# Patient Record
Sex: Female | Born: 1975 | Race: White | Hispanic: No | Marital: Single | Smoking: Former smoker
Health system: Southern US, Community
[De-identification: ages and names within clinical notes are randomized; demographics above are authoritative.]

## PROBLEM LIST (undated history)

## (undated) ENCOUNTER — Inpatient Hospital Stay (HOSPITAL_COMMUNITY): Payer: Self-pay

## (undated) DIAGNOSIS — N9989 Other postprocedural complications and disorders of genitourinary system: Secondary | ICD-10-CM

## (undated) DIAGNOSIS — J189 Pneumonia, unspecified organism: Secondary | ICD-10-CM

## (undated) DIAGNOSIS — E785 Hyperlipidemia, unspecified: Secondary | ICD-10-CM

## (undated) DIAGNOSIS — T8859XA Other complications of anesthesia, initial encounter: Secondary | ICD-10-CM

## (undated) DIAGNOSIS — F319 Bipolar disorder, unspecified: Secondary | ICD-10-CM

## (undated) DIAGNOSIS — F329 Major depressive disorder, single episode, unspecified: Secondary | ICD-10-CM

## (undated) DIAGNOSIS — D649 Anemia, unspecified: Secondary | ICD-10-CM

## (undated) DIAGNOSIS — F32A Depression, unspecified: Secondary | ICD-10-CM

## (undated) DIAGNOSIS — M1990 Unspecified osteoarthritis, unspecified site: Secondary | ICD-10-CM

## (undated) DIAGNOSIS — R338 Other retention of urine: Secondary | ICD-10-CM

## (undated) DIAGNOSIS — E669 Obesity, unspecified: Secondary | ICD-10-CM

## (undated) DIAGNOSIS — Z8719 Personal history of other diseases of the digestive system: Secondary | ICD-10-CM

## (undated) DIAGNOSIS — F419 Anxiety disorder, unspecified: Secondary | ICD-10-CM

## (undated) DIAGNOSIS — I10 Essential (primary) hypertension: Secondary | ICD-10-CM

## (undated) DIAGNOSIS — Z8489 Family history of other specified conditions: Secondary | ICD-10-CM

## (undated) DIAGNOSIS — I890 Lymphedema, not elsewhere classified: Secondary | ICD-10-CM

## (undated) DIAGNOSIS — R87629 Unspecified abnormal cytological findings in specimens from vagina: Secondary | ICD-10-CM

## (undated) DIAGNOSIS — O021 Missed abortion: Secondary | ICD-10-CM

## (undated) DIAGNOSIS — Z8619 Personal history of other infectious and parasitic diseases: Secondary | ICD-10-CM

## (undated) DIAGNOSIS — K219 Gastro-esophageal reflux disease without esophagitis: Secondary | ICD-10-CM

## (undated) DIAGNOSIS — G4730 Sleep apnea, unspecified: Secondary | ICD-10-CM

## (undated) DIAGNOSIS — R519 Headache, unspecified: Secondary | ICD-10-CM

## (undated) DIAGNOSIS — Z332 Encounter for elective termination of pregnancy: Secondary | ICD-10-CM

## (undated) DIAGNOSIS — T7840XA Allergy, unspecified, initial encounter: Secondary | ICD-10-CM

## (undated) HISTORY — DX: Depression, unspecified: F32.A

## (undated) HISTORY — DX: Hyperlipidemia, unspecified: E78.5

## (undated) HISTORY — DX: Anxiety disorder, unspecified: F41.9

## (undated) HISTORY — DX: Bipolar disorder, unspecified: F31.9

## (undated) HISTORY — PX: WISDOM TOOTH EXTRACTION: SHX21

## (undated) HISTORY — DX: Major depressive disorder, single episode, unspecified: F32.9

## (undated) HISTORY — DX: Allergy, unspecified, initial encounter: T78.40XA

## (undated) HISTORY — DX: Sleep apnea, unspecified: G47.30

## (undated) HISTORY — PX: DILATION AND CURETTAGE OF UTERUS: SHX78

## (undated) HISTORY — DX: Headache, unspecified: R51.9

---

## 2003-02-10 ENCOUNTER — Emergency Department (HOSPITAL_COMMUNITY): Admission: EM | Admit: 2003-02-10 | Discharge: 2003-02-10 | Payer: Self-pay | Admitting: Emergency Medicine

## 2003-03-23 ENCOUNTER — Ambulatory Visit (HOSPITAL_COMMUNITY): Admission: RE | Admit: 2003-03-23 | Discharge: 2003-03-23 | Payer: Self-pay | Admitting: Obstetrics and Gynecology

## 2003-03-23 ENCOUNTER — Encounter (INDEPENDENT_AMBULATORY_CARE_PROVIDER_SITE_OTHER): Payer: Self-pay | Admitting: *Deleted

## 2003-04-08 ENCOUNTER — Emergency Department (HOSPITAL_COMMUNITY): Admission: EM | Admit: 2003-04-08 | Discharge: 2003-04-08 | Payer: Self-pay | Admitting: Emergency Medicine

## 2003-06-08 ENCOUNTER — Other Ambulatory Visit: Admission: RE | Admit: 2003-06-08 | Discharge: 2003-06-08 | Payer: Self-pay | Admitting: Obstetrics and Gynecology

## 2003-07-23 ENCOUNTER — Other Ambulatory Visit: Admission: RE | Admit: 2003-07-23 | Discharge: 2003-07-23 | Payer: Self-pay | Admitting: Obstetrics and Gynecology

## 2003-10-23 ENCOUNTER — Other Ambulatory Visit: Admission: RE | Admit: 2003-10-23 | Discharge: 2003-10-23 | Payer: Self-pay | Admitting: Obstetrics and Gynecology

## 2004-03-22 ENCOUNTER — Encounter (INDEPENDENT_AMBULATORY_CARE_PROVIDER_SITE_OTHER): Payer: Self-pay | Admitting: *Deleted

## 2004-03-22 ENCOUNTER — Ambulatory Visit (HOSPITAL_COMMUNITY): Admission: RE | Admit: 2004-03-22 | Discharge: 2004-03-22 | Payer: Self-pay | Admitting: Obstetrics and Gynecology

## 2004-03-22 DIAGNOSIS — Z332 Encounter for elective termination of pregnancy: Secondary | ICD-10-CM

## 2004-03-22 HISTORY — DX: Encounter for elective termination of pregnancy: Z33.2

## 2004-08-09 ENCOUNTER — Emergency Department (HOSPITAL_COMMUNITY): Admission: EM | Admit: 2004-08-09 | Discharge: 2004-08-09 | Payer: Self-pay | Admitting: Emergency Medicine

## 2004-08-11 ENCOUNTER — Ambulatory Visit (HOSPITAL_COMMUNITY): Admission: RE | Admit: 2004-08-11 | Discharge: 2004-08-11 | Payer: Self-pay

## 2004-08-14 ENCOUNTER — Ambulatory Visit (HOSPITAL_COMMUNITY): Admission: RE | Admit: 2004-08-14 | Discharge: 2004-08-14 | Payer: Self-pay | Admitting: Neurology

## 2004-08-19 ENCOUNTER — Ambulatory Visit (HOSPITAL_COMMUNITY): Admission: RE | Admit: 2004-08-19 | Discharge: 2004-08-19 | Payer: Self-pay | Admitting: Neurology

## 2004-08-20 ENCOUNTER — Ambulatory Visit (HOSPITAL_COMMUNITY): Admission: RE | Admit: 2004-08-20 | Discharge: 2004-08-20 | Payer: Self-pay | Admitting: Neurology

## 2005-03-20 ENCOUNTER — Ambulatory Visit (HOSPITAL_COMMUNITY): Admission: RE | Admit: 2005-03-20 | Discharge: 2005-03-20 | Payer: Self-pay | Admitting: Neurology

## 2005-04-14 ENCOUNTER — Encounter: Admission: RE | Admit: 2005-04-14 | Discharge: 2005-04-14 | Payer: Self-pay | Admitting: Neurology

## 2005-10-15 ENCOUNTER — Inpatient Hospital Stay (HOSPITAL_COMMUNITY): Admission: AD | Admit: 2005-10-15 | Discharge: 2005-10-15 | Payer: Self-pay | Admitting: Obstetrics and Gynecology

## 2006-04-06 ENCOUNTER — Emergency Department (HOSPITAL_COMMUNITY): Admission: EM | Admit: 2006-04-06 | Discharge: 2006-04-06 | Payer: Self-pay | Admitting: Emergency Medicine

## 2006-08-26 ENCOUNTER — Ambulatory Visit: Payer: Self-pay | Admitting: Family Medicine

## 2006-10-04 ENCOUNTER — Ambulatory Visit: Payer: Self-pay | Admitting: Family Medicine

## 2007-04-25 ENCOUNTER — Emergency Department (HOSPITAL_COMMUNITY): Admission: EM | Admit: 2007-04-25 | Discharge: 2007-04-26 | Payer: Self-pay | Admitting: Emergency Medicine

## 2008-05-01 ENCOUNTER — Encounter: Admission: RE | Admit: 2008-05-01 | Discharge: 2008-05-01 | Payer: Self-pay | Admitting: Obstetrics and Gynecology

## 2008-08-01 ENCOUNTER — Encounter: Admission: RE | Admit: 2008-08-01 | Discharge: 2008-10-30 | Payer: Self-pay | Admitting: Family Medicine

## 2008-11-13 ENCOUNTER — Encounter: Admission: RE | Admit: 2008-11-13 | Discharge: 2008-11-13 | Payer: Self-pay | Admitting: Family Medicine

## 2009-02-13 ENCOUNTER — Ambulatory Visit (HOSPITAL_COMMUNITY): Admission: RE | Admit: 2009-02-13 | Discharge: 2009-02-13 | Payer: Self-pay | Admitting: Obstetrics and Gynecology

## 2009-02-13 ENCOUNTER — Encounter (INDEPENDENT_AMBULATORY_CARE_PROVIDER_SITE_OTHER): Payer: Self-pay | Admitting: Obstetrics and Gynecology

## 2009-06-14 ENCOUNTER — Emergency Department (HOSPITAL_COMMUNITY): Admission: EM | Admit: 2009-06-14 | Discharge: 2009-06-14 | Payer: Self-pay | Admitting: Emergency Medicine

## 2009-10-29 ENCOUNTER — Ambulatory Visit (HOSPITAL_COMMUNITY): Admission: RE | Admit: 2009-10-29 | Discharge: 2009-10-29 | Payer: Self-pay | Admitting: Obstetrics and Gynecology

## 2009-11-16 ENCOUNTER — Emergency Department (HOSPITAL_COMMUNITY): Admission: EM | Admit: 2009-11-16 | Discharge: 2009-11-16 | Payer: Self-pay | Admitting: Emergency Medicine

## 2009-11-26 ENCOUNTER — Ambulatory Visit (HOSPITAL_COMMUNITY)
Admission: RE | Admit: 2009-11-26 | Discharge: 2009-11-26 | Payer: Self-pay | Source: Ambulatory Visit | Admitting: Obstetrics and Gynecology

## 2010-01-02 ENCOUNTER — Ambulatory Visit: Payer: Self-pay | Admitting: Internal Medicine

## 2010-01-02 ENCOUNTER — Ambulatory Visit: Payer: Self-pay | Admitting: Pulmonary Disease

## 2010-01-02 DIAGNOSIS — R05 Cough: Secondary | ICD-10-CM

## 2010-01-02 DIAGNOSIS — R059 Cough, unspecified: Secondary | ICD-10-CM | POA: Insufficient documentation

## 2010-01-10 ENCOUNTER — Telehealth (INDEPENDENT_AMBULATORY_CARE_PROVIDER_SITE_OTHER): Payer: Self-pay | Admitting: *Deleted

## 2010-01-12 ENCOUNTER — Inpatient Hospital Stay (HOSPITAL_COMMUNITY): Admission: AD | Admit: 2010-01-12 | Discharge: 2010-01-13 | Payer: Self-pay | Admitting: Obstetrics & Gynecology

## 2010-01-12 DIAGNOSIS — O368190 Decreased fetal movements, unspecified trimester, not applicable or unspecified: Secondary | ICD-10-CM

## 2010-04-07 ENCOUNTER — Inpatient Hospital Stay (HOSPITAL_COMMUNITY): Admission: AD | Admit: 2010-04-07 | Discharge: 2010-04-11 | Payer: Self-pay | Admitting: Obstetrics and Gynecology

## 2010-04-08 ENCOUNTER — Encounter (INDEPENDENT_AMBULATORY_CARE_PROVIDER_SITE_OTHER): Payer: Self-pay | Admitting: Obstetrics and Gynecology

## 2010-04-11 ENCOUNTER — Encounter: Admission: RE | Admit: 2010-04-11 | Discharge: 2010-05-11 | Payer: Self-pay | Admitting: Obstetrics and Gynecology

## 2010-05-12 ENCOUNTER — Encounter
Admission: RE | Admit: 2010-05-12 | Discharge: 2010-06-11 | Payer: Self-pay | Source: Home / Self Care | Attending: Obstetrics and Gynecology | Admitting: Obstetrics and Gynecology

## 2010-06-12 ENCOUNTER — Encounter
Admission: RE | Admit: 2010-06-12 | Discharge: 2010-07-12 | Payer: Self-pay | Source: Home / Self Care | Attending: Obstetrics and Gynecology | Admitting: Obstetrics and Gynecology

## 2010-07-13 ENCOUNTER — Encounter
Admission: RE | Admit: 2010-07-13 | Discharge: 2010-07-29 | Payer: Self-pay | Source: Home / Self Care | Attending: Obstetrics and Gynecology | Admitting: Obstetrics and Gynecology

## 2010-07-29 NOTE — Assessment & Plan Note (Signed)
Summary: consult for chronic cough   Copy to:  Dr Lavada Mesi Primary Provider/Referring Provider:  Hilts  CC:  Cons for recurring resp infections -  Pt is [redacted] weeks pregnant - Symptoms started with sinus infection in 07/2009 - Treated for bronchitis then pneumonia in June - Had  round of zpak and omnicef - on day 8 of 2nd round of Omnicef - Non prod cough - Pain in both lungs - worse in left lung - pain goes through to back on left side - pain worse with cough and deep breathe - SOB with exertion and rest.  History of Present Illness: The pt is a 35y/o female who comes in today for evaluation of persistent pulmonary symptoms.  She is currently [redacted] weeks pregnant, and was in her usual state of health until Feb. of this year when she began to have sinus pressure and congestion, but no purulence.  She did not want to take abx because of her pregnancy, but weeks later continued to have issues.  She was put on amoxacillin, followed by a zpack.  She had a cxr that showed no infiltrate, and really no acute process.  Since that time, she has had persistent nasal congestion and pressure, chest tightness, cough with scant mucus, and some sob.  She has been seen by her primary md, and has failed to respond to multiple rounds of abx including omnicef and zpak.  Last week she was treated with a course of prednisone with minimal improvement.  Currently, she continues to have dry cough with intermittant chest discomfort.  She also has ongoing nasal congestion, and can blow scant discolored appearing mucus from her nares.  She is not producing a lot of mucus from her chest.  The pt also has a h/o reflux, and this has worsened during pregnancy.  She denies any significant LE edema, calf pain, and does not describe classic pleuritic chest pain.  Current Medications (verified): 1)  Viteyes Omega-3 200-300-5 Mg-Mg-Unit Caps (Dha-Epa-Vitamin E) .... Take One By Mouth Once Daily 2)  Prednisone 10 Mg Tabs (Prednisone) ....  Taper As Directed 3)  Prenatal Plus/iron 27-1 Mg Tabs (Prenatal Vit-Fe Fumarate-Fa) .... Take One By Mouth Once Daily 4)  Cefdinir 300 Mg Caps (Cefdinir) .... Take One By Mouth Two Times A Day Until Gone 5)  Cyclobenzaprine Hcl 5 Mg Tabs (Cyclobenzaprine Hcl) .... Take On By Mouth Once Daily As Needed 6)  Proair Hfa 108 (90 Base) Mcg/act Aers (Albuterol Sulfate) .... Take 2 Puffs Every 4 Hrs As Needed  Allergies (verified): No Known Drug Allergies  Past History:  Past Medical History: none per pt  Past Surgical History: none per pt  Family History: Reviewed history and no changes required. Father - chronic bronchitis Father - heart disease Mother - Melanoma Father - pancreatic cancer  Social History: Reviewed history and no changes required. Quit smoking 08/2008 Smoked for 10 yrs - 1 ppd Married - lives with domestic partner Occupation - Airline pilot  Review of Systems       The patient complains of shortness of breath with activity, shortness of breath at rest, productive cough, non-productive cough, chest pain, acid heartburn, indigestion, weight change, sore throat, nasal congestion/difficulty breathing through nose, sneezing, ear ache, and hand/feet swelling.  The patient denies coughing up blood, irregular heartbeats, loss of appetite, abdominal pain, difficulty swallowing, tooth/dental problems, headaches, itching, anxiety, depression, joint stiffness or pain, rash, change in color of mucus, and fever.    Vital Signs:  Patient profile:  35 year old female Height:      69 inches Weight:      339 pounds BMI:     50.24 O2 Sat:      96 % on Room air Temp:     98.1 degrees F oral Pulse rate:   95 / minute BP sitting:   136 / 68  (right arm) Cuff size:   large  Vitals Entered By: Abigail Miyamoto RN (January 02, 2010 2:29 PM)  O2 Flow:  Room air  Physical Exam  General:  morbidly obese female in nad Eyes:  PERRLA and EOMI.  PERRLA and EOMI.   Nose:  patent with mildy  inflammed mucosa, no purulence or discharge noted. Mouth:  clear with no lesions or exudates. Neck:  no jvd, tmg, LN Lungs:  totally clear to auscultation no wheezing or rhonchi Heart:  rrr, 2/6 sem, no rubs or gallops Abdomen:  pregnant abdomen, no tenderness, bs+ Extremities:  1+ edema bilat, no cyanosis pulses intact distally Neurologic:  alert and oriented, moves all 4.   Impression & Recommendations:  Problem # 1:  COUGH (ICD-786.2) the pt has persistent cough despite multiple rounds of abx and most recently a course of prednisone.  Her spirometry today is totally normal despite ongoing pulmonary symptoms.  I suspect her issue is either chronic sinus disease, which can be difficult to resolve, vs an upper airway cough that is cyclical in nature.  This is unlikely to be a pulmonary infection after 3-4 courses of abx unless she does indeed have severe chronic sinusitis.  This is a very difficult situation for the pt given her current pregnancy.  If this is an upper airway cough, it is usually treated with aggressive medication for postnasal drip, reflux, and heavy cough suppression.  She is not able to be on narcotics, PPI, or a lot of rhinitis medications.  She would be able to do behavioral therapies that  are known to help cyclical cough.  At this time, would like to do ct of her sinuses, and if clear, treat her as able for a cyclical cough.    Medications Added to Medication List This Visit: 1)  Viteyes Omega-3 200-300-5 Mg-mg-unit Caps (Dha-epa-vitamin e) .... Take one by mouth once daily 2)  Prednisone 10 Mg Tabs (Prednisone) .... Taper as directed 3)  Prenatal Plus/iron 27-1 Mg Tabs (Prenatal vit-fe fumarate-fa) .... Take one by mouth once daily 4)  Cefdinir 300 Mg Caps (Cefdinir) .... Take one by mouth two times a day until gone 5)  Cyclobenzaprine Hcl 5 Mg Tabs (Cyclobenzaprine hcl) .... Take on by mouth once daily as needed 6)  Proair Hfa 108 (90 Base) Mcg/act Aers (Albuterol  sulfate) .... Take 2 puffs every 4 hrs as needed  Other Orders: Consultation Level V (84132) Spirometry w/Graph (44010) Radiology Referral (Radiology)  Patient Instructions: 1)  will check scan of your sinuses to evaluate for chronic sinusitis. 2)  neil med sinus rinses am and pm 3)  will call you with results of scan, and can arrange future followup at that time.

## 2010-07-29 NOTE — Progress Notes (Signed)
Summary: status  Phone Note Call from Patient Call back at Home Phone [phone]   Caller: Patient Call For: clance Reason for Call: Talk to Nurse Summary of Call: pt still on Prednisone, still have some cough, but it is breaking up and she is getting stuff out.  Still have issue with sore throat and voice is usually gone by the end of day.  Sputem is bright yellow opposed to green.  Finished antibiotics 07/13. Initial call taken by: Eugene Gavia,  January 10, 2010 8:38 AM  Follow-up for Phone Call        called and spoke with pt. pt calling to give update to [facility].  Pt states she feels better than last week.  Pt states she is still on Prednisone taper (has a few days left)  Pt states sputum she is coughing up has now changed from green to a bright yellow.  Pt c/o "very sore throat."  And also increased heartburn but unsure if this is d/t the pregnancy.  Pt states she is using sinus rinses two times a day and also limiting voice use and keeping hard candy in her mouth throughout the day.  I informed pt KC out of office until next Tuesday July 19.  Offered pt an appt with TP or another physician in the office.  Pt declined stating she just wanted to give [facility] the update and will wait to see what his recs are when he returns to the office.  Pt did state though, that if her symptoms worsened she would call back.  Will forward message to [facility].  Aundra Millet Reynolds LPN  January 10, 2010 9:00 AM   Additional Follow-up for Phone Call Additional follow up Details #1::        Is she really coughing up a lot of mucus?  if she is, needs to come by and get sputum cup to send for culture.  She has been on 4-5 rounds of abx, and nothing usual would live thru that.  Remind her that I think a lot of her symptoms are from the upper airway, and with her pregnancy there isn't a lot we can do.  She can find out from gyn what she can take for reflux, postnasal drip.  She needs to continue with hard candy, and limit voice use.  Let's  see how she does over coming weeks.  Needs ov if she gets worse. Additional Follow-up by: Barbaraann Share MD,  January 15, 2010 4:07 PM    Additional Follow-up for Phone Call Additional follow up Details #2::    called spoke with patient and advised her of KC's recs.  patient states that she is able to produce about a tablespoon of mucus when she coughs, however she does not want to leave a sputum specimen for culture.  pt verbalized her understanding of KC's recs and states that she will call if she worsens for an OV.   Follow-up by: Boone Master CNA/MA,  January 15, 2010 4:22 PM

## 2010-09-10 LAB — COMPREHENSIVE METABOLIC PANEL
ALT: 14 U/L (ref 0–35)
AST: 18 U/L (ref 0–37)
Albumin: 3.2 g/dL — ABNORMAL LOW (ref 3.5–5.2)
Alkaline Phosphatase: 82 U/L (ref 39–117)
BUN: 4 mg/dL — ABNORMAL LOW (ref 6–23)
CO2: 21 mEq/L (ref 19–32)
Calcium: 9.5 mg/dL (ref 8.4–10.5)
Chloride: 106 mEq/L (ref 96–112)
Creatinine, Ser: 0.59 mg/dL (ref 0.4–1.2)
GFR calc Af Amer: >60 mL/min (ref >60)
GFR calc non Af Amer: >60 mL/min (ref >60)
Glucose, Bld: 82 mg/dL (ref 70–99)
Potassium: 3.9 mEq/L (ref 3.5–5.1)
Sodium: 137 mEq/L (ref 135–145)
Total Bilirubin: 0.5 mg/dL (ref 0.3–1.2)
Total Protein: 6.1 g/dL (ref 6.0–8.3)

## 2010-09-10 LAB — LACTATE DEHYDROGENASE: LDH: 124 U/L (ref 94–250)

## 2010-09-10 LAB — CBC
HCT: 37.3 % (ref 36.0–46.0)
Hemoglobin: 10.4 g/dL — ABNORMAL LOW (ref 12.0–15.0)
Hemoglobin: 12.7 g/dL (ref 12.0–15.0)
MCH: 30.8 pg (ref 26.0–34.0)
MCV: 89.8 fL (ref 78.0–100.0)
Platelets: 198 10*3/uL (ref 150–400)
RBC: 3.39 MIL/uL — ABNORMAL LOW (ref 3.87–5.11)
RBC: 4.17 MIL/uL (ref 3.87–5.11)
RDW: 14.4 % (ref 11.5–15.5)
WBC: 15.3 10*3/uL — ABNORMAL HIGH (ref 4.0–10.5)
WBC: 9.1 10*3/uL (ref 4.0–10.5)

## 2010-09-10 LAB — RPR: RPR Ser Ql: NONREACTIVE

## 2010-09-10 LAB — URIC ACID: Uric Acid, Serum: 4 mg/dL (ref 2.4–7.0)

## 2010-09-13 LAB — URINALYSIS, ROUTINE W REFLEX MICROSCOPIC
Bilirubin Urine: NEGATIVE
Glucose, UA: NEGATIVE mg/dL
Ketones, ur: NEGATIVE mg/dL
Nitrite: NEGATIVE
Specific Gravity, Urine: 1.015 (ref 1.005–1.030)
pH: 6.5 (ref 5.0–8.0)

## 2010-10-04 LAB — CBC
HCT: 41.6 % (ref 36.0–46.0)
Hemoglobin: 14.1 g/dL (ref 12.0–15.0)
RBC: 4.56 MIL/uL (ref 3.87–5.11)
RDW: 13.5 % (ref 11.5–15.5)

## 2010-11-11 NOTE — Op Note (Signed)
NAME:  Suzanne Wilcox, Suzanne Wilcox         ACCOUNT NO.:  [id number]   MEDICAL RECORD NO.:  [id number]          PATIENT TYPE:  AMB   LOCATION:  SDC                           FACILITY:  [facility]   PHYSICIAN:  Lenoard Aden, M.D.DATE OF BIRTH:  [birth year]   DATE OF PROCEDURE:  02/13/2009  DATE OF DISCHARGE:  02/13/2009                               OPERATIVE REPORT   PREOPERATIVE DIAGNOSIS:  Missed abortion, recurrent pregnancy loss.   POSTOPERATIVE DIAGNOSIS:  Missed abortion, recurrent pregnancy loss.   PROCEDURE:  Suction D and E and tissue for chromosome.   SURGEON:  Lenoard Aden, MD   ANESTHESIA:  MAC paracervical.   ESTIMATED BLOOD LOSS:  50 mL.   COMPLICATIONS:  None.   DRAINS:  None.   COUNTS:  Correct.   The patient taken to recovery in good condition.   BRIEF OPERATIVE NOTE:  After being apprised of the risks of anesthesia,  infection, bleeding, injury to abdominal organs, need for repair,  delayed versus immediate complications related to uterine perforation  with possible need for repair, the patient brought to the operating room  where she was administered an IV sedation and without difficulty prepped  and draped in usual sterile fashion, catheterized until the bladder was  empty.  Exam under anesthesia reveals a bulky anteflexed uterus and no  adnexal masses.  Dilute paracervical block with 2% Xylocaine solution  was placed.  Cervix was then easily dilated up to #25 [facility] dilator.  An  8-mm curved suction curette placed.  Products of conception were  aspirated without difficulty.  Repeat suction and curettage in a four-  quadrant method reveals cavity to be empty.  Tissue was inspected and  found to be consistent with POC.  Good hemostasis was noted.  All  instruments were removed.  The patient tolerated the procedure well and  transferred to recovery.  Tissue was also collected for chromosomal  analysis and sent to pathology.      Lenoard Aden, M.D.  Electronically Signed     RJT/MEDQ  D:  02/13/2009  T:  02/14/2009  Job:  [id number]

## 2010-11-14 NOTE — H&P (Signed)
NAMEMarland Wilcox  SHANEQUE, MERKLE NO.:  [id number]   MEDICAL RECORD NO.:  [id number]           PATIENT TYPE:   LOCATION:                                 FACILITY:   PHYSICIAN:  Lenoard Aden, M.D.     DATE OF BIRTH:   DATE OF ADMISSION:  03/21/2004  DATE OF DISCHARGE:                                HISTORY & PHYSICAL   CHIEF COMPLAINT:  Desire for elective termination.   HISTORY OF PRESENT ILLNESS:  A 35 year old white female, G2, P0, with a  history of missed AB with known viable intrauterine pregnancy at 7 plus  weeks gestation for elective termination of pregnancy.  She has a history of  abnormal Pap smear with LEEP in 2004.  She has a history of a missed AB  without D&E.   FAMILY HISTORY:  Remarkable for heart disease, hypertension, skin cancer.   SOCIAL HISTORY:  She is a smoker.  She denies domestic or physical violence.   GYNECOLOGIC HISTORY:  Remarkable for a history of PID and gonorrhea.   PHYSICAL EXAMINATION:  GENERAL:  She is a well-developed, well-nourished  obese white female in no acute distress.  HEENT:  Normal.  LUNGS:  Clear.  HEART:  Regular rhythm.  ABDOMEN:  Soft, obese, and nontender.  PELVIC:  Uterus is anteflexed.  No adnexal masses appreciated.   IMPRESSION:  A 7-week intrauterine pregnancy for elective termination of  pregnancy.   PLAN:  Proceed with a D&E.  Risks of anesthesia, infection, bleeding, injury  to abdominal organs with the need for repair was discussed, as well as  complications to include bowel and bladder injury, resultant of uterine  perforation discussed.  The patient acknowledges and wishes to proceed.       ___________________________________________  Lenoard Aden, M.D.    RJT/MEDQ  D:  03/21/2004  T:  03/22/2004  Job:  [id number]   cc:   Ma Hillock

## 2011-04-08 LAB — COMPREHENSIVE METABOLIC PANEL
ALT: 21
AST: 24
Alkaline Phosphatase: 43
CO2: 23
Calcium: 8.8
Chloride: 102
GFR calc Af Amer: >60
GFR calc non Af Amer: >60
Glucose, Bld: 90
Potassium: 3.5
Sodium: 136
Total Bilirubin: 0.4

## 2011-04-08 LAB — CBC
Hemoglobin: 15.4 — ABNORMAL HIGH
RBC: 5.13 — ABNORMAL HIGH
WBC: 9.9

## 2011-04-08 LAB — DIFFERENTIAL
Basophils Relative: 0
Eosinophils Absolute: 0.1
Eosinophils Relative: 1
Lymphs Abs: 1.1
Neutrophils Relative %: 86 — ABNORMAL HIGH

## 2011-04-08 LAB — URINALYSIS, ROUTINE W REFLEX MICROSCOPIC
Glucose, UA: NEGATIVE
Hgb urine dipstick: NEGATIVE
pH: 6.5

## 2011-04-08 LAB — POCT PREGNANCY, URINE
Operator id: 288831
Preg Test, Ur: NEGATIVE

## 2011-04-08 LAB — LIPASE, BLOOD: Lipase: 21

## 2011-04-08 LAB — WET PREP, GENITAL: Trich, Wet Prep: NONE SEEN

## 2011-12-11 ENCOUNTER — Other Ambulatory Visit: Payer: Self-pay | Admitting: Neurology

## 2012-04-29 ENCOUNTER — Encounter (HOSPITAL_COMMUNITY): Payer: Self-pay | Admitting: *Deleted

## 2012-04-29 ENCOUNTER — Ambulatory Visit (HOSPITAL_COMMUNITY)
Admission: RE | Admit: 2012-04-29 | Discharge: 2012-04-29 | Disposition: A | Discharge status: Home / Self Care | Payer: BC Managed Care – PPO | Attending: Psychiatry | Admitting: Psychiatry

## 2012-04-29 DIAGNOSIS — E669 Obesity, unspecified: Secondary | ICD-10-CM

## 2012-04-29 DIAGNOSIS — F4001 Agoraphobia with panic disorder: Secondary | ICD-10-CM | POA: Insufficient documentation

## 2012-04-29 DIAGNOSIS — F39 Unspecified mood [affective] disorder: Secondary | ICD-10-CM | POA: Insufficient documentation

## 2012-04-29 HISTORY — DX: Obesity, unspecified: E66.9

## 2012-04-29 NOTE — H&P (Signed)
Behavioral Health Medical Screening Exam  Suzanne Wilcox is an 36 y.o. female.  Review of Systems  Constitutional: Negative.   HENT: Positive for tinnitus (Hx of ringing in ears states it goes and comes). Negative for ear pain, nosebleeds and sore throat.   Eyes: Negative.   Respiratory: Negative.   Cardiovascular: Negative.   Gastrointestinal: Negative.   Genitourinary: Negative.   Musculoskeletal: Negative.   Skin: Negative.   Neurological: Negative.   Endo/Heme/Allergies: Negative.   Psychiatric/Behavioral: Positive for depression (Long history or depression rates 7-8/10.  Able to contract for safety.  ) and hallucinations (States that she has not had and hallucinations since she started her antipsychcotic medications ). Negative for suicidal ideas, memory loss and substance abuse. The patient is nervous/anxious (Rates 5/10). The patient does not have insomnia.     Physical Exam  Constitutional: She is oriented to person, place, and time. She appears well-developed and well-nourished. No distress.  Eyes: Pupils are equal, round, and reactive to light.  Neck: Normal range of motion.  Cardiovascular: Normal rate, regular rhythm, normal heart sounds and intact distal pulses.  Exam reveals no gallop and no friction rub.   No murmur heard. Respiratory: Effort normal and breath sounds normal. No respiratory distress. She has no wheezes. She has no rales. She exhibits no tenderness.  GI: Soft. Bowel sounds are normal.  Musculoskeletal: Normal range of motion.  Neurological: She is alert and oriented to person, place, and time.  Skin: Skin is warm and dry.  Psychiatric:       Affect: flat, behavior: sad, depressed, Thought: fair, Judgment: fair    There were no vitals taken for this visit.  Recommendations:  Based on my evaluation the patient does not appear to have an emergency medical condition. Patient signed contract for safety.  Referral and resource material  given.  Rankin, Shuvon 04/29/2012, 2:02 PM
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
## 2012-05-02 NOTE — H&P (Signed)
Agree with the screening 

## 2012-05-04 ENCOUNTER — Other Ambulatory Visit (HOSPITAL_COMMUNITY): Payer: BC Managed Care – PPO | Attending: Psychiatry

## 2012-05-04 ENCOUNTER — Encounter (HOSPITAL_COMMUNITY): Payer: Self-pay

## 2012-05-04 DIAGNOSIS — F1921 Other psychoactive substance dependence, in remission: Secondary | ICD-10-CM | POA: Insufficient documentation

## 2012-05-04 DIAGNOSIS — F3132 Bipolar disorder, current episode depressed, moderate: Secondary | ICD-10-CM

## 2012-05-04 DIAGNOSIS — F3110 Bipolar disorder, current episode manic without psychotic features, unspecified: Secondary | ICD-10-CM | POA: Insufficient documentation

## 2012-05-04 DIAGNOSIS — F411 Generalized anxiety disorder: Secondary | ICD-10-CM | POA: Insufficient documentation

## 2012-05-04 DIAGNOSIS — E669 Obesity, unspecified: Secondary | ICD-10-CM | POA: Insufficient documentation

## 2012-05-04 DIAGNOSIS — F429 Obsessive-compulsive disorder, unspecified: Secondary | ICD-10-CM | POA: Insufficient documentation

## 2012-05-05 ENCOUNTER — Other Ambulatory Visit (HOSPITAL_COMMUNITY): Payer: BC Managed Care – PPO

## 2012-05-06 ENCOUNTER — Other Ambulatory Visit (HOSPITAL_COMMUNITY): Payer: BC Managed Care – PPO
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
## 2012-05-06 NOTE — Progress Notes (Unsigned)
    Daily Group Progress Note  Program: IOP  Group Time: 9:00-10:30 am   Participation Level: Active  Behavioral Response: Appropriate  Type of Therapy:  Process Group  Summary of Progress: Summary: Patient reported feeling a lot of support from family and friends but she is aware that she pushes people away. Patient feels that she deserves her diagnoses because of the things she has done in her past. Patient is struggling with her depression and guilt from past decisions.      Group Time: 10:30 am - 12:00 pm   Participation Level:  Active  Behavioral Response: Appropriate  Type of Therapy: Psycho-education Group  Summary of Progress: Patient participated in muscle relaxation meditation.   Maxcine Ham, MSW, LCSW

## 2012-05-06 NOTE — Progress Notes (Unsigned)
    Daily Group Progress Note  Program: IOP  Group Time: 9:00-10:30 am   Participation Level: Minimal  Behavioral Response: Resistant  Type of Therapy:  Process Group  Summary of Progress: Patient was asked if she wanted to share and she chose to pass. She sat with her hand on her forehead and appeared tearful, but did not share how she was feeling other than giving a thumbs down during the nonverbal check in, indicating high depression. She has yet to share her main stressors with the group.      Group Time: 10:30 am - 12:00 pm   Participation Level:  Minimal  Behavioral Response: Resistant  Type of Therapy: Psycho-education Group  Summary of Progress: Patient learned about the "change cycle" and the process people go through when contemplating making a change. Patient related this to their main reason for entering treatment and identified their current place on the change cycle.   Maxcine Ham, MSW, LCSW

## 2012-05-09 ENCOUNTER — Other Ambulatory Visit (HOSPITAL_COMMUNITY): Payer: BC Managed Care – PPO

## 2012-05-10 ENCOUNTER — Other Ambulatory Visit (HOSPITAL_COMMUNITY): Payer: BC Managed Care – PPO | Admitting: Psychiatry

## 2012-05-10 DIAGNOSIS — F314 Bipolar disorder, current episode depressed, severe, without psychotic features: Secondary | ICD-10-CM

## 2012-05-10 DIAGNOSIS — F1910 Other psychoactive substance abuse, uncomplicated: Secondary | ICD-10-CM

## 2012-05-10 DIAGNOSIS — F429 Obsessive-compulsive disorder, unspecified: Secondary | ICD-10-CM

## 2012-05-10 NOTE — Progress Notes (Signed)
Patient ID: Suzanne Wilcox, female   DOB: August 15, 1975, 36 y.o.   MRN: [id number] D:  Pt inquired if she can start taking one whole tablet of the Remeron instead of half.  A:  Placed call to Dr. Rutherford Limerick and she states pt could.  R:  Pt receptive.

## 2012-05-11 ENCOUNTER — Other Ambulatory Visit (HOSPITAL_COMMUNITY): Payer: BC Managed Care – PPO | Admitting: Psychiatry

## 2012-05-11 DIAGNOSIS — F319 Bipolar disorder, unspecified: Secondary | ICD-10-CM | POA: Insufficient documentation

## 2012-05-11 DIAGNOSIS — F429 Obsessive-compulsive disorder, unspecified: Secondary | ICD-10-CM | POA: Insufficient documentation

## 2012-05-11 DIAGNOSIS — F314 Bipolar disorder, current episode depressed, severe, without psychotic features: Secondary | ICD-10-CM | POA: Insufficient documentation

## 2012-05-11 DIAGNOSIS — F1910 Other psychoactive substance abuse, uncomplicated: Secondary | ICD-10-CM | POA: Insufficient documentation
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
## 2012-05-11 NOTE — Progress Notes (Signed)
    Daily Group Progress Note  Program: IOP  Group Time: 9:00-10:30 am   Participation Level: Minimal  Behavioral Response: Appropriate  Type of Therapy:  Process Group  Summary of Progress: Patient reports feeling "very tired today from the new medication the doctor prescribed". She presented with dishelved appearance, uncombed hair and had her eyes closed during group. She responded well when redirected to open her eyes and appear attentive. She states she is just really tired today.      Group Time: 10:30 am - 12:00 pm   Participation Level:  Minimal  Behavioral Response: Appropriate  Type of Therapy: Psycho-education Group  Summary of Progress: Patient was educated on anxiety and bipolar disorder to better understand the symptoms and how to identify them when they are occuring.   Carman Ching, LCSW

## 2012-05-11 NOTE — Progress Notes (Signed)
    Daily Group Progress Note  Program: IOP  Group Time: 9:00-10:30 am   Participation Level: Active  Behavioral Response: Appropriate  Type of Therapy:  Process Group  Summary of Progress: Patient was called upon to share because group members noticed that she looked depressed and has not shared in several days. She said "I don't want to be here". She said she is only attending because she was referred by her therapist. Patient talks of herself as she is her diagnosis and said "I am diagnosed with Borderline Personality Disorder and Social anxiety Disorder". She said she does not like being in groups. With support, she was able to share her main stressors of feeling like she does not have support due to her husband having a cognitive disability and how this makes her feel like she is parenting three children, with her husband included. She lacks support and feels overwhelmed. She also appeared agitated when talking about her symptoms.      Group Time: 10:30 am - 12:00 pm   Participation Level:  Active  Behavioral Response: Appropriate  Type of Therapy: Psycho-education Group  Summary of Progress: Patient learned about the symptoms of depression as a clinical illness and discussed personal symptoms they are experiencing and how to become aware of them in case they return.  Carman Ching, LCSW

## 2012-05-12 ENCOUNTER — Other Ambulatory Visit (HOSPITAL_COMMUNITY): Payer: BC Managed Care – PPO | Admitting: Psychiatry

## 2012-05-12 DIAGNOSIS — F429 Obsessive-compulsive disorder, unspecified: Secondary | ICD-10-CM

## 2012-05-12 DIAGNOSIS — F314 Bipolar disorder, current episode depressed, severe, without psychotic features: Secondary | ICD-10-CM

## 2012-05-13 ENCOUNTER — Other Ambulatory Visit (HOSPITAL_COMMUNITY): Payer: BC Managed Care – PPO | Admitting: Psychiatry

## 2012-05-13 DIAGNOSIS — F314 Bipolar disorder, current episode depressed, severe, without psychotic features: Secondary | ICD-10-CM

## 2012-05-13 DIAGNOSIS — F429 Obsessive-compulsive disorder, unspecified: Secondary | ICD-10-CM

## 2012-05-13 MED ORDER — MIRTAZAPINE 15 MG PO TABS: 15.0000 mg | ORAL_TABLET | Freq: Every day | ORAL | Status: DC

## 2012-05-13 NOTE — Progress Notes (Signed)
    Daily Group Progress Note  Program: IOP  Group Time: 9:00-10:30 am   Participation Level: Minimal  Behavioral Response: Appropriate  Type of Therapy:  Process Group  Summary of Progress: Patient reports feeling moderate anxiety and depression today. She states she continues to feel "exhausted" and struggled to keep her eyes open during group. She sat with her head in her hands. Her appearance was di shelved and her hair was uncombed. She questions if the medications are causing the extreme fatigue.      Group Time: 10:30 am - 12:00 pm   Participation Level:  Active  Behavioral Response: Appropriate  Type of Therapy: Psycho-education Group  Summary of Progress: Patient learned the science behind aromatherapy as a tool to manage stress and depression as well as how to use the scents in the room at home and in work settings for continued stress management.   Carman Ching, LCSW

## 2012-05-13 NOTE — Progress Notes (Signed)
    Daily Group Progress Note  Program: IOP  Group Time: 9:00-10:30 am   Participation Level: Minimal  Behavioral Response: Drowsy  Type of Therapy:  Process Group  Summary of Progress: Patient presented again today with di shelved affect and uncombed hair. She appeared tired again today and requested to "not do the relaxation because it makes her tired". After relaxation, five minutes into the group starting, she requested to step out of the room briefly to get some "fresh air" and never returned.      Group Time: 10:30 am - 12:00 pm    Participation Level:  Minimal  Behavioral Response: Drowsy  Type of Therapy: Psycho-education Group  Summary of Progress: Patient participated in a goodbye ceremony and said goodbye to members leaving.   Carman Ching, LCSW

## 2012-05-16 ENCOUNTER — Other Ambulatory Visit (HOSPITAL_COMMUNITY): Payer: BC Managed Care – PPO | Admitting: Psychiatry

## 2012-05-16 DIAGNOSIS — F314 Bipolar disorder, current episode depressed, severe, without psychotic features: Secondary | ICD-10-CM

## 2012-05-16 NOTE — Progress Notes (Signed)
Time: 09:00 - 10:30  Participation Level:   [x] Active     Behavioral Response:    [x] Appropriate     [] Resistant        [] Drowsy                                                                                             Type of Therapy: Process Group Summary of Progress: Patient states she feels "more awake" today. She states she is struggling with staying alert during the day and feels this is a medication issue. She only talks when called upon to share but when asked how she is doing she expresses agitation as a primary emotion. She describes disappointed towards "parenting her husband" due to his disability and how it limits his abilities to do certain things and how this puts added pressure on her. She struggles to identity positive things in her life and speaks using a tone of cynicism. She was encouraged to share without being called upon to practice having her needs met and expressing feelings on her own as her next goal during treatment.     Time: 10:45-12:00         Participation Level:   [x] Active     Behavioral Response:    [x] Appropriate                                                                                                        Type of Therapy: Psycho-education:  Summary of Progress: Patient participated in a goodbye ceremony and practiced the skill of having healthy closure.

## 2012-05-17 ENCOUNTER — Other Ambulatory Visit (HOSPITAL_COMMUNITY): Payer: BC Managed Care – PPO | Admitting: Psychiatry

## 2012-05-17 DIAGNOSIS — F429 Obsessive-compulsive disorder, unspecified: Secondary | ICD-10-CM

## 2012-05-17 DIAGNOSIS — F314 Bipolar disorder, current episode depressed, severe, without psychotic features: Secondary | ICD-10-CM

## 2012-05-17 NOTE — Progress Notes (Signed)
    Daily Group Progress Note  Program: IOP  Group Time: 9:00-10:30 am   Participation Level: Minimal  Behavioral Response: Appropriate  Type of Therapy:  Process Group  Summary of Progress: Today is patients final day in the group. She initially entered the group upon recommendation from her psychiatrist, Dr. Evelene Croon for the treatment of depression symptoms with crying spells that were impacting her ability to function affectively at work. Patient does not appear to have made much progress with her depression symptoms since starting and states the reasons is because "I slept most days during the group instead of participating". Patient states "I am still waiting for the perfect cocktail of pills to fix me". Members challenged her today on not taking personal responsibility for her own wellness and patient responded by saying "I'm Borderline". She said therapy has never been very helpful to her and talked about how her current individual therapist is working harder at making her well than patient is. Patient said her current commitment to change to work on her depression is a 5/10. She places a lot of emphasis on the fact that she is "borderline" and that is a barrier to her getting better. A recommendation for individual therapy follow up would be working on teaching DBT skills and on assessing  Motivation for change. Patient left group today with the awareness that her lack of motivation to change is her current barrier to making improvement with her depression symptoms.     Group Time: 10:30 am - 12:00 pm   Participation Level:  Minimal  Behavioral Response: Resistant  Type of Therapy: Psycho-education Group  Summary of Progress: Patient was introduced to a stress management technique (Biofeedback) and was educated on the science behind the technique as a tool to manage anxiety symptoms.   Carman Ching, LCSW

## 2012-05-17 NOTE — Patient Instructions (Signed)
Patient completed MH-IOP today.  Will follow up with Hurley Cisco, LCSW on 05-20-12 @ 12 noon and Dr. Evelene Croon on 05-23-12 @ 8:15 a.m..  Encouraged support groups.

## 2012-05-17 NOTE — Progress Notes (Signed)
Patient ID: Suzanne Wilcox, female   DOB: 03-Jul-1975, 36 y.o.   MRN: [id number] D:  This 36 y.o. Caucasian female was referred by Dr. Evelene Croon for Depression and Bipolar Disorder. Pt reports no A/V hallucinations or SI/HI at this time. Pt has history of alcohol abuse but nothing recent. Pt reports increased depression since March 2013. Triggers/Stressors: 1) Cousin committed suicide in March 2013. Still grieving that loss. Pt was unable to attend the funeral services for him. [name] also committed suicide in October 2012. 2) Family stress: Partner is physically and mentally disabled and unable to do many things for himself or others. Pt has to assist him. Rest of family lives in [place]. 3) Financial: Partner unable to work and pt unable to function at work due to depression. 4) Grief: Has post-partum depression and a lot of grief and guilt from things she has done in her life.  Reports being anxious at times, but states that her crying spells have decreased, no energy, no concentration, and suicidal ideations.  Pt states she is able to contract for safety.  Denies A/V hallucinations or HI.   According to pt, group didn't help at all.  When asked why did she continue attending, pt stated that she thought she would give it a try since her insurance company was paying.  A:  D/C pt.  F/U with Hurley Cisco, LCSW on 05-20-12 and Dr. Evelene Croon on 05-23-12.  R:  Pt receptive.

## 2012-05-17 NOTE — Progress Notes (Signed)
  [facility] Intensive Outpatient Program Discharge Summary  SAYGE EARGLE [id number]  Discharge Note  Patient:  Suzanne Wilcox is an 36 y.o., female DOB:  Aug 08, 1975  Date of Admission:  05-04-12  Date of Discharge: 05/17/12  Reason for Admission:  depression  Hospital Course: Patient was admitted to IOP, her multitude was discontinued and her Lamictal was divided into 100 mg twice a day. She was continued on her Xanax 1 mg 4 times a day. She stated that she was not being helped by the Wellbutrin 150 and so this was discontinued. She was started on Remeron 7.5 mg at bedtime and was continued on the Seroquel SR 150 mg at bedtime. Patient struggled with her depression initially felt overly sedated but with a decrease in the Remeron to 7.5 mg from 15 mg she did better. Patient appeared to have a seasonal aspect of compliment and it was discussed that she get sad light. Patient felt the groups were not helpful in addressing her depression. Patient had no suicidal or homicidal ideation and was not psychotic. It was also recommended that she discuss the possibility of Tegretol trials with Dr. Evelene Croon.  Mental Status at Discharge: Alert, oriented x3, affect is constricted mood is dysphoric and anxious, speech is normal. No suicidal or homicidal ideation. No hallucinations or delusions. Recent and remote memory was good, judgment and insight was good, concentration and recall were fair.  Lab Results: No results found for this or any previous visit (from the past 48 hour(s)).  Current outpatient prescriptions:ALPRAZolam (XANAX) 1 MG tablet, Take 1 mg by mouth 4 (four) times daily., Disp: , Rfl: ;  buPROPion (WELLBUTRIN XL) 150 MG 24 hr tablet, Take 150 mg by mouth daily., Disp: , Rfl: ;  lamoTRIgine (LAMICTAL) 200 MG tablet, Take 200 mg by mouth at bedtime., Disp: , Rfl: ;  mirtazapine (REMERON) 15 MG tablet, Take 1 tablet (15 mg total) by mouth at bedtime., Disp: 30 tablet,  Rfl: 0 R. phentermine 37.5 MG capsule, Take 37.5 mg by mouth every morning., Disp: , Rfl: ;  QUEtiapine (SEROQUEL) 50 MG tablet, Take 50 mg by mouth at bedtime. Pt is currently titrating up to anticipated dosage of 450 mg QHS., Disp: , Rfl:   Axis Diagnosis:   Axis I: Anxiety Disorder NOS and Bipolar, Depressed Axis II: Deferred Axis III:  Past Medical History  Diagnosis Date  . Obesity 04/29/2012  . Bipolar disorder   . Headache   . Anxiety   . Depression    Axis IV: economic problems, occupational problems, problems related to social environment and problems with primary support group Axis V: 61-70 mild symptoms   Level of Care:  OP  Discharge destination:  Home  Is patient on multiple antipsychotic therapies at discharge:  No    Has Patient had three or more failed trials of antipsychotic monotherapy by history:  No  Patient phone:  [phone] (home)  Patient address:   [address] [place] [address],   Follow-up recommendations:  Activity as tolerated.      Diet regular.           Followup for medications with Dr. Evelene Croon, and Sa[facility] Revel for therapy  Comments:    The patient received suicide prevention pamphlet:  Yes Belongings returned:    Margit Banda 05/17/2012, 11:34 AM  [facility] 05/17/2012

## 2012-05-18 ENCOUNTER — Other Ambulatory Visit (HOSPITAL_COMMUNITY): Payer: BC Managed Care – PPO

## 2012-05-19 ENCOUNTER — Other Ambulatory Visit (HOSPITAL_COMMUNITY): Payer: BC Managed Care – PPO

## 2012-05-20 ENCOUNTER — Other Ambulatory Visit (HOSPITAL_COMMUNITY): Payer: BC Managed Care – PPO

## 2012-05-23 ENCOUNTER — Other Ambulatory Visit (HOSPITAL_COMMUNITY): Payer: BC Managed Care – PPO

## 2012-05-24 ENCOUNTER — Other Ambulatory Visit (HOSPITAL_COMMUNITY): Payer: BC Managed Care – PPO

## 2012-05-25 ENCOUNTER — Other Ambulatory Visit (HOSPITAL_COMMUNITY): Payer: BC Managed Care – PPO

## 2013-02-15 ENCOUNTER — Emergency Department (HOSPITAL_COMMUNITY)
Admission: EM | Admit: 2013-02-15 | Discharge: 2013-02-15 | Disposition: A | Discharge status: Home / Self Care | Payer: BC Managed Care – PPO | Attending: Emergency Medicine | Admitting: Emergency Medicine

## 2013-02-15 ENCOUNTER — Emergency Department (HOSPITAL_COMMUNITY): Payer: BC Managed Care – PPO

## 2013-02-15 ENCOUNTER — Encounter (HOSPITAL_COMMUNITY): Payer: Self-pay | Admitting: *Deleted

## 2013-02-15 DIAGNOSIS — R61 Generalized hyperhidrosis: Secondary | ICD-10-CM | POA: Insufficient documentation

## 2013-02-15 DIAGNOSIS — Z3202 Encounter for pregnancy test, result negative: Secondary | ICD-10-CM | POA: Insufficient documentation

## 2013-02-15 DIAGNOSIS — F411 Generalized anxiety disorder: Secondary | ICD-10-CM | POA: Insufficient documentation

## 2013-02-15 DIAGNOSIS — Z87891 Personal history of nicotine dependence: Secondary | ICD-10-CM | POA: Insufficient documentation

## 2013-02-15 DIAGNOSIS — R4701 Aphasia: Secondary | ICD-10-CM | POA: Insufficient documentation

## 2013-02-15 DIAGNOSIS — Z79899 Other long term (current) drug therapy: Secondary | ICD-10-CM | POA: Insufficient documentation

## 2013-02-15 DIAGNOSIS — F319 Bipolar disorder, unspecified: Secondary | ICD-10-CM | POA: Insufficient documentation

## 2013-02-15 DIAGNOSIS — E669 Obesity, unspecified: Secondary | ICD-10-CM | POA: Insufficient documentation

## 2013-02-15 DIAGNOSIS — H532 Diplopia: Secondary | ICD-10-CM | POA: Insufficient documentation

## 2013-02-15 DIAGNOSIS — Z8669 Personal history of other diseases of the nervous system and sense organs: Secondary | ICD-10-CM | POA: Insufficient documentation

## 2013-02-15 DIAGNOSIS — R42 Dizziness and giddiness: Secondary | ICD-10-CM

## 2013-02-15 DIAGNOSIS — R4789 Other speech disturbances: Secondary | ICD-10-CM | POA: Insufficient documentation

## 2013-02-15 LAB — URINALYSIS, ROUTINE W REFLEX MICROSCOPIC
Glucose, UA: NEGATIVE mg/dL
Hgb urine dipstick: NEGATIVE
Specific Gravity, Urine: 1.034 — ABNORMAL HIGH (ref 1.005–1.030)
pH: 5.5 (ref 5.0–8.0)

## 2013-02-15 LAB — CBC WITH DIFFERENTIAL/PLATELET
Hemoglobin: 13 g/dL (ref 12.0–15.0)
Lymphocytes Relative: 42 % (ref 12–46)
Lymphs Abs: 2 10*3/uL (ref 0.7–4.0)
MCV: 88.5 fL (ref 78.0–100.0)
Monocytes Relative: 8 % (ref 3–12)
Neutrophils Relative %: 49 % (ref 43–77)
Platelets: 276 10*3/uL (ref 150–400)
RBC: 4.33 MIL/uL (ref 3.87–5.11)
WBC: 4.9 10*3/uL (ref 4.0–10.5)

## 2013-02-15 LAB — URINE MICROSCOPIC-ADD ON

## 2013-02-15 LAB — BASIC METABOLIC PANEL
CO2: 26 mEq/L (ref 19–32)
Glucose, Bld: 76 mg/dL (ref 70–99)
Potassium: 3.7 mEq/L (ref 3.5–5.1)
Sodium: 139 mEq/L (ref 135–145)

## 2013-02-15 NOTE — ED Provider Notes (Signed)
CSN: [id number]     Arrival date & time 02/15/13  4098 History     First MD Initiated Contact with Patient 02/15/13 1021     Chief Complaint  Patient presents with  . Aphasia  . Dizziness   (Consider location/radiation/quality/duration/timing/severity/associated sxs/prior Treatment) HPI Comments: Patient is a 37 year old female with a past medical history of bipolar 1 disorder and OCD who presents with a 3 day history of "feeling drunk." Patient reports sudden onset of dizziness, lightheadedness, double vision, diaphoresis, slurred speech and difficulty ambulating. Patient reports the symptoms started suddenly when she was shopping at [facility]. Patient reports having trouble driving after that. She attributed the symptoms to missing her Seroquel dose that morning but became concerned when the symptoms became intermittent over the past few days. Patient denies any known trigger. No aggravating/alleviating factors. Patient denies chest pain, SOB, abdominal pain, NVD. No other associated symptoms.    Past Medical History  Diagnosis Date  . Obesity 04/29/2012  . Bipolar disorder   . Headache(784.0)   . Anxiety   . Depression    Past Surgical History  Procedure Laterality Date  . No past surgeries    . Cesarean section     Family History  Problem Relation Age of Onset  . Depression Mother   . Depression Paternal Grandmother   . Depression Cousin    History  Substance Use Topics  . Smoking status: Former Smoker    Quit date: 04/29/2009  . Smokeless tobacco: Never Used  . Alcohol Use: No   OB History   Grav Para Term Preterm Abortions TAB SAB Ect Mult Living                 Review of Systems  Eyes: Positive for visual disturbance.  Neurological: Positive for dizziness, speech difficulty and light-headedness.  All other systems reviewed and are negative.    Allergies  Review of patient's allergies indicates no known allergies.  Home Medications   Current Outpatient Rx   Name  Route  Sig  Dispense  Refill  . ALPRAZolam (XANAX) 1 MG tablet   Oral   Take 1 mg by mouth 4 (four) times daily.         [name] buPROPion (WELLBUTRIN XL) 150 MG 24 hr tablet   Oral   Take 150 mg by mouth daily.         [name] lamoTRIgine (LAMICTAL) 200 MG tablet   Oral   Take 200 mg by mouth at bedtime.         . mirtazapine (REMERON) 15 MG tablet   Oral   Take 1 tablet (15 mg total) by mouth at bedtime.   30 tablet   0 R.   . phentermine 37.5 MG capsule   Oral   Take 37.5 mg by mouth every morning.         [name] QUEtiapine (SEROQUEL) 50 MG tablet   Oral   Take 50 mg by mouth at bedtime. Pt is currently titrating up to anticipated dosage of 450 mg QHS.          BP 118/79  Pulse 103  Temp(Src) 97.7 F (36.5 C) (Oral)  Resp 18  SpO2 96%  LMP 02/02/2013 Physical Exam  Nursing note and vitals reviewed. Constitutional: She is oriented to person, place, and time. She appears well-developed and well-nourished. No distress.  HENT:  Head: Normocephalic and atraumatic.  Mouth/Throat: Oropharynx is clear and moist. No oropharyngeal exudate.  Mild left side facial weakness  that patient attributes to history of Bell's palsy. Mild strabismus of left eye.   Eyes: Conjunctivae and EOM are normal. Pupils are equal, round, and reactive to light. No scleral icterus.  Neck: Normal range of motion.  Cardiovascular: Normal rate and regular rhythm.  Exam reveals no gallop and no friction rub.   No murmur heard. Pulmonary/Chest: Effort normal and breath sounds normal. She has no wheezes. She has no rales. She exhibits no tenderness.  Abdominal: Soft. She exhibits no distension. There is no tenderness. There is no rebound and no guarding.  Musculoskeletal: Normal range of motion.  Neurological: She is alert and oriented to person, place, and time. No cranial nerve deficit. Coordination normal.  Extremity strength and sensation equal and intact bilaterally. Speech is slurred. Moves limbs  without ataxia.   Skin: Skin is warm and dry.  Psychiatric: She has a normal mood and affect. Her behavior is normal.    ED Course   Procedures (including critical care time)  Labs Reviewed  BASIC METABOLIC PANEL - Abnormal; Notable for the following:    GFR calc non Af Amer 70 (*)    GFR calc Af Amer 81 (*)    All other components within normal limits  URINALYSIS, ROUTINE W REFLEX MICROSCOPIC - Abnormal; Notable for the following:    Color, Urine AMBER (*)    APPearance CLOUDY (*)    Specific Gravity, Urine 1.034 (*)    Bilirubin Urine SMALL (*)    Ketones, ur 15 (*)    Leukocytes, UA TRACE (*)    All other components within normal limits  URINE MICROSCOPIC-ADD ON - Abnormal; Notable for the following:    Squamous Epithelial / LPF MANY (*)    Crystals CA OXALATE CRYSTALS (*)    All other components within normal limits  URINE CULTURE  CBC WITH DIFFERENTIAL  PREGNANCY, URINE   Ct Head Wo Contrast  02/15/2013   *RADIOLOGY REPORT*  Clinical Data: Slurred speech and dizziness  CT HEAD WITHOUT CONTRAST  Technique:  Contiguous axial images were obtained from the base of the skull through the vertex without contrast. Study was obtained within 24 hours of patient arrival at the emergency department.  Comparison:  Brain MRI April 14, 2005  Findings: Ventricles and sulci are borderline prominent for age. There is no mass, hemorrhage, extra-axial fluid collection, or midline shift.  No focal gray-white compartment lesions are identified.  No acute infarct is demonstrated on this study.  Bony calvarium appears intact.  The mastoid air cells are clear.  IMPRESSION: Borderline atrophy for age.  No mass, hemorrhage, or acute appearing infarct demonstrated.   Original Report Authenticated By: Bretta Bang, M.D.   Mr Brain Wo Contrast  02/15/2013   *RADIOLOGY REPORT*  Clinical Data: Dizziness.  Aphasia.  Slurred speech.  MRI HEAD WITHOUT CONTRAST  Technique:  Multiplanar, multiecho pulse  sequences of the brain and surrounding structures were obtained according to standard protocol without intravenous contrast.  Comparison: Head CT same day.  MRI 04/14/2005.  Findings: The brain has a normal appearance without evidence of malformation, old or acute infarction, mass lesion, hemorrhage, hydrocephalus or extra-axial collection.  No pituitary mass.  No fluid in the sinuses, middle ears or mastoids.  No skull or skull base lesion.  Major vessels are patent.  IMPRESSION: Normal MRI of the brain.   Original Report Authenticated By: Paulina Fusi, M.D.   1. Dizziness     MDM  10:30 AM Labs, urinalysis and CT head pending. Vitals  stable and patient afebrile. Patient has some speech difficulty and residual mild facial droop from previous Bell's palsy.   2:51 PM Labs unremarkable for acute changes. CT head shows no acute changes. MRI done to rule out acute infarct which is negative. Vitals stable and patient afebrile. Patient will be discharged with instructions to follow up with her PCP. Patient instructed to return with worsening or concerning symptoms.   Emilia Beck, PA-C 02/15/13 1459

## 2013-02-15 NOTE — ED Notes (Signed)
Pt reports onset Monday of "feel drunk" having double vision, dizziness, slurred speech and unsteady gait.

## 2013-02-16 LAB — URINE CULTURE: Colony Count: >=100000

## 2013-02-16 NOTE — ED Provider Notes (Signed)
Medical screening examination/treatment/procedure(s) were conducted as a shared visit with non-physician practitioner(s) and myself.  I personally evaluated the patient during the encounter  3 day history of dizziness, slurred speech and difficulty walking. Patient though related to seroquel dose.  Slurred speech on exam, L facial droop (previous bell's palsy), L eye strabismus (chronic).  5/5 strength in upper and lower extremities.  Check MRI  Glynn Octave, MD 02/16/13 [phone]

## 2013-02-17 NOTE — ED Notes (Signed)
No treatment plan  Per [employer]

## 2013-06-17 ENCOUNTER — Emergency Department (INDEPENDENT_AMBULATORY_CARE_PROVIDER_SITE_OTHER)
Admission: EM | Admit: 2013-06-17 | Discharge: 2013-06-17 | Disposition: A | Discharge status: Home / Self Care | Payer: BC Managed Care – PPO | Source: Home / Self Care | Attending: Emergency Medicine | Admitting: Emergency Medicine

## 2013-06-17 ENCOUNTER — Emergency Department (HOSPITAL_COMMUNITY)
Admission: EM | Admit: 2013-06-17 | Discharge: 2013-06-17 | Disposition: A | Discharge status: Home / Self Care | Payer: BC Managed Care – PPO | Attending: Emergency Medicine | Admitting: Emergency Medicine

## 2013-06-17 ENCOUNTER — Encounter (HOSPITAL_COMMUNITY): Payer: Self-pay | Admitting: Emergency Medicine

## 2013-06-17 DIAGNOSIS — Z87891 Personal history of nicotine dependence: Secondary | ICD-10-CM | POA: Insufficient documentation

## 2013-06-17 DIAGNOSIS — Z79899 Other long term (current) drug therapy: Secondary | ICD-10-CM | POA: Insufficient documentation

## 2013-06-17 DIAGNOSIS — K047 Periapical abscess without sinus: Secondary | ICD-10-CM | POA: Insufficient documentation

## 2013-06-17 DIAGNOSIS — R220 Localized swelling, mass and lump, head: Secondary | ICD-10-CM

## 2013-06-17 DIAGNOSIS — R609 Edema, unspecified: Secondary | ICD-10-CM

## 2013-06-17 DIAGNOSIS — Z8669 Personal history of other diseases of the nervous system and sense organs: Secondary | ICD-10-CM | POA: Insufficient documentation

## 2013-06-17 DIAGNOSIS — F411 Generalized anxiety disorder: Secondary | ICD-10-CM | POA: Insufficient documentation

## 2013-06-17 DIAGNOSIS — F319 Bipolar disorder, unspecified: Secondary | ICD-10-CM | POA: Insufficient documentation

## 2013-06-17 DIAGNOSIS — E669 Obesity, unspecified: Secondary | ICD-10-CM | POA: Insufficient documentation

## 2013-06-17 MED ORDER — CLINDAMYCIN HCL 150 MG PO CAPS: 450.0000 mg | ORAL_CAPSULE | Freq: Three times a day (TID) | ORAL | Status: AC

## 2013-06-17 MED ORDER — OXYCODONE HCL 5 MG PO TABS: 5.0000 mg | ORAL_TABLET | ORAL | Status: DC | PRN

## 2013-06-17 NOTE — ED Notes (Signed)
Pt c/o right side facial swelling. Has hx of TMJ went to doctor on Wednesday, with no relief from medication. Facial swelling started today. Denies difficulty breathing

## 2013-06-17 NOTE — ED Notes (Signed)
Pt c/o TMJ on right side onset 1 week... Reports she was seen at another Urgent Care and was Rx Prednisone, Methocarbamol and Vicodin Pain has been getting worse... Dentist referred him to an orthodontist???  She is alert w/no signs of acute distress.

## 2013-06-17 NOTE — ED Provider Notes (Signed)
CSN: [id number]     Arrival date & time 06/17/13  2026 History   First MD Initiated Contact with Patient 06/17/13 2215     Chief Complaint  Patient presents with  . Facial Swelling   HPI  37 y/o female who presents with cc of right sided facial swelling. Symptoms started as jaw pain. Was seen at urgent care and treated for TMJ with antiinflammatories and steroids. Has had no improvement in pain and has now had a 1 day history of right jaw swelling and pain. No fevers or systemic symptoms. Able to eat and drink. No trouble swallowing or voice changes.   Past Medical History  Diagnosis Date  . Obesity 04/29/2012  . Bipolar disorder   . Headache(784.0)   . Anxiety   . Depression    Past Surgical History  Procedure Laterality Date  . No past surgeries    . Cesarean section     Family History  Problem Relation Age of Onset  . Depression Mother   . Depression Paternal Grandmother   . Depression Cousin    History  Substance Use Topics  . Smoking status: Former Smoker    Quit date: 04/29/2009  . Smokeless tobacco: Never Used  . Alcohol Use: No   OB History   Grav Para Term Preterm Abortions TAB SAB Ect Mult Living                 Review of Systems  Constitutional: Negative for fever and chills.  HENT: Positive for dental problem and facial swelling.   Gastrointestinal: Negative for nausea and vomiting.  All other systems reviewed and are negative.    Allergies  Review of patient's allergies indicates no known allergies.  Home Medications   Current Outpatient Rx  Name  Route  Sig  Dispense  Refill  . amphetamine-dextroamphetamine (ADDERALL) 30 MG tablet   Oral   Take 30 mg by mouth 2 (two) times daily.         [name] aspirin-acetaminophen-caffeine (EXCEDRIN MIGRAINE) 250-250-65 MG per tablet   Oral   Take 1 tablet by mouth every 6 (six) hours as needed for pain (headache).         [name] buPROPion (WELLBUTRIN XL) 150 MG 24 hr tablet   Oral   Take 450 mg by mouth  daily.          . calcium carbonate (TUMS - DOSED IN MG ELEMENTAL CALCIUM) 500 MG chewable tablet   Oral   Chew 2 tablets by mouth daily as needed for heartburn.         . clonazePAM (KLONOPIN) 2 MG tablet   Oral   Take 2 mg by mouth 2 (two) times daily as needed for anxiety.         . lamoTRIgine (LAMICTAL) 200 MG tablet   Oral   Take 200 mg by mouth at bedtime.         . Multiple Vitamin (MULTI-VITAMIN DAILY PO)   Oral   Take 1 tablet by mouth daily.         . Nutritional Supplements (VITAMIN D MAINTENANCE PO)   Oral   Take 1 tablet by mouth 2 (two) times a week.         . phentermine 37.5 MG capsule   Oral   Take 37.5 mg by mouth every morning.         [name] QUEtiapine (SEROQUEL) 300 MG tablet   Oral   Take 600 mg by mouth 2 (two)  times daily.         . clindamycin (CLEOCIN) 150 MG capsule   Oral   Take 3 capsules (450 mg total) by mouth 3 (three) times daily.   90 capsule   0   . oxyCODONE (ROXICODONE) 5 MG immediate release tablet   Oral   Take 1 tablet (5 mg total) by mouth every 4 (four) hours as needed for severe pain.   16 tablet   0    BP 146/59  Pulse 109  Temp(Src) 98.2 F (36.8 C) (Oral)  Resp 20  Ht 5\' 8"  (1.727 m)  Wt 343 lb 14.4 oz (155.992 kg)  BMI 52.30 kg/m2  SpO2 100%  LMP 06/07/2013 Physical Exam  Nursing note and vitals reviewed. Constitutional: She is oriented to person, place, and time. She appears well-developed and well-nourished. No distress.  HENT:  Head: Normocephalic and atraumatic.  Mouth/Throat: No trismus in the jaw. No dental caries.  Floor of mouth is soft and non-tender. No significant trismus. TTP along right lower teeth. Adjacent gumline is swollen without palpable fluid collection. Associated soft tissue edema of right lower jaw.   Eyes: Conjunctivae are normal. Pupils are equal, round, and reactive to light.  Neck: Normal range of motion. Neck supple.  Cardiovascular: Normal rate and regular rhythm.   Exam reveals no gallop and no friction rub.   No murmur heard. Pulmonary/Chest: Effort normal and breath sounds normal.  Abdominal: Soft. She exhibits no distension. There is no tenderness.  Musculoskeletal: Normal range of motion. She exhibits no edema and no tenderness.  Neurological: She is alert and oriented to person, place, and time. She has normal strength and normal reflexes. No cranial nerve deficit or sensory deficit.  Skin: Skin is warm and dry.  Psychiatric: She has a normal mood and affect.    ED Course  Procedures (including critical care time) Labs Review Labs Reviewed - No data to display Imaging Review No results found.  EKG Interpretation   None       MDM   Dental infection. Soft tissue swelling but no palpable area of fluctuance amendable to drainage at this time. No systemic symptoms. No evidence of PTA, RPA or ludwig's. HTN on arrival but this improved at time of discharge but still mildly elevated. Likely secondary to pain but instructed the patient to follow up with her pcp in the next few weeks for a recheck. The patient has established care with a dentist in town. Instructed her to follow up as soon as possible. In the meantime will treat with clindamycin and oxycodone for pain. Return precautions given and discussed with the patient who was in agreement with the plan.   1. Dental infection      Shanon Ace, MD 06/18/13 [phone]

## 2013-06-17 NOTE — ED Provider Notes (Signed)
CSN: [id number]     Arrival date & time 06/17/13  1831 History   First MD Initiated Contact with Patient 06/17/13 1945     Chief Complaint  Patient presents with  . Temporomandibular Joint Pain   (Consider location/radiation/quality/duration/timing/severity/associated sxs/prior Treatment) HPI Comments: Patient presents with a one week history of right sided jaw pain that she suspected was an acute exacerbation of TMJ syndrome. States she was seen for same at a local urgent care and prescribed oral prednisone, muscle relaxer and vicodin. States she began taking these medications and was not having improvement. She then called her dentist who offered to call her in additional pain medication an oral antibiotics. She did not feel that these medications would help, so she did not fill them. Waited a few more day to see if symptoms would improve and when they worsened, she decided to seek re-evaluation at our facility. Denies fever or injury. Denies dental pain, sore throat or difficulty speaking or swallowing.   The history is provided by the patient.    Past Medical History  Diagnosis Date  . Obesity 04/29/2012  . Bipolar disorder   . Headache(784.0)   . Anxiety   . Depression    Past Surgical History  Procedure Laterality Date  . No past surgeries    . Cesarean section     Family History  Problem Relation Age of Onset  . Depression Mother   . Depression Paternal Grandmother   . Depression Cousin    History  Substance Use Topics  . Smoking status: Former Smoker    Quit date: 04/29/2009  . Smokeless tobacco: Never Used  . Alcohol Use: No   OB History   Grav Para Term Preterm Abortions TAB SAB Ect Mult Living                 Review of Systems  All other systems reviewed and are negative.    Allergies  Review of patient's allergies indicates no known allergies.  Home Medications   Current Outpatient Rx  Name  Route  Sig  Dispense  Refill  .  amphetamine-dextroamphetamine (ADDERALL) 30 MG tablet   Oral   Take 30 mg by mouth 2 (two) times daily.         [name] aspirin-acetaminophen-caffeine (EXCEDRIN MIGRAINE) 250-250-65 MG per tablet   Oral   Take 1 tablet by mouth every 6 (six) hours as needed for pain (headache).         [name] buPROPion (WELLBUTRIN XL) 150 MG 24 hr tablet   Oral   Take 450 mg by mouth daily.          . calcium carbonate (TUMS - DOSED IN MG ELEMENTAL CALCIUM) 500 MG chewable tablet   Oral   Chew 2 tablets by mouth daily as needed for heartburn.         . clindamycin (CLEOCIN) 150 MG capsule   Oral   Take 3 capsules (450 mg total) by mouth 3 (three) times daily.   90 capsule   0   . clonazePAM (KLONOPIN) 2 MG tablet   Oral   Take 2 mg by mouth 2 (two) times daily as needed for anxiety.         . lamoTRIgine (LAMICTAL) 200 MG tablet   Oral   Take 200 mg by mouth at bedtime.         . Multiple Vitamin (MULTI-VITAMIN DAILY PO)   Oral   Take 1 tablet by mouth daily.         [name]  Nutritional Supplements (VITAMIN D MAINTENANCE PO)   Oral   Take 1 tablet by mouth 2 (two) times a week.         [name] oxyCODONE (ROXICODONE) 5 MG immediate release tablet   Oral   Take 1 tablet (5 mg total) by mouth every 4 (four) hours as needed for severe pain.   16 tablet   0   . phentermine 37.5 MG capsule   Oral   Take 37.5 mg by mouth every morning.         [name] QUEtiapine (SEROQUEL) 300 MG tablet   Oral   Take 600 mg by mouth 2 (two) times daily.          BP 156/113  Pulse 106  Temp(Src) 97.4 F (36.3 C) (Oral)  Resp 18  SpO2 97%  LMP 06/07/2013 Physical Exam  Nursing note and vitals reviewed. Constitutional: She is oriented to person, place, and time. She appears well-developed and well-nourished. No distress.  HENT:  Head: Atraumatic.    Right Ear: Hearing, tympanic membrane, external ear and ear canal normal.  Left Ear: Hearing, tympanic membrane, external ear and ear canal normal.    Nose: Nose normal.  Mouth/Throat: Uvula is midline, oropharynx is clear and moist and mucous membranes are normal. She does not have dentures. No oral lesions. There is trismus in the jaw. Normal dentition. No dental abscesses, uvula swelling, lacerations or dental caries.  Eyes: Conjunctivae are normal. No scleral icterus.  Neck: Normal range of motion. Neck supple.  Cardiovascular: Normal rate, regular rhythm and normal heart sounds.   Pulmonary/Chest: Effort normal and breath sounds normal.  Abdominal: Soft. There is no tenderness.  Neurological: She is alert and oriented to person, place, and time.  Skin: Skin is warm and dry.  Psychiatric: She has a normal mood and affect. Her behavior is normal.    ED Course  Procedures (including critical care time) Labs Review Labs Reviewed - No data to display Imaging Review No results found.  EKG Interpretation    Date/Time:    Ventricular Rate:    PR Interval:    QRS Duration:   QT Interval:    QTC Calculation:   R Axis:     Text Interpretation:              MDM  Exam does not suggest TMJ syndrome but instead a right parotid mass of unknown origin. DDx includes lithiasis with obstructed parotid gland or parotid abscess/necrosis. She will require CT imaging of neck for diagnostic clarification and will be transferred to [facility] via shuttle for further evaluation.     Jess Barters [place], [place] 06/18/13 1012

## 2013-06-18 NOTE — ED Provider Notes (Signed)
I have personally seen and examined the patient.  I have discussed the plan of care with the resident.  I have reviewed the documentation on PMH/FH/Soc. History.  I have reviewed the documentation of the resident and agree.  Doug Sou, MD 06/18/13 [phone]

## 2013-06-18 NOTE — ED Provider Notes (Signed)
Presents with right-sided jaw swelling and pain for approximately the past 3 days. On exam right jaw is swollen and tender. She is mildly tender over right lower premolar teeth. No fluctuance of the gingiva. No trismus no malocclusion of teeth.  Doug Sou, MD 06/18/13 [phone]

## 2013-06-19 NOTE — ED Provider Notes (Signed)
Medical screening examination/treatment/procedure(s) were performed by non-physician practitioner and as supervising physician I was immediately available for consultation/collaboration.  Lavaun Greenfield, M.D.  Sinclair Arrazola C Clarkson Rosselli, MD 06/19/13 0744 

## 2013-08-11 ENCOUNTER — Ambulatory Visit (INDEPENDENT_AMBULATORY_CARE_PROVIDER_SITE_OTHER): Payer: BC Managed Care – PPO | Admitting: Neurology

## 2013-08-11 ENCOUNTER — Encounter: Payer: Self-pay | Admitting: Neurology

## 2013-08-11 VITALS — BP 148/72 | HR 98 | Ht 68.5 in | Wt 339.0 lb

## 2013-08-11 DIAGNOSIS — R413 Other amnesia: Secondary | ICD-10-CM

## 2013-08-11 LAB — TSH: TSH: 3.8 u[IU]/mL (ref 0.350–4.500)

## 2013-08-11 LAB — VITAMIN B12: Vitamin B-12: 562 pg/mL (ref 211–911)

## 2013-08-11 NOTE — Progress Notes (Addendum)
NEUROLOGY CONSULTATION NOTE  Suzanne Wilcox MRN: [id number] DOB: 09-22-1975  Referring provider: Dr. Evelene CroonKaur Primary care provider: None  Reason for consult:  Cognitive problems and fatigue  HISTORY OF PRESENT ILLNESS: Suzanne Wilcox is a 38 year old right-handed woman with history of bipolar 1 disorder and OCD who presents for forgetfulness and clumsiness.  Records and images were personally reviewed where available.    She has a history of Bipolar disorder since adolescence.  It went into remission until 2011 after giving birth and developing post-partum depression.  She was started on Zoloft, which triggered a manic episode.  Over the past couple of years, she has had some cycling, with two episodes of severe depression that debilitated her for a time.  Zoloft was stopped, and she was started on seroquel and wellbutrin.  Since this time, she has had some problems with cognitive difficulties.  At first, she would forget things she needed to write down.  While driving to work, she would sometimes have sudden feeling of fatigue.  One time, she fell asleep at a business meeting.  Initially she would have slurred speech.  This has since resolved, but sometimes she gets "tongue-tied" and will have word-finding issues.  It has increasingly been more difficult to focus and she gets side-tracked easily.  Because of this, it takes her much longer to complete her work.  She works in [employer] and her job has brought this up to her attention.  She was placed on Adderal, which hasn't really helped.  She says that her father had vascular dementia and that stroke runs in her family.  She had an MRI of the brain without contrast performed on 02/15/13 for dizziness and slurred speech.  This was normal and did not reveal a stroke.  She has a history of recurrent Bell's palsy, occuring on either side.  After her second episode in 2006 or 2007, she underwent MRI of the brain and MRA of the head.  There was  evidence of signal abnormality involving the facial nerve, but not involving the brain.  The MRA revealed possible small cerebral aneurysm, but follow up angiography ruled this out.  She had an LP which was reportedly normal.  PAST MEDICAL HISTORY: Past Medical History  Diagnosis Date  . Obesity 04/29/2012  . Bipolar disorder   . Headache(784.0)   . Anxiety   . Depression     PAST SURGICAL HISTORY: Past Surgical History  Procedure Laterality Date  . No past surgeries    . Cesarean section      MEDICATIONS: Current Outpatient Prescriptions on File Prior to Visit  Medication Sig Dispense Refill  . amphetamine-dextroamphetamine (ADDERALL) 30 MG tablet Take 30 mg by mouth 2 (two) times daily.      [name]. aspirin-acetaminophen-caffeine (EXCEDRIN MIGRAINE) 250-250-65 MG per tablet Take 1 tablet by mouth every 6 (six) hours as needed for pain (headache).      [name]. buPROPion (WELLBUTRIN XL) 150 MG 24 hr tablet Take 450 mg by mouth daily.       . calcium carbonate (TUMS - DOSED IN MG ELEMENTAL CALCIUM) 500 MG chewable tablet Chew 2 tablets by mouth daily as needed for heartburn.      . clonazePAM (KLONOPIN) 2 MG tablet Take 2 mg by mouth 2 (two) times daily as needed for anxiety.      . Multiple Vitamin (MULTI-VITAMIN DAILY PO) Take 1 tablet by mouth daily.      . Nutritional Supplements (VITAMIN D MAINTENANCE PO) Take  1 tablet by mouth 2 (two) times a week.      . phentermine 37.5 MG capsule Take 37.5 mg by mouth every morning.      [name] QUEtiapine (SEROQUEL) 300 MG tablet Take 450 mg by mouth daily.        No current facility-administered medications on file prior to visit.    ALLERGIES: No Known Allergies  FAMILY HISTORY: Family History  Problem Relation Age of Onset  . Depression Mother   . Depression Paternal Grandmother   . Depression Cousin   . Pancreatic cancer Father   . Skin cancer Mother   . Skin cancer Sister     SOCIAL HISTORY: History   Social History  . Marital Status:  Significant Other    Spouse Name: N/A    Number of Children: N/A  . Years of Education: N/A   Occupational History  . Not on file.   Social History Main Topics  . Smoking status: Former Smoker    Quit date: 04/29/2009  . Smokeless tobacco: Never Used  . Alcohol Use: No  . Drug Use: No  . Sexual Activity: Yes   Other Topics Concern  . Not on file   Social History Narrative  . No narrative on file    REVIEW OF SYSTEMS: Constitutional: fatigue Eyes: No visual changes, double vision, eye pain Ear, nose and throat: tinnitus Cardiovascular: No chest pain, palpitations Respiratory:  No shortness of breath at rest or with exertion, wheezes GastrointestinaI: No nausea, vomiting, diarrhea, abdominal pain, fecal incontinence Genitourinary:  No dysuria, urinary retention or frequency Musculoskeletal:  Back pain Integumentary: No rash, pruritus, skin lesions Neurological: as above Psychiatric: depression Endocrine: No palpitations, fatigue, diaphoresis, mood swings, change in appetite, change in weight, increased thirst Hematologic/Lymphatic:  No anemia, purpura, petechiae. Allergic/Immunologic: no itchy/runny eyes, nasal congestion, recent allergic reactions, rashes  PHYSICAL EXAM: Filed Vitals:   08/11/13 0756  BP: 148/72  Pulse: 98   General: No acute distress Head:  Normocephalic/atraumatic Neck: supple, no paraspinal tenderness, full range of motion Back: No paraspinal tenderness Heart: regular rate and rhythm Lungs: Clear to auscultation bilaterally. Vascular: No carotid bruits. Neurological Exam: Mental status: alert and oriented to person, place, and time, speech fluent and not dysarthric, language intact.  Able to perform Trail Making test, copy a cube and draw a clock correctly.  Able to recall 5 of 5 words after 5 minutes.  Attention intact.  Naming fluency intact.  Serial 7 subtraction intact.  Abstraction intact.  MOCA 30/30 Cranial nerves: CN I: not tested CN  II: pupils equal, round and reactive to light, visual fields intact, fundi unremarkable. CN III, IV, VI:  full range of motion, no nystagmus, no ptosis CN V: facial sensation intact CN VII: flattening of right nasolabial fold, trace left facial weakness CN VIII: hearing intact CN IX, X: gag intact, uvula midline CN XI: sternocleidomastoid and trapezius muscles intact CN XII: tongue midline Bulk & Tone: normal, no fasciculations. Motor: 5/5 throughout Sensation: temperature and vibration intact Deep Tendon Reflexes: 2+ throughout, toes down Finger to nose testing: normal Heel to shin: normal Gait: normal stance and stride.  Able to walk in tandem. Romberg negative.  IMPRESSION: Subjective memory and cognitive issues.  Normal neurological and cognitive exam.  Do not suspect anything organic.  Most likely medication-related or secondary to depression.  PLAN: 1.  Will check B12 and TSH 2.  Would like to re-evaluate in 6 months to determine if further testing is warranted.  45  minutes spent with patient, over 50% spent counseling and coordinating care.  Thank you for allowing me to take part in the care of this patient.  Shon Millet, DO  CC:  Milagros Evener, MD (psychiatrist)

## 2013-08-11 NOTE — Patient Instructions (Signed)
Your physical neurological exam and cognitive exam were normal, which is reassuring.  I don't think there is anything pathological going on.  To be sure, we will check vitamin B12 and thyroid levels.  I would like to re-evaluate you in 6 months to see if everything is stable.

## 2013-08-15 LAB — METHYLMALONIC ACID, SERUM: Methylmalonic Acid, Quant: 0.23 umol/L (ref <0.40)

## 2013-08-16 ENCOUNTER — Telehealth: Payer: Self-pay | Admitting: *Deleted

## 2013-08-16 NOTE — Telephone Encounter (Signed)
No answer did not leave voicemail  

## 2013-08-18 ENCOUNTER — Telehealth: Payer: Self-pay | Admitting: *Deleted

## 2013-08-18 NOTE — Telephone Encounter (Signed)
Patient is aware that blood work is normal and advised to make a appt in 6 months

## 2013-10-26 LAB — OB RESULTS CONSOLE ANTIBODY SCREEN: ANTIBODY SCREEN: NEGATIVE

## 2013-10-26 LAB — OB RESULTS CONSOLE RPR: RPR: NONREACTIVE

## 2013-10-26 LAB — OB RESULTS CONSOLE ABO/RH: RH Type: POSITIVE

## 2013-10-26 LAB — OB RESULTS CONSOLE HIV ANTIBODY (ROUTINE TESTING): HIV: NONREACTIVE

## 2013-10-26 LAB — OB RESULTS CONSOLE RUBELLA ANTIBODY, IGM: Rubella: IMMUNE

## 2013-10-26 LAB — OB RESULTS CONSOLE HEPATITIS B SURFACE ANTIGEN: Hepatitis B Surface Ag: NEGATIVE

## 2013-11-23 ENCOUNTER — Other Ambulatory Visit: Payer: Self-pay

## 2014-01-04 ENCOUNTER — Other Ambulatory Visit (HOSPITAL_COMMUNITY): Payer: Self-pay | Admitting: Obstetrics and Gynecology

## 2014-01-04 DIAGNOSIS — Z3689 Encounter for other specified antenatal screening: Secondary | ICD-10-CM

## 2014-01-04 DIAGNOSIS — O288 Other abnormal findings on antenatal screening of mother: Secondary | ICD-10-CM

## 2014-01-10 ENCOUNTER — Ambulatory Visit (HOSPITAL_COMMUNITY)
Admission: RE | Admit: 2014-01-10 | Discharge: 2014-01-10 | Disposition: A | Discharge status: Home / Self Care | Payer: BC Managed Care – PPO | Source: Ambulatory Visit | Attending: Obstetrics and Gynecology | Admitting: Obstetrics and Gynecology

## 2014-01-10 ENCOUNTER — Other Ambulatory Visit (HOSPITAL_COMMUNITY): Payer: Self-pay | Admitting: Obstetrics and Gynecology

## 2014-01-10 ENCOUNTER — Encounter (HOSPITAL_COMMUNITY): Payer: Self-pay

## 2014-01-10 DIAGNOSIS — O288 Other abnormal findings on antenatal screening of mother: Secondary | ICD-10-CM

## 2014-01-10 DIAGNOSIS — O09522 Supervision of elderly multigravida, second trimester: Secondary | ICD-10-CM

## 2014-01-10 DIAGNOSIS — O4190X0 Disorder of amniotic fluid and membranes, unspecified, unspecified trimester, not applicable or unspecified: Secondary | ICD-10-CM | POA: Insufficient documentation

## 2014-01-10 DIAGNOSIS — Z3689 Encounter for other specified antenatal screening: Secondary | ICD-10-CM

## 2014-01-10 DIAGNOSIS — O09529 Supervision of elderly multigravida, unspecified trimester: Secondary | ICD-10-CM | POA: Insufficient documentation

## 2014-04-30 ENCOUNTER — Encounter (HOSPITAL_COMMUNITY): Payer: Self-pay

## 2014-05-14 ENCOUNTER — Other Ambulatory Visit: Payer: Self-pay | Admitting: Obstetrics and Gynecology

## 2014-05-17 NOTE — Patient Instructions (Addendum)
   Your procedure is scheduled on: Tuesday, Nov 24  [facility] at: 8:30 AM Pick up the phone at the desk and dial [phone] and inform us of your arrival.  Please call this number if you have any problems the morning of surgery: [phone]  Remember: Do not eat or drink after midnight: Monday Take these medicines the morning of surgery with a SIP OF WATER: wellbutrin, buspar, labetalol, zantac  Do not wear jewelry, make-up, or FINGER nail polish No metal in your hair or on your body. Do not wear lotions, powders, perfumes.  You may wear deodorant.  Do not bring valuables to the hospital. Contacts, dentures or bridgework may not be worn into surgery.  Leave suitcase in the car. After Surgery it may be brought to your room. For patients being admitted to the hospital, checkout time is 11:00am the day of discharge.  Home with [facility] cell [phone]

## 2014-05-18 ENCOUNTER — Encounter (HOSPITAL_COMMUNITY): Payer: Self-pay

## 2014-05-21 ENCOUNTER — Encounter (HOSPITAL_COMMUNITY): Payer: Self-pay

## 2014-05-21 ENCOUNTER — Encounter (HOSPITAL_COMMUNITY)
Admission: RE | Admit: 2014-05-21 | Discharge: 2014-05-21 | Disposition: A | Discharge status: Home / Self Care | Payer: BC Managed Care – PPO | Source: Ambulatory Visit | Attending: Obstetrics and Gynecology | Admitting: Obstetrics and Gynecology

## 2014-05-21 HISTORY — DX: Encounter for elective termination of pregnancy: Z33.2

## 2014-05-21 HISTORY — DX: Missed abortion: O02.1

## 2014-05-21 HISTORY — DX: Gastro-esophageal reflux disease without esophagitis: K21.9

## 2014-05-21 HISTORY — DX: Essential (primary) hypertension: I10

## 2014-05-21 LAB — COMPREHENSIVE METABOLIC PANEL
ALT: 12 U/L (ref 0–35)
AST: 10 U/L (ref 0–37)
Albumin: 2.6 g/dL — ABNORMAL LOW (ref 3.5–5.2)
Alkaline Phosphatase: 85 U/L (ref 39–117)
Anion gap: 12 (ref 5–15)
BUN: 9 mg/dL (ref 6–23)
CO2: 22 meq/L (ref 19–32)
Calcium: 8.8 mg/dL (ref 8.4–10.5)
Chloride: 102 mEq/L (ref 96–112)
Creatinine, Ser: 0.62 mg/dL (ref 0.50–1.10)
GFR calc Af Amer: >90 mL/min (ref >90)
GFR calc non Af Amer: >90 mL/min (ref >90)
Glucose, Bld: 121 mg/dL — ABNORMAL HIGH (ref 70–99)
POTASSIUM: 3.9 meq/L (ref 3.7–5.3)
SODIUM: 136 meq/L — AB (ref 137–147)
Total Bilirubin: <0.2 mg/dL — ABNORMAL LOW (ref 0.3–1.2)
Total Protein: 6 g/dL (ref 6.0–8.3)

## 2014-05-21 LAB — CBC
HCT: 38 % (ref 36.0–46.0)
Hemoglobin: 12.5 g/dL (ref 12.0–15.0)
MCH: 29.1 pg (ref 26.0–34.0)
MCHC: 32.9 g/dL (ref 30.0–36.0)
MCV: 88.6 fL (ref 78.0–100.0)
PLATELETS: 258 10*3/uL (ref 150–400)
RBC: 4.29 MIL/uL (ref 3.87–5.11)
RDW: 14.5 % (ref 11.5–15.5)
WBC: 9.7 10*3/uL (ref 4.0–10.5)

## 2014-05-21 LAB — ABO/RH: ABO/RH(D): O POS

## 2014-05-21 LAB — RPR

## 2014-05-21 MED ORDER — DEXTROSE 5 % IV SOLN
3.0000 g | INTRAVENOUS | Status: AC
  Administered 2014-05-22: 3 g via INTRAVENOUS
  Filled 2014-05-21: qty 3000

## 2014-05-21 NOTE — H&P (Signed)
NAMMarland Kitchen:  Suzanne Wilcox, Suzanne Wilcox         ACCOUNT NO.:  [id number]  MEDICAL RECORD NO.:  [id number]  LOCATION:  PERIO                         FACILITY:  [facility]  PHYSICIAN:  Lenoard Adenichard J. Lilibeth Opie, M.D.DATE OF BIRTH:  [birth year]  DATE OF ADMISSION:  04/03/2014 DATE OF DISCHARGE:                             HISTORY & PHYSICAL   CHIEF COMPLAINT:  Chronic hypertension with oligohydramnios, breech for repeat C-section.  HISTORY OF PRESENT ILLNESS:  A 38 year old morbidly obese white female G9, P1, who presents for repeat C-section due to aforementioned medications.  MEDICATIONS:  Labetalol, prenatal vitamins, Wellbutrin,  Seroquel.  SOCIAL HISTORY:  She is a nonsmoker, nondrinker.  She denies any domestic or physical violence.  PAST MEDICAL HISTORY:  She has a past medical history of chronic hypertension, on labetalol.  ALLERGIES:  She has no known drug allergies.  FAMILY HISTORY:  Hypertension, pancreatic cancer, skin cancer, dementia, COPD, history of C-section x1, abortion x1, SAB x6.  History of LEEP.  PRENATAL COURSE:  Complicated by oligo; chronic hypertensionon labetalol and breech presentation.  PHYSICAL EXAMINATION:  GENERAL:  Obese, white female, in no acute distress. VITAL SIGNS:  Blood pressure is 140/90 and 138/94. HEENT:  Normal. NECK:  Supple.  Full range of motion. LUNGS:  Clear. HEART:  Regular rate and rhythm. ABDOMEN:  Soft, gravid, nontender.  Cervix closed, 50%, vertex, breech - 2. EXTREMITIES:  There are no cords. NEUROLOGIC:  Nonfocal. SKIN:  Intact.  IMPRESSION: 1. Chronic hypertension , labile on labetalol. 2. Oligohydramnios. 3. Breech. 4. Morbid obesity. 5. Previous cesarean section.  PLAN:  Proceed with low-segment transverse repeat cesarean section. Risks of anesthesia, infection, bleeding, injury to surrounding organs, and possible need for repair was discussed.  Delayed versus immediate complications to include bowel and bladder injury are noted.   The patient acknowledges and wishes to proceed.     Lenoard Adenichard J. Doyle Tegethoff, M.D.     RJT/MEDQ  D:  05/21/2014  T:  05/21/2014  Job:  [id number]

## 2014-05-22 ENCOUNTER — Inpatient Hospital Stay (HOSPITAL_COMMUNITY)
Admission: AD | Admit: 2014-05-22 | Discharge: 2014-05-25 | DRG: 765 | Disposition: A | Discharge status: Home / Self Care | Payer: BC Managed Care – PPO | Source: Ambulatory Visit | Attending: Obstetrics and Gynecology | Admitting: Obstetrics and Gynecology

## 2014-05-22 ENCOUNTER — Encounter (HOSPITAL_COMMUNITY)
Admission: AD | Disposition: A | Discharge status: Home / Self Care | Payer: Self-pay | Source: Ambulatory Visit | Attending: Obstetrics and Gynecology

## 2014-05-22 ENCOUNTER — Inpatient Hospital Stay (HOSPITAL_COMMUNITY): Payer: BC Managed Care – PPO | Admitting: Anesthesiology

## 2014-05-22 ENCOUNTER — Encounter (HOSPITAL_COMMUNITY): Payer: Self-pay | Admitting: *Deleted

## 2014-05-22 DIAGNOSIS — N858 Other specified noninflammatory disorders of uterus: Secondary | ICD-10-CM | POA: Diagnosis present

## 2014-05-22 DIAGNOSIS — K219 Gastro-esophageal reflux disease without esophagitis: Secondary | ICD-10-CM | POA: Diagnosis present

## 2014-05-22 DIAGNOSIS — Z6841 Body Mass Index (BMI) 40.0 and over, adult: Secondary | ICD-10-CM

## 2014-05-22 DIAGNOSIS — O321XX0 Maternal care for breech presentation, not applicable or unspecified: Secondary | ICD-10-CM | POA: Insufficient documentation

## 2014-05-22 DIAGNOSIS — Z8249 Family history of ischemic heart disease and other diseases of the circulatory system: Secondary | ICD-10-CM

## 2014-05-22 DIAGNOSIS — O09523 Supervision of elderly multigravida, third trimester: Secondary | ICD-10-CM

## 2014-05-22 DIAGNOSIS — O1002 Pre-existing essential hypertension complicating childbirth: Secondary | ICD-10-CM | POA: Diagnosis present

## 2014-05-22 DIAGNOSIS — F419 Anxiety disorder, unspecified: Secondary | ICD-10-CM | POA: Diagnosis present

## 2014-05-22 DIAGNOSIS — O99214 Obesity complicating childbirth: Secondary | ICD-10-CM | POA: Diagnosis present

## 2014-05-22 DIAGNOSIS — D62 Acute posthemorrhagic anemia: Secondary | ICD-10-CM | POA: Diagnosis not present

## 2014-05-22 DIAGNOSIS — O9962 Diseases of the digestive system complicating childbirth: Secondary | ICD-10-CM | POA: Diagnosis present

## 2014-05-22 DIAGNOSIS — Z3A38 38 weeks gestation of pregnancy: Secondary | ICD-10-CM | POA: Diagnosis present

## 2014-05-22 DIAGNOSIS — F319 Bipolar disorder, unspecified: Secondary | ICD-10-CM | POA: Diagnosis present

## 2014-05-22 DIAGNOSIS — O4103X0 Oligohydramnios, third trimester, not applicable or unspecified: Secondary | ICD-10-CM | POA: Diagnosis present

## 2014-05-22 DIAGNOSIS — O3421 Maternal care for scar from previous cesarean delivery: Secondary | ICD-10-CM | POA: Diagnosis present

## 2014-05-22 DIAGNOSIS — O9081 Anemia of the puerperium: Secondary | ICD-10-CM | POA: Diagnosis not present

## 2014-05-22 DIAGNOSIS — O99344 Other mental disorders complicating childbirth: Secondary | ICD-10-CM | POA: Diagnosis present

## 2014-05-22 DIAGNOSIS — O328XX0 Maternal care for other malpresentation of fetus, not applicable or unspecified: Secondary | ICD-10-CM | POA: Diagnosis present

## 2014-05-22 LAB — PREPARE RBC (CROSSMATCH): Order Confirmation: POSITIVE

## 2014-05-22 SURGERY — Surgical Case
Anesthesia: Spinal | Site: Abdomen

## 2014-05-22 MED ORDER — MORPHINE SULFATE (PF) 0.5 MG/ML IJ SOLN
INTRAMUSCULAR | Status: DC | PRN
  Administered 2014-05-22: .15 mg via INTRATHECAL

## 2014-05-22 MED ORDER — ONDANSETRON HCL 4 MG/2ML IJ SOLN: 4.0000 mg | Freq: Three times a day (TID) | INTRAMUSCULAR | Status: DC | PRN

## 2014-05-22 MED ORDER — NALOXONE HCL 1 MG/ML IJ SOLN: 1.0000 ug/kg/h | INTRAVENOUS | Status: DC | PRN

## 2014-05-22 MED ORDER — SODIUM CHLORIDE 0.9 % IJ SOLN: 3.0000 mL | INTRAMUSCULAR | Status: DC | PRN

## 2014-05-22 MED ORDER — OXYCODONE-ACETAMINOPHEN 5-325 MG PO TABS
1.0000 | ORAL_TABLET | ORAL | Status: DC | PRN
  Administered 2014-05-23 – 2014-05-25 (×9): 1 via ORAL
  Filled 2014-05-22 (×8): qty 1

## 2014-05-22 MED ORDER — FENTANYL CITRATE 0.05 MG/ML IJ SOLN
INTRAMUSCULAR | Status: DC | PRN
  Administered 2014-05-22: 25 ug via INTRATHECAL

## 2014-05-22 MED ORDER — FENTANYL CITRATE 0.05 MG/ML IJ SOLN
INTRAMUSCULAR | Status: AC
  Filled 2014-05-22: qty 2

## 2014-05-22 MED ORDER — KETOROLAC TROMETHAMINE 30 MG/ML IJ SOLN
30.0000 mg | Freq: Four times a day (QID) | INTRAMUSCULAR | Status: AC | PRN
  Administered 2014-05-22: 30 mg via INTRAMUSCULAR

## 2014-05-22 MED ORDER — MORPHINE SULFATE 0.5 MG/ML IJ SOLN
INTRAMUSCULAR | Status: AC
  Filled 2014-05-22: qty 10

## 2014-05-22 MED ORDER — SIMETHICONE 80 MG PO CHEW: 80.0000 mg | CHEWABLE_TABLET | ORAL | Status: DC | PRN

## 2014-05-22 MED ORDER — DIPHENHYDRAMINE HCL 25 MG PO CAPS: 25.0000 mg | ORAL_CAPSULE | ORAL | Status: DC | PRN

## 2014-05-22 MED ORDER — PHENYLEPHRINE 40 MCG/ML (10ML) SYRINGE FOR IV PUSH (FOR BLOOD PRESSURE SUPPORT)
PREFILLED_SYRINGE | INTRAVENOUS | Status: AC
  Filled 2014-05-22: qty 5

## 2014-05-22 MED ORDER — OXYTOCIN 40 UNITS IN LACTATED RINGERS INFUSION - SIMPLE MED
INTRAVENOUS | Status: DC | PRN
  Administered 2014-05-22: 40 [IU] via INTRAVENOUS

## 2014-05-22 MED ORDER — OXYTOCIN 40 UNITS IN LACTATED RINGERS INFUSION - SIMPLE MED: 62.5000 mL/h | INTRAVENOUS | Status: AC

## 2014-05-22 MED ORDER — BUPIVACAINE HCL (PF) 0.25 % IJ SOLN
INTRAMUSCULAR | Status: DC | PRN
  Administered 2014-05-22: 30 mL

## 2014-05-22 MED ORDER — 0.9 % SODIUM CHLORIDE (POUR BTL) OPTIME
TOPICAL | Status: DC | PRN
  Administered 2014-05-22: 1000 mL

## 2014-05-22 MED ORDER — MENTHOL 3 MG MT LOZG: 1.0000 | LOZENGE | OROMUCOSAL | Status: DC | PRN

## 2014-05-22 MED ORDER — SIMETHICONE 80 MG PO CHEW
80.0000 mg | CHEWABLE_TABLET | Freq: Three times a day (TID) | ORAL | Status: DC
  Administered 2014-05-22 – 2014-05-25 (×8): 80 mg via ORAL
  Filled 2014-05-22 (×8): qty 1

## 2014-05-22 MED ORDER — METHYLERGONOVINE MALEATE 0.2 MG/ML IJ SOLN: 0.2000 mg | INTRAMUSCULAR | Status: DC | PRN

## 2014-05-22 MED ORDER — ONDANSETRON HCL 4 MG/2ML IJ SOLN
INTRAMUSCULAR | Status: AC
  Filled 2014-05-22: qty 2

## 2014-05-22 MED ORDER — PHENYLEPHRINE 8 MG IN D5W 100 ML (0.08MG/ML) PREMIX OPTIME
INJECTION | INTRAVENOUS | Status: AC
  Filled 2014-05-22: qty 100

## 2014-05-22 MED ORDER — ONDANSETRON HCL 4 MG/2ML IJ SOLN
INTRAMUSCULAR | Status: DC | PRN
  Administered 2014-05-22: 4 mg via INTRAVENOUS

## 2014-05-22 MED ORDER — FAMOTIDINE 20 MG PO TABS
20.0000 mg | ORAL_TABLET | Freq: Two times a day (BID) | ORAL | Status: DC
  Administered 2014-05-22 – 2014-05-25 (×6): 20 mg via ORAL
  Filled 2014-05-22 (×6): qty 1

## 2014-05-22 MED ORDER — NALBUPHINE HCL 10 MG/ML IJ SOLN: 5.0000 mg | Freq: Once | INTRAMUSCULAR | Status: AC | PRN

## 2014-05-22 MED ORDER — PRENATAL MULTIVITAMIN CH
1.0000 | ORAL_TABLET | Freq: Every day | ORAL | Status: DC
  Administered 2014-05-22 – 2014-05-24 (×3): 1 via ORAL
  Filled 2014-05-22 (×3): qty 1

## 2014-05-22 MED ORDER — LACTATED RINGERS IV SOLN
Freq: Once | INTRAVENOUS | Status: AC
  Administered 2014-05-22 (×3): via INTRAVENOUS

## 2014-05-22 MED ORDER — TETANUS-DIPHTH-ACELL PERTUSSIS 5-2.5-18.5 LF-MCG/0.5 IM SUSP: 0.5000 mL | Freq: Once | INTRAMUSCULAR | Status: DC

## 2014-05-22 MED ORDER — LABETALOL HCL 100 MG PO TABS
100.0000 mg | ORAL_TABLET | Freq: Three times a day (TID) | ORAL | Status: DC
  Administered 2014-05-22 – 2014-05-25 (×9): 100 mg via ORAL
  Filled 2014-05-22 (×10): qty 1

## 2014-05-22 MED ORDER — WITCH HAZEL-GLYCERIN EX PADS: 1.0000 "application " | MEDICATED_PAD | CUTANEOUS | Status: DC | PRN

## 2014-05-22 MED ORDER — NALBUPHINE HCL 10 MG/ML IJ SOLN: 5.0000 mg | INTRAMUSCULAR | Status: DC | PRN

## 2014-05-22 MED ORDER — ZOLPIDEM TARTRATE 5 MG PO TABS: 5.0000 mg | ORAL_TABLET | Freq: Every evening | ORAL | Status: DC | PRN

## 2014-05-22 MED ORDER — IBUPROFEN 600 MG PO TABS
600.0000 mg | ORAL_TABLET | Freq: Four times a day (QID) | ORAL | Status: DC
  Administered 2014-05-22 – 2014-05-25 (×11): 600 mg via ORAL
  Filled 2014-05-22 (×11): qty 1

## 2014-05-22 MED ORDER — KETOROLAC TROMETHAMINE 30 MG/ML IJ SOLN
INTRAMUSCULAR | Status: AC
  Filled 2014-05-22: qty 1

## 2014-05-22 MED ORDER — DIPHENHYDRAMINE HCL 50 MG/ML IJ SOLN: 12.5000 mg | INTRAMUSCULAR | Status: DC | PRN

## 2014-05-22 MED ORDER — LACTATED RINGERS IV BOLUS (SEPSIS)
500.0000 mL | Freq: Once | INTRAVENOUS | Status: AC
  Administered 2014-05-22: 500 mL via INTRAVENOUS

## 2014-05-22 MED ORDER — BUPROPION HCL ER (XL) 300 MG PO TB24
300.0000 mg | ORAL_TABLET | Freq: Every day | ORAL | Status: DC
  Administered 2014-05-23 – 2014-05-24 (×3): 300 mg via ORAL
  Filled 2014-05-22 (×3): qty 1

## 2014-05-22 MED ORDER — FENTANYL CITRATE 0.05 MG/ML IJ SOLN: 25.0000 ug | INTRAMUSCULAR | Status: DC | PRN

## 2014-05-22 MED ORDER — SIMETHICONE 80 MG PO CHEW
80.0000 mg | CHEWABLE_TABLET | ORAL | Status: DC
  Administered 2014-05-22 – 2014-05-24 (×3): 80 mg via ORAL
  Filled 2014-05-22 (×3): qty 1

## 2014-05-22 MED ORDER — ONDANSETRON HCL 4 MG/2ML IJ SOLN: 4.0000 mg | INTRAMUSCULAR | Status: DC | PRN

## 2014-05-22 MED ORDER — DIPHENHYDRAMINE HCL 25 MG PO CAPS: 25.0000 mg | ORAL_CAPSULE | Freq: Four times a day (QID) | ORAL | Status: DC | PRN

## 2014-05-22 MED ORDER — SODIUM CHLORIDE 0.9 % IJ SOLN
INTRAMUSCULAR | Status: AC
  Filled 2014-05-22: qty 20

## 2014-05-22 MED ORDER — OXYCODONE-ACETAMINOPHEN 5-325 MG PO TABS
2.0000 | ORAL_TABLET | ORAL | Status: DC | PRN
  Administered 2014-05-22 – 2014-05-25 (×4): 2 via ORAL
  Filled 2014-05-22 (×5): qty 2

## 2014-05-22 MED ORDER — KETOROLAC TROMETHAMINE 30 MG/ML IJ SOLN: 30.0000 mg | Freq: Four times a day (QID) | INTRAMUSCULAR | Status: AC | PRN

## 2014-05-22 MED ORDER — LANOLIN HYDROUS EX OINT: 1.0000 "application " | TOPICAL_OINTMENT | CUTANEOUS | Status: DC | PRN

## 2014-05-22 MED ORDER — BUPIVACAINE IN DEXTROSE 0.75-8.25 % IT SOLN
INTRATHECAL | Status: DC | PRN
  Administered 2014-05-22: 1.8 mL via INTRATHECAL

## 2014-05-22 MED ORDER — PHENYLEPHRINE HCL 10 MG/ML IJ SOLN
20.0000 mg | INTRAVENOUS | Status: DC | PRN
  Administered 2014-05-22: 60 ug/min via INTRAVENOUS

## 2014-05-22 MED ORDER — OXYTOCIN 10 UNIT/ML IJ SOLN
INTRAMUSCULAR | Status: AC
  Filled 2014-05-22: qty 4

## 2014-05-22 MED ORDER — NALOXONE HCL 0.4 MG/ML IJ SOLN: 0.4000 mg | INTRAMUSCULAR | Status: DC | PRN

## 2014-05-22 MED ORDER — LACTATED RINGERS IV SOLN: INTRAVENOUS | Status: DC

## 2014-05-22 MED ORDER — DIBUCAINE 1 % RE OINT: 1.0000 "application " | TOPICAL_OINTMENT | RECTAL | Status: DC | PRN

## 2014-05-22 MED ORDER — SCOPOLAMINE 1 MG/3DAYS TD PT72
1.0000 | MEDICATED_PATCH | Freq: Once | TRANSDERMAL | Status: DC
  Administered 2014-05-22: 1.5 mg via TRANSDERMAL

## 2014-05-22 MED ORDER — BUPIVACAINE HCL (PF) 0.25 % IJ SOLN
INTRAMUSCULAR | Status: AC
  Filled 2014-05-22: qty 30

## 2014-05-22 MED ORDER — METHYLERGONOVINE MALEATE 0.2 MG PO TABS: 0.2000 mg | ORAL_TABLET | ORAL | Status: DC | PRN

## 2014-05-22 MED ORDER — SENNOSIDES-DOCUSATE SODIUM 8.6-50 MG PO TABS
2.0000 | ORAL_TABLET | ORAL | Status: DC
  Administered 2014-05-22 – 2014-05-24 (×3): 2 via ORAL
  Filled 2014-05-22 (×3): qty 2

## 2014-05-22 MED ORDER — KETAMINE HCL 10 MG/ML IJ SOLN
INTRAMUSCULAR | Status: AC
  Filled 2014-05-22: qty 1

## 2014-05-22 MED ORDER — LACTATED RINGERS IV SOLN
INTRAVENOUS | Status: DC
  Administered 2014-05-22 – 2014-05-23 (×3): via INTRAVENOUS

## 2014-05-22 MED ORDER — SCOPOLAMINE 1 MG/3DAYS TD PT72
MEDICATED_PATCH | TRANSDERMAL | Status: AC
  Filled 2014-05-22: qty 1

## 2014-05-22 MED ORDER — ONDANSETRON HCL 4 MG PO TABS: 4.0000 mg | ORAL_TABLET | ORAL | Status: DC | PRN

## 2014-05-22 MED ORDER — MEPERIDINE HCL 25 MG/ML IJ SOLN: 6.2500 mg | INTRAMUSCULAR | Status: DC | PRN

## 2014-05-22 MED ORDER — QUETIAPINE FUMARATE 400 MG PO TABS
400.0000 mg | ORAL_TABLET | Freq: Every day | ORAL | Status: DC
  Administered 2014-05-22 – 2014-05-24 (×3): 400 mg via ORAL
  Filled 2014-05-22 (×3): qty 1

## 2014-05-22 MED ORDER — PHENYLEPHRINE HCL 10 MG/ML IJ SOLN
INTRAMUSCULAR | Status: DC | PRN
  Administered 2014-05-22 (×2): 80 ug via INTRAVENOUS

## 2014-05-22 SURGICAL SUPPLY — 37 items
CLAMP CORD UMBIL (MISCELLANEOUS) IMPLANT
CLOTH BEACON ORANGE TIMEOUT ST (SAFETY) ×2 IMPLANT
CONTAINER PREFILL 10% NBF 15ML (MISCELLANEOUS) IMPLANT
DRAPE SHEET LG 3/4 BI-LAMINATE (DRAPES) ×1 IMPLANT
DRSG OPSITE POSTOP 4X10 (GAUZE/BANDAGES/DRESSINGS) ×2 IMPLANT
DURAPREP 26ML APPLICATOR (WOUND CARE) ×3 IMPLANT
ELECT REM PT RETURN 9FT ADLT (ELECTROSURGICAL) ×2
ELECTRODE REM PT RTRN 9FT ADLT (ELECTROSURGICAL) ×1 IMPLANT
EXTRACTOR VACUUM M CUP 4 TUBE (SUCTIONS) IMPLANT
GLOVE BIO SURGEON STRL SZ7.5 (GLOVE) ×2 IMPLANT
GOWN STRL REUS W/TWL LRG LVL3 (GOWN DISPOSABLE) ×4 IMPLANT
KIT ABG SYR 3ML LUER SLIP (SYRINGE) IMPLANT
NDL HYPO 25X1 1.5 SAFETY (NEEDLE) ×1 IMPLANT
NDL HYPO 25X5/8 SAFETYGLIDE (NEEDLE) IMPLANT
NDL SPNL 20GX3.5 QUINCKE YW (NEEDLE) IMPLANT
NEEDLE HYPO 25X1 1.5 SAFETY (NEEDLE) ×2 IMPLANT
NEEDLE HYPO 25X5/8 SAFETYGLIDE (NEEDLE) ×2 IMPLANT
NEEDLE SPNL 20GX3.5 QUINCKE YW (NEEDLE) IMPLANT
NS IRRIG 1000ML POUR BTL (IV SOLUTION) ×2 IMPLANT
PACK C SECTION WH (CUSTOM PROCEDURE TRAY) ×2 IMPLANT
PAD ABD 8X7 1/2 STERILE (GAUZE/BANDAGES/DRESSINGS) ×1 IMPLANT
SPONGE GAUZE 4X4 12PLY STER LF (GAUZE/BANDAGES/DRESSINGS) ×1 IMPLANT
STAPLER VISISTAT 35W (STAPLE) ×1 IMPLANT
SUT MNCRL 0 VIOLET CTX 36 (SUTURE) ×2 IMPLANT
SUT MNCRL AB 3-0 PS2 27 (SUTURE) IMPLANT
SUT MON AB 2-0 CT1 27 (SUTURE) ×2 IMPLANT
SUT MON AB-0 CT1 36 (SUTURE) ×5 IMPLANT
SUT MONOCRYL 0 CTX 36 (SUTURE) ×3
SUT PLAIN 0 NONE (SUTURE) IMPLANT
SUT PLAIN 2 0 (SUTURE)
SUT PLAIN 2 0 XLH (SUTURE) ×1 IMPLANT
SUT PLAIN ABS 2-0 CT1 27XMFL (SUTURE) IMPLANT
SYR 20CC LL (SYRINGE) IMPLANT
SYR CONTROL 10ML LL (SYRINGE) ×2 IMPLANT
TOWEL OR 17X24 6PK STRL BLUE (TOWEL DISPOSABLE) ×2 IMPLANT
TRAY FOLEY CATH 14FR (SET/KITS/TRAYS/PACK) ×2 IMPLANT
WATER STERILE IRR 1000ML POUR (IV SOLUTION) ×2 IMPLANT

## 2014-05-22 NOTE — Anesthesia Preprocedure Evaluation (Signed)
Anesthesia Evaluation  Patient identified by MRN, date of birth, ID band Patient awake    Reviewed: Allergy & Precautions, H&P , NPO status , Patient's Chart, lab work & pertinent test results  Airway Mallampati: III  TM Distance: >3 FB Neck ROM: Full    Dental no notable dental hx. (+) Teeth Intact   Pulmonary former smoker,  breath sounds clear to auscultation  Pulmonary exam normal       Cardiovascular hypertension, Pt. on medications and Pt. on home beta blockers Rhythm:Regular Rate:Normal     Neuro/Psych PSYCHIATRIC DISORDERS Anxiety Depression Bipolar Disorder    GI/Hepatic Neg liver ROS, GERD-  Medicated and Controlled,  Endo/Other  Morbid obesity  Renal/GU negative Renal ROS  negative genitourinary   Musculoskeletal   Abdominal (+) + obese,   Peds  Hematology  (+) anemia ,   Anesthesia Other Findings   Reproductive/Obstetrics (+) Pregnancy Previous C/Section                             Anesthesia Physical Anesthesia Plan  ASA: III  Anesthesia Plan: Spinal   Post-op Pain Management:    Induction: Intravenous  Airway Management Planned: Natural Airway  Additional Equipment:   Intra-op Plan:   Post-operative Plan:   Informed Consent: I have reviewed the patients History and Physical, chart, labs and discussed the procedure including the risks, benefits and alternatives for the proposed anesthesia with the patient or authorized representative who has indicated his/her understanding and acceptance.     Plan Discussed with: CRNA, Anesthesiologist and Surgeon  Anesthesia Plan Comments:         Anesthesia Quick Evaluation

## 2014-05-22 NOTE — Lactation Note (Signed)
This note was copied from the chart of Suzanne Stevey Gozick-Finlay. Lactation Consultation Note  Initial visit done.  Breastfeeding consultation services and support information given and reviewed.  Mom states baby is sleepy and she desires to begin pumping due to low milk supply with first baby.  She states baby would never latch and she attempted to pump but never obtained more than 60 mls/24 hours.  DEBP initiated and mom will post pump/hand express every 3 hours.  Instructed to watch for feeding cues and call for assist prn.  Mom has very large breasts and will need assist with positioning and latch.  Patient Name: Suzanne Wilcox ZOXWR'UToday's Date: 05/22/2014 Reason for consult: Initial assessment;Other (Comment)   Maternal Data Has patient been taught Hand Expression?: Yes Does the patient have breastfeeding experience prior to this delivery?: Yes  Feeding Feeding Type: Breast Fed Length of feed:  (a few sucks)  LATCH Score/Interventions Latch: Repeated attempts needed to sustain latch, nipple held in mouth throughout feeding, stimulation needed to elicit sucking reflex. Intervention(s): Assist with latch;Breast massage;Breast compression  Audible Swallowing: None Intervention(s): Skin to skin  Type of Nipple: Everted at rest and after stimulation  Comfort (Breast/Nipple): Soft / non-tender     Hold (Positioning): Full assist, staff holds infant at breast Intervention(s): Breastfeeding basics reviewed;Support Pillows;Position options;Skin to skin  LATCH Score: 5  Lactation Tools Discussed/Used WIC Program: Yes Pump Review: Setup, frequency, and cleaning Initiated by:: LMoulden RN IBCLC Date initiated:: 05/22/14   Consult Status Consult Status: Follow-up Date: 05/23/14 Follow-up type: In-patient    Huston FoleyMOULDEN, Camiyah Friberg S 05/22/2014, 3:19 PM

## 2014-05-22 NOTE — Op Note (Signed)
Cesarean Section Procedure Note  Indications: malpresentation: footling breech, oligohydramnios and previous uterine incision kerr x one  CHTN with exacerbation  Pre-operative Diagnosis: 38 week 5 day pregnancy.  Post-operative Diagnosis: same  Surgeon: Lenoard AdenAAVON,Icelyn Navarrete J   Assistants: Kathi LudwigBailey, CNM, FACM  Anesthesia: Local anesthesia 0.25.% bupivacaine and Spinal anesthesia  ASA Class: 3  Procedure Details  The patient was seen in the Holding Room. The risks, benefits, complications, treatment options, and expected outcomes were discussed with the patient.  The patient concurred with the proposed plan, giving informed consent. The risks of anesthesia, infection, bleeding and possible injury to other organs discussed. Injury to bowel, bladder, or ureter with possible need for repair discussed. Possible need for transfusion with secondary risks of hepatitis or HIV acquisition discussed. Post operative complications to include but not limited to DVT, PE and Pneumonia noted. The site of surgery properly noted/marked. The patient was taken to Operating Room # 9, identified as Suzanne Wilcox and the procedure verified as C-Section Delivery. A Time Out was held and the above information confirmed.  After induction of anesthesia, the patient was draped and prepped in the usual sterile manner. A Pfannenstiel incision was made and carried down through the subcutaneous tissue to the fascia. Fascial incision was made and extended transversely using Mayo scissors. The fascia was separated from the underlying rectus tissue superiorly and inferiorly. The peritoneum was identified and entered. Peritoneal incision was extended longitudinally. The utero-vesical peritoneal reflection was incised transversely and the bladder flap was bluntly freed from the lower uterine segment. A low transverse uterine incision(Kerr hysterotomy) was made. Delivered from footing breech presentation was a  Female using standard  maneuvers with Apgar scores of 8 at one minute and 9 at five minutes. Bulb suctioning gently performed. Neonatal team in attendance.After the umbilical cord was clamped and cut cord blood was obtained for evaluation. The placenta was removed intact and appeared normal. The uterus was curetted with a dry lap pack. Good hemostasis was noted.The uterine outline, tubes and ovaries appeared normal. The uterine incision was closed with running locked sutures of 0 Monocryl x 2 layers. Hemostasis was observed.  The fascia was then reapproximated with running sutures of 0 Monocryl. The skin was reapproximated with staples after Donnelly closure with 2-0 plain.  Instrument, sponge, and needle counts were correct prior the abdominal closure and at the conclusion of the case.   Findings: Omental adhesions to anterior abdominal wall  Estimated Blood Loss:  300 mL         Drains: foley                 Specimens: placenta                 Complications:  None; patient tolerated the procedure well.         Disposition: PACU - hemodynamically stable.         Condition: stable  Attending Attestation: I performed the procedure.

## 2014-05-22 NOTE — Transfer of Care (Signed)
Immediate Anesthesia Transfer of Care Note  Patient: Suzanne Wilcox  Procedure(s) Performed: Procedure(s) with comments: Repeat CESAREAN SECTION (N/A) - EDD: 05/31/14  Patient Location: PACU  Anesthesia Type:Spinal  Level of Consciousness: awake, alert  and oriented  Airway & Oxygen Therapy: Patient Spontanous Breathing  Post-op Assessment: Report given to PACU RN and Post -op Vital signs reviewed and stable  Post vital signs: Reviewed and stable  Complications: No apparent anesthesia complications

## 2014-05-22 NOTE — Progress Notes (Signed)
Patient ID: Suzanne Wilcox, female   DOB: 11/27/1975, 38 y.o.   MRN: [id number] Patient seen and examined. Consent witnessed and signed. No changes noted. Update completed.

## 2014-05-22 NOTE — Anesthesia Procedure Notes (Signed)
Spinal Patient location during procedure: OR Start time: 05/22/2014 10:19 AM Staffing Anesthesiologist: Trejon Duford A. Performed by: anesthesiologist  Preanesthetic Checklist Completed: patient identified, site marked, surgical consent, pre-op evaluation, timeout performed, IV checked, risks and benefits discussed and monitors and equipment checked Spinal Block Patient position: sitting Prep: site prepped and draped and DuraPrep Patient monitoring: heart rate, cardiac monitor, continuous pulse ox and blood pressure Approach: midline Location: L3-4 Injection technique: single-shot Needle Needle type: Sprotte  Needle gauge: 24 G Needle length: 9 cm Needle insertion depth: 8 cm Assessment Sensory level: T3 Additional Notes Patient tolerated procedure well. Adequate sensory level.

## 2014-05-22 NOTE — Anesthesia Postprocedure Evaluation (Signed)
  Anesthesia Post-op Note  Anesthesia Post Note  Patient: Suzanne Wilcox  Procedure(s) Performed: Procedure(s) (LRB): Repeat CESAREAN SECTION (N/A)  Anesthesia type: Spinal  Patient location: Mother/Baby  Post pain: Pain level controlled  Post assessment: Post-op Vital signs reviewed  Last Vitals:  Filed Vitals:   05/22/14 1914  BP: 168/99  Pulse: 105  Temp:   Resp: 22    Post vital signs: Reviewed  Level of consciousness: awake  Complications: No apparent anesthesia complications

## 2014-05-22 NOTE — Anesthesia Postprocedure Evaluation (Signed)
Anesthesia Post Note  Patient: Suzanne Wilcox  Procedure(s) Performed: Procedure(s) (LRB): Repeat CESAREAN SECTION (N/A)  Anesthesia type: Spinal  Patient location: PACU  Post pain: Pain level controlled  Post assessment: Post-op Vital signs reviewed  Last Vitals:  Filed Vitals:   05/22/14 1230  BP: 140/97  Pulse: 76  Temp:   Resp: 12    Post vital signs: Reviewed  Level of consciousness: awake  Complications: No apparent anesthesia complications

## 2014-05-22 NOTE — Plan of Care (Signed)
Problem: Phase II Progression Outcomes Goal: Progress activity as tolerated unless otherwise ordered Outcome: Completed/Met Date Met:  05/22/14     

## 2014-05-22 NOTE — Plan of Care (Signed)
Problem: Phase I Progression Outcomes Goal: Foley catheter patent Outcome: Completed/Met Date Met:  05/22/14

## 2014-05-22 NOTE — Addendum Note (Signed)
Addendum  created 05/22/14 1934 by Turner DanielsJennifer L Ravi Tuccillo, CRNA   Modules edited: Notes Section   Notes Section:  File: [id number]

## 2014-05-22 NOTE — Plan of Care (Signed)
Problem: Phase II Progression Outcomes Goal: Tolerating diet Outcome: Completed/Met Date Met:  05/22/14

## 2014-05-22 NOTE — Progress Notes (Signed)
Dr Juliene PinaMody notified that pt has decreased output.  Bolus ordered

## 2014-05-22 NOTE — Plan of Care (Signed)
Problem: Phase I Progression Outcomes Goal: Initial discharge plan identified Outcome: Completed/Met Date Met:  05/22/14

## 2014-05-23 ENCOUNTER — Encounter (HOSPITAL_COMMUNITY): Payer: Self-pay | Admitting: Obstetrics and Gynecology

## 2014-05-23 LAB — CBC
HEMATOCRIT: 31.6 % — AB (ref 36.0–46.0)
HEMOGLOBIN: 10.6 g/dL — AB (ref 12.0–15.0)
MCH: 29.5 pg (ref 26.0–34.0)
MCHC: 33.5 g/dL (ref 30.0–36.0)
MCV: 88 fL (ref 78.0–100.0)
Platelets: 198 10*3/uL (ref 150–400)
RBC: 3.59 MIL/uL — AB (ref 3.87–5.11)
RDW: 14.5 % (ref 11.5–15.5)
WBC: 8.8 10*3/uL (ref 4.0–10.5)

## 2014-05-23 NOTE — Progress Notes (Signed)
Clinical Social Work Department PSYCHOSOCIAL ASSESSMENT - MATERNAL/CHILD 05/23/2014  Patient:  Suzanne Wilcox, Suzanne Wilcox  Account Number:  [id number]  Admit Date:  05/22/2014  Ardine Eng Name:   Thea Gist   Clinical Social Worker:  Lucita Ferrara, CLINICAL SOCIAL WORKER   Date/Time:  05/23/2014 02:30 PM  Date Referred:  05/22/2014   Referral source  Central Nursery     Referred reason  Behavioral Health Issues   Other referral source:    I:  FAMILY / HOME ENVIRONMENT Child's legal guardian:  PARENT  Guardian - Name Guardian - Age [place], [place] [phone]  Thomasene Mohair  same as above   Other household support members/support persons Name Relationship DOB  Josetta Huddle 46 years old   STEPSON 16 years old   Other support:   MOB shared belief that she is well supported by the FOB. She also stated that her sister is a Engineer, water and is involved in her life.    II  PSYCHOSOCIAL DATA Information Source:  Patient Interview  Insurance risk surveyor Resources Employment:   MOB stated that she is employed at [employer].  The FOB is on disability, per MOB.   Financial resources:  Multimedia programmer If [place]:    School / Grade:  N/A Music therapist / Child Services Coordination / Early Interventions:   None reported  Cultural issues impacting care:   None reported    III  STRENGTHS Strengths  Adequate Resources  Home prepared for Child (including basic supplies)  Understanding of illness  Supportive family/friends   Strength comment:  MOB has already established plans to address hightened risk for postpartum depression.   IV  RISK FACTORS AND CURRENT PROBLEMS Current Problem:  YES   Risk Factor & Current Problem Patient Issue Family Issue Risk Factor / Current Problem Comment  Mental Illness Y N MOB presents with history of bipolar, depression, and anxiety.  She is currently prescribed  Wellbutrin and Seroquel, and is closely followed by Dr. Toy Care, outpatient psychiatrist.    V  SOCIAL WORK ASSESSMENT CSW met with the MOB due to MOB presenting with a history of bipolar, depression, and anxiety.  MOB expressed intense pain which limited her participation in the assessment and interaction with the baby.  MOB displayed limited range in affect but was polite and respectful despite physical pain.  MOB presents with insight and self-awareness secondary to her mental health needs, and discussed plans to prepare for potential postpartum depression.    MOB shared that she is well supported by the FOB and her family.  She shared that she has been overwhelmed due to "family stress", but also discussed stress since she works and supports herself, the FOB, her daughter, and stepson.  MOB stated that she has noted anxiety throughout her pregnancy, but shared belief that is due to medications needing to change since she was pregnant. MOB expressed self-awareness as she stated that through the years she is able to detect when her anxiety, depression, and bipolar may be worsening.  She shared that she is openly able to talk to the FOB about her symptoms.  MOB denied any depression during the pregnancy, and shared that she primarily noted her anxiety.  MOB denied any panic attacks, and stated that it was primarily worrying about the multiple stressors in her life.   MOB openly discussed her mental health history. She stated that she was diagnosed with bipolar at age 29.  She shared belief that she was in "remission" until she entered the postpartum period 4 years ago. She stated that she was prescribed Zoloft due to her postpartum depression, but shared that this "triggered" a manic episode.  The MOB shared that it has been almost 3 years of searching for "the perfect cocktail" of medications that would stabilize her medications.  Per MOB, her symptoms stabilized once she was prescribed Wellbutrin, Seroquel,  Adderall, and Xanax PRN.  She stated that shortly thereafter, she learned that she was pregnant which caused her to need to discontinue the Adderall and Xanax.  MOB stated that Dr. Toy Care is her outpatient psychiatrist.  It is noted in the MOB's chart, that she also participated in the intensive outpatient program at [facility] in November 2013.  MOB expressed fear as she transitions into the postpartum period.  She stated that she is primarily fearful since her postpartum depression triggered her bipolar and subsequent mental health instability. MOB presented as proactive as she reflected upon her efforts to try to prepare for potential postpartum depression.  She stated that she has an appointment with her psychiatrist next week to re-assess medications, and shared that she has already initiated conversations with her OB about her concerns.   MOB denied additional questions or concerns.  She presents with insight on need to closely monitor her mood, and discussed motivation to address her symptoms in pro-active manner.  No barriers to discharge.    VI SOCIAL WORK PLAN Social Work Plan  No Further Intervention Required / No Barriers to Discharge   Type of pt/family education:   Postpartum depression   If child protective services report - county:   If child protective services report - date:   Information/referral to community resources comment:   MOB currently receives medication management from Dr. Toy Care. MOB denied having a therapist, but stated that she would seek out a therapist at Dr. Starleen Arms office if needed.   Other social work plan:   CSW to follow-up PRN.

## 2014-05-23 NOTE — Lactation Note (Signed)
This note was copied from the chart of Suzanne Wilcox. Lactation Consultation Note  Patient Name: Suzanne Laurine Blazeranya Gozick-Thrush HQION'GToday's Date: 05/23/2014 Reason for consult: Follow-up assessment but RN, Marcelino DusterMichelle states that this mom has decided to formula/bottle-feed and no longer wants to breastfeed.   Maternal Data    Feeding    LATCH Score/Interventions                      Lactation Tools Discussed/Used     Consult Status Consult Status: Complete    Lynda RainwaterBryant, Iraida Cragin Parmly 05/23/2014, 8:40 PM

## 2014-05-23 NOTE — Progress Notes (Signed)
Up to bathroom @ 1000 tolerated fair.  States has severe burning @ incision site  Ambulating slowly  Crying out when moving .  Patient informed of risks of not ambulating frequently  She states she understands.

## 2014-05-23 NOTE — Plan of Care (Signed)
Problem: Phase II Progression Outcomes Goal: Afebrile, VS remain stable Outcome: Completed/Met Date Met:  05/23/14  Problem: Discharge Progression Outcomes Goal: Tolerating diet Outcome: Completed/Met Date Met:  05/23/14 Goal: MMR given as ordered Outcome: Not Applicable Date Met:  08/90/97

## 2014-05-23 NOTE — Progress Notes (Signed)
POD # 1  Subjective: Pt reports feeling well/ Pain controlled with Motrin Tolerating po/ Foley d/c'd- due to void/ No n/v/ Flatus absent Activity: up with assistance Bleeding is light Newborn info:  Information for the patient's newborn:  Belinda FisherGozick-Laforest, Girl Kassadi [[id number]][[id number]]  female   Feeding: breast and bottle   Objective:  VS:  Filed Vitals:   05/22/14 2121 05/22/14 2330 05/23/14 0300 05/23/14 0630  BP: 127/63 118/69 124/50 120/54  Pulse: 91 90 70 90  Temp: 98.6 F (37 C) 97.9 F (36.6 C) 98.5 F (36.9 C) 97.9 F (36.6 C)  TempSrc: Axillary Oral Oral Oral  Resp: 18 22 18 22   SpO2: 98% 96% 98% 97%     I&O: Intake/Output      11/24 0701 - 11/25 0700 11/25 0701 - 11/26 0700   P.O. 1357    I.V. 4758.5    Total Intake 6115.5     Urine 1225    Blood 700    Total Output 1925     Net +4190.5             Recent Labs  05/21/14 0840 05/23/14 0544  WBC 9.7 8.8  HGB 12.5 10.6*  HCT 38.0 31.6*  PLT 258 198    Blood type: --/--/O POS, O POS (11/23 0840) Rubella: Immune (04/30 0000)    Physical Exam:  General: alert and cooperative CV: Regular rate and rhythm Resp: CTA bilaterally Abdomen: soft, nontender, normal bowel sounds Incision: pressure dsg c/d/i Uterine Fundus: firm, below umbilicus, nontender Lochia: minimal Ext: edema trace BLE and Homans sign is negative, no sign of DVT    Assessment: POD # 1/ [id number]/ S/P C/Section d/t repeat, malpresentation  Chronic HTN-stable ABL anemia Doing well  Plan: Ambulate Continue routine post op orders Hx Bipolar and Depression, watch closely pp   Signed: Lawernce PittsBHAMBRI, Eliasar Hlavaty, N, MSN, CNM 05/23/2014, 12:01 PM

## 2014-05-23 NOTE — Plan of Care (Signed)
Problem: Phase II Progression Outcomes Goal: Rh isoimmunization per orders Outcome: Not Applicable Date Met:  30/13/14

## 2014-05-23 NOTE — Plan of Care (Signed)
Problem: Phase I Progression Outcomes Goal: Voiding adequately Outcome: Completed/Met Date Met:  05/23/14

## 2014-05-23 NOTE — Plan of Care (Signed)
Problem: Phase I Progression Outcomes Goal: Pain controlled with appropriate interventions Outcome: Completed/Met Date Met:  05/23/14 Goal: OOB as tolerated unless otherwise ordered Outcome: Completed/Met Date Met:  05/23/14 Goal: IS, TCDB as ordered Outcome: Completed/Met Date Met:  05/23/14 Goal: VS, stable, temp < 100.4 degrees F Outcome: Completed/Met Date Met:  05/23/14 Goal: Other Phase I Outcomes/Goals Outcome: Not Applicable Date Met:  01/16/81

## 2014-05-24 NOTE — Plan of Care (Signed)
Problem: Phase II Progression Outcomes Goal: Pain controlled on oral analgesia Outcome: Completed/Met Date Met:  05/24/14 Goal: Incision intact & without signs/symptoms of infection Outcome: Completed/Met Date Met:  05/24/14  Problem: Discharge Progression Outcomes Goal: Barriers To Progression Addressed/Resolved Outcome: Completed/Met Date Met:  05/24/14 Goal: Activity appropriate for discharge plan Outcome: Completed/Met Date Met:  05/24/14 Goal: Pain controlled with appropriate interventions Outcome: Completed/Met Date Met:  05/24/14 Goal: Afebrile, VS remain stable at discharge Outcome: Completed/Met Date Met:  05/24/14

## 2014-05-24 NOTE — Plan of Care (Signed)
Problem: Consults Goal: Skin Care Protocol Initiated - if Braden Score 18 or less If consults are not indicated, leave blank or document N/A  Outcome: Not Applicable Date Met:  49/20/10 Goal: Nutrition Consult-if indicated Outcome: Not Applicable Date Met:  01/07/18

## 2014-05-24 NOTE — Progress Notes (Addendum)
POD # 2  Subjective: Pt reports feeling well/ Pain controlled with Motrin Tolerating po/ Foley d/c'd- due to void/ No n/v/ Flatus present Activity: up with assistance Bleeding is light Newborn info:  Information for the patient's newborn:  Belinda FisherGozick-Markin, Girl Kelsay [[id number]][[id number]]  female    Feeding: breast and bottle   Objective:  VS:  Filed Vitals:   05/23/14 1745 05/24/14 0011 05/24/14 0525 05/24/14 1035  BP: 150/62 139/73 132/73 144/61  Pulse: 109 115 104 105  Temp: 98.5 F (36.9 C)  98.4 F (36.9 C)   TempSrc: Oral  Oral   Resp: 21  20   SpO2:         I&O: Intake/Output      11/25 0701 - 11/26 0700 11/26 0701 - 11/27 0700   P.O. 716    I.V. 1000    Total Intake 1716     Urine 575    Blood     Total Output 575     Net +1141             Recent Labs  05/23/14 0544  WBC 8.8  HGB 10.6*  HCT 31.6*  PLT 198    Blood type: O POS (11/23 0840) Rubella: Immune (04/30 0000)    Physical Exam:  General: alert, cooperative and morbidly obese CV: Regular rate and rhythm Resp: CTA bilaterally Abdomen: soft, nontender, normal bowel sounds Incision: pressure dsg c/d/i Uterine Fundus: firm, below umbilicus, nontender  Lochia: minimal Ext: edema trace BLE and Homans sign is negative, no sign of DVT    Assessment: POD # 2 / [id number] / S/P C/Section d/t repeat, malpresentation  Chronic HTN-stable ABL anemia  Doing well  Plan: Ambulate Continue routine post op orders Hx Bipolar and Depression, watch closely pp Anticipate D/C home tomorrow  Signed: Kenard GowerAWSON, Jimmy Stipes, M, MSN, CNM 05/24/2014, 10:55 AM

## 2014-05-24 NOTE — Lactation Note (Signed)
This note was copied from the chart of Suzanne Sejal Gozick-Iodice. Lactation Consultation Note Follow up visit at 60 hours of age. MBU RN reports mom having engorgement and needs LC visit per mom's request.  Previous not states mom has chosen  To formula bottle feed baby.  Last latch attempt was >40 hours prior. Day RN unaware of pumping attempts.  Upon visit mom is in bed with baby on lap trying to change a stooled diaper as baby continued to stool with diaper off.  Mom is anxious and having difficulty caring for baby in bed with her.  Encouraged mom to attempt next diaper change with baby in crib, mom not willing.  Mom reports she wants to breast feed, but she can't latch baby because her breast are too big and she can't see baby.  Mom is largely obese and does not seem motivated.  Mom reports pumping once today and it hurt so she stopped.  Mom does not act motivated to pump, but is requesting needs that can not be done for her.  Encouraged mom to decide if she wants to pump every 3 hours for 15-20 minutes after ice for 20 minutes following engorgement care of if she wants to stop and use tight fitting bra and maybe cabbage.  Mom anxious and conflicted with decision at this time.  [facility] not [facility] at this time due to current circumstances, but available if we can assist further when mom is motivated to follow plan of care.  Mom appears overwhelmed with basic newborn care at this time.     Patient Name: Suzanne Wilcox WUJWJ'XToday's Date: 05/24/2014     Maternal Data    Feeding Feeding Type: Formula Nipple Type: Slow - flow  LATCH Score/Interventions                Intervention(s): Breastfeeding basics reviewed     Lactation Tools Discussed/Used     Consult Status Consult Status: Complete    Shoptaw, Arvella MerlesJana Lynn 05/24/2014, 11:01 PM

## 2014-05-25 LAB — TYPE AND SCREEN
ABO/RH(D): O POS
Antibody Screen: NEGATIVE
UNIT DIVISION: 0
UNIT DIVISION: 0
UNIT DIVISION: 0
Unit division: 0

## 2014-05-25 MED ORDER — OXYCODONE-ACETAMINOPHEN 5-325 MG PO TABS: 1.0000 | ORAL_TABLET | ORAL | Status: DC | PRN

## 2014-05-25 MED ORDER — IBUPROFEN 600 MG PO TABS: 600.0000 mg | ORAL_TABLET | Freq: Four times a day (QID) | ORAL | Status: DC

## 2014-05-25 NOTE — Discharge Instructions (Signed)
Breast Pumping Tips °If you are breastfeeding, there may be times when you cannot feed your baby directly. Returning to work or going on a trip are common examples. Pumping allows you to store breast milk and feed it to your baby later.  °You may not get much milk when you first start to pump. Your breasts should start to make more after a few days. If you pump at the times you usually feed your baby, you may be able to keep making enough milk to feed your baby without also using formula. The more often you pump, the more milk you will produce.  °WHEN SHOULD I PUMP?  °· You can begin to pump soon after delivery. However, some experts recommend waiting about 4 weeks before giving your infant a bottle to make sure breastfeeding is going well.  °· If you plan to return to work, begin pumping a few weeks before. This will help you develop techniques that work best for you. It also lets you build up a supply of breast milk.   °· When you are with your infant, feed on demand and pump after each feeding.   °· When you are away from your infant for several hours, pump for about 15 minutes every 2-3 hours. Pump both breasts at the same time if you can.   °· If your infant has a formula feeding, make sure to pump around the same time.     °· If you drink any alcohol, wait 2 hours before pumping.   °HOW DO I PREPARE TO PUMP? °Your let-down reflex is the natural reaction to stimulation that makes your breast milk flow. It is easier to stimulate this reflex when you are relaxed. Find relaxation techniques that work for you. If you have difficulty with your let-down reflex, try these methods:  °· Smell one of your infant's blankets or an item of clothing.   °· Look at a picture or video of your infant.   °· Sit in a quiet, private space.   °· Massage the breast you plan to pump.   °· Place soothing warmth on the breast.   °· Play relaxing music.   °WHAT ARE SOME GENERAL BREAST PUMPING TIPS? °· Wash your hands before you pump. You  do not need to wash your nipples or breasts. °· There are three ways to pump. °¨ You can use your hand to massage and compress your breast. °¨ You can use a handheld manual pump. °¨ You can use an electric pump.   °· Make sure the suction cup (flange) on the breast pump is the right size. Place the flange directly over the nipple. If it is the wrong size or placed the wrong way, it may be painful and cause nipple damage.   °· If pumping is uncomfortable, apply a small amount of purified or modified lanolin to your nipple and areola. °· If you are using an electric pump, adjust the speed and suction power to be more comfortable. °· If pumping is painful or if you are not getting very much milk, you may need a different type of pump. A lactation consultant can help you determine what type of pump to use.   °· Keep a full water bottle near you at all times. Drinking lots of fluid helps you make more milk.  °· You can store your milk to use later. Pumped breast milk can be stored in a sealable, sterile container or plastic bag. Label all stored breast milk with the date you pumped it. °¨ Milk can stay out at room temperature for up to 8 hours. °¨   You can store your milk in the refrigerator for up to 8 days. °¨ You can store your milk in the freezer for 3 months. Thaw frozen milk using warm water. Do not put it in the microwave. °· Do not smoke. Smoking can lower your milk supply and harm your infant. If you need help quitting, ask your health care provider to recommend a program.   °WHEN SHOULD I CALL MY HEALTH CARE PROVIDER OR A LACTATION CONSULTANT? °· You are having trouble pumping. °· You are concerned that you are not making enough milk. °· You have nipple pain, soreness, or redness. °· You want to use birth control. Birth control pills may lower your milk supply. Talk to your health care provider about your options. °Document Released: 12/03/2009 Document Revised: 06/20/2013 Document Reviewed:  04/07/2013 °Exit[employer]® Patient Information ©2015 [employer]. This information is not intended to replace advice given to you by your health care provider. Make sure you discuss any questions you have with your health care provider. ° °Nutrition for the New Mother  °A new mother needs good health and nutrition so she can have energy to take care of a new baby. Whether a mother breastfeeds or formula feeds the baby, it is important to have a well-balanced diet. Foods from all the food groups should be chosen to meet the new mother's energy needs and to give her the nutrients needed for repair and healing.  °A HEALTHY EATING PLAN °The My [employer] plan for Moms outlines what you should eat to help you and your baby stay healthy. The energy and amount of food you need depends on whether or not you are breastfeeding. If you are breastfeeding you will need more nutrients. If you choose not to breastfeed, your nutrition goal should be to return to a healthy weight. Limiting calories may be needed if you are not breastfeeding.  °HOME CARE INSTRUCTIONS  °· For a personal plan based on your unique needs, see your Registered Dietitian or visit www.mypyramid.gov. °· Eat a variety of foods. The plan below will help guide you. The following chart has a suggested daily meal plan from the [employer] for Moms. °· Eat a variety of fruits and vegetables. °· Eat more dark green and orange vegetables and cooked dried beans. °· Make half your grains whole grains. Choose whole instead of refined grains. °· Choose low-fat or lean meats and poultry. °· Choose low-fat or fat-free dairy products like milk, cheese, or yogurt. °Fruits °· Breastfeeding: 2 cups °· Non-Breastfeeding: 2 cups °· What Counts as a serving? °¨ 1 cup of fruit or juice. °¨ ½ cup dried fruit. °Vegetables °· Breastfeeding: 3 cups °· Non-Breastfeeding: 2 ½ cups °· What Counts as a serving? °¨ 1 cup raw or cooked vegetables. °¨ Juice or 2 cups raw leafy  vegetables. °Grains °· Breastfeeding: 8 oz °· Non-Breastfeeding: 6 oz °· What Counts as a serving? °¨ 1 slice bread. °¨ 1 oz ready-to-eat cereal. °¨ ½ cup cooked pasta, rice, or cereal. °Meat and Beans °· Breastfeeding: 6 ½ oz °· Non-Breastfeeding: 5 ½ oz °· What Counts as a serving? °¨ 1 oz lean meat, poultry, or fish °¨ ¼ cup cooked dry beans °¨ ½ oz nuts or 1 egg °¨ 1 tbs peanut butter °Milk °· Breastfeeding: 3 cups °· Non-Breastfeeding: 3 cups °· What Counts as a serving? °¨ 1 cup milk. °¨ 8 oz yogurt. °¨ 1 ½ oz cheese. °¨ 2 oz processed cheese. °TIPS FOR THE BREASTFEEDING MOM °· Rapid weight   loss is not suggested when you are breastfeeding. By simply breastfeeding, you will be able to lose the weight gained during your pregnancy. Your caregiver can keep track of your weight and tell you if your weight loss is appropriate. °· Be sure to drink fluids. You may notice that you are thirstier than usual. A suggestion is to drink a glass of water or other beverage whenever you breastfeed. °· Avoid alcohol as it can be passed into your breast milk. °· Limit caffeine drinks to no more than 2 to 3 cups per day. °· You may need to keep taking your prenatal vitamin while you are breastfeeding. Talk with your caregiver about taking a vitamin or supplement. °RETURING TO A HEALTHY WEIGHT °· The My [employer] Plan for Moms will help you return to a healthy weight. It will also provide the nutrients you need. °· You may need to limit "empty" calories. These include: °¨ High fat foods like fried foods, fatty meats, fast food, butter, and mayonnaise. °¨ High sugar foods like sodas, jelly, candy, and sweets. °· Be physically active. Include 30 minutes of exercise or more each day. Choose an activity you like such as walking, swimming, biking, or aerobics. Check with your caregiver before you start to exercise. °Document Released: 09/22/2007 Document Revised: 09/07/2011 Document Reviewed: 09/22/2007 °ExitCare® Patient Information  ©2015 [employer]. This information is not intended to replace advice given to you by your health care provider. Make sure you discuss any questions you have with your health care provider. °Postpartum Depression and Baby Blues °The postpartum period begins right after the birth of a baby. During this time, there is often a great amount of joy and excitement. It is also a time of many changes in the life of the parents. Regardless of how many times a mother gives birth, each child brings new challenges and dynamics to the family. It is not unusual to have feelings of excitement along with confusing shifts in moods, emotions, and thoughts. All mothers are at risk of developing postpartum depression or the "baby blues." These mood changes can occur right after giving birth, or they may occur many months after giving birth. The baby blues or postpartum depression can be mild or severe. Additionally, postpartum depression can go away rather quickly, or it can be a long-term condition.  °CAUSES °Raised hormone levels and the rapid drop in those levels are thought to be a main cause of postpartum depression and the baby blues. A number of hormones change during and after pregnancy. Estrogen and progesterone usually decrease right after the delivery of your baby. The levels of thyroid hormone and various cortisol steroids also rapidly drop. Other factors that play a role in these mood changes include major life events and genetics.  °RISK FACTORS °If you have any of the following risks for the baby blues or postpartum depression, know what symptoms to watch out for during the postpartum period. Risk factors that may increase the likelihood of getting the baby blues or postpartum depression include: °· Having a personal or family history of depression.   °· Having depression while being pregnant.   °· Having premenstrual mood issues or mood issues related to oral contraceptives. °· Having a lot of life stress.   °· Having  marital conflict.   °· Lacking a social support network.   °· Having a baby with special needs.   °· Having health problems, such as diabetes.   °SIGNS AND SYMPTOMS °Symptoms of baby blues include: °· Brief changes in mood, such as going   from extreme happiness to sadness. °· Decreased concentration.   °· Difficulty sleeping.   °· Crying spells, tearfulness.   °· Irritability.   °· Anxiety.   °Symptoms of postpartum depression typically begin within the first month after giving birth. These symptoms include: °· Difficulty sleeping or excessive sleepiness.   °· Marked weight loss.   °· Agitation.   °· Feelings of worthlessness.   °· Lack of interest in activity or food.   °Postpartum psychosis is a very serious condition and can be dangerous. Fortunately, it is rare. Displaying any of the following symptoms is cause for immediate medical attention. Symptoms of postpartum psychosis include:  °· Hallucinations and delusions.   °· Bizarre or disorganized behavior.   °· Confusion or disorientation.   °DIAGNOSIS  °A diagnosis is made by an evaluation of your symptoms. There are no medical or lab tests that lead to a diagnosis, but there are various questionnaires that a health care provider may use to identify those with the baby blues, postpartum depression, or psychosis. Often, a screening tool called the Edinburgh Postnatal Depression Scale is used to diagnose depression in the postpartum period.  °TREATMENT °The baby blues usually goes away on its own in 1-2 weeks. Social support is often all that is needed. You will be encouraged to get adequate sleep and rest. Occasionally, you may be given medicines to help you sleep.  °Postpartum depression requires treatment because it can last several months or longer if it is not treated. Treatment may include individual or group therapy, medicine, or both to address any social, physiological, and psychological factors that may play a role in the depression. Regular exercise, a  healthy diet, rest, and social support may also be strongly recommended.  °Postpartum psychosis is more serious and needs treatment right away. Hospitalization is often needed. °HOME CARE INSTRUCTIONS °· Get as much rest as you can. Nap when the baby sleeps.   °· Exercise regularly. Some women find yoga and walking to be beneficial.   °· Eat a balanced and nourishing diet.   °· Do little things that you enjoy. Have a cup of tea, take a bubble bath, read your favorite magazine, or listen to your favorite music. °· Avoid alcohol.   °· Ask for help with household chores, cooking, grocery shopping, or running errands as needed. Do not try to do everything.   °· Talk to people close to you about how you are feeling. Get support from your partner, family members, friends, or other new moms. °· Try to stay positive in how you think. Think about the things you are grateful for.   °· Do not spend a lot of time alone.   °· Only take over-the-counter or prescription medicine as directed by your health care provider. °· Keep all your postpartum appointments.   °· Let your health care provider know if you have any concerns.   °SEEK MEDICAL CARE IF: °You are having a reaction to or problems with your medicine. °SEEK IMMEDIATE MEDICAL CARE IF: °· You have suicidal feelings.   °· You think you may harm the baby or someone else. °MAKE SURE YOU: °· Understand these instructions. °· Will watch your condition. °· Will get help right away if you are not doing well or get worse. °Document Released: 03/19/2004 Document Revised: 06/20/2013 Document Reviewed: 03/27/2013 °ExitCare® Patient Information ©2015 [employer]. This information is not intended to replace advice given to you by your health care provider. Make sure you discuss any questions you have with your health care provider. °Breastfeeding and Mastitis °Mastitis is inflammation of the breast tissue. It can occur in women who   are breastfeeding. This can make breastfeeding  painful. Mastitis will sometimes go away on its own. Your health care provider will help determine if treatment is needed. °CAUSES °Mastitis is often associated with a blocked milk (lactiferous) duct. This can happen when too much milk builds up in the breast. Causes of excess milk in the breast can include: °· Poor latch-on. If your baby is not latched onto the breast properly, she or he may not empty your breast completely while breastfeeding. °· Allowing too much time to pass between feedings. °· Wearing a bra or other clothing that is too tight. This puts extra pressure on the lactiferous ducts so milk does not flow through them as it should. °Mastitis can also be caused by a bacterial infection. Bacteria may enter the breast tissue through cuts or openings in the skin. In women who are breastfeeding, this may occur because of cracked or irritated skin. Cracks in the skin are often caused when your baby does not latch on properly to the breast. °SIGNS AND SYMPTOMS °· Swelling, redness, tenderness, and pain in an area of the breast. °· Swelling of the glands under the arm on the same side. °· Fever may or may not accompany mastitis. °If an infection is allowed to progress, a collection of pus (abscess) may develop. °DIAGNOSIS  °Your health care provider can usually diagnose mastitis based on your symptoms and a physical exam. Tests may be done to help confirm the diagnosis. These may include: °· Removal of pus from the breast by applying pressure to the area. This pus can be examined in the lab to determine which bacteria are present. If an abscess has developed, the fluid in the abscess can be removed with a needle. This can also be used to confirm the diagnosis and determine the bacteria present. In most cases, pus will not be present. °· Blood tests to determine if your body is fighting a bacterial infection. °· Mammogram or ultrasound tests to rule out other problems or diseases. °TREATMENT  °Mastitis that  occurs with breastfeeding will sometimes go away on its own. Your health care provider may choose to wait 24 hours after first seeing you to decide whether a prescription medicine is needed. If your symptoms are worse after 24 hours, your health care provider will likely prescribe an antibiotic medicine to treat the mastitis. He or she will determine which bacteria are most likely causing the infection and will then select an appropriate antibiotic medicine. This is sometimes changed based on the results of tests performed to identify the bacteria, or if there is no response to the antibiotic medicine selected. Antibiotic medicines are usually given by mouth. You may also be given medicine for pain. °HOME CARE INSTRUCTIONS °· Only take over-the-counter or prescription medicines for pain, fever, or discomfort as directed by your health care provider. °· If your health care provider prescribed an antibiotic medicine, take the medicine as directed. Make sure you finish it even if you start to feel better. °· Do not wear a tight or underwire bra. Wear a soft, supportive bra. °· Increase your fluid intake, especially if you have a fever. °· Continue to empty the breast. Your health care provider can tell you whether this milk is safe for your infant or needs to be thrown out. You may be told to stop nursing until your health care provider thinks it is safe for your baby. Use a breast pump if you are advised to stop nursing. °· Keep your nipples   clean and dry. °· Empty the first breast completely before going to the other breast. If your baby is not emptying your breasts completely for some reason, use a breast pump to empty your breasts. °· If you go back to work, pump your breasts while at work to stay in time with your nursing schedule. °· Avoid allowing your breasts to become overly filled with milk (engorged). °SEEK MEDICAL CARE IF: °· You have pus-like discharge from the breast. °· Your symptoms do not improve with  the treatment prescribed by your health care provider within 2 days. °SEEK IMMEDIATE MEDICAL CARE IF: °· Your pain and swelling are getting worse. °· You have pain that is not controlled with medicine. °· You have a red line extending from the breast toward your armpit. °· You have a fever or persistent symptoms for more than 2-3 days. °· You have a fever and your symptoms suddenly get worse. °MAKE SURE YOU:  °· Understand these instructions. °· Will watch your condition. °· Will get help right away if you are not doing well or get worse. °Document Released: 10/10/2004 Document Revised: 06/20/2013 Document Reviewed: 01/19/2013 °[employer]® Patient Information ©2015 [employer]. This information is not intended to replace advice given to you by your health care provider. Make sure you discuss any questions you have with your health care provider. °Breastfeeding °Deciding to breastfeed is one of the best choices you can make for you and your baby. A change in hormones during pregnancy causes your breast tissue to grow and increases the number and size of your milk ducts. These hormones also allow proteins, sugars, and fats from your blood supply to make breast milk in your milk-producing glands. Hormones prevent breast milk from being released before your baby is born as well as prompt milk flow after birth. Once breastfeeding has begun, thoughts of your baby, as well as his or her sucking or crying, can stimulate the release of milk from your milk-producing glands.  °BENEFITS OF BREASTFEEDING °For Your Baby °· Your first milk (colostrum) helps your baby's digestive system function better.   °· There are antibodies in your milk that help your baby fight off infections.   °· Your baby has a lower incidence of asthma, allergies, and sudden infant death syndrome.   °· The nutrients in breast milk are better for your baby than infant formulas and are designed uniquely for your baby's needs.   °· Breast milk improves your  baby's brain development.   °· Your baby is less likely to develop other conditions, such as childhood obesity, asthma, or type 2 diabetes mellitus.   °For You  °· Breastfeeding helps to create a very special bond between you and your baby.   °· Breastfeeding is convenient. Breast milk is always available at the correct temperature and costs nothing.   °· Breastfeeding helps to burn calories and helps you lose the weight gained during pregnancy.   °· Breastfeeding makes your uterus contract to its prepregnancy size faster and slows bleeding (lochia) after you give birth.   °· Breastfeeding helps to lower your risk of developing type 2 diabetes mellitus, osteoporosis, and breast or ovarian cancer later in life. °SIGNS THAT YOUR BABY IS HUNGRY °Early Signs of Hunger  °· Increased alertness or activity. °· Stretching. °· Movement of the head from side to side. °· Movement of the head and opening of the mouth when the corner of the mouth or cheek is stroked (rooting). °· Increased sucking sounds, smacking lips, cooing, sighing, or squeaking. °· Hand-to-mouth movements. °· Increased sucking of   fingers or hands. °Late Signs of Hunger °· Fussing. °· Intermittent crying. °Extreme Signs of Hunger °Signs of extreme hunger will require calming and consoling before your baby will be able to breastfeed successfully. Do not wait for the following signs of extreme hunger to occur before you initiate breastfeeding:   °· Restlessness. °· A loud, strong cry. °·  Screaming. °BREASTFEEDING BASICS °Breastfeeding Initiation °· Find a comfortable place to sit or lie down, with your neck and back well supported. °· Place a pillow or rolled up blanket under your baby to bring him or her to the level of your breast (if you are seated). Nursing pillows are specially designed to help support your arms and your baby while you breastfeed. °· Make sure that your baby's abdomen is facing your abdomen.   °· Gently massage your breast. With your  fingertips, massage from your chest wall toward your nipple in a circular motion. This encourages milk flow. You may need to continue this action during the feeding if your milk flows slowly. °· Support your breast with 4 fingers underneath and your thumb above your nipple. Make sure your fingers are well away from your nipple and your baby's mouth.   °· Stroke your baby's lips gently with your finger or nipple.   °· When your baby's mouth is open wide enough, quickly bring your baby to your breast, placing your entire nipple and as much of the colored area around your nipple (areola) as possible into your baby's mouth.   °¨ More areola should be visible above your baby's upper lip than below the lower lip.   °¨ Your baby's tongue should be between his or her lower gum and your breast.   °· Ensure that your baby's mouth is correctly positioned around your nipple (latched). Your baby's lips should create a seal on your breast and be turned out (everted). °· It is common for your baby to suck about 2-3 minutes in order to start the flow of breast milk. °Latching °Teaching your baby how to latch on to your breast properly is very important. An improper latch can cause nipple pain and decreased milk supply for you and poor weight gain in your baby. Also, if your baby is not latched onto your nipple properly, he or she may swallow some air during feeding. This can make your baby fussy. Burping your baby when you switch breasts during the feeding can help to get rid of the air. However, teaching your baby to latch on properly is still the best way to prevent fussiness from swallowing air while breastfeeding. °Signs that your baby has successfully latched on to your nipple:    °· Silent tugging or silent sucking, without causing you pain.   °· Swallowing heard between every 3-4 sucks.   °·  Muscle movement above and in front of his or her ears while sucking.   °Signs that your baby has not successfully latched on to  nipple:  °· Sucking sounds or smacking sounds from your baby while breastfeeding. °· Nipple pain. °If you think your baby has not latched on correctly, slip your finger into the corner of your baby's mouth to break the suction and place it between your baby's gums. Attempt breastfeeding initiation again. °Signs of Successful Breastfeeding °Signs from your baby:   °· A gradual decrease in the number of sucks or complete cessation of sucking.   °· Falling asleep.   °· Relaxation of his or her body.   °· Retention of a small amount of milk in his or her mouth.   °· Letting go   of your breast by himself or herself. °Signs from you: °· Breasts that have increased in firmness, weight, and size 1-3 hours after feeding.   °· Breasts that are softer immediately after breastfeeding. °· Increased milk volume, as well as a change in milk consistency and color by the fifth day of breastfeeding.   °· Nipples that are not sore, cracked, or bleeding. °Signs That Your Baby is Getting Enough Milk °· Wetting at least 3 diapers in a 24-hour period. The urine should be clear and pale yellow by age 5 days. °· At least 3 stools in a 24-hour period by age 5 days. The stool should be soft and yellow. °· At least 3 stools in a 24-hour period by age 7 days. The stool should be seedy and yellow. °· No loss of weight greater than 10% of birth weight during the first 3 days of age. °· Average weight gain of 4-7 ounces (113-198 g) per week after age 4 days. °· Consistent daily weight gain by age 5 days, without weight loss after the age of 2 weeks. °After a feeding, your baby may spit up a small amount. This is common. °BREASTFEEDING FREQUENCY AND DURATION °Frequent feeding will help you make more milk and can prevent sore nipples and breast engorgement. Breastfeed when you feel the need to reduce the fullness of your breasts or when your baby shows signs of hunger. This is called "breastfeeding on demand." Avoid introducing a pacifier to your  baby while you are working to establish breastfeeding (the first 4-6 weeks after your baby is born). After this time you may choose to use a pacifier. Research has shown that pacifier use during the first year of a baby's life decreases the risk of sudden infant death syndrome (SIDS). °Allow your baby to feed on each breast as long as he or she wants. Breastfeed until your baby is finished feeding. When your baby unlatches or falls asleep while feeding from the first breast, offer the second breast. Because newborns are often sleepy in the first few weeks of life, you may need to awaken your baby to get him or her to feed. °Breastfeeding times will vary from baby to baby. However, the following rules can serve as a guide to help you ensure that your baby is properly fed: °· Newborns (babies 4 weeks of age or younger) may breastfeed every 1-3 hours. °· Newborns should not go longer than 3 hours during the day or 5 hours during the night without breastfeeding. °· You should breastfeed your baby a minimum of 8 times in a 24-hour period until you begin to introduce solid foods to your baby at around 6 months of age. °BREAST MILK PUMPING °Pumping and storing breast milk allows you to ensure that your baby is exclusively fed your breast milk, even at times when you are unable to breastfeed. This is especially important if you are going back to work while you are still breastfeeding or when you are not able to be present during feedings. Your lactation consultant can give you guidelines on how long it is safe to store breast milk.  °A breast pump is a machine that allows you to pump milk from your breast into a sterile bottle. The pumped breast milk can then be stored in a refrigerator or freezer. Some breast pumps are operated by hand, while others use electricity. Ask your lactation consultant which type will work best for you. Breast pumps can be purchased, but some hospitals and breastfeeding support groups   lease  breast pumps on a monthly basis. A lactation consultant can teach you how to hand express breast milk, if you prefer not to use a pump.  °CARING FOR YOUR BREASTS WHILE YOU BREASTFEED °Nipples can become dry, cracked, and sore while breastfeeding. The following recommendations can help keep your breasts moisturized and healthy: °· Avoid using soap on your nipples.   °· Wear a supportive bra. Although not required, special nursing bras and tank tops are designed to allow access to your breasts for breastfeeding without taking off your entire bra or top. Avoid wearing underwire-style bras or extremely tight bras. °· Air dry your nipples for 3-4 minutes after each feeding.   °· Use only cotton bra pads to absorb leaked breast milk. Leaking of breast milk between feedings is normal.   °· Use lanolin on your nipples after breastfeeding. Lanolin helps to maintain your skin's normal moisture barrier. If you use pure lanolin, you do not need to wash it off before feeding your baby again. Pure lanolin is not toxic to your baby. You may also hand express a few drops of breast milk and gently massage that milk into your nipples and allow the milk to air dry. °In the first few weeks after giving birth, some women experience extremely full breasts (engorgement). Engorgement can make your breasts feel heavy, warm, and tender to the touch. Engorgement peaks within 3-5 days after you give birth. The following recommendations can help ease engorgement: °· Completely empty your breasts while breastfeeding or pumping. You may want to start by applying warm, moist heat (in the shower or with warm water-soaked hand towels) just before feeding or pumping. This increases circulation and helps the milk flow. If your baby does not completely empty your breasts while breastfeeding, pump any extra milk after he or she is finished. °· Wear a snug bra (nursing or regular) or tank top for 1-2 days to signal your body to slightly decrease milk  production. °· Apply ice packs to your breasts, unless this is too uncomfortable for you. °· Make sure that your baby is latched on and positioned properly while breastfeeding. °If engorgement persists after 48 hours of following these recommendations, contact your health care provider or a lactation consultant. °OVERALL HEALTH CARE RECOMMENDATIONS WHILE BREASTFEEDING °· Eat healthy foods. Alternate between meals and snacks, eating 3 of each per day. Because what you eat affects your breast milk, some of the foods may make your baby more irritable than usual. Avoid eating these foods if you are sure that they are negatively affecting your baby. °· Drink milk, fruit juice, and water to satisfy your thirst (about 10 glasses a day).   °· Rest often, relax, and continue to take your prenatal vitamins to prevent fatigue, stress, and anemia. °· Continue breast self-awareness checks. °· Avoid chewing and smoking tobacco. °· Avoid alcohol and drug use. °Some medicines that may be harmful to your baby can pass through breast milk. It is important to ask your health care provider before taking any medicine, including all over-the-counter and prescription medicine as well as vitamin and herbal supplements. °It is possible to become pregnant while breastfeeding. If birth control is desired, ask your health care provider about options that will be safe for your baby. °SEEK MEDICAL CARE IF:  °· You feel like you want to stop breastfeeding or have become frustrated with breastfeeding. °· You have painful breasts or nipples. °· Your nipples are cracked or bleeding. °· Your breasts are red, tender, or warm. °· You have   a swollen area on either breast. °· You have a fever or chills. °· You have nausea or vomiting. °· You have drainage other than breast milk from your nipples. °· Your breasts do not become full before feedings by the fifth day after you give birth. °· You feel sad and depressed. °· Your baby is too sleepy to eat  well. °· Your baby is having trouble sleeping.   °· Your baby is wetting less than 3 diapers in a 24-hour period. °· Your baby has less than 3 stools in a 24-hour period. °· Your baby's skin or the white part of his or her eyes becomes yellow.   °· Your baby is not gaining weight by 5 days of age. °SEEK IMMEDIATE MEDICAL CARE IF:  °· Your baby is overly tired (lethargic) and does not want to wake up and feed. °· Your baby develops an unexplained fever. °Document Released: 06/15/2005 Document Revised: 06/20/2013 Document Reviewed: 12/07/2012 °Exit[employer]® Patient Information ©2015 [employer]. This information is not intended to replace advice given to you by your health care provider. Make sure you discuss any questions you have with your health care provider. ° °

## 2014-05-25 NOTE — Progress Notes (Addendum)
Patient ID: Suzanne Wilcox, female   DOB: 04/28/1976, 38 y.o.   MRN: [id number] Subjective: POD# 3 Information for the patient's newborn:  Brynda PeonGozick-Altidor, Girl Kenney Housemananya [[id number]][[id number]]  female  Reports feeling some soreness, but better than yesterday Feeding: breast and bottle Patient reports tolerating PO.  Breast symptoms: none Pain controlled with ibuprofen (OTC) and narcotic analgesics including Percocet / Rt side of incision is still a little sore, but better than yesterday Denies HA/SOB/C/P/N/V/dizziness. Flatus present. (+) BM. She reports vaginal bleeding as normal, without clots.  She is ambulating, urinating without difficult.     Objective:   VS:  Filed Vitals:   05/24/14 1529 05/24/14 1752 05/24/14 1959 05/25/14 0606  BP: 136/61 138/81  138/76  Pulse: 70 98  107  Temp:  98.1 F (36.7 C)  98.2 F (36.8 C)  TempSrc:  Oral  Oral  Resp:  18  18  Height:   5\' 8"  (1.727 m)   Weight:   180.985 kg (399 lb)   SpO2:        No intake or output data in the 24 hours ending 05/25/14 0919      Recent Labs  05/23/14 0544  WBC 8.8  HGB 10.6*  HCT 31.6*  PLT 198     Blood type: O POS (11/23 0840)  Rubella: Immune (04/30 0000)     Physical Exam:  General: alert, cooperative and no distress Abdomen: soft, nontender, normal bowel sounds Incision: Tegaderm and Honeycomb C/D/I - skin well-approximated with staples Uterine Fundus: firm, 2 FB below umbilicus, nontender Lochia: minimal Ext: edema 2+ and Homans sign is negative, no sign of DVT   Assessment/Plan: 38 y.o.   POD# 3.  s/p Cesarean Delivery.  Indications: Frank breech and repeat                Principal Problem:   Postpartum care following cesarean delivery (11/24) Active Problems:   Breech birth Depression Bipolar Disorder  Doing well, stable.               Regular diet as tolerated Ambulate Routine post-op care H/O depression and bipolar disorder - close F/U with Dr. Billy Coastaavon in 2 wks F/U in office for BP  check and staple removal in 1 wk D/C home today  Raelyn MoraAWSON, Tyneisha Hegeman, M, MSN, CNM 05/25/2014, 9:19 AM

## 2014-05-25 NOTE — Lactation Note (Signed)
This note was copied from the chart of Suzanne Wilcox. Lactation Consultation Note  Patient Name: Suzanne Wilcox [id number]'s Date: 05/25/2014 Reason for consult: Follow-up assessment  Mom requesting larger flanges. Mom given option of pumping for a few minutes with size 30 to be sure she doesn't need a size 36.  Mom declined.  Two size flanges provided.   Mom on Seroquel (L2) and Wellbutrin (L3).   Lurline HareRichey, Dory Verdun [facility] 05/25/2014, 11:32 AM

## 2014-05-25 NOTE — Plan of Care (Signed)
Problem: Phase II Progression Outcomes Goal: Other Phase II Outcomes/Goals Outcome: Not Applicable Date Met:  05/25/14     

## 2014-05-25 NOTE — Discharge Summary (Signed)
POSTOPERATIVE DISCHARGE SUMMARY:  Patient ID: Suzanne Wilcox MRN: [id number] DOB/AGE: 37/02/1976 37 y.o.  Admit date: 05/22/2014 Admission Diagnoses: 38.[redacted] wks gestation / Chronic hypertension with oligohydramnios, breech for repeat C-section.   Discharge date:  05/25/2014 Discharge Diagnoses: S[facility]P [facility] frank breech presentation and oligohydramnios on 05/22/2014        Prenatal history: [id number]   EDC : 05/31/2014, by Last Menstrual Period  Has received prenatal care at [facility] since [redacted] wks gestation. Primary provider : Dr. Billy Coastaavon Prenatal course complicated by morbid obesity / CHTN / breech / oligohydramnios / depression / bipolar disorder  Prenatal Labs: ABO, Rh:  O POS (11/23 0840)  Antibody: NEG (11/23 0840) Rubella: Immune (04/30 0000)   RPR: NON REAC (11/23 0840)  HBsAg: Negative (04/30 0000)  HIV: Non-reactive (04/30 0000)  GTT : Normal GBS:  Negative  Medical / Surgical History :  Past medical history:  Past Medical History  Diagnosis Date  . Obesity 04/29/2012  . Bipolar disorder   . Anxiety   . Depression   . Missed abortions     X 6  . Termination of pregnancy (fetus) 03/22/2004    X 1  . Hypertension   . GERD (gastroesophageal reflux disease)     Past surgical history:  Past Surgical History  Procedure Laterality Date  . Cesarean section  2011    X 1 [facility] - Taavon  . Dilation and curettage of uterus   2005, 2010      x2 MAB  . Wisdom tooth extraction    . Cesarean section N/A 05/22/2014    Procedure: Repeat CESAREAN SECTION;  Surgeon: Lenoard Adenichard J Taavon, MD;  Location: [facility];  Service: Obstetrics;  Laterality: N/A;  EDD: 05/31/14     Allergies: Review of patient's allergies indicates no known allergies.   Intrapartum Course:  Admited for repeat cesarean delivery by Dr. Billy Coastaavon / see operative report  Physical Exam:   VSS: Blood pressure 138/76, pulse 107, temperature 98.2 F (36.8 C), temperature source Oral,  resp. rate 18, height 5\' 8"  (1.727 m), weight 180.985 kg (399 lb), last menstrual period 08/24/2013, SpO2 97 %, currently breast and bottle feeding.  LABS:  Recent Labs  05/23/14 0544  WBC 8.8  HGB 10.6*  PLT 198    Newborn Data Live born female  Birth Weight: 6 lb 14.8 oz (3141 g) APGAR: 9, 9  See operative report for further details  Home with mother.  Discharge Instructions:  Wound Care: keep clean and dry / staple removal with one of the office RNs on Tuesday 05/29/2014 Postpartum Instructions: [facility] discharge booklet - instructions reviewed Medications:    Medication List    TAKE these medications        acetaminophen 500 MG tablet  Commonly known as:  TYLENOL  Take 1,000 mg by mouth every 6 (six) hours as needed for mild pain or headache.     buPROPion 150 MG 24 hr tablet  Commonly known as:  WELLBUTRIN XL  Take 300 mg by mouth daily.     busPIRone 5 MG tablet  Commonly known as:  BUSPAR  Take 5 mg by mouth 2 (two) times daily as needed (anxiety).     calcium carbonate 500 MG chewable tablet  Commonly known as:  TUMS - dosed in mg elemental calcium  Chew 2 tablets by mouth daily as needed for heartburn.     CHOLINE PO  Take 1 tablet by mouth daily.  cyclobenzaprine 5 MG tablet  Commonly known as:  FLEXERIL  Take 5 mg by mouth daily as needed for muscle spasms.     DHA PO  Take 2 capsules by mouth daily.     ibuprofen 600 MG tablet  Commonly known as:  ADVIL,MOTRIN  Take 1 tablet (600 mg total) by mouth every 6 (six) hours.     labetalol 100 MG tablet  Commonly known as:  NORMODYNE  Take 100 mg by mouth 3 (three) times daily.     oxyCODONE-acetaminophen 5-325 MG per tablet  Commonly known as:  PERCOCET/ROXICET  Take 1 tablet by mouth every 4 (four) hours as needed (for pain scale less than 7).     prenatal multivitamin Tabs tablet  Take 1 tablet by mouth daily at 12 noon.     QUEtiapine 400 MG tablet  Commonly known as:  SEROQUEL   Take 400 mg by mouth at bedtime.     ranitidine 150 MG tablet  Commonly known as:  ZANTAC  Take 150 mg by mouth 3 (three) times daily.           Follow-up Information    Follow up with Lenoard AdenAAVON,RICHARD J, MD. Go in 1 week.   Specialty:  Obstetrics and Gynecology   Why:  blood pressure recheck, For staple removal   Contact information:   [address] [place] [address] [phone]       Follow up with Lenoard AdenAAVON,RICHARD J, MD. Schedule an appointment as soon as possible for a visit in 6 weeks.   Specialty:  Obstetrics and Gynecology   Why:  postpartum visit   Contact information:   [address] [place] [address] [phone]         Signed: Kenard GowerAWSON, Karolynn Infantino, M, MSN, CNM 05/25/2014, 9:24 AM

## 2015-04-30 DIAGNOSIS — K829 Disease of gallbladder, unspecified: Secondary | ICD-10-CM

## 2015-04-30 HISTORY — DX: Disease of gallbladder, unspecified: K82.9

## 2015-08-28 LAB — OB RESULTS CONSOLE ANTIBODY SCREEN: ANTIBODY SCREEN: NEGATIVE

## 2015-08-28 LAB — OB RESULTS CONSOLE GC/CHLAMYDIA
Chlamydia: NEGATIVE
Gonorrhea: NEGATIVE

## 2015-08-28 LAB — OB RESULTS CONSOLE ABO/RH: RH Type: POSITIVE

## 2015-08-28 LAB — OB RESULTS CONSOLE RUBELLA ANTIBODY, IGM: Rubella: IMMUNE

## 2015-08-28 LAB — OB RESULTS CONSOLE RPR: RPR: NONREACTIVE

## 2015-08-28 LAB — OB RESULTS CONSOLE HIV ANTIBODY (ROUTINE TESTING): HIV: NONREACTIVE

## 2015-08-28 LAB — OB RESULTS CONSOLE HEPATITIS B SURFACE ANTIGEN: HEP B S AG: NEGATIVE

## 2015-10-21 DIAGNOSIS — Z3A16 16 weeks gestation of pregnancy: Secondary | ICD-10-CM | POA: Diagnosis not present

## 2015-10-21 DIAGNOSIS — O10011 Pre-existing essential hypertension complicating pregnancy, first trimester: Secondary | ICD-10-CM | POA: Diagnosis not present

## 2015-10-21 DIAGNOSIS — Z36 Encounter for antenatal screening of mother: Secondary | ICD-10-CM | POA: Diagnosis not present

## 2015-11-05 DIAGNOSIS — O10011 Pre-existing essential hypertension complicating pregnancy, first trimester: Secondary | ICD-10-CM | POA: Diagnosis not present

## 2015-11-05 DIAGNOSIS — Z3A18 18 weeks gestation of pregnancy: Secondary | ICD-10-CM | POA: Diagnosis not present

## 2015-11-13 DIAGNOSIS — J0100 Acute maxillary sinusitis, unspecified: Secondary | ICD-10-CM | POA: Diagnosis not present

## 2015-11-20 DIAGNOSIS — Z3A21 21 weeks gestation of pregnancy: Secondary | ICD-10-CM | POA: Diagnosis not present

## 2015-11-20 DIAGNOSIS — O10012 Pre-existing essential hypertension complicating pregnancy, second trimester: Secondary | ICD-10-CM | POA: Diagnosis not present

## 2015-12-04 DIAGNOSIS — O09522 Supervision of elderly multigravida, second trimester: Secondary | ICD-10-CM | POA: Diagnosis not present

## 2015-12-04 DIAGNOSIS — Z3A23 23 weeks gestation of pregnancy: Secondary | ICD-10-CM | POA: Diagnosis not present

## 2015-12-04 DIAGNOSIS — O10012 Pre-existing essential hypertension complicating pregnancy, second trimester: Secondary | ICD-10-CM | POA: Diagnosis not present

## 2015-12-10 DIAGNOSIS — D1620 Benign neoplasm of long bones of unspecified lower limb: Secondary | ICD-10-CM | POA: Diagnosis not present

## 2015-12-10 DIAGNOSIS — M5412 Radiculopathy, cervical region: Secondary | ICD-10-CM | POA: Diagnosis not present

## 2015-12-10 DIAGNOSIS — Z3A23 23 weeks gestation of pregnancy: Secondary | ICD-10-CM | POA: Diagnosis not present

## 2015-12-18 DIAGNOSIS — Z36 Encounter for antenatal screening of mother: Secondary | ICD-10-CM | POA: Diagnosis not present

## 2015-12-27 ENCOUNTER — Other Ambulatory Visit: Payer: Self-pay | Admitting: Sports Medicine

## 2015-12-27 DIAGNOSIS — M542 Cervicalgia: Secondary | ICD-10-CM

## 2015-12-27 DIAGNOSIS — M5412 Radiculopathy, cervical region: Secondary | ICD-10-CM | POA: Diagnosis not present

## 2016-01-02 DIAGNOSIS — Z3A27 27 weeks gestation of pregnancy: Secondary | ICD-10-CM | POA: Diagnosis not present

## 2016-01-02 DIAGNOSIS — O10012 Pre-existing essential hypertension complicating pregnancy, second trimester: Secondary | ICD-10-CM | POA: Diagnosis not present

## 2016-01-02 DIAGNOSIS — O09522 Supervision of elderly multigravida, second trimester: Secondary | ICD-10-CM | POA: Diagnosis not present

## 2016-01-02 DIAGNOSIS — O365920 Maternal care for other known or suspected poor fetal growth, second trimester, not applicable or unspecified: Secondary | ICD-10-CM | POA: Diagnosis not present

## 2016-01-13 ENCOUNTER — Ambulatory Visit
Admission: RE | Admit: 2016-01-13 | Discharge: 2016-01-13 | Disposition: A | Discharge status: Home / Self Care | Payer: BLUE CROSS/BLUE SHIELD | Source: Ambulatory Visit | Attending: Sports Medicine | Admitting: Sports Medicine

## 2016-01-13 DIAGNOSIS — M542 Cervicalgia: Secondary | ICD-10-CM

## 2016-01-13 DIAGNOSIS — M50222 Other cervical disc displacement at C5-C6 level: Secondary | ICD-10-CM | POA: Diagnosis not present

## 2016-01-15 DIAGNOSIS — Z23 Encounter for immunization: Secondary | ICD-10-CM | POA: Diagnosis not present

## 2016-01-15 DIAGNOSIS — Z36 Encounter for antenatal screening of mother: Secondary | ICD-10-CM | POA: Diagnosis not present

## 2016-01-25 ENCOUNTER — Inpatient Hospital Stay (HOSPITAL_COMMUNITY): Payer: BLUE CROSS/BLUE SHIELD

## 2016-01-25 ENCOUNTER — Encounter: Payer: Self-pay | Admitting: Medical

## 2016-01-25 ENCOUNTER — Inpatient Hospital Stay (HOSPITAL_COMMUNITY)
Admission: AD | Admit: 2016-01-25 | Discharge: 2016-01-25 | Disposition: A | Discharge status: Home / Self Care | Payer: BLUE CROSS/BLUE SHIELD | Source: Ambulatory Visit | Attending: Obstetrics and Gynecology | Admitting: Obstetrics and Gynecology

## 2016-01-25 DIAGNOSIS — R509 Fever, unspecified: Secondary | ICD-10-CM | POA: Diagnosis not present

## 2016-01-25 DIAGNOSIS — O99213 Obesity complicating pregnancy, third trimester: Secondary | ICD-10-CM | POA: Insufficient documentation

## 2016-01-25 DIAGNOSIS — R0602 Shortness of breath: Secondary | ICD-10-CM

## 2016-01-25 DIAGNOSIS — O163 Unspecified maternal hypertension, third trimester: Secondary | ICD-10-CM | POA: Insufficient documentation

## 2016-01-25 DIAGNOSIS — R519 Headache, unspecified: Secondary | ICD-10-CM

## 2016-01-25 DIAGNOSIS — O26893 Other specified pregnancy related conditions, third trimester: Secondary | ICD-10-CM | POA: Diagnosis not present

## 2016-01-25 DIAGNOSIS — R51 Headache: Secondary | ICD-10-CM | POA: Insufficient documentation

## 2016-01-25 DIAGNOSIS — F319 Bipolar disorder, unspecified: Secondary | ICD-10-CM | POA: Diagnosis not present

## 2016-01-25 DIAGNOSIS — Z3A30 30 weeks gestation of pregnancy: Secondary | ICD-10-CM | POA: Diagnosis not present

## 2016-01-25 DIAGNOSIS — O99343 Other mental disorders complicating pregnancy, third trimester: Secondary | ICD-10-CM | POA: Insufficient documentation

## 2016-01-25 DIAGNOSIS — O133 Gestational [pregnancy-induced] hypertension without significant proteinuria, third trimester: Secondary | ICD-10-CM

## 2016-01-25 LAB — CBC WITH DIFFERENTIAL/PLATELET
BASOS ABS: 0 10*3/uL (ref 0.0–0.1)
BASOS PCT: 0 %
EOS ABS: 0 10*3/uL (ref 0.0–0.7)
Eosinophils Relative: 0 %
HCT: 32.9 % — ABNORMAL LOW (ref 36.0–46.0)
HEMOGLOBIN: 11 g/dL — AB (ref 12.0–15.0)
Lymphocytes Relative: 17 %
Lymphs Abs: 1.4 10*3/uL (ref 0.7–4.0)
MCH: 28.8 pg (ref 26.0–34.0)
MCHC: 33.4 g/dL (ref 30.0–36.0)
MCV: 86.1 fL (ref 78.0–100.0)
Monocytes Absolute: 0.3 10*3/uL (ref 0.1–1.0)
Monocytes Relative: 4 %
NEUTROS PCT: 79 %
Neutro Abs: 6.3 10*3/uL (ref 1.7–7.7)
Platelets: 230 10*3/uL (ref 150–400)
RBC: 3.82 MIL/uL — AB (ref 3.87–5.11)
RDW: 14.3 % (ref 11.5–15.5)
WBC: 8 10*3/uL (ref 4.0–10.5)

## 2016-01-25 LAB — URINALYSIS, ROUTINE W REFLEX MICROSCOPIC
BILIRUBIN URINE: NEGATIVE
Glucose, UA: NEGATIVE mg/dL
Hgb urine dipstick: NEGATIVE
KETONES UR: 15 mg/dL — AB
LEUKOCYTES UA: NEGATIVE
NITRITE: NEGATIVE
Protein, ur: NEGATIVE mg/dL
Specific Gravity, Urine: 1.02 (ref 1.005–1.030)
pH: 8 (ref 5.0–8.0)

## 2016-01-25 LAB — COMPREHENSIVE METABOLIC PANEL
ALK PHOS: 58 U/L (ref 38–126)
ALT: 19 U/L (ref 14–54)
ANION GAP: 5 (ref 5–15)
AST: 16 U/L (ref 15–41)
Albumin: 3 g/dL — ABNORMAL LOW (ref 3.5–5.0)
BUN: 8 mg/dL (ref 6–20)
CO2: 24 mmol/L (ref 22–32)
CREATININE: 0.72 mg/dL (ref 0.44–1.00)
Calcium: 8.7 mg/dL — ABNORMAL LOW (ref 8.9–10.3)
Chloride: 108 mmol/L (ref 101–111)
GFR calc Af Amer: >60 mL/min (ref >60)
GFR calc non Af Amer: >60 mL/min (ref >60)
GLUCOSE: 116 mg/dL — AB (ref 65–99)
Potassium: 4 mmol/L (ref 3.5–5.1)
SODIUM: 137 mmol/L (ref 135–145)
Total Bilirubin: 0.3 mg/dL (ref 0.3–1.2)
Total Protein: 5.7 g/dL — ABNORMAL LOW (ref 6.5–8.1)

## 2016-01-25 MED ORDER — ACETAMINOPHEN 500 MG PO TABS
1000.0000 mg | ORAL_TABLET | Freq: Once | ORAL | Status: AC
  Administered 2016-01-25: 1000 mg via ORAL
  Filled 2016-01-25: qty 2

## 2016-01-25 NOTE — MAU Note (Signed)
Headache started at 1500 and took Tylenol 1GM . Helped alittle. About 1600 started shaking. At 2030 T 102.3. Denies n/v/d. Appetite good. Low lying placenta. No bleeding or d/c.

## 2016-01-25 NOTE — MAU Provider Note (Signed)
History     CSN: [id number]  Arrival date and time: 01/25/16 2116   First Provider Initiated Contact with Patient 01/25/16 2200      Chief Complaint  Patient presents with  . Fever  . Headache   HPI Ms. ABRYANNA MUSOLINO is a 40 y.o. [id number] at [redacted]w[redacted]d who presents to MAU today with complaint of headache and fever. The patient states headache started around 1500 today. She took Tylenol 1 G with minimal relief. She states around 1600 she started to have chills. She took temperature at home at 2030 it was 102.54F. She has not taken any additional Tylenol since then. She denies URI symptoms, flu-like symptoms, UTI symptoms or GI symptoms today. She denies abdominal pain, vaginal bleeding, LOF or contractions. She has CHTN and is moderately well-controlled at this time. She denies sick contacts. She endorses mild SOB which is a chronic issue for her, she does not feel it is any worse today than normal. She is morbidly obese. She denies other complications with the pregnancy.   OB History    Gravida Para Term Preterm AB Living   10 2 2  0 7 2   SAB TAB Ectopic Multiple Live Births   6 1 0 0 2      Past Medical History:  Diagnosis Date  . Anxiety   . Bipolar disorder (HCC)   . Depression   . GERD (gastroesophageal reflux disease)   . Hypertension   . Missed abortions    X 6  . Obesity 04/29/2012  . Termination of pregnancy (fetus) 03/22/2004   X 1    Past Surgical History:  Procedure Laterality Date  . CESAREAN SECTION  2011   X 1 [facility] - [place]  . CESAREAN SECTION N/A 05/22/2014   Procedure: Repeat CESAREAN SECTION;  Surgeon: Lenoard Aden, MD;  Location: [facility];  Service: Obstetrics;  Laterality: N/A;  EDD: 05/31/14  . DILATION AND CURETTAGE OF UTERUS   2005, 2010     x2 MAB  . WISDOM TOOTH EXTRACTION      Family History  Problem Relation Age of Onset  . Depression Mother   . Skin cancer Mother   . Depression Paternal Grandmother   . Depression Cousin   . Pancreatic  cancer Father   . Skin cancer Sister     Social History  Substance Use Topics  . Smoking status: Former Smoker    Packs/day: 2.00    Years: 18.00    Types: Cigarettes    Quit date: 04/29/2009  . Smokeless tobacco: Never Used  . Alcohol use No    Allergies:  Allergies  Allergen Reactions  . Adhesive [Tape] Rash and Other (See Comments)    Prescriptions Prior to Admission  Medication Sig Dispense Refill Last Dose  . acetaminophen (TYLENOL) 500 MG tablet Take 1,000 mg by mouth every 6 (six) hours as needed for mild pain or headache.     [name] buPROPion (WELLBUTRIN XL) 150 MG 24 hr tablet Take 300 mg by mouth daily.    05/22/2014 at 0700  . busPIRone (BUSPAR) 5 MG tablet Take 5 mg by mouth 2 (two) times daily as needed (anxiety).   0   . calcium carbonate (TUMS - DOSED IN MG ELEMENTAL CALCIUM) 500 MG chewable tablet Chew 2 tablets by mouth daily as needed for heartburn.   Taking  . CHOLINE PO Take 1 tablet by mouth daily.     . cyclobenzaprine (FLEXERIL) 5 MG tablet Take 5 mg by  mouth daily as needed for muscle spasms.   0   . Docosahexaenoic Acid (DHA PO) Take 2 capsules by mouth daily.     [name] ibuprofen (ADVIL,MOTRIN) 600 MG tablet Take 1 tablet (600 mg total) by mouth every 6 (six) hours. 30 tablet 0   . labetalol (NORMODYNE) 100 MG tablet Take 100 mg by mouth 3 (three) times daily.   1 0700  . oxyCODONE-acetaminophen (PERCOCET/ROXICET) 5-325 MG per tablet Take 1 tablet by mouth every 4 (four) hours as needed (for pain scale less than 7). 30 tablet 0   . Prenatal Vit-Fe Fumarate-FA (PRENATAL MULTIVITAMIN) TABS tablet Take 1 tablet by mouth daily at 12 noon.     . QUEtiapine (SEROQUEL) 400 MG tablet Take 400 mg by mouth at bedtime.   10   . ranitidine (ZANTAC) 150 MG tablet Take 150 mg by mouth 3 (three) times daily.   0700    Review of Systems  Constitutional: Positive for chills and fever. Negative for malaise/fatigue.  HENT: Negative for congestion and sore throat.   Eyes: Negative  for blurred vision.  Respiratory: Positive for shortness of breath. Negative for cough, sputum production and wheezing.   Cardiovascular: Negative for chest pain.  Gastrointestinal: Negative for abdominal pain, constipation, diarrhea, nausea and vomiting.  Genitourinary: Negative for dysuria, flank pain, frequency, hematuria and urgency.       Neg - vaginal bleeding, discharge, LOF  Musculoskeletal: Negative for myalgias.  Neurological: Positive for headaches. Negative for dizziness, loss of consciousness and weakness.   Physical Exam   Blood pressure 156/81, pulse 93, temperature 100.3 F (37.9 C), resp. rate 18, height 5\' 8"  (1.727 m), weight (!) 378 lb (171.5 kg), SpO2 98 %, unknown if currently breastfeeding.  Physical Exam  Nursing note and vitals reviewed. Constitutional: She is oriented to person, place, and time. She appears well-developed and well-nourished. No distress.  HENT:  Head: Normocephalic and atraumatic.  Neck: Normal range of motion.  Cardiovascular: Regular rhythm and normal heart sounds.  Tachycardia present.   Respiratory: Effort normal and breath sounds normal. No respiratory distress. She has no wheezes.  GI: Soft. Bowel sounds are normal. She exhibits no distension and no mass. There is no tenderness. There is no rebound and no guarding.  Neurological: She is alert and oriented to person, place, and time.  Skin: Skin is warm and dry. No rash noted. No erythema.  Psychiatric: She has a normal mood and affect.    Results for orders placed or performed during the hospital encounter of 01/25/16 (from the past 24 hour(s))  Urinalysis, Routine w reflex microscopic (not at [facility])     Status: Abnormal   Collection Time: 01/25/16  9:40 PM  Result Value Ref Range   Color, Urine YELLOW YELLOW   APPearance CLEAR CLEAR   Specific Gravity, Urine 1.020 1.005 - 1.030   pH 8.0 5.0 - 8.0   Glucose, UA NEGATIVE NEGATIVE mg/dL   Hgb urine dipstick NEGATIVE NEGATIVE    Bilirubin Urine NEGATIVE NEGATIVE   Ketones, ur 15 (A) NEGATIVE mg/dL   Protein, ur NEGATIVE NEGATIVE mg/dL   Nitrite NEGATIVE NEGATIVE   Leukocytes, UA NEGATIVE NEGATIVE  CBC with Differential/Platelet     Status: Abnormal   Collection Time: 01/25/16 10:33 PM  Result Value Ref Range   WBC 8.0 4.0 - 10.5 K/uL   RBC 3.82 (L) 3.87 - 5.11 MIL/uL   Hemoglobin 11.0 (L) 12.0 - 15.0 g/dL   HCT 63.8 (L) 46.6 - 59.9 %  MCV 86.1 78.0 - 100.0 fL   MCH 28.8 26.0 - 34.0 pg   MCHC 33.4 30.0 - 36.0 g/dL   RDW 47.8 29.5 - 62.1 %   Platelets 230 150 - 400 K/uL   Neutrophils Relative % 79 %   Neutro Abs 6.3 1.7 - 7.7 K/uL   Lymphocytes Relative 17 %   Lymphs Abs 1.4 0.7 - 4.0 K/uL   Monocytes Relative 4 %   Monocytes Absolute 0.3 0.1 - 1.0 K/uL   Eosinophils Relative 0 %   Eosinophils Absolute 0.0 0.0 - 0.7 K/uL   Basophils Relative 0 %   Basophils Absolute 0.0 0.0 - 0.1 K/uL  Comprehensive metabolic panel     Status: Abnormal   Collection Time: 01/25/16 10:33 PM  Result Value Ref Range   Sodium 137 135 - 145 mmol/L   Potassium 4.0 3.5 - 5.1 mmol/L   Chloride 108 101 - 111 mmol/L   CO2 24 22 - 32 mmol/L   Glucose, Bld 116 (H) 65 - 99 mg/dL   BUN 8 6 - 20 mg/dL   Creatinine, Ser 3.08 0.44 - 1.00 mg/dL   Calcium 8.7 (L) 8.9 - 10.3 mg/dL   Total Protein 5.7 (L) 6.5 - 8.1 g/dL   Albumin 3.0 (L) 3.5 - 5.0 g/dL   AST 16 15 - 41 U/L   ALT 19 14 - 54 U/L   Alkaline Phosphatase 58 38 - 126 U/L   Total Bilirubin 0.3 0.3 - 1.2 mg/dL   GFR calc non Af Amer >60 >60 mL/min   GFR calc Af Amer >60 >60 mL/min   Anion gap 5 5 - 15   Dg Chest 2 View  Result Date: 01/25/2016 CLINICAL DATA:  40 year old female with fever and shortness of breath EXAM: CHEST  2 VIEW COMPARISON:  Chest radiograph dated 11/16/2009 FINDINGS: The heart size and mediastinal contours are within normal limits. Both lungs are clear. The visualized skeletal structures are unremarkable. IMPRESSION: No active cardiopulmonary disease.  Electronically Signed   By: Elgie Collard M.D.   On: 01/25/2016 23:14   Fetal Monitoring: Baseline: 160 bpm Variability: moderate Accelerations: 15 x 15 Decelerations: none Contractions: none  MAU Course  Procedures None  MDM Discussed patient with Dr. Billy Coast, he does not feel that code sepsis protocol applies since patient appears physically well and is essentially asymptomatic of reason for fever. He feels this is most likely viral in etiology.  Recommends UA, CBC, CMP, CXR Tylenol 1 G for fever Discussed patient results with Dr. Billy Coast. He agrees with plan for discharge with management of possible viral illness at this point. Will await influenza results before given Tamiflu if needed. Advise patient to continued Tylenol around the clock and call if fever persists or symptoms arise.  Patient is afebrile at 100.3 F at time of discharge.   Assessment and Plan  A: SIUP at [redacted]w[redacted]d Fever Headache CHTN in pregnancy, third trimester   P: Discharge home Influenza results pending at time of discharge, will call with abnormal results Warning signs for worsening condition discussed Advised increased PO hydration and rest Patient advised to follow-up with Dr. Billy Coast as scheduled this week for routine OB care or sooner if symptoms arise or fever persists Patient may return to MAU as needed or if her condition were to change or worsen   Marny Lowenstein, PA-C  01/25/2016, 11:47 PM

## 2016-01-25 NOTE — Discharge Instructions (Signed)
Hypertension During Pregnancy °Hypertension is also called high blood pressure. Blood pressure moves blood in your body. Sometimes, the force that moves the blood becomes too strong. When you are pregnant, this condition should be watched carefully. It can cause problems for you and your baby. °HOME CARE  °· Make and keep all of your doctor visits. °· Take medicine as told by your doctor. Tell your doctor about all medicines you take. °· Eat very little salt. °· Exercise regularly. °· Do not drink alcohol. °· Do not smoke. °· Do not have drinks with caffeine. °· Lie on your left side when resting. °· Your health care provider may ask you to take one low-dose aspirin (81mg) each day. °GET HELP RIGHT AWAY IF: °· You have bad belly (abdominal) pain. °· You have sudden puffiness (swelling) in the hands, ankles, or face. °· You gain 4 pounds (1.8 kilograms) or more in 1 week. °· You throw up (vomit) repeatedly. °· You have bleeding from the vagina. °· You do not feel the baby moving as much. °· You have a headache. °· You have blurred or double vision. °· You have muscle twitching or spasms. °· You have shortness of breath. °· You have blue fingernails and lips. °· You have blood in your pee (urine). °MAKE SURE YOU: °· Understand these instructions. °· Will watch your condition. °· Will get help right away if you are not doing well or get worse. °  °This information is not intended to replace advice given to you by your health care provider. Make sure you discuss any questions you have with your health care provider. °  °Document Released: 07/18/2010 Document Revised: 07/06/2014 Document Reviewed: 01/12/2013 °Elsevier Interactive Patient Education ©2016 [employer] ° °

## 2016-01-26 LAB — INFLUENZA PANEL BY PCR (TYPE A & B)
H1N1 flu by pcr: NOT DETECTED
INFLAPCR: NEGATIVE
Influenza B By PCR: NEGATIVE

## 2016-01-28 DIAGNOSIS — O10013 Pre-existing essential hypertension complicating pregnancy, third trimester: Secondary | ICD-10-CM | POA: Diagnosis not present

## 2016-01-28 DIAGNOSIS — Z3A30 30 weeks gestation of pregnancy: Secondary | ICD-10-CM | POA: Diagnosis not present

## 2016-02-14 DIAGNOSIS — M5412 Radiculopathy, cervical region: Secondary | ICD-10-CM | POA: Diagnosis not present

## 2016-02-17 DIAGNOSIS — R339 Retention of urine, unspecified: Secondary | ICD-10-CM | POA: Diagnosis not present

## 2016-02-17 DIAGNOSIS — Z3A33 33 weeks gestation of pregnancy: Secondary | ICD-10-CM | POA: Diagnosis not present

## 2016-02-17 DIAGNOSIS — O10013 Pre-existing essential hypertension complicating pregnancy, third trimester: Secondary | ICD-10-CM | POA: Diagnosis not present

## 2016-02-24 ENCOUNTER — Other Ambulatory Visit: Payer: Self-pay | Admitting: Obstetrics and Gynecology

## 2016-02-25 DIAGNOSIS — Z3A34 34 weeks gestation of pregnancy: Secondary | ICD-10-CM | POA: Diagnosis not present

## 2016-02-25 DIAGNOSIS — O10013 Pre-existing essential hypertension complicating pregnancy, third trimester: Secondary | ICD-10-CM | POA: Diagnosis not present

## 2016-03-05 DIAGNOSIS — Z3A36 36 weeks gestation of pregnancy: Secondary | ICD-10-CM | POA: Diagnosis not present

## 2016-03-05 DIAGNOSIS — O163 Unspecified maternal hypertension, third trimester: Secondary | ICD-10-CM | POA: Diagnosis not present

## 2016-03-05 DIAGNOSIS — Z36 Encounter for antenatal screening of mother: Secondary | ICD-10-CM | POA: Diagnosis not present

## 2016-03-05 DIAGNOSIS — O10013 Pre-existing essential hypertension complicating pregnancy, third trimester: Secondary | ICD-10-CM | POA: Diagnosis not present

## 2016-03-05 LAB — OB RESULTS CONSOLE GBS: GBS: POSITIVE

## 2016-03-11 DIAGNOSIS — O10013 Pre-existing essential hypertension complicating pregnancy, third trimester: Secondary | ICD-10-CM | POA: Diagnosis not present

## 2016-03-11 DIAGNOSIS — Z3A37 37 weeks gestation of pregnancy: Secondary | ICD-10-CM | POA: Diagnosis not present

## 2016-03-17 DIAGNOSIS — Z23 Encounter for immunization: Secondary | ICD-10-CM | POA: Diagnosis not present

## 2016-03-17 DIAGNOSIS — O10013 Pre-existing essential hypertension complicating pregnancy, third trimester: Secondary | ICD-10-CM | POA: Diagnosis not present

## 2016-03-17 DIAGNOSIS — Z3A37 37 weeks gestation of pregnancy: Secondary | ICD-10-CM | POA: Diagnosis not present

## 2016-03-20 ENCOUNTER — Telehealth (HOSPITAL_COMMUNITY): Payer: Self-pay | Admitting: *Deleted

## 2016-03-20 ENCOUNTER — Encounter (HOSPITAL_COMMUNITY): Payer: Self-pay

## 2016-03-20 NOTE — Treatment Plan (Signed)
I spoke with [facility] Daphine DeutscherMartin [facility] regarding the best possible management of Suzanne Wilcox's care.  He suggested using the largest abdominal binders placed end to end with velcro adhering them together. [facility] so these could be obtained prior to the CS on 9/29.

## 2016-03-20 NOTE — Telephone Encounter (Signed)
[facility] uncertain if she wants to get a PICC line.  She will talk to Dr Billy Coastaavon and let me know what she decides.    Updated pt to plan of CSE for anesthesia management and the appropriate binders have been ordered.

## 2016-03-25 ENCOUNTER — Encounter (HOSPITAL_COMMUNITY)
Admission: RE | Admit: 2016-03-25 | Discharge: 2016-03-25 | Disposition: A | Discharge status: Home / Self Care | Payer: BLUE CROSS/BLUE SHIELD | Source: Ambulatory Visit | Attending: Obstetrics and Gynecology | Admitting: Obstetrics and Gynecology

## 2016-03-25 NOTE — Patient Instructions (Signed)
20 Aireona A Gozick-Didion  03/25/2016   Your procedure is scheduled on:  03/27/2016  [facility] at 0945 AM.  Pick up the phone at the desk and dial [phone].   Call this number if you have problems the morning of surgery: [phone]   Remember:   Do not eat food:After Midnight.  Do not drink clear liquids: After Midnight.  Take these medicines the morning of surgery with A SIP OF WATER: wellbutrin, neurotin, choline, seroquel, zantac   Do not wear jewelry, make-up or nail polish.  Do not wear lotions, powders, or perfumes. Do not wear deodorant.  Do not shave 48 hours prior to surgery.  Do not bring valuables to the hospital.  [facility] is not   responsible for any belongings or valuables brought to the hospital.  Contacts, dentures or bridgework may not be worn into surgery.  Leave suitcase in the car. After surgery it may be brought to your room.  For patients admitted to the hospital, checkout time is 11:00 AM the day of              discharge.   Patients discharged the day of surgery will not be allowed to drive             home.  Name and phone number of your driver: na  Special Instructions:   N/A   Please read over the following fact sheets that you were given:   Surgical Site Infection Prevention

## 2016-03-26 ENCOUNTER — Encounter (HOSPITAL_COMMUNITY): Payer: Self-pay | Admitting: Anesthesiology

## 2016-03-26 ENCOUNTER — Other Ambulatory Visit (HOSPITAL_COMMUNITY): Payer: Self-pay | Admitting: Anesthesiology

## 2016-03-26 ENCOUNTER — Encounter (HOSPITAL_COMMUNITY): Payer: Self-pay | Admitting: Interventional Radiology

## 2016-03-26 ENCOUNTER — Encounter (HOSPITAL_COMMUNITY)
Admission: RE | Admit: 2016-03-26 | Discharge: 2016-03-26 | Disposition: A | Discharge status: Home / Self Care | Payer: BLUE CROSS/BLUE SHIELD | Source: Ambulatory Visit

## 2016-03-26 ENCOUNTER — Ambulatory Visit (HOSPITAL_COMMUNITY)
Admission: RE | Admit: 2016-03-26 | Discharge: 2016-03-26 | Disposition: A | Discharge status: Home / Self Care | Payer: BLUE CROSS/BLUE SHIELD | Source: Ambulatory Visit | Attending: Anesthesiology | Admitting: Anesthesiology

## 2016-03-26 DIAGNOSIS — O1205 Gestational edema, complicating the puerperium: Secondary | ICD-10-CM | POA: Diagnosis not present

## 2016-03-26 DIAGNOSIS — Z87891 Personal history of nicotine dependence: Secondary | ICD-10-CM | POA: Diagnosis not present

## 2016-03-26 DIAGNOSIS — O2293 Venous complication in pregnancy, unspecified, third trimester: Secondary | ICD-10-CM

## 2016-03-26 DIAGNOSIS — Z3A39 39 weeks gestation of pregnancy: Secondary | ICD-10-CM | POA: Diagnosis not present

## 2016-03-26 DIAGNOSIS — N9989 Other postprocedural complications and disorders of genitourinary system: Secondary | ICD-10-CM | POA: Diagnosis not present

## 2016-03-26 DIAGNOSIS — O34211 Maternal care for low transverse scar from previous cesarean delivery: Secondary | ICD-10-CM | POA: Diagnosis not present

## 2016-03-26 DIAGNOSIS — R338 Other retention of urine: Secondary | ICD-10-CM | POA: Diagnosis not present

## 2016-03-26 DIAGNOSIS — O99824 Streptococcus B carrier state complicating childbirth: Secondary | ICD-10-CM | POA: Diagnosis not present

## 2016-03-26 DIAGNOSIS — Z452 Encounter for adjustment and management of vascular access device: Secondary | ICD-10-CM | POA: Diagnosis not present

## 2016-03-26 DIAGNOSIS — O10013 Pre-existing essential hypertension complicating pregnancy, third trimester: Secondary | ICD-10-CM | POA: Diagnosis not present

## 2016-03-26 DIAGNOSIS — O99344 Other mental disorders complicating childbirth: Secondary | ICD-10-CM | POA: Diagnosis not present

## 2016-03-26 DIAGNOSIS — F329 Major depressive disorder, single episode, unspecified: Secondary | ICD-10-CM | POA: Diagnosis not present

## 2016-03-26 DIAGNOSIS — O34219 Maternal care for unspecified type scar from previous cesarean delivery: Secondary | ICD-10-CM | POA: Diagnosis not present

## 2016-03-26 DIAGNOSIS — O1002 Pre-existing essential hypertension complicating childbirth: Secondary | ICD-10-CM | POA: Diagnosis not present

## 2016-03-26 DIAGNOSIS — Z302 Encounter for sterilization: Secondary | ICD-10-CM | POA: Diagnosis not present

## 2016-03-26 DIAGNOSIS — Z823 Family history of stroke: Secondary | ICD-10-CM | POA: Diagnosis not present

## 2016-03-26 DIAGNOSIS — Z6841 Body Mass Index (BMI) 40.0 and over, adult: Secondary | ICD-10-CM | POA: Diagnosis not present

## 2016-03-26 DIAGNOSIS — O99214 Obesity complicating childbirth: Secondary | ICD-10-CM | POA: Diagnosis not present

## 2016-03-26 HISTORY — DX: Unspecified abnormal cytological findings in specimens from vagina: R87.629

## 2016-03-26 HISTORY — PX: IR GENERIC HISTORICAL: IMG1180011

## 2016-03-26 HISTORY — DX: Personal history of other infectious and parasitic diseases: Z86.19

## 2016-03-26 MED ORDER — HEPARIN SOD (PORK) LOCK FLUSH 100 UNIT/ML IV SOLN
INTRAVENOUS | Status: AC
  Filled 2016-03-26: qty 5

## 2016-03-26 MED ORDER — LIDOCAINE HCL 1 % IJ SOLN
INTRAMUSCULAR | Status: DC | PRN
  Administered 2016-03-26: 5 mL

## 2016-03-26 MED ORDER — DEXTROSE 5 % IV SOLN
3.0000 g | INTRAVENOUS | Status: AC
  Administered 2016-03-27: 3 g via INTRAVENOUS
  Filled 2016-03-26: qty 3000

## 2016-03-26 MED ORDER — LIDOCAINE HCL 1 % IJ SOLN
INTRAMUSCULAR | Status: AC
  Filled 2016-03-26: qty 20

## 2016-03-26 NOTE — Procedures (Signed)
RUE PICC 48 cm SVC RA No comp/EBL

## 2016-03-27 ENCOUNTER — Encounter (HOSPITAL_COMMUNITY)
Admission: RE | Disposition: A | Discharge status: Home / Self Care | Payer: Self-pay | Source: Ambulatory Visit | Attending: Obstetrics and Gynecology

## 2016-03-27 ENCOUNTER — Inpatient Hospital Stay (HOSPITAL_COMMUNITY): Payer: BLUE CROSS/BLUE SHIELD | Admitting: Anesthesiology

## 2016-03-27 ENCOUNTER — Encounter (HOSPITAL_COMMUNITY): Payer: Self-pay

## 2016-03-27 ENCOUNTER — Inpatient Hospital Stay (HOSPITAL_COMMUNITY)
Admission: RE | Admit: 2016-03-27 | Discharge: 2016-03-30 | DRG: 765 | Disposition: A | Discharge status: Home / Self Care | Payer: BLUE CROSS/BLUE SHIELD | Source: Ambulatory Visit | Attending: Obstetrics and Gynecology | Admitting: Obstetrics and Gynecology

## 2016-03-27 DIAGNOSIS — N9989 Other postprocedural complications and disorders of genitourinary system: Secondary | ICD-10-CM | POA: Diagnosis not present

## 2016-03-27 DIAGNOSIS — O1002 Pre-existing essential hypertension complicating childbirth: Secondary | ICD-10-CM | POA: Diagnosis not present

## 2016-03-27 DIAGNOSIS — Z87891 Personal history of nicotine dependence: Secondary | ICD-10-CM

## 2016-03-27 DIAGNOSIS — Z823 Family history of stroke: Secondary | ICD-10-CM | POA: Diagnosis not present

## 2016-03-27 DIAGNOSIS — Z302 Encounter for sterilization: Secondary | ICD-10-CM | POA: Diagnosis not present

## 2016-03-27 DIAGNOSIS — O99824 Streptococcus B carrier state complicating childbirth: Secondary | ICD-10-CM | POA: Diagnosis present

## 2016-03-27 DIAGNOSIS — F329 Major depressive disorder, single episode, unspecified: Secondary | ICD-10-CM | POA: Diagnosis present

## 2016-03-27 DIAGNOSIS — Z6841 Body Mass Index (BMI) 40.0 and over, adult: Secondary | ICD-10-CM

## 2016-03-27 DIAGNOSIS — O34211 Maternal care for low transverse scar from previous cesarean delivery: Secondary | ICD-10-CM | POA: Diagnosis not present

## 2016-03-27 DIAGNOSIS — R338 Other retention of urine: Secondary | ICD-10-CM

## 2016-03-27 DIAGNOSIS — Z3A39 39 weeks gestation of pregnancy: Secondary | ICD-10-CM | POA: Diagnosis not present

## 2016-03-27 DIAGNOSIS — O99214 Obesity complicating childbirth: Secondary | ICD-10-CM | POA: Diagnosis present

## 2016-03-27 DIAGNOSIS — O1205 Gestational edema, complicating the puerperium: Secondary | ICD-10-CM | POA: Diagnosis not present

## 2016-03-27 DIAGNOSIS — O99344 Other mental disorders complicating childbirth: Secondary | ICD-10-CM | POA: Diagnosis present

## 2016-03-27 DIAGNOSIS — O34219 Maternal care for unspecified type scar from previous cesarean delivery: Secondary | ICD-10-CM | POA: Diagnosis not present

## 2016-03-27 HISTORY — DX: Other retention of urine: R33.8

## 2016-03-27 HISTORY — DX: Other postprocedural complications and disorders of genitourinary system: N99.89

## 2016-03-27 LAB — CBC
HCT: 35.8 % — ABNORMAL LOW (ref 36.0–46.0)
Hemoglobin: 12.2 g/dL (ref 12.0–15.0)
MCH: 29.3 pg (ref 26.0–34.0)
MCHC: 34.1 g/dL (ref 30.0–36.0)
MCV: 85.9 fL (ref 78.0–100.0)
Platelets: 238 10*3/uL (ref 150–400)
RBC: 4.17 MIL/uL (ref 3.87–5.11)
RDW: 14 % (ref 11.5–15.5)
WBC: 10.9 10*3/uL — AB (ref 4.0–10.5)

## 2016-03-27 LAB — BASIC METABOLIC PANEL
ANION GAP: 8 (ref 5–15)
BUN: 10 mg/dL (ref 6–20)
CALCIUM: 9 mg/dL (ref 8.9–10.3)
CO2: 22 mmol/L (ref 22–32)
Chloride: 105 mmol/L (ref 101–111)
Creatinine, Ser: 0.62 mg/dL (ref 0.44–1.00)
GFR calc Af Amer: >60 mL/min (ref >60)
GLUCOSE: 85 mg/dL (ref 65–99)
Potassium: 3.8 mmol/L (ref 3.5–5.1)
Sodium: 135 mmol/L (ref 135–145)

## 2016-03-27 LAB — PREPARE RBC (CROSSMATCH)

## 2016-03-27 SURGERY — Surgical Case
Anesthesia: Spinal | Laterality: Bilateral

## 2016-03-27 MED ORDER — ONDANSETRON HCL 4 MG/2ML IJ SOLN: 4.0000 mg | Freq: Three times a day (TID) | INTRAMUSCULAR | Status: DC | PRN

## 2016-03-27 MED ORDER — NALBUPHINE HCL 10 MG/ML IJ SOLN: 5.0000 mg | INTRAMUSCULAR | Status: DC | PRN

## 2016-03-27 MED ORDER — SODIUM CHLORIDE 0.9% FLUSH: 3.0000 mL | INTRAVENOUS | Status: DC | PRN

## 2016-03-27 MED ORDER — OXYTOCIN 40 UNITS IN LACTATED RINGERS INFUSION - SIMPLE MED: 2.5000 [IU]/h | INTRAVENOUS | Status: AC

## 2016-03-27 MED ORDER — FENTANYL CITRATE (PF) 100 MCG/2ML IJ SOLN
INTRAMUSCULAR | Status: DC | PRN
  Administered 2016-03-27: 25 ug via INTRATHECAL

## 2016-03-27 MED ORDER — LACTATED RINGERS IV SOLN
Freq: Once | INTRAVENOUS | Status: AC
  Administered 2016-03-27: 12:00:00 via INTRAVENOUS
  Administered 2016-03-27: 1000 mL/h via INTRAVENOUS

## 2016-03-27 MED ORDER — TETANUS-DIPHTH-ACELL PERTUSSIS 5-2.5-18.5 LF-MCG/0.5 IM SUSP: 0.5000 mL | Freq: Once | INTRAMUSCULAR | Status: DC

## 2016-03-27 MED ORDER — ACETAMINOPHEN 500 MG PO TABS
1000.0000 mg | ORAL_TABLET | Freq: Four times a day (QID) | ORAL | Status: DC
  Administered 2016-03-27: 1000 mg via ORAL
  Filled 2016-03-27: qty 2

## 2016-03-27 MED ORDER — MORPHINE SULFATE-NACL 0.5-0.9 MG/ML-% IV SOSY
PREFILLED_SYRINGE | INTRAVENOUS | Status: DC | PRN
  Administered 2016-03-27: .1 mg via INTRATHECAL

## 2016-03-27 MED ORDER — MEPERIDINE HCL 25 MG/ML IJ SOLN: 6.2500 mg | INTRAMUSCULAR | Status: DC | PRN

## 2016-03-27 MED ORDER — NALBUPHINE HCL 10 MG/ML IJ SOLN
INTRAMUSCULAR | Status: AC
  Filled 2016-03-27: qty 1

## 2016-03-27 MED ORDER — BUPIVACAINE IN DEXTROSE 0.75-8.25 % IT SOLN
INTRATHECAL | Status: DC | PRN
  Administered 2016-03-27: 1.8 mL via INTRATHECAL

## 2016-03-27 MED ORDER — DIPHENHYDRAMINE HCL 50 MG/ML IJ SOLN: 12.5000 mg | INTRAMUSCULAR | Status: DC | PRN

## 2016-03-27 MED ORDER — OXYTOCIN 10 UNIT/ML IJ SOLN
INTRAVENOUS | Status: DC | PRN
  Administered 2016-03-27: 40 [IU] via INTRAVENOUS

## 2016-03-27 MED ORDER — SCOPOLAMINE 1 MG/3DAYS TD PT72: 1.0000 | MEDICATED_PATCH | Freq: Once | TRANSDERMAL | Status: DC

## 2016-03-27 MED ORDER — FENTANYL CITRATE (PF) 100 MCG/2ML IJ SOLN
INTRAMUSCULAR | Status: AC
  Filled 2016-03-27: qty 2

## 2016-03-27 MED ORDER — SIMETHICONE 80 MG PO CHEW
80.0000 mg | CHEWABLE_TABLET | ORAL | Status: DC
  Administered 2016-03-27 – 2016-03-30 (×3): 80 mg via ORAL
  Filled 2016-03-27 (×3): qty 1

## 2016-03-27 MED ORDER — SIMETHICONE 80 MG PO CHEW
80.0000 mg | CHEWABLE_TABLET | Freq: Three times a day (TID) | ORAL | Status: DC
  Administered 2016-03-28 – 2016-03-30 (×6): 80 mg via ORAL
  Filled 2016-03-27 (×7): qty 1

## 2016-03-27 MED ORDER — NALOXONE HCL 2 MG/2ML IJ SOSY
1.0000 ug/kg/h | PREFILLED_SYRINGE | INTRAVENOUS | Status: DC | PRN
  Filled 2016-03-27: qty 2

## 2016-03-27 MED ORDER — OXYTOCIN 10 UNIT/ML IJ SOLN
INTRAMUSCULAR | Status: AC
  Filled 2016-03-27: qty 4

## 2016-03-27 MED ORDER — NALOXONE HCL 0.4 MG/ML IJ SOLN: 0.4000 mg | INTRAMUSCULAR | Status: DC | PRN

## 2016-03-27 MED ORDER — BUPIVACAINE HCL (PF) 0.25 % IJ SOLN
INTRAMUSCULAR | Status: AC
  Filled 2016-03-27: qty 30

## 2016-03-27 MED ORDER — OXYCODONE HCL 5 MG PO TABS
5.0000 mg | ORAL_TABLET | ORAL | Status: DC | PRN
  Administered 2016-03-28 – 2016-03-29 (×7): 5 mg via ORAL
  Filled 2016-03-27 (×6): qty 1

## 2016-03-27 MED ORDER — QUETIAPINE FUMARATE 100 MG PO TABS
200.0000 mg | ORAL_TABLET | Freq: Every day | ORAL | Status: DC
  Administered 2016-03-27 – 2016-03-28 (×2): 200 mg via ORAL
  Filled 2016-03-27 (×4): qty 2

## 2016-03-27 MED ORDER — PRENATAL MULTIVITAMIN CH
1.0000 | ORAL_TABLET | Freq: Every day | ORAL | Status: DC
  Administered 2016-03-28 – 2016-03-30 (×3): 1 via ORAL
  Filled 2016-03-27 (×4): qty 1

## 2016-03-27 MED ORDER — LABETALOL HCL 100 MG PO TABS
100.0000 mg | ORAL_TABLET | Freq: Three times a day (TID) | ORAL | Status: DC
  Administered 2016-03-28 (×3): 100 mg via ORAL
  Filled 2016-03-27 (×5): qty 1

## 2016-03-27 MED ORDER — BUPIVACAINE HCL (PF) 0.25 % IJ SOLN
INTRAMUSCULAR | Status: DC | PRN
  Administered 2016-03-27: 30 mL

## 2016-03-27 MED ORDER — BUPROPION HCL ER (XL) 300 MG PO TB24
300.0000 mg | ORAL_TABLET | Freq: Every day | ORAL | Status: DC
  Administered 2016-03-28 – 2016-03-30 (×3): 300 mg via ORAL
  Filled 2016-03-27 (×4): qty 1

## 2016-03-27 MED ORDER — DEXAMETHASONE SODIUM PHOSPHATE 10 MG/ML IJ SOLN
INTRAMUSCULAR | Status: DC | PRN
  Administered 2016-03-27: 10 mg via INTRAVENOUS

## 2016-03-27 MED ORDER — SIMETHICONE 80 MG PO CHEW: 80.0000 mg | CHEWABLE_TABLET | ORAL | Status: DC | PRN

## 2016-03-27 MED ORDER — MORPHINE SULFATE-NACL 0.5-0.9 MG/ML-% IV SOSY
PREFILLED_SYRINGE | INTRAVENOUS | Status: AC
  Filled 2016-03-27: qty 1

## 2016-03-27 MED ORDER — NALBUPHINE HCL 10 MG/ML IJ SOLN: 5.0000 mg | Freq: Once | INTRAMUSCULAR | Status: AC | PRN

## 2016-03-27 MED ORDER — SCOPOLAMINE 1 MG/3DAYS TD PT72
MEDICATED_PATCH | TRANSDERMAL | Status: AC
  Administered 2016-03-27: 1.5 mg via TRANSDERMAL
  Filled 2016-03-27: qty 1

## 2016-03-27 MED ORDER — GABAPENTIN 100 MG PO CAPS
100.0000 mg | ORAL_CAPSULE | Freq: Two times a day (BID) | ORAL | Status: DC
  Administered 2016-03-27 – 2016-03-30 (×6): 100 mg via ORAL
  Filled 2016-03-27 (×8): qty 1

## 2016-03-27 MED ORDER — PHENYLEPHRINE 8 MG IN D5W 100 ML (0.08MG/ML) PREMIX OPTIME
INJECTION | INTRAVENOUS | Status: AC
  Filled 2016-03-27: qty 100

## 2016-03-27 MED ORDER — SODIUM CHLORIDE 0.9% FLUSH
10.0000 mL | Freq: Two times a day (BID) | INTRAVENOUS | Status: DC
  Administered 2016-03-28 (×2): 10 mL
  Administered 2016-03-30: 20 mL

## 2016-03-27 MED ORDER — ACETAMINOPHEN 325 MG PO TABS
650.0000 mg | ORAL_TABLET | ORAL | Status: DC | PRN
  Administered 2016-03-28 – 2016-03-29 (×5): 650 mg via ORAL
  Filled 2016-03-27 (×5): qty 2

## 2016-03-27 MED ORDER — IBUPROFEN 600 MG PO TABS
600.0000 mg | ORAL_TABLET | Freq: Four times a day (QID) | ORAL | Status: DC | PRN
  Administered 2016-03-27: 600 mg via ORAL
  Filled 2016-03-27: qty 1

## 2016-03-27 MED ORDER — PHENYLEPHRINE 40 MCG/ML (10ML) SYRINGE FOR IV PUSH (FOR BLOOD PRESSURE SUPPORT)
PREFILLED_SYRINGE | INTRAVENOUS | Status: AC
  Filled 2016-03-27: qty 10

## 2016-03-27 MED ORDER — DIPHENHYDRAMINE HCL 25 MG PO CAPS: 25.0000 mg | ORAL_CAPSULE | Freq: Four times a day (QID) | ORAL | Status: DC | PRN

## 2016-03-27 MED ORDER — KETOROLAC TROMETHAMINE 30 MG/ML IJ SOLN
INTRAMUSCULAR | Status: AC
  Administered 2016-03-27: 30 mg
  Filled 2016-03-27: qty 1

## 2016-03-27 MED ORDER — IBUPROFEN 600 MG PO TABS
600.0000 mg | ORAL_TABLET | Freq: Four times a day (QID) | ORAL | Status: DC
  Administered 2016-03-28 – 2016-03-30 (×9): 600 mg via ORAL
  Filled 2016-03-27 (×11): qty 1

## 2016-03-27 MED ORDER — LACTATED RINGERS IV SOLN
INTRAVENOUS | Status: DC
  Administered 2016-03-27: 13:00:00 via INTRAVENOUS

## 2016-03-27 MED ORDER — OXYCODONE HCL 5 MG PO TABS
10.0000 mg | ORAL_TABLET | ORAL | Status: DC | PRN
  Administered 2016-03-29 – 2016-03-30 (×5): 10 mg via ORAL
  Filled 2016-03-27 (×7): qty 2

## 2016-03-27 MED ORDER — LACTATED RINGERS IV SOLN: INTRAVENOUS | Status: DC

## 2016-03-27 MED ORDER — BUPIVACAINE IN DEXTROSE 0.75-8.25 % IT SOLN
INTRATHECAL | Status: AC
  Filled 2016-03-27: qty 2

## 2016-03-27 MED ORDER — FENTANYL CITRATE (PF) 100 MCG/2ML IJ SOLN
25.0000 ug | INTRAMUSCULAR | Status: DC | PRN
  Administered 2016-03-27 (×3): 50 ug via INTRAVENOUS

## 2016-03-27 MED ORDER — ONDANSETRON HCL 4 MG/2ML IJ SOLN
INTRAMUSCULAR | Status: AC
  Filled 2016-03-27: qty 2

## 2016-03-27 MED ORDER — MENTHOL 3 MG MT LOZG: 1.0000 | LOZENGE | OROMUCOSAL | Status: DC | PRN

## 2016-03-27 MED ORDER — OXYCODONE-ACETAMINOPHEN 5-325 MG PO TABS: 1.0000 | ORAL_TABLET | ORAL | Status: DC | PRN

## 2016-03-27 MED ORDER — WITCH HAZEL-GLYCERIN EX PADS: 1.0000 "application " | MEDICATED_PAD | CUTANEOUS | Status: DC | PRN

## 2016-03-27 MED ORDER — ZOLPIDEM TARTRATE 5 MG PO TABS: 5.0000 mg | ORAL_TABLET | Freq: Every evening | ORAL | Status: DC | PRN

## 2016-03-27 MED ORDER — COCONUT OIL OIL: 1.0000 "application " | TOPICAL_OIL | Status: DC | PRN

## 2016-03-27 MED ORDER — DIBUCAINE 1 % RE OINT: 1.0000 "application " | TOPICAL_OINTMENT | RECTAL | Status: DC | PRN

## 2016-03-27 MED ORDER — SENNOSIDES-DOCUSATE SODIUM 8.6-50 MG PO TABS
2.0000 | ORAL_TABLET | ORAL | Status: DC
  Administered 2016-03-27 – 2016-03-30 (×3): 2 via ORAL
  Filled 2016-03-27 (×3): qty 2

## 2016-03-27 MED ORDER — DIPHENHYDRAMINE HCL 25 MG PO CAPS
25.0000 mg | ORAL_CAPSULE | ORAL | Status: DC | PRN
  Filled 2016-03-27: qty 1

## 2016-03-27 MED ORDER — PHENYLEPHRINE HCL 10 MG/ML IJ SOLN
INTRAMUSCULAR | Status: DC | PRN
  Administered 2016-03-27 (×2): 40 ug via INTRAVENOUS

## 2016-03-27 MED ORDER — SODIUM CHLORIDE 0.9% FLUSH
10.0000 mL | INTRAVENOUS | Status: DC | PRN
  Administered 2016-03-28 – 2016-03-29 (×2): 10 mL
  Filled 2016-03-27 (×3): qty 40

## 2016-03-27 MED ORDER — ONDANSETRON HCL 4 MG/2ML IJ SOLN
INTRAMUSCULAR | Status: DC | PRN
  Administered 2016-03-27: 4 mg via INTRAVENOUS

## 2016-03-27 MED ORDER — NALBUPHINE HCL 10 MG/ML IJ SOLN
5.0000 mg | Freq: Once | INTRAMUSCULAR | Status: AC | PRN
  Administered 2016-03-27: 5 mg via SUBCUTANEOUS

## 2016-03-27 MED ORDER — PHENYLEPHRINE 8 MG IN D5W 100 ML (0.08MG/ML) PREMIX OPTIME
INJECTION | INTRAVENOUS | Status: DC | PRN
  Administered 2016-03-27: 60 ug/min via INTRAVENOUS

## 2016-03-27 MED ORDER — SCOPOLAMINE 1 MG/3DAYS TD PT72
1.0000 | MEDICATED_PATCH | Freq: Once | TRANSDERMAL | Status: AC
  Administered 2016-03-27: 1.5 mg via TRANSDERMAL

## 2016-03-27 SURGICAL SUPPLY — 44 items
APL SKNCLS STERI-STRIP NONHPOA (GAUZE/BANDAGES/DRESSINGS) ×1
BENZOIN TINCTURE PRP APPL 2/3 (GAUZE/BANDAGES/DRESSINGS) ×1 IMPLANT
BINDER ABDOMINAL 10 UNV 27-48 (MISCELLANEOUS) ×1 IMPLANT
CLAMP CORD UMBIL (MISCELLANEOUS) IMPLANT
CLOSURE STERI STRIP 1/2 X4 (GAUZE/BANDAGES/DRESSINGS) ×2 IMPLANT
CLOTH BEACON ORANGE TIMEOUT ST (SAFETY) ×2 IMPLANT
CONTAINER PREFILL 10% NBF 15ML (MISCELLANEOUS) IMPLANT
DRSG OPSITE POSTOP 4X10 (GAUZE/BANDAGES/DRESSINGS) ×2 IMPLANT
DRSG TELFA 3X8 NADH (GAUZE/BANDAGES/DRESSINGS) ×4 IMPLANT
DURAPREP 26ML APPLICATOR (WOUND CARE) ×2 IMPLANT
ELECT REM PT RETURN 9FT ADLT (ELECTROSURGICAL) ×2
ELECTRODE REM PT RTRN 9FT ADLT (ELECTROSURGICAL) ×1 IMPLANT
EXTENDER TRAXI PANNICULUS (MISCELLANEOUS) IMPLANT
EXTRACTOR VACUUM M CUP 4 TUBE (SUCTIONS) IMPLANT
GLOVE BIO SURGEON STRL SZ7.5 (GLOVE) ×2 IMPLANT
GLOVE BIOGEL PI IND STRL 7.0 (GLOVE) ×1 IMPLANT
GLOVE BIOGEL PI INDICATOR 7.0 (GLOVE) ×1
GOWN STRL REUS W/TWL LRG LVL3 (GOWN DISPOSABLE) ×4 IMPLANT
KIT ABG SYR 3ML LUER SLIP (SYRINGE) IMPLANT
NDL HYPO 25X5/8 SAFETYGLIDE (NEEDLE) IMPLANT
NDL SPNL 20GX3.5 QUINCKE YW (NEEDLE) IMPLANT
NEEDLE HYPO 22GX1.5 SAFETY (NEEDLE) ×2 IMPLANT
NEEDLE HYPO 25X5/8 SAFETYGLIDE (NEEDLE) IMPLANT
NEEDLE SPNL 20GX3.5 QUINCKE YW (NEEDLE) IMPLANT
NS IRRIG 1000ML POUR BTL (IV SOLUTION) ×2 IMPLANT
PACK C SECTION WH (CUSTOM PROCEDURE TRAY) ×2 IMPLANT
PAD ABD 7.5X8 STRL (GAUZE/BANDAGES/DRESSINGS) ×2 IMPLANT
PAD DRESSING TELFA 3X8 NADH (GAUZE/BANDAGES/DRESSINGS) IMPLANT
PAD OB MATERNITY 4.3X12.25 (PERSONAL CARE ITEMS) ×2 IMPLANT
RETAINER VISCERAL (MISCELLANEOUS) ×1 IMPLANT
SUT MNCRL 0 VIOLET CTX 36 (SUTURE) ×2 IMPLANT
SUT MNCRL AB 3-0 PS2 27 (SUTURE) IMPLANT
SUT MON AB 2-0 CT1 27 (SUTURE) ×2 IMPLANT
SUT MON AB-0 CT1 36 (SUTURE) ×4 IMPLANT
SUT MONOCRYL 0 CTX 36 (SUTURE) ×2
SUT PLAIN 0 NONE (SUTURE) IMPLANT
SUT PLAIN 2 0 (SUTURE)
SUT PLAIN 2 0 XLH (SUTURE) IMPLANT
SUT PLAIN ABS 2-0 CT1 27XMFL (SUTURE) IMPLANT
SYR 20CC LL (SYRINGE) IMPLANT
SYR CONTROL 10ML LL (SYRINGE) ×2 IMPLANT
TOWEL OR 17X24 6PK STRL BLUE (TOWEL DISPOSABLE) ×2 IMPLANT
TRAXI PANNICULUS EXTENDER (MISCELLANEOUS) ×1
TRAY FOLEY CATH SILVER 14FR (SET/KITS/TRAYS/PACK) ×2 IMPLANT

## 2016-03-27 NOTE — H&P (Addendum)
Suzanne Wilcox is a 40 y.o. female presenting for rpt csection and TL. OB History    Gravida Para Term Preterm AB Living   10 2 2  0 7 2   SAB TAB Ectopic Multiple Live Births   6 1 0 0 2     Past Medical History:  Diagnosis Date  . Anxiety   . Bipolar disorder (HCC)   . Bipolar disorder (HCC)   . Depression   . GERD (gastroesophageal reflux disease)   . Hx of varicella   . Hypertension   . Missed abortions    X 6  . Obesity 04/29/2012  . Termination of pregnancy (fetus) 03/22/2004   X 1  . Vaginal Pap smear, abnormal    Past Surgical History:  Procedure Laterality Date  . CESAREAN SECTION  2011   X 1 [facility] - [place]  . CESAREAN SECTION N/A 05/22/2014   Procedure: Repeat CESAREAN SECTION;  Surgeon: Lenoard Adenichard J Teresha Hanks, MD;  Location: [facility];  Service: Obstetrics;  Laterality: N/A;  EDD: 05/31/14  . DILATION AND CURETTAGE OF UTERUS   2005, 2010     x2 MAB  . IR GENERIC HISTORICAL  03/26/2016   IR FLUORO GUIDE CV LINE RIGHT 03/26/2016 [facility]-INTERV RAD  . IR GENERIC HISTORICAL  03/26/2016   IR US GUIDE VASC ACCESS RIGHT 03/26/2016 [facility]-INTER[facility]  . WISDOM TOOTH EXTRACTION     Family History: family history includes Dementia in her father; Depression in her mother and paternal grandmother; Kidney Stones in her father; Pancreatic cancer in her father; Skin cancer in her mother and sister; Stroke in her father. Social History:  reports that she quit smoking about 6 years ago. Her smoking use included Cigarettes. She has a 36.00 pack-year smoking history. She has never used smokeless tobacco. She reports that she does not drink alcohol or use drugs.     Maternal Diabetes: No Genetic Screening: Normal Maternal Ultrasounds/Referrals: Normal Fetal Ultrasounds or other Referrals:  None Maternal Substance Abuse:  No Significant Maternal Medications:  Meds include: Other: see list Significant Maternal Lab Results:  None Other Comments:  None  ROS Maternal Medical History:  Fetal  activity: Perceived fetal activity is normal.    Prenatal complications: PIH and oligohydramnios.   Prenatal Complications - Diabetes: none.      Blood pressure (!) 158/110, pulse 93, temperature 98.4 F (36.9 C), temperature source Oral, resp. rate 16, SpO2 99 %, unknown if currently breastfeeding. Maternal Exam:  Uterine Assessment: Contraction strength is mild.  Abdomen: Surgical scars: low transverse.   Fetal presentation: vertex  Introitus: Normal vulva. Normal vagina.  Ferning test: not done.  Nitrazine test: not done. Amniotic fluid character: not assessed.  Pelvis: questionable for delivery.   Cervix: Cervix evaluated by digital exam.     Physical Exam  Nursing note and vitals reviewed. Constitutional: She is oriented to person, place, and time. She appears well-developed and well-nourished.  HENT:  Head: Normocephalic and atraumatic.  Neck: Normal range of motion. Neck supple.  Cardiovascular: Normal rate and regular rhythm.   Respiratory: Effort normal and breath sounds normal.  GI: Soft.  Genitourinary: Vagina normal and uterus normal.  Musculoskeletal: Normal range of motion.  Neurological: She is alert and oriented to person, place, and time. She has normal reflexes.  Skin: Skin is warm and dry. She is not diaphoretic.  Psychiatric: She has a normal mood and affect.    Prenatal labs: ABO, Rh: O/Positive/-- (03/01 0000) Antibody: Negative (03/01 0000) Rubella: Immune (  03/01 0000) RPR: Nonreactive (03/01 0000)  HBsAg: Negative (03/01 0000)  HIV: Non-reactive (03/01 0000)  GBS: Positive (09/07 0000)   Assessment/Plan: 39 week IUP Previous csection x 2 CHTN Depression Rpt csection and TL. Consent done. Risks vs benefits discussed.    Minard Millirons J 03/27/2016, 11:04 AM

## 2016-03-27 NOTE — Anesthesia Procedure Notes (Signed)
Spinal  Patient location during procedure: OR Preanesthetic Checklist Completed: patient identified, site marked, surgical consent, pre-op evaluation, timeout performed, IV checked, risks and benefits discussed and monitors and equipment checked Spinal Block Patient position: sitting Prep: DuraPrep Patient monitoring: cardiac monitor, continuous pulse ox, blood pressure and heart rate Approach: midline Location: L3-4 Injection technique: catheter Needle Needle type: Tuohy and Sprotte  Needle gauge: 24 G Needle length: 12.7 cm Needle insertion depth: 9 cm Catheter type: closed end flexible Catheter size: 19 g Catheter at skin depth: 18 cm Assessment Sensory level: T4 Additional Notes Spinal Dosage in OR  Bupivicaine ml       1.8 PFMS04   mcg        100 Fentanyl mcg            25  (-) asp Heme/CSF

## 2016-03-27 NOTE — Progress Notes (Signed)
Patient seen and examined. Consent witnessed and signed. No changes noted. Update completed.Patient ID: Suzanne Wilcox, female   DOB: 10/03/1975, 40 y.o.   MRN: [id number]

## 2016-03-27 NOTE — Transfer of Care (Signed)
Immediate Anesthesia Transfer of Care Note  Patient: Suzanne Wilcox  Procedure(s) Performed: Procedure(s) with comments: Repeat CESAREAN SECTION WITH BILATERAL TUBAL LIGATION (Bilateral) - EDD: 04/01/16  Patient Location: PACU  Anesthesia Type:Spinal  Level of Consciousness: awake, alert , oriented and patient cooperative  Airway & Oxygen Therapy: Patient Spontanous Breathing  Post-op Assessment: Report given to RN and Post -op Vital signs reviewed and stable  Post vital signs: Reviewed and stable  Last Vitals:  Vitals:   03/27/16 1002 03/27/16 1008  BP: (!) 154/115 (!) 158/110  Pulse: 93   Resp: 16   Temp: 36.9 C     Last Pain:  Vitals:   03/27/16 1002  TempSrc: Oral  PainSc: 2       Patients Stated Pain Goal: 5 (03/27/16 1002)  Complications: No apparent anesthesia complications

## 2016-03-27 NOTE — Anesthesia Preprocedure Evaluation (Addendum)
Anesthesia Evaluation  Patient identified by MRN, date of birth, ID band Patient awake    Reviewed: Allergy & Precautions, H&P , Patient's Chart, lab work & pertinent test results  Airway Mallampati: II  TM Distance: >3 FB Neck ROM: full    Dental no notable dental hx.    Pulmonary former smoker,    Pulmonary exam normal breath sounds clear to auscultation       Cardiovascular Exercise Tolerance: Good hypertension,  Rhythm:regular Rate:Normal     Neuro/Psych    GI/Hepatic GERD  ,  Endo/Other    Renal/GU      Musculoskeletal   Abdominal   Peds  Hematology   Anesthesia Other Findings   Reproductive/Obstetrics                            Anesthesia Physical Anesthesia Plan  ASA: III  Anesthesia Plan: Combined Spinal and Epidural   Post-op Pain Management:    Induction:   Airway Management Planned:   Additional Equipment:   Intra-op Plan:   Post-operative Plan:   Informed Consent: I have reviewed the patients History and Physical, chart, labs and discussed the procedure including the risks, benefits and alternatives for the proposed anesthesia with the patient or authorized representative who has indicated his/her understanding and acceptance.   Dental Advisory Given  Plan Discussed with: CRNA  Anesthesia Plan Comments: (Lab work confirmed with CRNA in room. Platelets okay. Discussed spinal anesthetic, and patient consents to the procedure:  included risk of possible headache,backache, failed block, allergic reaction, and nerve injury. This patient was asked if she had any questions or concerns before the procedure started. )        Anesthesia Quick Evaluation

## 2016-03-27 NOTE — Progress Notes (Signed)
Breastfeeding

## 2016-03-28 ENCOUNTER — Encounter (HOSPITAL_COMMUNITY): Payer: Self-pay | Admitting: Obstetrics and Gynecology

## 2016-03-28 DIAGNOSIS — N9989 Other postprocedural complications and disorders of genitourinary system: Secondary | ICD-10-CM

## 2016-03-28 DIAGNOSIS — R338 Other retention of urine: Secondary | ICD-10-CM

## 2016-03-28 HISTORY — DX: Other postprocedural complications and disorders of genitourinary system: N99.89

## 2016-03-28 LAB — URINALYSIS, ROUTINE W REFLEX MICROSCOPIC
BILIRUBIN URINE: NEGATIVE
Glucose, UA: NEGATIVE mg/dL
Ketones, ur: NEGATIVE mg/dL
Leukocytes, UA: NEGATIVE
Nitrite: NEGATIVE
PROTEIN: NEGATIVE mg/dL
Specific Gravity, Urine: 1.01 (ref 1.005–1.030)
pH: 5.5 (ref 5.0–8.0)

## 2016-03-28 LAB — URINE MICROSCOPIC-ADD ON: Bacteria, UA: NONE SEEN

## 2016-03-28 LAB — BASIC METABOLIC PANEL
Anion gap: 6 (ref 5–15)
BUN: 14 mg/dL (ref 6–20)
CO2: 24 mmol/L (ref 22–32)
CREATININE: 0.68 mg/dL (ref 0.44–1.00)
Calcium: 8.8 mg/dL — ABNORMAL LOW (ref 8.9–10.3)
Chloride: 106 mmol/L (ref 101–111)
GFR calc Af Amer: >60 mL/min (ref >60)
Glucose, Bld: 126 mg/dL — ABNORMAL HIGH (ref 65–99)
POTASSIUM: 3.8 mmol/L (ref 3.5–5.1)
SODIUM: 136 mmol/L (ref 135–145)

## 2016-03-28 LAB — CBC
HCT: 29.6 % — ABNORMAL LOW (ref 36.0–46.0)
Hemoglobin: 10.1 g/dL — ABNORMAL LOW (ref 12.0–15.0)
MCH: 29.3 pg (ref 26.0–34.0)
MCHC: 34.1 g/dL (ref 30.0–36.0)
MCV: 85.8 fL (ref 78.0–100.0)
PLATELETS: 218 10*3/uL (ref 150–400)
RBC: 3.45 MIL/uL — AB (ref 3.87–5.11)
RDW: 14.1 % (ref 11.5–15.5)
WBC: 14.3 10*3/uL — ABNORMAL HIGH (ref 4.0–10.5)

## 2016-03-28 LAB — BIRTH TISSUE RECOVERY COLLECTION (PLACENTA DONATION)

## 2016-03-28 LAB — RPR: RPR Ser Ql: NONREACTIVE

## 2016-03-28 MED ORDER — FAMOTIDINE 20 MG PO TABS
20.0000 mg | ORAL_TABLET | Freq: Two times a day (BID) | ORAL | Status: DC
  Administered 2016-03-28 – 2016-03-30 (×5): 20 mg via ORAL
  Filled 2016-03-28 (×6): qty 1

## 2016-03-28 MED ORDER — LACTATED RINGERS IV BOLUS (SEPSIS)
500.0000 mL | Freq: Once | INTRAVENOUS | Status: AC
  Administered 2016-03-28: 500 mL via INTRAVENOUS

## 2016-03-28 MED ORDER — FUROSEMIDE 10 MG/ML IJ SOLN
20.0000 mg | Freq: Once | INTRAMUSCULAR | Status: AC
  Administered 2016-03-28: 20 mg via INTRAVENOUS
  Filled 2016-03-28: qty 2

## 2016-03-28 NOTE — Anesthesia Postprocedure Evaluation (Signed)
Anesthesia Post Note  Patient: Suzanne Wilcox  Procedure(s) Performed: * No procedures listed *  Patient location during evaluation: Mother Baby Anesthesia Type: Spinal Level of consciousness: awake Pain management: satisfactory to patient Vital Signs Assessment: post-procedure vital signs reviewed and stable Respiratory status: spontaneous breathing Cardiovascular status: stable Anesthetic complications: no     Last Vitals:  Vitals:   03/28/16 0240 03/28/16 0634  BP: 126/68 124/70  Pulse: 76 82  Resp: 20 18  Temp: 36.8 C 37.1 C    Last Pain:  Vitals:   03/28/16 0635  TempSrc:   PainSc: 0-No pain   Pain Goal: Patients Stated Pain Goal: 5 (03/27/16 1002)               Cephus ShellingBURGER,Kenise Barraco

## 2016-03-28 NOTE — Lactation Note (Signed)
This note was copied from a baby's chart. Lactation Consultation Note  Patient Name: Suzanne Wilcox ZOXWR'UToday's Date: 03/28/2016 Reason for consult: Initial assessment Infant is 7426 hours old & seen by Lactation for initial assessment. Baby was born at 7562w2d and weighed 7 lbs 3.2 oz at birth. Baby was in crib when Suzanne Wilcox entered & mom was eating. Mom reports she has been BF with the nipple shield and feels as thought BF is going ok but would like assistance at next feeding. Mom reported she exclusively pumped with her other children for 4-6 wks each. Mom reports she plans to rent a pump from [facility] because her insurance will cover it. Provided mom with BF booklet, BF resources, & feeding log; mom made aware of O/P services, breastfeeding support groups, community resources, and our phone # for post-discharge questions.  Mom reports no questions at this time. Mom to ask for Lactation when time for next feeding & LC will plan to bring paperwork for the pump rental to fill out (discussed how she'll get the pump on day of discharge).  Maternal Data Does the patient have breastfeeding experience prior to this delivery?: Yes  Feeding    LATCH Score/Interventions                      Lactation Tools Discussed/Used     Consult Status Consult Status: Follow-up Date: 03/29/16 Follow-up type: In-patient    Suzanne Wilcox 03/28/2016, 3:46 PM

## 2016-03-28 NOTE — Progress Notes (Addendum)
Patient ID: Suzanne Wilcox, female   DOB: 08/19/1975, 40 y.o.   MRN: [id number] Subjective: S/P Elective Repeat Cesarean Delivery with Bilateral Tubal Ligation POD# 1 Information for the patient's newborn:  Suzanne Wilcox, Suzanne Wilcox [[id number]][[id number]]  female   Reports feeling very sleepy. Had not slept in 2 days. "Feels better than with last 2 cesarean deliveries". Feeding: breast Patient reports tolerating PO.  Breast symptoms: wearing nipples shields Pain controlled with ibuprofen (OTC) and narcotic analgesics including Percocet Denies HA/SOB/C/P/N/V/dizziness. Flatus absent. No BM. She reports vaginal bleeding as normal, without clots.  She is ambulating, Foley indwelling, some red staining in tubing small amount of clear urine draining to gravity.      Objective:   VS:  Vitals:   03/27/16 2230 03/28/16 0240 03/28/16 0634 03/28/16 1054  BP: 131/71 126/68 124/70 122/70  Pulse: 82 76 82 84  Resp: 20 20 18 18   Temp: 99 F (37.2 C) 98.3 F (36.8 C) 98.7 F (37.1 C) 98.4 F (36.9 C)  TempSrc: Oral Oral Oral Oral  SpO2: 97% 97% 95%       Intake/Output Summary (Last 24 hours) at 03/28/16 1438 Last data filed at 03/28/16 1413  Gross per 24 hour  Intake           1177.5 ml  Output             1045 ml  Net            132.5 ml         Recent Labs  03/27/16 1050 03/28/16 0520  WBC 10.9* 14.3*  HGB 12.2 10.1*  HCT 35.8* 29.6*  PLT 238 218     Blood type: O POS (09/29 1050)  Rubella: Immune (03/01 0000)     Physical Exam:   General: cooperative, fatigued, no distress, morbidly obese and sleeping off and on during exam  CV: Regular rate and rhythm, S1S2 present or without murmur or extra heart sounds  Resp: clear  Abdomen: soft, nontender, normal bowel sounds  Incision: pressure dressing covering incision  Uterine Fundus: firm, 1 FB below umbilicus, nontender  Lochia: minimal  Ext: edema 2+ and Homans sign is negative, no sign of DVT   Assessment/Plan: 40 y.o.    POD# 1.  S/P Cesarean Delivery.  Indications: elective repeat with bilateral tubal ligation                Principal Problem:   Postpartum care following cesarean delivery (9/29) Active Problems:   CHTN   Depression   Postoperative retention of urine  Doing well, stable.               Regular diet as tolerated Continue foley per unit protocol Lasix 20 mg IVP x 1 dose Hold Labetalol dose, if BP not in severe range 160/110 Remove pressure dressing Continue use of abdominal binder Ambulate 2-3 times in hallway Routine post-op care  *Consult Dr. Billy Coastaavon on choice of med for urinary retention / recommend Lasix 20 mg IVP x 1 dose  Raelyn MoraAWSON, Yamaris Cummings, M, MSN, CNM 03/28/2016, 8:51 AM

## 2016-03-28 NOTE — Op Note (Signed)
Cesarean Section Procedure Note  Indications: previous uterine incision kerr x2  Elective TL  Pre-operative Diagnosis: 39 week 3 day pregnancy.  Post-operative Diagnosis: same  Surgeon: Lenoard AdenAAVON,Jonelle Bann J   Assistants: Ernestina PennaFogleman, MD  Anesthesia: Local anesthesia 0.25.% bupivacaine and Spinal anesthesia  ASA Class: 3  Procedure Details  The patient was seen in the Holding Room. The risks, benefits, complications, treatment options, and expected outcomes were discussed with the patient.  The patient concurred with the proposed plan, giving informed consent. The risks of anesthesia, infection, bleeding and possible injury to other organs discussed. Injury to bowel, bladder, or ureter with possible need for repair discussed. Possible need for transfusion with secondary risks of hepatitis or HIV acquisition discussed. Post operative complications to include but not limited to DVT, PE and Pneumonia noted. The site of surgery properly noted/marked. The patient was taken to Operating Room # 9, identified as Suzanne Wilcox and the procedure verified as C-Section Delivery. A Time Out was held and the above information confirmed.  After induction of anesthesia, the patient was draped and prepped in the usual sterile manner. A Pfannenstiel incision was made and carried down through the subcutaneous tissue to the fascia. Fascial incision was made and extended transversely using Mayo scissors. The fascia was separated from the underlying rectus tissue superiorly and inferiorly. The peritoneum was identified and entered. Peritoneal incision was extended longitudinally. The utero-vesical peritoneal reflection was incised transversely and the bladder flap was bluntly freed from the lower uterine segment. A low transverse uterine incision(Kerr hysterotomy) was made. Delivered from OA presentation was a  female with Apgar scores of 8 at one minute and 9 at five minutes. Bulb suctioning gently performed.  Neonatal team in attendance.After the umbilical cord was clamped and cut cord blood was obtained for evaluation. The placenta was removed intact and appeared normal. The uterus was curetted with a dry lap pack. Good hemostasis was noted.The uterine outline, tubes and ovaries appeared normal. The uterine incision was closed with running locked sutures of 0 Monocryl x 1 layers. Hemostasis was observed. Modified Pomeroy tubal ligation performed. The parietal peritoneum was closed with a running 2-0 Monocryl suture. The fascia was then reapproximated with running sutures of 0 Monocryl. The skin was reapproximated with 3-0 monocryl after Kent closure with 2-0 plain.  Instrument, sponge, and needle counts were correct prior the abdominal closure and at the conclusion of the case.   Findings: As noted  Estimated Blood Loss:  300 mL         Drains: foley                 Specimens: placenta and tubal segments                 Complications:  None; patient tolerated the procedure well.         Disposition: PACU - hemodynamically stable.         Condition: stable  Attending Attestation: I performed the procedure.    Patient ID: Suzanne Wilcox, female   DOB: 07/23/1975, 40 y.o.   MRN: [id number]

## 2016-03-28 NOTE — Lactation Note (Signed)
This note was copied from a baby's chart. Lactation Consultation Note  Patient Name: Suzanne Wilcox ZOXWR'UToday's Date: 03/28/2016 Reason for consult: Follow-up assessment Infant is 1028 hours old & seen by Lactation for follow-up assessment. Nurse called LC to come assist with latch. When LC entered, RN had already gotten baby latched with the size 24 nipple shield in football hold on right breast but mom was saying she felt pinching. Mom has a lot of breast tissue and is unable to see how baby is latched; baby had a shallow latch and was just on the shaft of the nipple shield. Unlatched baby & tried to re-latch but baby would not open her mouth wide & started crying. Had mom hold baby to calm her and did some hand expressing- drops were seen. Tried latching baby without the nipple shield but baby cried & would not open wide. Mom requested a DEBP so she could pump & give baby her milk that way. LC set up DEBP & mom started pumping; discussed use & cleaning of pump parts. Encouraged mom to continue working on BF at every feeding first and then pumping afterwards. Mom reports no questions at this time & knows to ask for assistance as needed at future feedings.  Maternal Data Does the patient have breastfeeding experience prior to this delivery?: Yes  Feeding Feeding Type: Breast Fed Length of feed: 5 min  LATCH Score/Interventions Latch: Repeated attempts needed to sustain latch, nipple held in mouth throughout feeding, stimulation needed to elicit sucking reflex. Intervention(s): Assist with latch;Breast compression;Adjust position  Audible Swallowing: None Intervention(s): Hand expression;Skin to skin  Type of Nipple: Flat  Comfort (Breast/Nipple): Soft / non-tender     Hold (Positioning): Assistance needed to correctly position infant at breast and maintain latch. Intervention(s): Support Pillows;Position options;Breastfeeding basics reviewed;Skin to skin  LATCH Score:  5  Lactation Tools Discussed/Used Tools: Nipple Shields Nipple shield size: 24 Breast pump type: Double-Electric Breast Pump Pump Review: Setup, frequency, and cleaning   Consult Status Consult Status: Follow-up Date: 03/29/16 Follow-up type: In-patient    Oneal GroutLaura C Edilberto Roosevelt 03/28/2016, 6:02 PM

## 2016-03-28 NOTE — Clinical Social Work Maternal (Signed)
  CLINICAL SOCIAL WORK MATERNAL/CHILD NOTE  Patient Details  Name: Suzanne Wilcox MRN: [id number] Date of Birth: 03/09/1976  Date:  03/28/2016  Clinical Social Worker Initiating Note:  Ferdinand Lango Starling Jessie, MSW, LCSW-A Date/ Time Initiated:  03/28/16/1553     Child's Name:  Suzanne Wilcox    Legal Guardian:  Other (Comment) (MOB and FOB)   Need for Interpreter:  None   Date of Referral:  03/27/16     Reason for Referral:  Other (Comment) (MOB hx of Bipolar / PPD)   Referral Source:  Physician   Address:  [address] [place] [address]  Phone number:  [phone]   Household Members:  Self, Minor Children, Significant Other   Natural Supports (not living in the home):  Immediate Family, Friends, Extended Family   Professional Supports: Therapist   Employment: Unemployed   Type of Work:     Education:  9 to 11 years   Museum/gallery curator Resources:  Medicaid   Other Resources:  [facility]   Cultural/Religious Considerations Which May Impact Care:  None reported.   Strengths:  Ability to meet basic needs , Home prepared for child , Pediatrician chosen    Risk Factors/Current Problems:  Mental Health Concerns    Cognitive State:  Alert , Goal Oriented    Mood/Affect:  Calm , Comfortable , Interested    CSW Assessment: CSW met with MOB at bedside. This Probation officer discussed role and reasoning for visit being due to MOB hx of bipolar/PPD. At this time, MOB notes experiencing PPD in a previous pregnancy which then triggered her Bipolar. This Probation officer encouraged MOB to be very aware of her feelings and if she feels symptoms coming on to contact her OBGYN. MOB verbalizes understanding. This Probation officer discussed SIDS. MOB verbalized understanding. At this time, MOB declines any other needs and notes she is hoping she will get approved for food stamps after having this baby. No other needs were addressed or requested. Case closed to this CSW.   CSW Plan/Description:  No Further Intervention  Required/No Barriers to Discharge    Oda Cogan, MSW, [facility]  Office: [phone]

## 2016-03-28 NOTE — Anesthesia Postprocedure Evaluation (Signed)
Anesthesia Post Note  Patient: Suzanne Wilcox  Procedure(s) Performed: Procedure(s) (LRB): Repeat CESAREAN SECTION WITH BILATERAL TUBAL LIGATION (Bilateral)  Patient location during evaluation: PACU Anesthesia Type: Spinal Level of consciousness: awake Pain management: satisfactory to patient Vital Signs Assessment: post-procedure vital signs reviewed and stable Respiratory status: spontaneous breathing Cardiovascular status: blood pressure returned to baseline Postop Assessment: no headache and spinal receding Anesthetic complications: no     Last Vitals:  Vitals:   03/28/16 0240 03/28/16 0634  BP: 126/68 124/70  Pulse: 76 82  Resp: 20 18  Temp: 36.8 C 37.1 C    Last Pain:  Vitals:   03/28/16 0635  TempSrc:   PainSc: 0-No pain   Pain Goal: Patients Stated Pain Goal: 5 (03/27/16 1002)               Krosby Ritchie EDWARD

## 2016-03-29 ENCOUNTER — Encounter (HOSPITAL_COMMUNITY): Payer: Self-pay | Admitting: Obstetrics and Gynecology

## 2016-03-29 MED ORDER — HYDROCHLOROTHIAZIDE 25 MG PO TABS
25.0000 mg | ORAL_TABLET | Freq: Every day | ORAL | Status: DC
  Administered 2016-03-29 – 2016-03-30 (×2): 25 mg via ORAL
  Filled 2016-03-29 (×3): qty 1

## 2016-03-29 MED ORDER — LABETALOL HCL 100 MG PO TABS
100.0000 mg | ORAL_TABLET | Freq: Two times a day (BID) | ORAL | Status: DC
  Administered 2016-03-29 – 2016-03-30 (×2): 100 mg via ORAL
  Filled 2016-03-29 (×3): qty 1

## 2016-03-29 MED ORDER — SPIRONOLACTONE-HCTZ 25-25 MG PO TABS: 1.0000 | ORAL_TABLET | Freq: Every day | ORAL | Status: DC

## 2016-03-29 MED ORDER — SPIRONOLACTONE 25 MG PO TABS
25.0000 mg | ORAL_TABLET | Freq: Every day | ORAL | Status: DC
  Administered 2016-03-29 – 2016-03-30 (×2): 25 mg via ORAL
  Filled 2016-03-29 (×3): qty 1

## 2016-03-29 NOTE — Lactation Note (Signed)
This note was copied from a baby's chart. Lactation Consultation Note: Follow up visit with mom. She has just gotten out of the shower and is in some pain. Friend present and very supportive of breast feeding. Mom using NS but states she can not see her nipple. Reports nipples are red and burning. Has pumped some but is not obtaining any Colostrum. Encouragement given to continue pumping to promote a good milk supply. Has been giving bottles of formula. Baby asleep in bassinet at present. Encouraged to page for assist when she feels better and baby ready for feeding. No questions at present.   Patient Name: Suzanne Wilcox's Date: 03/29/2016 Reason for consult: Follow-up assessment   Maternal Data Formula Feeding for Exclusion: No Does the patient have breastfeeding experience prior to this delivery?: Yes  Feeding Feeding Type: Formula Nipple Type: Slow - flow  LATCH Score/Interventions                      Lactation Tools Discussed/Used     Consult Status Consult Status: Follow-up Date: 03/30/16 Follow-up type: In-patient    Pamelia HoitWeeks, Fowler Antos D 03/29/2016, 2:37 PM

## 2016-03-29 NOTE — Progress Notes (Addendum)
Subjective: POD# 2 Information for the patient's newborn:  Suzanne Wilcox, Suzanne Wilcox [[id number]][[id number]]  female   Baby name: Suzanne Wilcox  Reports feeling OK today. Has been up to BR on her own and ambulated in hallway once.  Feeding: breast, difficulty latching 2/2 body habitus, pendulous breasts, and inverted nipples Patient reports tolerating PO.  Breast symptoms: nipple bruise bilateral. Pain controlled with PO meds Denies HA/SOB/C/P/N/V/dizziness. Flatus present, no BM. She reports vaginal bleeding as normal, without clots.  Uurinating without difficulty.     Objective:   VS:    Vitals:   03/28/16 1727 03/28/16 2110 03/29/16 0600 03/29/16 0640  BP: (!) 142/71 127/71 (!) 123/50 118/64  Pulse: 94 81 89 85  Resp: 18   20  Temp: 97.9 F (36.6 C)  97.5 F (36.4 C) 97.5 F (36.4 C)  TempSrc: Oral  Oral Oral  SpO2:         Intake/Output Summary (Last 24 hours) at 03/29/16 0833 Last data filed at 03/29/16 0530  Gross per 24 hour  Intake             2970 ml  Output             3350 ml  Net             -380 ml        Recent Labs  03/27/16 1050 03/28/16 0520  WBC 10.9* 14.3*  HGB 12.2 10.1*  HCT 35.8* 29.6*  PLT 238 218     Blood type: --/--/O POS (09/29 1050)  Rubella: Immune (03/01 0000)     Physical Exam:  General: alert, cooperative and no distress CV: Regular rate and rhythm Resp: clear Abdomen: soft, mild tenderness, +BS Incision: presure dressing with minimal drainage DC'ed, steristrips over suttures, incision healing well, no redness, no swelling. Honeycomb dressing applied.  Uterine Fundus: unable to palpate Lochia: minimal Ext: edema generalized and dependent up to thigh, bilaterally symetric, no redness or tenderness    Assessment/Plan: 40 y.o.   POD# 2. [id number]                  Principal Problem:   Postpartum care following cesarean delivery (9/29) Active Problems:   Delivered by cesarean section   Dependent edema   Morbid obesity   Chronic HTN on  labetalol  Stable post-op    Normotensive  Will Decrease Labetalol to 100 mg PO BID, add HCTZ 25 mg PO daily x 7 days Encouraged ambulation few times per day in unit Breastfeeding support - LC today Continue strict I&O Anticipate DC in AM   POC in consult w/ Dr. Alvin Critchleyaavon  Daniela C Paul, CNM, MSN 03/29/2016, 8:33 AM

## 2016-03-30 ENCOUNTER — Encounter (HOSPITAL_COMMUNITY)
Admission: RE | Admit: 2016-03-30 | Discharge: 2016-03-30 | Disposition: A | Discharge status: Home / Self Care | Payer: BLUE CROSS/BLUE SHIELD | Source: Ambulatory Visit | Attending: Obstetrics and Gynecology | Admitting: Obstetrics and Gynecology

## 2016-03-30 MED ORDER — HYDROCHLOROTHIAZIDE 25 MG PO TABS: 25.0000 mg | ORAL_TABLET | Freq: Every day | ORAL | 0 refills | Status: DC

## 2016-03-30 MED ORDER — OXYCODONE-ACETAMINOPHEN 5-325 MG PO TABS: 1.0000 | ORAL_TABLET | ORAL | 0 refills | Status: DC | PRN

## 2016-03-30 MED ORDER — IBUPROFEN 800 MG PO TABS: 800.0000 mg | ORAL_TABLET | Freq: Three times a day (TID) | ORAL | 1 refills | Status: DC | PRN

## 2016-03-30 NOTE — Lactation Note (Addendum)
This note was copied from a baby's chart. Lactation Consultation Note  Mother states her nipples are very sore so she is not breastfeeding but wants to rent a pump monthly. States she had trouble breastfeeding with other daughters so plans to pump. Recommend pumping every 3 hours to establish her milk supply. Mother will call IBCLC when paper work is ready.  Completed pump rental.  Encouraged mother to give pumped breastmilk before formula.  Reviewed engorgement care and monitoring voids/stools.   Patient Name: Suzanne Wilcox ZOXWR'UToday's Date: 03/30/2016     Maternal Data    Feeding    LATCH Score/Interventions                      Lactation Tools Discussed/Used     Consult Status      Dahlia ByesBerkelhammer, Ruth [facility] 03/30/2016, 10:42 AM

## 2016-03-30 NOTE — Progress Notes (Signed)
Patient ID: Tomie Chinaanya A Gozick-Rudzinski, female   DOB: 04/10/1976, 40 y.o.   MRN: [id number] Subjective: S/P Elective Repeat Cesarean Delivery with Bilateral Tubal Ligation POD# 3 Information for the patient's newborn:  Belinda FisherGozick-Alkema, Girl Daryl [[id number]][[id number]]  female   Reports feeling sleepy, but much better today. Ready for D/C home today. Feeding: breast & bottle Patient reports tolerating PO.  Breast symptoms: wearing nipples shields Pain controlled with ibuprofen (OTC) and narcotic analgesics including Percocet Denies HA/SOB/C/P/N/V/dizziness. Flatus present. No BM. She reports vaginal bleeding as normal, without clots.  She is ambulating, urinating on her own without difficulty.      Objective:   VS:  Vitals:   03/29/16 1815 03/29/16 2216 03/30/16 0004 03/30/16 0509  BP: 140/76 (!) 156/86 (!) 149/81 138/75  Pulse:  (!) 104 (!) 105 95  Resp:    20  Temp:    98.4 F (36.9 C)  TempSrc:    Oral  SpO2:          Intake/Output Summary (Last 24 hours) at 03/30/16 [id number] Last data filed at 03/30/16 0500  Gross per 24 hour  Intake             1950 ml  Output             2300 ml  Net             -350 ml         Recent Labs  03/27/16 1050 03/28/16 0520  WBC 10.9* 14.3*  HGB 12.2 10.1*  HCT 35.8* 29.6*  PLT 238 218     Blood type: O POS (09/29 1050)  Rubella: Immune (03/01 0000)     Physical Exam:   General: cooperative, fatigued, no distress, morbidly obese and sleeping off and on during exam  CV: Regular rate and rhythm, S1S2 present or without murmur or extra heart sounds  Resp: clear  Abdomen: soft, nontender, normal bowel sounds  Incision: pressure dressing covering incision  Uterine Fundus: firm, 1 FB below umbilicus, nontender - difficult to palpate due to maternal body habitus  Lochia: minimal  Ext: edema 2+ and Homans sign is negative, no sign of DVT   Assessment/Plan: 40 y.o.   POD# 3.  S/P Cesarean Delivery.  Indications: elective repeat with bilateral tubal  ligation                Principal Problem:   Postpartum care following cesarean delivery (9/29) Active Problems:   CHTN   Depression   Postoperative retention of urine - resolved  Doing well, stable.               Regular diet as tolerated Continue foley per unit protocol Hold Labetalol dose, if BP not in severe range 160/110 Continue use of abdominal binder during awake hours Ambulate 2-3 times in hallway Routine post-op care   Raelyn MoraAWSON, Hannie Shoe, M, MSN, CNM 03/30/2016, 8:51 AM

## 2016-03-30 NOTE — Discharge Instructions (Signed)
Breast Pumping Tips °If you are breastfeeding, there may be times when you cannot feed your baby directly. Returning to work or going on a trip are common examples. Pumping allows you to store breast milk and feed it to your baby later.  °You may not get much milk when you first start to pump. Your breasts should start to make more after a few days. If you pump at the times you usually feed your baby, you may be able to keep making enough milk to feed your baby without also using formula. The more often you pump, the more milk you will produce.  °WHEN SHOULD I PUMP?  °· You can begin to pump soon after delivery. However, some experts recommend waiting about 4 weeks before giving your infant a bottle to make sure breastfeeding is going well.  °· If you plan to return to work, begin pumping a few weeks before. This will help you develop techniques that work best for you. It also lets you build up a supply of breast milk.   °· When you are with your infant, feed on demand and pump after each feeding.   °· When you are away from your infant for several hours, pump for about 15 minutes every 2-3 hours. Pump both breasts at the same time if you can.   °· If your infant has a formula feeding, make sure to pump around the same time.     °· If you drink any alcohol, wait 2 hours before pumping.   °HOW DO I PREPARE TO PUMP? °Your let-down reflex is the natural reaction to stimulation that makes your breast milk flow. It is easier to stimulate this reflex when you are relaxed. Find relaxation techniques that work for you. If you have difficulty with your let-down reflex, try these methods:  °· Smell one of your infant's blankets or an item of clothing.   °· Look at a picture or video of your infant.   °· Sit in a quiet, private space.   °· Massage the breast you plan to pump.   °· Place soothing warmth on the breast.   °· Play relaxing music.   °WHAT ARE SOME GENERAL BREAST PUMPING TIPS? °· Wash your hands before you pump. You  do not need to wash your nipples or breasts. °· There are three ways to pump. °· You can use your hand to massage and compress your breast. °· You can use a handheld manual pump. °· You can use an electric pump.   °· Make sure the suction cup (flange) on the breast pump is the right size. Place the flange directly over the nipple. If it is the wrong size or placed the wrong way, it may be painful and cause nipple damage.   °· If pumping is uncomfortable, apply a small amount of purified or modified lanolin to your nipple and areola. °· If you are using an electric pump, adjust the speed and suction power to be more comfortable. °· If pumping is painful or if you are not getting very much milk, you may need a different type of pump. A lactation consultant can help you determine what type of pump to use.   °· Keep a full water bottle near you at all times. Drinking lots of fluid helps you make more milk.  °· You can store your milk to use later. Pumped breast milk can be stored in a sealable, sterile container or plastic bag. Label all stored breast milk with the date you pumped it. °· Milk can stay out at room temperature for up to 8 hours. °·   You can store your milk in the refrigerator for up to 8 days. °· You can store your milk in the freezer for 3 months. Thaw frozen milk using warm water. Do not put it in the microwave. °· Do not smoke. Smoking can lower your milk supply and harm your infant. If you need help quitting, ask your health care provider to recommend a program.   °WHEN SHOULD I CALL MY HEALTH CARE PROVIDER OR A LACTATION CONSULTANT? °· You are having trouble pumping. °· You are concerned that you are not making enough milk. °· You have nipple pain, soreness, or redness. °· You want to use birth control. Birth control pills may lower your milk supply. Talk to your health care provider about your options. °  °This information is not intended to replace advice given to you by your health care provider.  Make sure you discuss any questions you have with your health care provider. °  °Document Released: 12/03/2009 Document Revised: 06/20/2013 Document Reviewed: 04/07/2013 °[employer]vier Interactive Patient Education ©2016 [employer] °Postpartum Depression and Baby Blues °The postpartum period begins right after the birth of a baby. During this time, there is often a great amount of joy and excitement. It is also a time of many changes in the life of the parents. Regardless of how many times a mother gives birth, each child brings new challenges and dynamics to the family. It is not unusual to have feelings of excitement along with confusing shifts in moods, emotions, and thoughts. All mothers are at risk of developing postpartum depression or the "baby blues." These mood changes can occur right after giving birth, or they may occur many months after giving birth. The baby blues or postpartum depression can be mild or severe. Additionally, postpartum depression can go away rather quickly, or it can be a long-term condition.  °CAUSES °Raised hormone levels and the rapid drop in those levels are thought to be a main cause of postpartum depression and the baby blues. A number of hormones change during and after pregnancy. Estrogen and progesterone usually decrease right after the delivery of your baby. The levels of thyroid hormone and various cortisol steroids also rapidly drop. Other factors that play a role in these mood changes include major life events and genetics.  °RISK FACTORS °If you have any of the following risks for the baby blues or postpartum depression, know what symptoms to watch out for during the postpartum period. Risk factors that may increase the likelihood of getting the baby blues or postpartum depression include: °· Having a personal or family history of depression.   °· Having depression while being pregnant.   °· Having premenstrual mood issues or mood issues related to oral  contraceptives. °· Having a lot of life stress.   °· Having marital conflict.   °· Lacking a social support network.   °· Having a baby with special needs.   °· Having health problems, such as diabetes.   °SIGNS AND SYMPTOMS °Symptoms of baby blues include: °· Brief changes in mood, such as going from extreme happiness to sadness. °· Decreased concentration.   °· Difficulty sleeping.   °· Crying spells, tearfulness.   °· Irritability.   °· Anxiety.   °Symptoms of postpartum depression typically begin within the first month after giving birth. These symptoms include: °· Difficulty sleeping or excessive sleepiness.   °· Marked weight loss.   °· Agitation.   °· Feelings of worthlessness.   °· Lack of interest in activity or food.   °Postpartum psychosis is a very serious condition and can be dangerous. Fortunately, it is   rare. Displaying any of the following symptoms is cause for immediate medical attention. Symptoms of postpartum psychosis include:  °· Hallucinations and delusions.   °· Bizarre or disorganized behavior.   °· Confusion or disorientation.   °DIAGNOSIS  °A diagnosis is made by an evaluation of your symptoms. There are no medical or lab tests that lead to a diagnosis, but there are various questionnaires that a health care provider may use to identify those with the baby blues, postpartum depression, or psychosis. Often, a screening tool called the Edinburgh Postnatal Depression Scale is used to diagnose depression in the postpartum period.  °TREATMENT °The baby blues usually goes away on its own in 1-2 weeks. Social support is often all that is needed. You will be encouraged to get adequate sleep and rest. Occasionally, you may be given medicines to help you sleep.  °Postpartum depression requires treatment because it can last several months or longer if it is not treated. Treatment may include individual or group therapy, medicine, or both to address any social, physiological, and psychological factors  that may play a role in the depression. Regular exercise, a healthy diet, rest, and social support may also be strongly recommended.  °Postpartum psychosis is more serious and needs treatment right away. Hospitalization is often needed. °HOME CARE INSTRUCTIONS °· Get as much rest as you can. Nap when the baby sleeps.   °· Exercise regularly. Some women find yoga and walking to be beneficial.   °· Eat a balanced and nourishing diet.   °· Do little things that you enjoy. Have a cup of tea, take a bubble bath, read your favorite magazine, or listen to your favorite music. °· Avoid alcohol.   °· Ask for help with household chores, cooking, grocery shopping, or running errands as needed. Do not try to do everything.   °· Talk to people close to you about how you are feeling. Get support from your partner, family members, friends, or other new moms. °· Try to stay positive in how you think. Think about the things you are grateful for.   °· Do not spend a lot of time alone.   °· Only take over-the-counter or prescription medicine as directed by your health care provider. °· Keep all your postpartum appointments.   °· Let your health care provider know if you have any concerns.   °SEEK MEDICAL CARE IF: °You are having a reaction to or problems with your medicine. °SEEK IMMEDIATE MEDICAL CARE IF: °· You have suicidal feelings.   °· You think you may harm the baby or someone else. °MAKE SURE YOU: °· Understand these instructions. °· Will watch your condition. °· Will get help right away if you are not doing well or get worse. °  °This information is not intended to replace advice given to you by your health care provider. Make sure you discuss any questions you have with your health care provider. °  °Document Released: 03/19/2004 Document Revised: 06/20/2013 Document Reviewed: 03/27/2013 °Elsevier Interactive Patient Education ©2016 [employer] °Postpartum Care After Cesarean Delivery °After you deliver your newborn  (postpartum period), the usual stay in the hospital is 24-72 hours. If there were problems with your labor or delivery, or if you have other medical problems, you might be in the hospital longer.  °While you are in the hospital, you will receive help and instructions on how to care for yourself and your newborn during the postpartum period.  °While you are in the hospital: °· It is normal for you to have pain or discomfort from the incision in your   abdomen. Be sure to tell your nurses when you are having pain, where the pain is located, and what makes the pain worse. °· If you are breastfeeding, you may feel uncomfortable contractions of your uterus for a couple of weeks. This is normal. The contractions help your uterus get back to normal size. °· It is normal to have some bleeding after delivery. °· For the first 1-3 days after delivery, the flow is red and the amount may be similar to a period. °· It is common for the flow to start and stop. °· In the first few days, you may pass some small clots. Let your nurses know if you begin to pass large clots or your flow increases. °· Do not  flush blood clots down the toilet before having the nurse look at them. °· During the next 3-10 days after delivery, your flow should become more watery and pink or brown-tinged in color. °· Ten to fourteen days after delivery, your flow should be a small amount of yellowish-white discharge. °· The amount of your flow will decrease over the first few weeks after delivery. Your flow may stop in 6-8 weeks. Most women have had their flow stop by 12 weeks after delivery. °· You should change your sanitary pads frequently. °· Wash your hands thoroughly with soap and water for at least 20 seconds after changing pads, using the toilet, or before holding or feeding your newborn. °· Your intravenous (IV) tubing will be removed when you are drinking enough fluids. °· The urine drainage tube (urinary catheter) that was inserted before delivery  may be removed within 6-8 hours after delivery or when feeling returns to your legs. You should feel like you need to empty your bladder within the first 6-8 hours after the catheter has been removed. °· In case you become weak, lightheaded, or faint, call your nurse before you get out of bed for the first time and before you take a shower for the first time. °· Within the first few days after delivery, your breasts may begin to feel tender and full. This is called engorgement. Breast tenderness usually goes away within 48-72 hours after engorgement occurs. You may also notice milk leaking from your breasts. If you are not breastfeeding, do not stimulate your breasts. Breast stimulation can make your breasts produce more milk. °· Spending as much time as possible with your newborn is very important. During this time, you and your newborn can feel close and get to know each other. Having your newborn stay in your room (rooming in) will help to strengthen the bond with your newborn. It will give you time to get to know your newborn and become comfortable caring for your newborn. °· Your hormones change after delivery. Sometimes the hormone changes can temporarily cause you to feel sad or tearful. These feelings should not last more than a few days. If these feelings last longer than that, you should talk to your caregiver. °· If desired, talk to your caregiver about methods of family planning or contraception. °· Talk to your caregiver about immunizations. Your caregiver may want you to have the following immunizations before leaving the hospital: °· Tetanus, diphtheria, and pertussis (Tdap) or tetanus and diphtheria (Td) immunization. It is very important that you and your family (including grandparents) or others caring for your newborn are up-to-date with the Tdap or Td immunizations. The Tdap or Td immunization can help protect your newborn from getting ill. °· Rubella immunization. °· Varicella (chickenpox)    immunization. °· Influenza immunization. You should receive this annual immunization if you did not receive the immunization during your pregnancy. °  °This information is not intended to replace advice given to you by your health care provider. Make sure you discuss any questions you have with your health care provider. °  °Document Released: 03/09/2012 Document Reviewed: 03/09/2012 °[employer]vier Interactive Patient Education ©2016 [employer] °Breastfeeding and Mastitis °Mastitis is inflammation of the breast tissue. It can occur in women who are breastfeeding. This can make breastfeeding painful. Mastitis will sometimes go away on its own. Your health care provider will help determine if treatment is needed. °CAUSES °Mastitis is often associated with a blocked milk (lactiferous) duct. This can happen when too much milk builds up in the breast. Causes of excess milk in the breast can include: °· Poor latch-on. If your baby is not latched onto the breast properly, she or he may not empty your breast completely while breastfeeding. °· Allowing too much time to pass between feedings. °· Wearing a bra or other clothing that is too tight. This puts extra pressure on the lactiferous ducts so milk does not flow through them as it should. °Mastitis can also be caused by a bacterial infection. Bacteria may enter the breast tissue through cuts or openings in the skin. In women who are breastfeeding, this may occur because of cracked or irritated skin. Cracks in the skin are often caused when your baby does not latch on properly to the breast. °SIGNS AND SYMPTOMS °· Swelling, redness, tenderness, and pain in an area of the breast. °· Swelling of the glands under the arm on the same side. °· Fever may or may not accompany mastitis. °If an infection is allowed to progress, a collection of pus (abscess) may develop. °DIAGNOSIS  °Your health care provider can usually diagnose mastitis based on your symptoms and a physical exam.  Tests may be done to help confirm the diagnosis. These may include: °· Removal of pus from the breast by applying pressure to the area. This pus can be examined in the lab to determine which bacteria are present. If an abscess has developed, the fluid in the abscess can be removed with a needle. This can also be used to confirm the diagnosis and determine the bacteria present. In most cases, pus will not be present. °· Blood tests to determine if your body is fighting a bacterial infection. °· Mammogram or ultrasound tests to rule out other problems or diseases. °TREATMENT  °Mastitis that occurs with breastfeeding will sometimes go away on its own. Your health care provider may choose to wait 24 hours after first seeing you to decide whether a prescription medicine is needed. If your symptoms are worse after 24 hours, your health care provider will likely prescribe an antibiotic medicine to treat the mastitis. He or she will determine which bacteria are most likely causing the infection and will then select an appropriate antibiotic medicine. This is sometimes changed based on the results of tests performed to identify the bacteria, or if there is no response to the antibiotic medicine selected. Antibiotic medicines are usually given by mouth. You may also be given medicine for pain. °HOME CARE INSTRUCTIONS °· Only take over-the-counter or prescription medicines for pain, fever, or discomfort as directed by your health care provider. °· If your health care provider prescribed an antibiotic medicine, take the medicine as directed. Make sure you finish it even if you start to feel better. °· Do not wear a   tight or underwire bra. Wear a soft, supportive bra. °· Increase your fluid intake, especially if you have a fever. °· Continue to empty the breast. Your health care provider can tell you whether this milk is safe for your infant or needs to be thrown out. You may be told to stop nursing until your health care  provider thinks it is safe for your baby. Use a breast pump if you are advised to stop nursing. °· Keep your nipples clean and dry. °· Empty the first breast completely before going to the other breast. If your baby is not emptying your breasts completely for some reason, use a breast pump to empty your breasts. °· If you go back to work, pump your breasts while at work to stay in time with your nursing schedule. °· Avoid allowing your breasts to become overly filled with milk (engorged). °SEEK MEDICAL CARE IF: °· You have pus-like discharge from the breast. °· Your symptoms do not improve with the treatment prescribed by your health care provider within 2 days. °SEEK IMMEDIATE MEDICAL CARE IF: °· Your pain and swelling are getting worse. °· You have pain that is not controlled with medicine. °· You have a red line extending from the breast toward your armpit. °· You have a fever or persistent symptoms for more than 2-3 days. °· You have a fever and your symptoms suddenly get worse. °MAKE SURE YOU:  °· Understand these instructions. °· Will watch your condition. °· Will get help right away if you are not doing well or get worse. °  °This information is not intended to replace advice given to you by your health care provider. Make sure you discuss any questions you have with your health care provider. °  °Document Released: 10/10/2004 Document Revised: 06/20/2013 Document Reviewed: 01/19/2013 °[employer]vier Interactive Patient Education ©2016 [employer] °Breastfeeding °Deciding to breastfeed is one of the best choices you can make for you and your baby. A change in hormones during pregnancy causes your breast tissue to grow and increases the number and size of your milk ducts. These hormones also allow proteins, sugars, and fats from your blood supply to make breast milk in your milk-producing glands. Hormones prevent breast milk from being released before your baby is born as well as prompt milk flow after birth. Once  breastfeeding has begun, thoughts of your baby, as well as his or her sucking or crying, can stimulate the release of milk from your milk-producing glands.  °BENEFITS OF BREASTFEEDING °For Your Baby °· Your first milk (colostrum) helps your baby's digestive system function better. °· There are antibodies in your milk that help your baby fight off infections. °· Your baby has a lower incidence of asthma, allergies, and sudden infant death syndrome. °· The nutrients in breast milk are better for your baby than infant formulas and are designed uniquely for your baby's needs. °· Breast milk improves your baby's brain development. °· Your baby is less likely to develop other conditions, such as childhood obesity, asthma, or type 2 diabetes mellitus. °For You °· Breastfeeding helps to create a very special bond between you and your baby. °· Breastfeeding is convenient. Breast milk is always available at the correct temperature and costs nothing. °· Breastfeeding helps to burn calories and helps you lose the weight gained during pregnancy. °· Breastfeeding makes your uterus contract to its prepregnancy size faster and slows bleeding (lochia) after you give birth.   °· Breastfeeding helps to lower your risk of developing type   2 diabetes mellitus, osteoporosis, and breast or ovarian cancer later in life. °SIGNS THAT YOUR BABY IS HUNGRY °Early Signs of Hunger °· Increased alertness or activity. °· Stretching. °· Movement of the head from side to side. °· Movement of the head and opening of the mouth when the corner of the mouth or cheek is stroked (rooting). °· Increased sucking sounds, smacking lips, cooing, sighing, or squeaking. °· Hand-to-mouth movements. °· Increased sucking of fingers or hands. °Late Signs of Hunger °· Fussing. °· Intermittent crying. °Extreme Signs of Hunger °Signs of extreme hunger will require calming and consoling before your baby will be able to breastfeed successfully. Do not wait for the  following signs of extreme hunger to occur before you initiate breastfeeding: °· Restlessness. °· A loud, strong cry. °· Screaming. °BREASTFEEDING BASICS °Breastfeeding Initiation °· Find a comfortable place to sit or lie down, with your neck and back well supported. °· Place a pillow or rolled up blanket under your baby to bring him or her to the level of your breast (if you are seated). Nursing pillows are specially designed to help support your arms and your baby while you breastfeed. °· Make sure that your baby's abdomen is facing your abdomen. °· Gently massage your breast. With your fingertips, massage from your chest wall toward your nipple in a circular motion. This encourages milk flow. You may need to continue this action during the feeding if your milk flows slowly. °· Support your breast with 4 fingers underneath and your thumb above your nipple. Make sure your fingers are well away from your nipple and your baby's mouth. °· Stroke your baby's lips gently with your finger or nipple. °· When your baby's mouth is open wide enough, quickly bring your baby to your breast, placing your entire nipple and as much of the colored area around your nipple (areola) as possible into your baby's mouth. °· More areola should be visible above your baby's upper lip than below the lower lip. °· Your baby's tongue should be between his or her lower gum and your breast. °· Ensure that your baby's mouth is correctly positioned around your nipple (latched). Your baby's lips should create a seal on your breast and be turned out (everted). °· It is common for your baby to suck about 2-3 minutes in order to start the flow of breast milk. °Latching °Teaching your baby how to latch on to your breast properly is very important. An improper latch can cause nipple pain and decreased milk supply for you and poor weight gain in your baby. Also, if your baby is not latched onto your nipple properly, he or she may swallow some air during  feeding. This can make your baby fussy. Burping your baby when you switch breasts during the feeding can help to get rid of the air. However, teaching your baby to latch on properly is still the best way to prevent fussiness from swallowing air while breastfeeding. °Signs that your baby has successfully latched on to your nipple: °· Silent tugging or silent sucking, without causing you pain. °· Swallowing heard between every 3-4 sucks. °· Muscle movement above and in front of his or her ears while sucking. °Signs that your baby has not successfully latched on to nipple: °· Sucking sounds or smacking sounds from your baby while breastfeeding. °· Nipple pain. °If you think your baby has not latched on correctly, slip your finger into the corner of your baby's mouth to break the suction and place it   between your baby's gums. Attempt breastfeeding initiation again. °Signs of Successful Breastfeeding °Signs from your baby: °· A gradual decrease in the number of sucks or complete cessation of sucking. °· Falling asleep. °· Relaxation of his or her body. °· Retention of a small amount of milk in his or her mouth. °· Letting go of your breast by himself or herself. °Signs from you: °· Breasts that have increased in firmness, weight, and size 1-3 hours after feeding. °· Breasts that are softer immediately after breastfeeding. °· Increased milk volume, as well as a change in milk consistency and color by the fifth day of breastfeeding. °· Nipples that are not sore, cracked, or bleeding. °Signs That Your Baby is Getting Enough Milk °· Wetting at least 3 diapers in a 24-hour period. The urine should be clear and pale yellow by age 5 days. °· At least 3 stools in a 24-hour period by age 5 days. The stool should be soft and yellow. °· At least 3 stools in a 24-hour period by age 7 days. The stool should be seedy and yellow. °· No loss of weight greater than 10% of birth weight during the first 3 days of age. °· Average weight  gain of 4-7 ounces (113-198 g) per week after age 4 days. °· Consistent daily weight gain by age 5 days, without weight loss after the age of 2 weeks. °After a feeding, your baby may spit up a small amount. This is common. °BREASTFEEDING FREQUENCY AND DURATION °Frequent feeding will help you make more milk and can prevent sore nipples and breast engorgement. Breastfeed when you feel the need to reduce the fullness of your breasts or when your baby shows signs of hunger. This is called "breastfeeding on demand." Avoid introducing a pacifier to your baby while you are working to establish breastfeeding (the first 4-6 weeks after your baby is born). After this time you may choose to use a pacifier. Research has shown that pacifier use during the first year of a baby's life decreases the risk of sudden infant death syndrome (SIDS). °Allow your baby to feed on each breast as long as he or she wants. Breastfeed until your baby is finished feeding. When your baby unlatches or falls asleep while feeding from the first breast, offer the second breast. Because newborns are often sleepy in the first few weeks of life, you may need to awaken your baby to get him or her to feed. °Breastfeeding times will vary from baby to baby. However, the following rules can serve as a guide to help you ensure that your baby is properly fed: °· Newborns (babies 4 weeks of age or younger) may breastfeed every 1-3 hours. °· Newborns should not go longer than 3 hours during the day or 5 hours during the night without breastfeeding. °· You should breastfeed your baby a minimum of 8 times in a 24-hour period until you begin to introduce solid foods to your baby at around 6 months of age. °BREAST MILK PUMPING °Pumping and storing breast milk allows you to ensure that your baby is exclusively fed your breast milk, even at times when you are unable to breastfeed. This is especially important if you are going back to work while you are still  breastfeeding or when you are not able to be present during feedings. Your lactation consultant can give you guidelines on how long it is safe to store breast milk. °A breast pump is a machine that allows you to pump milk   from your breast into a sterile bottle. The pumped breast milk can then be stored in a refrigerator or freezer. Some breast pumps are operated by hand, while others use electricity. Ask your lactation consultant which type will work best for you. Breast pumps can be purchased, but some hospitals and breastfeeding support groups lease breast pumps on a monthly basis. A lactation consultant can teach you how to hand express breast milk, if you prefer not to use a pump. °CARING FOR YOUR BREASTS WHILE YOU BREASTFEED °Nipples can become dry, cracked, and sore while breastfeeding. The following recommendations can help keep your breasts moisturized and healthy: °· Avoid using soap on your nipples. °· Wear a supportive bra. Although not required, special nursing bras and tank tops are designed to allow access to your breasts for breastfeeding without taking off your entire bra or top. Avoid wearing underwire-style bras or extremely tight bras. °· Air dry your nipples for 3-4 minutes after each feeding. °· Use only cotton bra pads to absorb leaked breast milk. Leaking of breast milk between feedings is normal. °· Use lanolin on your nipples after breastfeeding. Lanolin helps to maintain your skin's normal moisture barrier. If you use pure lanolin, you do not need to wash it off before feeding your baby again. Pure lanolin is not toxic to your baby. You may also hand express a few drops of breast milk and gently massage that milk into your nipples and allow the milk to air dry. °In the first few weeks after giving birth, some women experience extremely full breasts (engorgement). Engorgement can make your breasts feel heavy, warm, and tender to the touch. Engorgement peaks within 3-5 days after you give  birth. The following recommendations can help ease engorgement: °· Completely empty your breasts while breastfeeding or pumping. You may want to start by applying warm, moist heat (in the shower or with warm water-soaked hand towels) just before feeding or pumping. This increases circulation and helps the milk flow. If your baby does not completely empty your breasts while breastfeeding, pump any extra milk after he or she is finished. °· Wear a snug bra (nursing or regular) or tank top for 1-2 days to signal your body to slightly decrease milk production. °· Apply ice packs to your breasts, unless this is too uncomfortable for you. °· Make sure that your baby is latched on and positioned properly while breastfeeding. °If engorgement persists after 48 hours of following these recommendations, contact your health care provider or a lactation consultant. °OVERALL HEALTH CARE RECOMMENDATIONS WHILE BREASTFEEDING °· Eat healthy foods. Alternate between meals and snacks, eating 3 of each per day. Because what you eat affects your breast milk, some of the foods may make your baby more irritable than usual. Avoid eating these foods if you are sure that they are negatively affecting your baby. °· Drink milk, fruit juice, and water to satisfy your thirst (about 10 glasses a day). °· Rest often, relax, and continue to take your prenatal vitamins to prevent fatigue, stress, and anemia. °· Continue breast self-awareness checks. °· Avoid chewing and smoking tobacco. Chemicals from cigarettes that pass into breast milk and exposure to secondhand smoke may harm your baby. °· Avoid alcohol and drug use, including marijuana. °Some medicines that may be harmful to your baby can pass through breast milk. It is important to ask your health care provider before taking any medicine, including all over-the-counter and prescription medicine as well as vitamin and herbal supplements. °It is possible to become   pregnant while breastfeeding. If  birth control is desired, ask your health care provider about options that will be safe for your baby. °SEEK MEDICAL CARE IF: °· You feel like you want to stop breastfeeding or have become frustrated with breastfeeding. °· You have painful breasts or nipples. °· Your nipples are cracked or bleeding. °· Your breasts are red, tender, or warm. °· You have a swollen area on either breast. °· You have a fever or chills. °· You have nausea or vomiting. °· You have drainage other than breast milk from your nipples. °· Your breasts do not become full before feedings by the fifth day after you give birth. °· You feel sad and depressed. °· Your baby is too sleepy to eat well. °· Your baby is having trouble sleeping.   °· Your baby is wetting less than 3 diapers in a 24-hour period. °· Your baby has less than 3 stools in a 24-hour period. °· Your baby's skin or the white part of his or her eyes becomes yellow.   °· Your baby is not gaining weight by 5 days of age. °SEEK IMMEDIATE MEDICAL CARE IF: °· Your baby is overly tired (lethargic) and does not want to wake up and feed. °· Your baby develops an unexplained fever. °  °This information is not intended to replace advice given to you by your health care provider. Make sure you discuss any questions you have with your health care provider. °  °Document Released: 06/15/2005 Document Revised: 03/06/2015 Document Reviewed: 12/07/2012 °Elsevier Interactive Patient Education ©2016 [employer] ° °

## 2016-03-30 NOTE — Discharge Summary (Signed)
OB Discharge Summary     Patient Name: Suzanne Wilcox DOB: 01-May-1976 MRN: [id number]  Date of admission: 03/27/2016 Delivering MD: Olivia Mackie   Date of discharge: 03/30/2016  Admitting diagnosis: Previous Cesarean Section x 2, Desires Sterilization Intrauterine pregnancy: [redacted]w[redacted]d     Secondary diagnosis:  Principal Problem:   Postpartum care following cesarean delivery (9/29) Active Problems:   Delivered by cesarean section   Postoperative retention of urine  Additional problems: Postoperative Urinary Retention     Discharge diagnosis: Term Pregnancy Delivered, CHTN and Bipolar / Depression                                                                                                Post partum procedures:none  Augmentation: n/a  Complications: None  Hospital course:  Sceduled C/S   40 y.o. yo [id number] at [redacted]w[redacted]d was admitted to the hospital 03/27/2016 for scheduled cesarean section with the following indication:Elective Repeat and Bilateral Tubal Ligation.  Membrane Rupture Time/Date: 12:36 PM ,03/27/2016   Patient delivered a Viable infant.03/27/2016  Details of operation can be found in separate operative note.  Pateint had an mildly complicated postpartum course with postoperative urinary retention that resolved immediately after Lasix 20 mg x 1 dose. She is ambulating, tolerating a regular diet, passing flatus, and urinating well. Patient is discharged home in stable condition on  03/30/16          Physical exam Vitals:   03/29/16 1815 03/29/16 2216 03/30/16 0004 03/30/16 0509  BP: 140/76 (!) 156/86 (!) 149/81 138/75  Pulse:  (!) 104 (!) 105 95  Resp:    20  Temp:    98.4 F (36.9 C)  TempSrc:    Oral  SpO2:       General: alert, cooperative and no distress Lochia: appropriate Uterine Fundus: firm - difficult to palpate d/t maternal habitus Incision: Healing well with no significant drainage, No significant erythema, Dressing is clean, dry, and intact,  skin well-approximated with sutures DVT Evaluation: No evidence of DVT seen on physical exam. Negative Homan's sign. No cords or calf tenderness. 2+ Calf/Ankle edema is present  Labs: Lab Results  Component Value Date   WBC 14.3 (H) 03/28/2016   HGB 10.1 (L) 03/28/2016   HCT 29.6 (L) 03/28/2016   MCV 85.8 03/28/2016   PLT 218 03/28/2016   CMP Latest Ref Rng & Units 03/28/2016  Glucose 65 - 99 mg/dL 478(G)  BUN 6 - 20 mg/dL 14  Creatinine 9.56 - 2.13 mg/dL 0.86  Sodium 578 - 469 mmol/L 136  Potassium 3.5 - 5.1 mmol/L 3.8  Chloride 101 - 111 mmol/L 106  CO2 22 - 32 mmol/L 24  Calcium 8.9 - 10.3 mg/dL 6.2(X)  Total Protein 6.5 - 8.1 g/dL -  Total Bilirubin 0.3 - 1.2 mg/dL -  Alkaline Phos 38 - 528 U/L -  AST 15 - 41 U/L -  ALT 14 - 54 U/L -    Discharge instruction: per After Visit Summary and "Baby and Me Booklet".  After visit meds:    Medication List    TAKE these medications  acetaminophen 500 MG tablet Commonly known as:  TYLENOL Take 1,000 mg by mouth every 6 (six) hours as needed for mild pain or headache.   buPROPion 150 MG 24 hr tablet Commonly known as:  WELLBUTRIN XL Take 300 mg by mouth daily.   calcium carbonate 500 MG chewable tablet Commonly known as:  TUMS - dosed in mg elemental calcium Chew 2 tablets by mouth daily as needed for heartburn.   CHOLINE PO Take 1 tablet by mouth daily.   DHA PO Take 2 capsules by mouth daily.   gabapentin 100 MG capsule Commonly known as:  NEURONTIN Take 100 mg by mouth 2 (two) times daily.   hydrochlorothiazide 25 MG tablet Commonly known as:  HYDRODIURIL Take 1 tablet (25 mg total) by mouth daily.   ibuprofen 800 MG tablet Commonly known as:  ADVIL,MOTRIN Take 1 tablet (800 mg total) by mouth every 8 (eight) hours as needed for mild pain.   labetalol 100 MG tablet Commonly known as:  NORMODYNE Take 100 mg by mouth 3 (three) times daily.   oxyCODONE-acetaminophen 5-325 MG tablet Commonly known as:   ROXICET Take 1-2 tablets by mouth every 4 (four) hours as needed for severe pain.   prenatal multivitamin Tabs tablet Take 1 tablet by mouth daily at 12 noon.   QUEtiapine 200 MG tablet Commonly known as:  SEROQUEL Take 200 mg by mouth at bedtime.   ranitidine 150 MG tablet Commonly known as:  ZANTAC Take 150 mg by mouth 3 (three) times daily.       Diet: routine diet  Activity: Advance as tolerated. Pelvic rest for 6 weeks.   Outpatient follow up:6 weeks Follow up Appt:No future appointments. Follow up Visit:No Follow-up on file.  Postpartum contraception: Tubal Ligation  Newborn Data: Live born female on 03/27/2016 Birth Weight: 7 lb 3.2 oz (3265 g) APGAR: 9, 9  Baby Feeding: Bottle and Breast Disposition:home with mother   03/30/2016 Raelyn MoraAWSON, Yarelin Reichardt, Judie PetitM, CNM

## 2016-03-31 LAB — TYPE AND SCREEN
ABO/RH(D): O POS
Antibody Screen: NEGATIVE
UNIT DIVISION: 0
UNIT DIVISION: 0

## 2016-04-03 DIAGNOSIS — O163 Unspecified maternal hypertension, third trimester: Secondary | ICD-10-CM | POA: Diagnosis not present

## 2016-04-03 DIAGNOSIS — O169 Unspecified maternal hypertension, unspecified trimester: Secondary | ICD-10-CM | POA: Diagnosis not present

## 2016-04-21 DIAGNOSIS — F3173 Bipolar disorder, in partial remission, most recent episode manic: Secondary | ICD-10-CM | POA: Diagnosis not present

## 2016-04-21 DIAGNOSIS — F3174 Bipolar disorder, in full remission, most recent episode manic: Secondary | ICD-10-CM | POA: Diagnosis not present

## 2016-04-30 ENCOUNTER — Encounter (HOSPITAL_COMMUNITY)
Admission: RE | Admit: 2016-04-30 | Discharge: 2016-04-30 | Disposition: A | Discharge status: Home / Self Care | Payer: BLUE CROSS/BLUE SHIELD | Source: Ambulatory Visit | Attending: Obstetrics and Gynecology | Admitting: Obstetrics and Gynecology

## 2016-05-04 DIAGNOSIS — M549 Dorsalgia, unspecified: Secondary | ICD-10-CM | POA: Diagnosis not present

## 2016-05-04 DIAGNOSIS — K219 Gastro-esophageal reflux disease without esophagitis: Secondary | ICD-10-CM | POA: Diagnosis not present

## 2016-05-04 DIAGNOSIS — F419 Anxiety disorder, unspecified: Secondary | ICD-10-CM | POA: Diagnosis not present

## 2016-05-04 DIAGNOSIS — I10 Essential (primary) hypertension: Secondary | ICD-10-CM | POA: Diagnosis not present

## 2016-05-06 ENCOUNTER — Ambulatory Visit (INDEPENDENT_AMBULATORY_CARE_PROVIDER_SITE_OTHER): Payer: BLUE CROSS/BLUE SHIELD

## 2016-05-06 ENCOUNTER — Ambulatory Visit (INDEPENDENT_AMBULATORY_CARE_PROVIDER_SITE_OTHER): Payer: Self-pay

## 2016-05-06 ENCOUNTER — Encounter (INDEPENDENT_AMBULATORY_CARE_PROVIDER_SITE_OTHER): Payer: Self-pay | Admitting: Sports Medicine

## 2016-05-06 ENCOUNTER — Ambulatory Visit (INDEPENDENT_AMBULATORY_CARE_PROVIDER_SITE_OTHER): Payer: BLUE CROSS/BLUE SHIELD | Admitting: Sports Medicine

## 2016-05-06 VITALS — BP 136/86 | HR 84 | Ht 68.0 in | Wt 392.0 lb

## 2016-05-06 DIAGNOSIS — M25562 Pain in left knee: Secondary | ICD-10-CM

## 2016-05-06 DIAGNOSIS — M5441 Lumbago with sciatica, right side: Secondary | ICD-10-CM

## 2016-05-06 DIAGNOSIS — M25561 Pain in right knee: Secondary | ICD-10-CM

## 2016-05-06 DIAGNOSIS — G8929 Other chronic pain: Secondary | ICD-10-CM | POA: Diagnosis not present

## 2016-05-06 DIAGNOSIS — M5442 Lumbago with sciatica, left side: Secondary | ICD-10-CM | POA: Diagnosis not present

## 2016-05-06 MED ORDER — GABAPENTIN 300 MG PO CAPS: ORAL_CAPSULE | ORAL | 1 refills | Status: DC

## 2016-05-06 NOTE — Progress Notes (Signed)
Suzanne Wilcox - 40 y.o. female MRN [id number]  Date of birth: 07/17/1975  Office Visit Note: Visit Date: 05/06/2016 PCP: Astrid DivineGRIFFIN,ELAINE COLLINS, MD Referred by: Maurice SmallGriffin, Elaine, MD  Subjective: Chief Complaint  Patient presents with  . Left Knee - Pain  . Right Knee - Pain   HPI: Patient states bilateral knee pain and back pain.  Bilateral knee pain for about a week in a half.  Hurt to walk and bend and going from sitting to standing.  No swelling. Lower back is chronic pain, upper back pain for 3-4 weeks.  Upper back pain stays in back.  Patient is 6 weeks out from a cesarean section. Her activity level has significantly decreased. She has not been allowed to resume normal lifting activities due to the incision. The incision is well-healed per patient report. She denies any fevers, chills recent night sweats. She's had significant disordered sleep due to her newborn. Her left upper extremity pain has significantly improved this seems to be different than prior episodes of back pain or neck pain.  She is not taking any specific medications for this but was previously taking pain medications as well as Tylenol. These did seem to mildly relieve her symptoms. Symptoms currently are mild. She denies any significant mood alterations and reports doing well at home.    ROS Otherwise per HPI.  Assessment & Plan: Visit Diagnoses:  1. Chronic bilateral low back pain with bilateral sciatica   2. Chronic pain of both knees     Plan: Findings:  Back & knee pain most likely reflects underlying deconditioning secondary to recent cesarean delivery. Underlying disordered sleep is also likely contributing. She would like to continue with conservative measures & is amenable to restarting gabapentin she was previously on to see if this is beneficial. Also like her to check with her obstetrician regarding starting core conditioning program once this is appropriate. We'll plan follow-up with her if any  lack of improvement would like to see her begin working on the above exercises.    Meds & Orders:  Meds ordered this encounter  Medications  . gabapentin (NEURONTIN) 300 MG capsule    Sig: Start with 1 tab po qhs X 1 week, then increase to 1 tab po bid X 1 week then 1 tab po prn    Dispense:  90 capsule    Refill:  1    Orders Placed This Encounter  Procedures  . XR Lumbar Spine 2-3 Views  . XR Knee 1-2 Views Right  . XR Knee 1-2 Views Left    Follow-up: No Follow-up on file.   Procedures: No procedures performed  No notes on file   Clinical History: No specialty comments available.  She reports that she quit smoking about 7 years ago. Her smoking use included Cigarettes. She has a 36.00 pack-year smoking history. She has never used smokeless tobacco. No results for input(s): HGBA1C, LABURIC in the last 8760 hours.  Objective:  VS:  HT:5\' 8"  (172.7 cm)   WT:(!) 392 lb (177.8 kg)  BMI:59.7    BP:136/86  HR:84bpm  TEMP: ( )  RESP:  Physical Exam  Constitutional:  Adult morbidly obese female. No acute distress. Alert & appropriate. Good insight. Denies any depressive symptoms.   Bilateral lower extremities are overall well aligned although quite large. She has good internal & external rotation of bilateral hips. Her knee has full unrestricted range of motion. She has a small amount of pain with straight leg raise bilaterally  this does radiate to the posterior aspect of the leg. Lower extremity sensation is intact. She is able to heel & toe walk without difficulty. Abdominal incision is well-healed.    Ortho Exam Imaging: Xr Knee 1-2 Views Left  Result Date: 05/11/2016 2V x-ray bilateral knees: Knees are overall well aligned. She has a mild amount of subchondral sclerosis along the medial joint line of the left knee. Patellofemoral joint is intact. Right knee shows mild soap chondral sclerosis bilaterally with a small amount of joint space loss within the medial compartment.  Impression: Mild degenerative changes of bilateral knees.  Xr Knee 1-2 Views Right  Result Date: 05/11/2016 2V x-ray bilateral knees: Knees are overall well aligned. She has a mild amount of subchondral sclerosis along the medial joint line of the left knee. Patellofemoral joint is intact. Right knee shows mild soap chondral sclerosis bilaterally with a small amount of joint space loss within the medial compartment. Impression: Mild degenerative changes of bilateral knees.  Xr Lumbar Spine 2-3 Views  Result Date: 05/11/2016 2V lumbar spine: Findings: Well aligned back. No significant scoliosis. Well-maintained lumbar lordosis. Slight degenerative change at multiple levels including L5-S1 L4/L5. No acute fracture or dislocation appreciated. Impression: Mild age advanced degenerative changes most notably at L4-5 & L5-S1    Past Medical/Family/Surgical/Social History: Medications & Allergies reviewed per EMR Patient Active Problem List   Diagnosis Date Noted  . Postoperative retention of urine 03/28/2016  . Postpartum care following cesarean delivery (9/29) 03/27/2016  . Delivered by cesarean section 03/27/2016  . Breech birth 05/22/2014  . Bipolar I disorder, most recent episode (or current) depressed, severe, without mention of psychotic behavior 05/11/2012  . OCD (obsessive compulsive disorder) 05/11/2012  . Substance abuse 05/11/2012   Past Medical History:  Diagnosis Date  . Anxiety   . Bipolar disorder (HCC)   . Bipolar disorder (HCC)   . Depression   . GERD (gastroesophageal reflux disease)   . Hx of varicella   . Hypertension   . Missed abortions    X 6  . Obesity 04/29/2012  . Postoperative retention of urine 03/28/2016  . Postpartum care following cesarean delivery (9/29) 03/27/2016  . Termination of pregnancy (fetus) 03/22/2004   X 1  . Vaginal Pap smear, abnormal    Family History  Problem Relation Age of Onset  . Depression Mother   . Skin cancer Mother   .  Depression Paternal Grandmother   . Pancreatic cancer Father   . Kidney Stones Father   . Stroke Father   . Dementia Father   . Skin cancer Sister    Past Surgical History:  Procedure Laterality Date  . CESAREAN SECTION  2011   X 1 [facility] [place]  . CESAREAN SECTION N/A 05/22/2014   Procedure: Repeat CESAREAN SECTION;  Surgeon: Lenoard Aden, MD;  Location: [facility];  Service: Obstetrics;  Laterality: N/A;  EDD: 05/31/14  . CESAREAN SECTION WITH BILATERAL TUBAL LIGATION Bilateral 03/27/2016   Procedure: Repeat CESAREAN SECTION WITH BILATERAL TUBAL LIGATION;  Surgeon: Olivia Mackie, MD;  Location: [facility];  Service: Obstetrics;  Laterality: Bilateral;  EDD: 04/01/16  . DILATION AND CURETTAGE OF UTERUS   2005, 2010     x2 [facility]  . IR GENERIC HISTORICAL  03/26/2016   IR FLUORO GUIDE CV LINE RIGHT 03/26/2016 [facility]  . IR GENERIC HISTORICAL  03/26/2016   IR US GUIDE VASC ACCESS RIGHT 03/26/2016 [facility]  . WISDOM TOOTH EXTRACTION  Social History   Occupational History  . Not on file.   Social History Main Topics  . Smoking status: Former Smoker    Packs/day: 2.00    Years: 18.00    Types: Cigarettes    Quit date: 04/29/2009  . Smokeless tobacco: Never Used  . Alcohol use No  . Drug use: No  . Sexual activity: Yes    Birth control/ protection: None

## 2016-05-08 ENCOUNTER — Ambulatory Visit (INDEPENDENT_AMBULATORY_CARE_PROVIDER_SITE_OTHER): Payer: Self-pay | Admitting: Sports Medicine

## 2016-05-08 DIAGNOSIS — Z1151 Encounter for screening for human papillomavirus (HPV): Secondary | ICD-10-CM | POA: Diagnosis not present

## 2016-06-16 ENCOUNTER — Ambulatory Visit (INDEPENDENT_AMBULATORY_CARE_PROVIDER_SITE_OTHER): Payer: BLUE CROSS/BLUE SHIELD | Admitting: Sports Medicine

## 2016-06-16 ENCOUNTER — Encounter (INDEPENDENT_AMBULATORY_CARE_PROVIDER_SITE_OTHER): Payer: Self-pay | Admitting: Sports Medicine

## 2016-06-16 VITALS — BP 142/95 | HR 98 | Ht 68.0 in | Wt 392.0 lb

## 2016-06-16 DIAGNOSIS — M25562 Pain in left knee: Secondary | ICD-10-CM | POA: Diagnosis not present

## 2016-06-16 DIAGNOSIS — M25561 Pain in right knee: Secondary | ICD-10-CM | POA: Diagnosis not present

## 2016-06-16 DIAGNOSIS — M5441 Lumbago with sciatica, right side: Secondary | ICD-10-CM

## 2016-06-16 DIAGNOSIS — G8929 Other chronic pain: Secondary | ICD-10-CM | POA: Diagnosis not present

## 2016-06-16 DIAGNOSIS — M5442 Lumbago with sciatica, left side: Secondary | ICD-10-CM

## 2016-06-16 NOTE — Progress Notes (Signed)
Suzanne Wilcox - 40 y.o. female MRN [id number]  Date of birth: 01/19/1976  Office Visit Note: Visit Date: 06/16/2016 PCP: Astrid DivineGRIFFIN,ELAINE COLLINS, MD Referred by: Maurice SmallGriffin, Elaine, MD  Subjective: Chief Complaint  Patient presents with  . Lower Back - Follow-up  . Right Knee - Follow-up  . Left Knee - Follow-up  . Follow-up    Patient states back is not doing good.  Pain in back stays in the bottom and the center.  States pain in knees is the same, no change.   HPI: Patient continues to report significant knee pain and back pain.  She denies any recent falls but is having progressive symptoms.  She has been cleared by her obstetrician for unrestricted activity but has been unable to return to these activities due to the significant back and leg pain.  She denies any changes in bowel or bladder.  She reports her mood is doing well and reports that going from 3-4 children is somewhat easy compared to 2-3. ROS: Pt denies any change in bowel or bladder habits, muscle weakness, numbness or falls associated with this pain. Otherwise per HPI.   Clinical History: No specialty comments available.  She reports that she quit smoking about 7 years ago. Her smoking use included Cigarettes. She has a 36.00 pack-year smoking history. She has never used smokeless tobacco.  No results for input(s): HGBA1C, LABURIC in the last 8760 hours.  Assessment & Plan: Visit Diagnoses: No diagnosis found.  Plan: Will trial alternative anti-inflammatory with Celebrex.  Additionally MRI of the lumbar spine can be considered and she will call to have this set up if any lack of improvement with formal home exercise program.  We did discuss referral to physical therapy and she would like to try formal exercises prior to doing this due to time commitments.  indicated for further evaluation.  I will plan to have her follow up with one of my partners once this is obtained for follow-up in my new location. Follow-up: No  Follow-up on file.  Meds:  Meds ordered this encounter  Medications  . celecoxib (CELEBREX) 100 MG capsule    Sig: Take 1 capsule (100 mg total) by mouth 2 (two) times daily.    Dispense:  60 capsule    Refill:  2   Procedures: No notes on file   Objective:  VS:  HT:5\' 8"  (172.7 cm)   WT:(!) 392 lb (177.8 kg)  BMI:59.7    BP:(!) 142/95  HR:98bpm  TEMP: ( )  RESP:  Physical Exam:  Adult female.  No acute distress.  Alert and appropriate.  Morbidly obese.  Pain straight leg raise bilaterally.  She has only minimal pain in the popliteal compression test.  She has no knee effusion.  Knees are ligamentously stable.  She has good internal and external rotation of bilateral hips.  Imaging: No results found.  Past Medical/Family/Surgical/Social History: Medications & Allergies reviewed per EMR Patient Active Problem List   Diagnosis Date Noted  . Postoperative retention of urine 03/28/2016  . Postpartum care following cesarean delivery (9/29) 03/27/2016  . Delivered by cesarean section 03/27/2016  . Breech birth 05/22/2014  . Bipolar I disorder, most recent episode (or current) depressed, severe, without mention of psychotic behavior 05/11/2012  . OCD (obsessive compulsive disorder) 05/11/2012  . Substance abuse 05/11/2012   Past Medical History:  Diagnosis Date  . Anxiety   . Bipolar disorder (HCC)   . Bipolar disorder (HCC)   . Depression   .  GERD (gastroesophageal reflux disease)   . Hx of varicella   . Hypertension   . Missed abortions    X 6  . Obesity 04/29/2012  . Postoperative retention of urine 03/28/2016  . Postpartum care following cesarean delivery (9/29) 03/27/2016  . Termination of pregnancy (fetus) 03/22/2004   X 1  . Vaginal Pap smear, abnormal    Family History  Problem Relation Age of Onset  . Depression Mother   . Skin cancer Mother   . Depression Paternal Grandmother   . Pancreatic cancer Father   . Kidney Stones Father   . Stroke Father   .  Dementia Father   . Skin cancer Sister    Past Surgical History:  Procedure Laterality Date  . CESAREAN SECTION  2011   X 1 [facility] - [facility]  . CESAREAN SECTION N/A 05/22/2014   Procedure: Repeat CESAREAN SECTION;  Surgeon: Lenoard Adenichard J Taavon, MD;  Location: [facility];  Service: Obstetrics;  Laterality: N/A;  EDD: 05/31/14  . CESAREAN SECTION WITH BILATERAL TUBAL LIGATION Bilateral 03/27/2016   Procedure: Repeat CESAREAN SECTION WITH BILATERAL TUBAL LIGATION;  Surgeon: Olivia Mackieichard Taavon, MD;  [facility];  Service: Obstetrics;  Laterality: Bilateral;  EDD: 04/01/16  . DILATION AND CURETTAGE OF UTERUS   2005, 2010     x2 MAB  . IR GENERIC HISTORICAL  03/26/2016   IR FLUORO GUIDE CV LINE RIGHT 03/26/2016 [facility]  . IR GENERIC HISTORICAL  03/26/2016   IR US GUIDE VASC ACCESS RIGHT 03/26/2016 [facility]  . WISDOM TOOTH EXTRACTION     Social History   Occupational History  . Not on file.   Social History Main Topics  . Smoking status: Former Smoker    Packs/day: 2.00    Years: 18.00    Types: Cigarettes    Quit date: 04/29/2009  . Smokeless tobacco: Never Used  . Alcohol use No  . Drug use: No  . Sexual activity: Yes    Birth control/ protection: None

## 2016-06-17 MED ORDER — CELECOXIB 100 MG PO CAPS: 100.0000 mg | ORAL_CAPSULE | Freq: Two times a day (BID) | ORAL | 2 refills | Status: DC

## 2016-08-18 DIAGNOSIS — R079 Chest pain, unspecified: Secondary | ICD-10-CM | POA: Diagnosis not present

## 2016-08-18 DIAGNOSIS — K219 Gastro-esophageal reflux disease without esophagitis: Secondary | ICD-10-CM | POA: Diagnosis not present

## 2016-09-11 DIAGNOSIS — J0100 Acute maxillary sinusitis, unspecified: Secondary | ICD-10-CM | POA: Diagnosis not present

## 2016-10-07 DIAGNOSIS — J0100 Acute maxillary sinusitis, unspecified: Secondary | ICD-10-CM | POA: Diagnosis not present

## 2016-10-08 ENCOUNTER — Other Ambulatory Visit (INDEPENDENT_AMBULATORY_CARE_PROVIDER_SITE_OTHER): Payer: Self-pay | Admitting: Sports Medicine

## 2016-10-15 DIAGNOSIS — F3176 Bipolar disorder, in full remission, most recent episode depressed: Secondary | ICD-10-CM | POA: Diagnosis not present

## 2016-10-15 DIAGNOSIS — F3174 Bipolar disorder, in full remission, most recent episode manic: Secondary | ICD-10-CM | POA: Diagnosis not present

## 2016-11-15 ENCOUNTER — Ambulatory Visit (HOSPITAL_COMMUNITY)
Admission: EM | Admit: 2016-11-15 | Discharge: 2016-11-15 | Disposition: A | Discharge status: Home / Self Care | Payer: BLUE CROSS/BLUE SHIELD | Attending: Internal Medicine | Admitting: Internal Medicine

## 2016-11-15 ENCOUNTER — Encounter (HOSPITAL_COMMUNITY): Payer: Self-pay | Admitting: *Deleted

## 2016-11-15 ENCOUNTER — Other Ambulatory Visit: Payer: Self-pay

## 2016-11-15 ENCOUNTER — Other Ambulatory Visit (INDEPENDENT_AMBULATORY_CARE_PROVIDER_SITE_OTHER): Payer: Self-pay | Admitting: Sports Medicine

## 2016-11-15 DIAGNOSIS — K219 Gastro-esophageal reflux disease without esophagitis: Secondary | ICD-10-CM | POA: Diagnosis not present

## 2016-11-15 DIAGNOSIS — R079 Chest pain, unspecified: Secondary | ICD-10-CM

## 2016-11-15 MED ORDER — RANITIDINE HCL 300 MG PO TABS: 300.0000 mg | ORAL_TABLET | Freq: Every day | ORAL | 2 refills | Status: DC

## 2016-11-15 NOTE — ED Provider Notes (Signed)
CSN: [id number]     Arrival date & time 11/15/16  1615 History   First MD Initiated Contact with Patient 11/15/16 1724     Chief Complaint  Patient presents with  . Chest Pain   (Consider location/radiation/quality/duration/timing/severity/associated sxs/prior Treatment) 41 year old female presents to clinic with a chief complaint of substernal chest tightness and pressure. States this is been ongoing problem for the last 2 years, previously worked up for and diagnosed with acid reflux, and with anxiety.   The history is provided by the patient.  Chest Pain  Pain location:  Substernal area and epigastric Pain quality: burning and tightness   Pain radiates to:  Mid back Pain severity:  Mild Onset quality:  Gradual Duration:  1 day Timing:  Intermittent Progression:  Unchanged Chronicity:  Recurrent Context: not breathing, not eating, not movement, not stress and not trauma   Context comment:  Worse with lying down Relieved by:  Certain positions Worsened by:  Certain positions Associated symptoms: heartburn   Associated symptoms: no abdominal pain, no altered mental status, no anorexia, no cough, no dizziness, no dysphagia, no headache, no lower extremity edema, no nausea, no near-syncope, no numbness, no orthopnea, no palpitations, no shortness of breath, no vomiting and no weakness   Risk factors: hypertension and obesity   Risk factors: no birth control and no immobilization     Past Medical History:  Diagnosis Date  . Anxiety   . Bipolar disorder (HCC)   . Bipolar disorder (HCC)   . Depression   . GERD (gastroesophageal reflux disease)   . Hx of varicella   . Hypertension   . Missed abortions    X 6  . Obesity 04/29/2012  . Postoperative retention of urine 03/28/2016  . Postpartum care following cesarean delivery (9/29) 03/27/2016  . Termination of pregnancy (fetus) 03/22/2004   X 1  . Vaginal Pap smear, abnormal    Past Surgical History:  Procedure Laterality Date   . CESAREAN SECTION  2011   X 1 [facility]  . CESAREAN SECTION N/A 05/22/2014   Procedure: Repeat CESAREAN SECTION;  Surgeon: Lenoard Adenichard J Taavon, MD;  Location: [facility];  Service: Obstetrics;  Laterality: N/A;  EDD: 05/31/14  . CESAREAN SECTION WITH BILATERAL TUBAL LIGATION Bilateral 03/27/2016   Procedure: Repeat CESAREAN SECTION WITH BILATERAL TUBAL LIGATION;  Surgeon: Olivia Mackieichard Taavon, MD;  [facility];  Service: Obstetrics;  Laterality: Bilateral;  EDD: 04/01/16  . DILATION AND CURETTAGE OF UTERUS   2005, 2010     x2 MAB  . IR GENERIC HISTORICAL  03/26/2016   IR FLUORO GUIDE CV LINE RIGHT 03/26/2016 [facility]  . IR GENERIC HISTORICAL  03/26/2016   IR US GUIDE VASC ACCESS RIGHT 03/26/2016 [facility]  . WISDOM TOOTH EXTRACTION     Family History  Problem Relation Age of Onset  . Depression Mother   . Skin cancer Mother   . Depression Paternal Grandmother   . Pancreatic cancer Father   . Kidney Stones Father   . Stroke Father   . Dementia Father   . Skin cancer Sister    Social History  Substance Use Topics  . Smoking status: Former Smoker    Packs/day: 2.00    Years: 18.00    Types: Cigarettes    Quit date: 04/29/2009  . Smokeless tobacco: Never Used  . Alcohol use No   OB History    Gravida Para Term Preterm AB Living   10 3 3  0 7 3  SAB TAB Ectopic Multiple Live Births   6 1 0 0 3     Review of Systems  Constitutional: Negative.   HENT: Negative.  Negative for trouble swallowing.   Respiratory: Negative for cough and shortness of breath.   Cardiovascular: Positive for chest pain. Negative for palpitations, orthopnea and near-syncope.  Gastrointestinal: Positive for heartburn. Negative for abdominal pain, anorexia, nausea and vomiting.  Musculoskeletal: Negative.   Skin: Negative.   Neurological: Negative for dizziness, weakness, numbness and headaches.    Allergies  Adhesive [tape]  Home Medications   Prior to Admission medications    Medication Sig Start Date End Date Taking? Authorizing Provider  ALPRAZolam (XANAX PO) Take by mouth.   Yes [provider]  amphetamine-dextroamphetamine (ADDERALL) 30 MG tablet Take 30 mg by mouth daily.   Yes [provider]  benazepril (LOTENSIN) 20 MG tablet  04/28/16  Yes [provider]  buPROPion (WELLBUTRIN XL) 150 MG 24 hr tablet Take 450 mg by mouth daily.    Yes [provider]  celecoxib (CELEBREX) 100 MG capsule TAKE 1 CAPSULE (100 MG TOTAL) BY MOUTH 2 (TWO) TIMES DAILY. 10/08/16  Yes Gaspar Bidding D, DO  PANTOPRAZOLE SODIUM PO Take 40 mg by mouth.   Yes [provider]  QUEtiapine (SEROQUEL) 200 MG tablet Take 200 mg by mouth at bedtime.    Yes [provider]  traMADol Janean Sark) 50 MG tablet  05/04/16  Yes [provider]  ranitidine (ZANTAC) 300 MG tablet Take 1 tablet (300 mg total) by mouth at bedtime. 11/15/16   Dorena Bodo, NP   Meds Ordered and Administered this Visit  Medications - No data to display  BP (!) 130/58   Pulse 88   Temp 98.5 F (36.9 C) (Oral)   Resp 18   LMP 11/01/2016 (Exact Date)   SpO2 100%   Breastfeeding? No  No data found.   Physical Exam  Constitutional: She is oriented to person, place, and time. She appears well-developed and well-nourished. No distress.  HENT:  Head: Normocephalic.  Right Ear: External ear normal.  Left Ear: External ear normal.  Eyes: Conjunctivae are normal.  Neck: Normal range of motion.  Cardiovascular: Normal rate, regular rhythm and intact distal pulses.   Pulmonary/Chest: Effort normal and breath sounds normal.  Abdominal: Soft. Bowel sounds are normal.  Musculoskeletal: She exhibits no edema.  Neurological: She is alert and oriented to person, place, and time.  Skin: Skin is warm and dry. Capillary refill takes less than 2 seconds. She is not diaphoretic.  Psychiatric: She has a normal mood and affect. Her behavior is normal.  Nursing note  and vitals reviewed.   Urgent Care Course     .EKG Date/Time: 11/15/2016 5:46 PM Performed by: Dorena Bodo Authorized by: Eustace Moore   ECG reviewed by ED Physician in the absence of a cardiologist: no   Previous ECG:    Previous ECG:  Unavailable Interpretation:    Interpretation: normal   Rate:    ECG rate:  84   ECG rate assessment: normal   Rhythm:    Rhythm: sinus rhythm   Ectopy:    Ectopy: none   QRS:    QRS axis:  Normal Conduction:    Conduction: normal   ST segments:    ST segments:  Normal T waves:    T waves: normal   Comments:     PR 142 ms QRS 82 ms QT/QTc 382/451 ms   (including critical  care time)  Labs Review Labs Reviewed - No data to display  Imaging Review No results found.     MDM   1. Gastroesophageal reflux disease, esophagitis presence not specified    Symptoms most likely not cardiac related based on signs, symptoms, and history. More likely to be reflux related. Started on Zantac, continue the pantoprazole prescribed, follow-up with primary care, as a referral to GI  My be needed    Dorena Bodo, NP 11/15/16 [phone]

## 2016-11-15 NOTE — ED Triage Notes (Signed)
Reports intermittent substernal chest tightness over past yr; no cardiology f/u - had EKG done @ PCP and was treated for GERD and panic attacks.  This episode chest tightness onset last night; radiates through to her back at times; constant since onset.  No associated with SOB, diaphoresis, dizziness or nausea.  Pain is non-reproduceable.

## 2016-11-15 NOTE — Discharge Instructions (Signed)
Your signs and symptoms do not appear to be cardiac related, they are more consistent with reflux. Continue the pantoprazole as prescribed by your regular doctor, and I have added Zantac as well take 1 tablet every night at bedtime. Recommend he follow up with her primary care provider as you may need referral to GI

## 2016-11-16 NOTE — Telephone Encounter (Signed)
Patient needs to schedule f/u before next refill.

## 2016-12-14 ENCOUNTER — Other Ambulatory Visit: Payer: Self-pay | Admitting: Gastroenterology

## 2016-12-14 DIAGNOSIS — R1013 Epigastric pain: Secondary | ICD-10-CM | POA: Diagnosis not present

## 2016-12-18 DIAGNOSIS — R1013 Epigastric pain: Secondary | ICD-10-CM | POA: Diagnosis not present

## 2016-12-23 ENCOUNTER — Other Ambulatory Visit (INDEPENDENT_AMBULATORY_CARE_PROVIDER_SITE_OTHER): Payer: Self-pay | Admitting: Sports Medicine

## 2016-12-24 ENCOUNTER — Ambulatory Visit
Admission: RE | Admit: 2016-12-24 | Discharge: 2016-12-24 | Disposition: A | Discharge status: Home / Self Care | Payer: BLUE CROSS/BLUE SHIELD | Source: Ambulatory Visit | Attending: Gastroenterology | Admitting: Gastroenterology

## 2016-12-24 DIAGNOSIS — K8020 Calculus of gallbladder without cholecystitis without obstruction: Secondary | ICD-10-CM | POA: Diagnosis not present

## 2016-12-24 DIAGNOSIS — R1013 Epigastric pain: Secondary | ICD-10-CM

## 2016-12-27 DIAGNOSIS — K760 Fatty (change of) liver, not elsewhere classified: Secondary | ICD-10-CM | POA: Insufficient documentation

## 2017-01-06 ENCOUNTER — Encounter (HOSPITAL_COMMUNITY): Payer: Self-pay | Admitting: *Deleted

## 2017-01-11 ENCOUNTER — Ambulatory Visit (HOSPITAL_COMMUNITY)
Admission: RE | Admit: 2017-01-11 | Discharge: 2017-01-11 | Disposition: A | Discharge status: Home / Self Care | Payer: BLUE CROSS/BLUE SHIELD | Source: Ambulatory Visit | Attending: Gastroenterology | Admitting: Gastroenterology

## 2017-01-11 ENCOUNTER — Encounter (HOSPITAL_COMMUNITY)
Admission: RE | Disposition: A | Discharge status: Home / Self Care | Payer: Self-pay | Source: Ambulatory Visit | Attending: Gastroenterology

## 2017-01-11 ENCOUNTER — Encounter (HOSPITAL_COMMUNITY): Payer: Self-pay | Admitting: *Deleted

## 2017-01-11 ENCOUNTER — Ambulatory Visit (HOSPITAL_COMMUNITY): Payer: BLUE CROSS/BLUE SHIELD | Admitting: Anesthesiology

## 2017-01-11 DIAGNOSIS — K219 Gastro-esophageal reflux disease without esophagitis: Secondary | ICD-10-CM | POA: Diagnosis not present

## 2017-01-11 DIAGNOSIS — R1310 Dysphagia, unspecified: Secondary | ICD-10-CM | POA: Insufficient documentation

## 2017-01-11 DIAGNOSIS — K2970 Gastritis, unspecified, without bleeding: Secondary | ICD-10-CM | POA: Diagnosis not present

## 2017-01-11 DIAGNOSIS — I10 Essential (primary) hypertension: Secondary | ICD-10-CM | POA: Insufficient documentation

## 2017-01-11 DIAGNOSIS — F419 Anxiety disorder, unspecified: Secondary | ICD-10-CM | POA: Diagnosis not present

## 2017-01-11 DIAGNOSIS — G8929 Other chronic pain: Secondary | ICD-10-CM | POA: Insufficient documentation

## 2017-01-11 DIAGNOSIS — F329 Major depressive disorder, single episode, unspecified: Secondary | ICD-10-CM | POA: Diagnosis not present

## 2017-01-11 DIAGNOSIS — Z6841 Body Mass Index (BMI) 40.0 and over, adult: Secondary | ICD-10-CM | POA: Diagnosis not present

## 2017-01-11 DIAGNOSIS — K3189 Other diseases of stomach and duodenum: Secondary | ICD-10-CM | POA: Insufficient documentation

## 2017-01-11 DIAGNOSIS — E669 Obesity, unspecified: Secondary | ICD-10-CM | POA: Diagnosis not present

## 2017-01-11 DIAGNOSIS — F319 Bipolar disorder, unspecified: Secondary | ICD-10-CM | POA: Insufficient documentation

## 2017-01-11 DIAGNOSIS — Z87891 Personal history of nicotine dependence: Secondary | ICD-10-CM | POA: Diagnosis not present

## 2017-01-11 DIAGNOSIS — M549 Dorsalgia, unspecified: Secondary | ICD-10-CM | POA: Diagnosis not present

## 2017-01-11 DIAGNOSIS — R1013 Epigastric pain: Secondary | ICD-10-CM | POA: Diagnosis not present

## 2017-01-11 HISTORY — PX: ESOPHAGOGASTRODUODENOSCOPY: SHX5428

## 2017-01-11 SURGERY — EGD (ESOPHAGOGASTRODUODENOSCOPY)
Anesthesia: Monitor Anesthesia Care

## 2017-01-11 MED ORDER — LACTATED RINGERS IV SOLN
INTRAVENOUS | Status: DC
  Administered 2017-01-11: 10:00:00 via INTRAVENOUS

## 2017-01-11 MED ORDER — PROPOFOL 500 MG/50ML IV EMUL
INTRAVENOUS | Status: DC | PRN
  Administered 2017-01-11: 300 ug/kg/min via INTRAVENOUS

## 2017-01-11 MED ORDER — PROPOFOL 10 MG/ML IV BOLUS
INTRAVENOUS | Status: AC
  Filled 2017-01-11: qty 40

## 2017-01-11 MED ORDER — SODIUM CHLORIDE 0.9 % IV SOLN: INTRAVENOUS | Status: DC

## 2017-01-11 NOTE — Anesthesia Procedure Notes (Signed)
Procedure Name: MAC Date/Time: 01/11/2017 11:27 AM Performed by: Lissa Morales Pre-anesthesia Checklist: Patient identified, Emergency Drugs available, Suction available and Patient being monitored Patient Re-evaluated:Patient Re-evaluated prior to induction Oxygen Delivery Method: Nasal cannula Number of attempts: 1 Airway Equipment and Method: Oral airway Placement Confirmation: positive ETCO2 Dental Injury: Teeth and Oropharynx as per pre-operative assessment

## 2017-01-11 NOTE — Transfer of Care (Signed)
Immediate Anesthesia Transfer of Care Note  Patient: Suzanne Wilcox  Procedure(s) Performed: Procedure(s): ESOPHAGOGASTRODUODENOSCOPY (EGD) (N/A)  Patient Location: PACU  Anesthesia Type:MAC  Level of Consciousness: awake, alert , oriented and patient cooperative  Airway & Oxygen Therapy: Patient Spontanous Breathing and Patient connected to nasal cannula oxygen  Post-op Assessment: Report given to RN, Post -op Vital signs reviewed and stable and Patient moving all extremities X 4  Post vital signs: stable  Last Vitals:  Vitals:   01/11/17 0933  BP: 135/82  Pulse: 91  Resp: (!) 22  Temp: 36.5 C    Last Pain:  Vitals:   01/11/17 0933  TempSrc: Oral         Complications: No apparent anesthesia complications

## 2017-01-11 NOTE — Discharge Instructions (Signed)

## 2017-01-11 NOTE — H&P (Signed)
His review H&P performed today pre-Procedure

## 2017-01-11 NOTE — Op Note (Signed)
EGD showed a normal-appearing esophagus, biopsies were taken to rule out eosinophilic esophagitis. Z line appeared normal at 40 cm. Mild erythema was noted in the antrum, biopsies were taken to rule out H. Pylori. Duodenum appeared unremarkable, biopsies were taken to rule out celiac disease.

## 2017-01-11 NOTE — Op Note (Signed)
[facility] Patient Name: Suzanne Wilcox Procedure Date: 01/11/2017 MRN: [id number] Attending MD: Kerin Salen , MD Date of Birth: 1976-01-08 CSN: [id number] Age: 41 Admit Type: Outpatient Procedure:                Upper GI endoscopy Indications:              Epigastric abdominal pain, Dysphagia Providers:                Kerin Salen, MD, Carin Hock, RN, Margo Aye,                            Technician Referring MD:              Medicines:                Monitored Anesthesia Care Complications:            No immediate complications. Estimated Blood Loss:     Estimated blood loss: none. Procedure:                Pre-Anesthesia Assessment:                           - Prior to the procedure, a History and Physical                            was performed, and patient medications and                            allergies were reviewed. The patient's tolerance of                            previous anesthesia was also reviewed. The risks                            and benefits of the procedure and the sedation                            options and risks were discussed with the patient.                            All questions were answered, and informed consent                            was obtained. Prior Anticoagulants: The patient has                            taken no previous anticoagulant or antiplatelet                            agents. ASA Grade Assessment: III - A patient with                            severe systemic disease. After reviewing the risks  and benefits, the patient was deemed in                            satisfactory condition to undergo the procedure.                           After obtaining informed consent, the endoscope was                            passed under direct vision. Throughout the                            procedure, the patient's blood pressure, pulse, and                            oxygen  saturations were monitored continuously. The                            [id number] ([id number] scope was introduced through the                            mouth, and advanced to the second part of duodenum.                            The upper GI endoscopy was accomplished without                            difficulty. The patient tolerated the procedure                            well. Scope In: Scope Out: Findings:      No endoscopic abnormality was evident in the esophagus to explain the       patient's complaint of dysphagia. Biopsies were obtained from the       proximal and distal esophagus with cold forceps for histology of       suspected eosinophilic esophagitis.      The Z-line was regular and was found 40 cm from the incisors. There was       no appreciable endoluminal narrowing/ring/stricture noted for dilatation.      Localized mildly erythematous mucosa without bleeding was found in the       gastric antrum. Biopsies were taken with a cold forceps for Helicobacter       pylori testing.      The cardia and gastric fundus were normal on retroflexion.      The examined duodenum was normal. Biopsies for histology were taken with       a cold forceps for evaluation of celiac disease. Impression:               - No endoscopic esophageal abnormality to explain                            patient's dysphagia. Biopsied.                           - Z-line regular, 40 cm from  the incisors.                           - Erythematous mucosa in the antrum. Biopsied.                           - Normal examined duodenum. Biopsied. Moderate Sedation:      Patient did not receive moderate sedation for this procedure, but       instead received monitored anesthesia care. Recommendation:           - Patient has a contact number available for                            emergencies. The signs and symptoms of potential                            delayed complications were discussed with the                             patient. Return to normal activities tomorrow.                            Written discharge instructions were provided to the                            patient.                           - Resume regular diet.                           - Continue present medications.                           - Await pathology results.                           - Return to my office as previously scheduled. Procedure Code(s):        --- Professional ---                           [phone], Esophagogastroduodenoscopy, flexible,                            transoral; with biopsy, single or multiple Diagnosis Code(s):        --- Professional ---                           R13.10, Dysphagia, unspecified                           K31.89, Other diseases of stomach and duodenum                           R10.13, Epigastric pain CPT copyright 2016 American Medical Association. All rights reserved. The codes documented in this report are preliminary and upon coder review may  be revised to meet current compliance requirements. Kerin Salen, MD 01/11/2017 11:44:38 AM This report has been signed electronically. Number of Addenda: 0

## 2017-01-11 NOTE — Anesthesia Preprocedure Evaluation (Signed)
Anesthesia Evaluation  Patient identified by MRN, date of birth, ID band Patient awake    Reviewed: Allergy & Precautions, NPO status , Patient's Chart, lab work & pertinent test results  Airway Mallampati: I  TM Distance: >3 FB Neck ROM: Full    Dental   Pulmonary former smoker,    Pulmonary exam normal        Cardiovascular hypertension, Pt. on medications Normal cardiovascular exam     Neuro/Psych Anxiety Depression Bipolar Disorder    GI/Hepatic GERD  Medicated and Controlled,  Endo/Other  Morbid obesity  Renal/GU      Musculoskeletal   Abdominal   Peds  Hematology   Anesthesia Other Findings   Reproductive/Obstetrics                             Anesthesia Physical Anesthesia Plan  ASA: III  Anesthesia Plan: MAC   Post-op Pain Management:    Induction: Intravenous  PONV Risk Score and Plan: 2 and Ondansetron  Airway Management Planned:   Additional Equipment:   Intra-op Plan:   Post-operative Plan:   Informed Consent: I have reviewed the patients History and Physical, chart, labs and discussed the procedure including the risks, benefits and alternatives for the proposed anesthesia with the patient or authorized representative who has indicated his/her understanding and acceptance.     Plan Discussed with: CRNA and Surgeon  Anesthesia Plan Comments:         Anesthesia Quick Evaluation

## 2017-01-11 NOTE — Brief Op Note (Signed)
01/11/2017  11:44 AM  PATIENT:  Suzanne Wilcox  41 y.o. female  PRE-OPERATIVE DIAGNOSIS:  Epigastric pain  POST-OPERATIVE DIAGNOSIS:  mild gastritis  PROCEDURE:  Procedure(s): ESOPHAGOGASTRODUODENOSCOPY (EGD) (N/A)  SURGEON:  Surgeon(s) and Role:    Ronnette Juniper, MD - Primary  PHYSICIAN ASSISTANT:   ASSISTANTS: none   ANESTHESIA:   MAC  EBL:  No intake/output data recorded.  BLOOD ADMINISTERED:none  DRAINS: none   LOCAL MEDICATIONS USED:  NONE  SPECIMEN:  Biopsy / Limited Resection  DISPOSITION OF SPECIMEN:  PATHOLOGY  COUNTS:  YES  TOURNIQUET:  * No tourniquets in log *  DICTATION: .Dragon Dictation  PLAN OF CARE: Discharge to home after PACU  PATIENT DISPOSITION:  PACU - hemodynamically stable.   Delay start of Pharmacological VTE agent (>24hrs) due to surgical blood loss or risk of bleeding: no

## 2017-01-11 NOTE — H&P (Signed)
This is a 41 year old Caucasian female, seen in the office on 12/14/16, for epigastric pain, lower sternal pain, described as pressure that radiates to the back and has been present intermittently for the past 2 years. She was given treatment for acid reflux and takes ranitidine 150 mg in the morning, 300 mg at bedtime along with Protonix in the evening which does not cause symptomatic improvement.  There is no history of unintentional weight loss, she did lose 70 pounds since September 2017 after giving birth but it was reported as intentional. She denies difficulty swallowing or pain on swallowing. Her bowel movements are regular and denies blood in stool or black stools. She takes an ex for anxiety. No history of smoking or use of alcohol. No history of colon cancer in the family.  Past medical history: Hypertension, GERD, chronic back pain, bipolar disorder, anxiety, depression, obesity,, dizziness, headaches, joint pain, history of difficulty swallowing.  Past surgical history: C-section 2011, 2015 D&C 2010 D and E 2005 C-section and tubal ligation 02/2016  Family history: No history of GI malignancy  Social history: Former smoker, quit 6 years ago No history of alcohol use  No known drug allergies  Review of systems:  positive for fatigue, hypertension, abdominal pain, bloating, constipation, heartburn, indigestion, arthritis  Assessment and plan: Persistent epigastric pain Intermittent difficulty swallowing  Diagnostic EGD with antral, small bowel, esophageal biopsies and esophageal dilatation if needed Patient's BMI is over 50.

## 2017-01-11 NOTE — Anesthesia Postprocedure Evaluation (Signed)
Anesthesia Post Note  Patient: Jaeliana A Gozick-Treto  Procedure(s) Performed: Procedure(s) (LRB): ESOPHAGOGASTRODUODENOSCOPY (EGD) (N/A)     Patient location during evaluation: PACU Anesthesia Type: MAC Level of consciousness: awake and alert Pain management: pain level controlled Vital Signs Assessment: post-procedure vital signs reviewed and stable Respiratory status: spontaneous breathing, nonlabored ventilation, respiratory function stable and patient connected to nasal cannula oxygen Cardiovascular status: stable and blood pressure returned to baseline Anesthetic complications: no    Last Vitals:  Vitals:   01/11/17 1153 01/11/17 1200  BP: 129/64 123/78  Pulse: 74 86  Resp: 20 14  Temp: 36.7 C     Last Pain:  Vitals:   01/11/17 1153  TempSrc: Oral                 Ariyon Mittleman DAVID

## 2017-01-12 ENCOUNTER — Encounter (HOSPITAL_COMMUNITY): Payer: Self-pay | Admitting: Gastroenterology

## 2017-01-22 DIAGNOSIS — I10 Essential (primary) hypertension: Secondary | ICD-10-CM | POA: Diagnosis not present

## 2017-01-22 DIAGNOSIS — Z0000 Encounter for general adult medical examination without abnormal findings: Secondary | ICD-10-CM | POA: Diagnosis not present

## 2017-02-04 ENCOUNTER — Other Ambulatory Visit: Payer: Self-pay | Admitting: Surgery

## 2017-02-04 DIAGNOSIS — K829 Disease of gallbladder, unspecified: Secondary | ICD-10-CM | POA: Diagnosis not present

## 2017-02-17 ENCOUNTER — Other Ambulatory Visit (INDEPENDENT_AMBULATORY_CARE_PROVIDER_SITE_OTHER): Payer: Self-pay | Admitting: Sports Medicine

## 2017-04-07 DIAGNOSIS — F3181 Bipolar II disorder: Secondary | ICD-10-CM | POA: Diagnosis not present

## 2017-04-07 DIAGNOSIS — F3174 Bipolar disorder, in full remission, most recent episode manic: Secondary | ICD-10-CM | POA: Diagnosis not present

## 2017-04-23 NOTE — Patient Instructions (Addendum)
Suzanne Wilcox  04/23/2017   Your procedure is scheduled on: 04-29-17  [facility] Main  Entrance [place] Elevators to 3rd floor to  Short Stay Center at 5:30 AM.   Call this number if you have problems the morning of surgery [phone]    Remember: ONLY 1 PERSON MAY GO WITH YOU TO SHORT STAY TO GET  READY MORNING OF YOUR SURGERY.  Do not eat food or drink liquids :After Midnight.     Take these medicines the morning of surgery with A SIP OF WATER: Bupropion (Wellbutrin -XL), and Adderall                                You may not have any metal on your body including hair pins and              piercings  Do not wear jewelry, make-up, lotions, powders or perfumes, deodorant             Do not wear nail polish.  Do not shave  48 hours prior to surgery.                Do not bring valuables to the hospital. [facility] IS NOT             RESPONSIBLE   FOR VALUABLES.  Contacts, dentures or bridgework may not be worn into surgery.     Patients discharged the day of surgery will not be allowed to drive home.  Name and phone number of your driver: Suzanne EveryRavon Wilcox [phone] Home [phone]                Please read over the following fact sheets you were given: _____________________________________________________________________             [facility] - Preparing for Surgery Before surgery, you can play an important role.  Because skin is not sterile, your skin needs to be as free of germs as possible.  You can reduce the number of germs on your skin by washing with CHG (chlorahexidine gluconate) soap before surgery.  CHG is an antiseptic cleaner which kills germs and bonds with the skin to continue killing germs even after washing. Please DO NOT use if you have an allergy to CHG or antibacterial soaps.  If your skin becomes reddened/irritated stop using the CHG and inform your nurse when you arrive at Short Stay. Do not shave (including  legs and underarms) for at least 48 hours prior to the first CHG shower.  You may shave your face/neck. Please follow these instructions carefully:  1.  Shower with CHG Soap the night before surgery and the  morning of Surgery.  2.  If you choose to wash your hair, wash your hair first as usual with your  normal  shampoo.  3.  After you shampoo, rinse your hair and body thoroughly to remove the  shampoo.                           4.  Use CHG as you would any other liquid soap.  You can apply chg directly  to the skin and wash                       Gently with  a scrungie or clean washcloth.  5.  Apply the CHG Soap to your body ONLY FROM THE NECK DOWN.   Do not use on face/ open                           Wound or open sores. Avoid contact with eyes, ears mouth and genitals (private parts).                       Wash face,  Genitals (private parts) with your normal soap.             6.  Wash thoroughly, paying special attention to the area where your surgery  will be performed.  7.  Thoroughly rinse your body with warm water from the neck down.  8.  DO NOT shower/wash with your normal soap after using and rinsing off  the CHG Soap.                9.  Pat yourself dry with a clean towel.            10.  Wear clean pajamas.            11.  Place clean sheets on your bed the night of your first shower and do not  sleep with pets. Day of Surgery : Do not apply any lotions/deodorants the morning of surgery.  Please wear clean clothes to the hospital/surgery center.  FAILURE TO FOLLOW THESE INSTRUCTIONS MAY RESULT IN THE CANCELLATION OF YOUR SURGERY PATIENT SIGNATURE_________________________________  NURSE SIGNATURE__________________________________  ________________________________________________________________________

## 2017-04-23 NOTE — Progress Notes (Signed)
11-16-16 (EPIC) EKG

## 2017-04-28 ENCOUNTER — Encounter (HOSPITAL_COMMUNITY)
Admission: RE | Admit: 2017-04-28 | Discharge: 2017-04-28 | Disposition: A | Discharge status: Home / Self Care | Payer: BLUE CROSS/BLUE SHIELD | Source: Ambulatory Visit | Attending: Surgery | Admitting: Surgery

## 2017-04-28 ENCOUNTER — Encounter (HOSPITAL_COMMUNITY): Payer: Self-pay | Admitting: *Deleted

## 2017-04-28 DIAGNOSIS — K829 Disease of gallbladder, unspecified: Secondary | ICD-10-CM | POA: Diagnosis not present

## 2017-04-28 DIAGNOSIS — Z79899 Other long term (current) drug therapy: Secondary | ICD-10-CM | POA: Diagnosis not present

## 2017-04-28 DIAGNOSIS — K8010 Calculus of gallbladder with chronic cholecystitis without obstruction: Secondary | ICD-10-CM | POA: Diagnosis not present

## 2017-04-28 DIAGNOSIS — K219 Gastro-esophageal reflux disease without esophagitis: Secondary | ICD-10-CM | POA: Diagnosis not present

## 2017-04-28 DIAGNOSIS — F319 Bipolar disorder, unspecified: Secondary | ICD-10-CM | POA: Diagnosis not present

## 2017-04-28 DIAGNOSIS — Z6841 Body Mass Index (BMI) 40.0 and over, adult: Secondary | ICD-10-CM | POA: Diagnosis not present

## 2017-04-28 DIAGNOSIS — F419 Anxiety disorder, unspecified: Secondary | ICD-10-CM | POA: Diagnosis not present

## 2017-04-28 DIAGNOSIS — Z87891 Personal history of nicotine dependence: Secondary | ICD-10-CM | POA: Diagnosis not present

## 2017-04-28 DIAGNOSIS — I10 Essential (primary) hypertension: Secondary | ICD-10-CM | POA: Diagnosis not present

## 2017-04-28 DIAGNOSIS — Z791 Long term (current) use of non-steroidal anti-inflammatories (NSAID): Secondary | ICD-10-CM | POA: Diagnosis not present

## 2017-04-28 LAB — CBC WITH DIFFERENTIAL/PLATELET
BASOS PCT: 0 %
Basophils Absolute: 0 10*3/uL (ref 0.0–0.1)
EOS ABS: 0.1 10*3/uL (ref 0.0–0.7)
Eosinophils Relative: 1 %
HEMATOCRIT: 40.5 % (ref 36.0–46.0)
HEMOGLOBIN: 13.4 g/dL (ref 12.0–15.0)
Lymphocytes Relative: 28 %
Lymphs Abs: 1.8 10*3/uL (ref 0.7–4.0)
MCH: 28.5 pg (ref 26.0–34.0)
MCHC: 33.1 g/dL (ref 30.0–36.0)
MCV: 86.2 fL (ref 78.0–100.0)
Monocytes Absolute: 0.3 10*3/uL (ref 0.1–1.0)
Monocytes Relative: 5 %
NEUTROS ABS: 4.1 10*3/uL (ref 1.7–7.7)
NEUTROS PCT: 66 %
Platelets: 310 10*3/uL (ref 150–400)
RBC: 4.7 MIL/uL (ref 3.87–5.11)
RDW: 13.5 % (ref 11.5–15.5)
WBC: 6.3 10*3/uL (ref 4.0–10.5)

## 2017-04-28 LAB — COMPREHENSIVE METABOLIC PANEL
ALBUMIN: 3.9 g/dL (ref 3.5–5.0)
ALK PHOS: 72 U/L (ref 38–126)
ALT: 16 U/L (ref 14–54)
AST: 16 U/L (ref 15–41)
Anion gap: 8 (ref 5–15)
BUN: 19 mg/dL (ref 6–20)
CALCIUM: 9 mg/dL (ref 8.9–10.3)
CO2: 26 mmol/L (ref 22–32)
CREATININE: 0.88 mg/dL (ref 0.44–1.00)
Chloride: 104 mmol/L (ref 101–111)
GFR calc Af Amer: >60 mL/min (ref >60)
GFR calc non Af Amer: >60 mL/min (ref >60)
GLUCOSE: 95 mg/dL (ref 65–99)
Potassium: 4.2 mmol/L (ref 3.5–5.1)
SODIUM: 138 mmol/L (ref 135–145)
Total Bilirubin: 0.6 mg/dL (ref 0.3–1.2)
Total Protein: 7.1 g/dL (ref 6.5–8.1)

## 2017-04-28 LAB — HCG, SERUM, QUALITATIVE: Preg, Serum: NEGATIVE

## 2017-04-28 MED ORDER — DEXTROSE 5 % IV SOLN
3.0000 g | INTRAVENOUS | Status: DC
  Filled 2017-04-28: qty 3000

## 2017-04-28 MED ORDER — DEXTROSE 5 % IV SOLN
3.0000 g | INTRAVENOUS | Status: AC
  Administered 2017-04-29: 3 g via INTRAVENOUS
  Filled 2017-04-28: qty 3

## 2017-04-28 NOTE — H&P (Signed)
Suzanne Wilcox  Location: [facility] Patient #: [id number] DOB: 1976/01/06 Single / Language: [place] / Race: White Female  History of Present Illness   The patient is a 41 year old female who presents with a complaint of gall bladder disease.  The PCP is Dr. Massie Maroon  The patient was referred by Dr. Kerin Salen  She comes by herself.  About 2 years ago she developed chest pain which appeared related to anxiety attacks. She took Xanax which controlled these pains. Then about 1 year ago, her chest pain was not is well-controlled with the Xanax his seem like something had changed. She was told initially that this was gastroesophageal reflux, so her meds were changed. But this did not help her pain. She was pregnant and delivered her third daughter in September 2017. During her pregnancy, the pain was better, she thinks she was eating better. The patient has a history of gastroesophageal reflux for which she takes ranitidine twice a day. She also takes pantoprazole in the evening. This is not caused any symptomatic improvement. She's also has weight fluctuations last couple years. She's had more trouble with epigastric/retrosternal pain and saw Dr. Kerin Salen.  She had an EGD by Dr. Kerin Salen on 11 January 2017 which was negative. She had an ultrasound on 16 December 2016 at [facility] imaging which showed multiple gallstones and increased echogenicity of her liver.  I discussed with the patient the indications and risks of gall bladder surgery. The primary risks of gall bladder surgery include, but are not limited to, bleeding, infection, common bile duct injury, and open surgery. There is also the risk that the patient may have continued symptoms after surgery. We discussed the typical post-operative recovery course. I tried to answer the patient's questions. I gave the patient literature about gall bladder surgery.  Plan:  1) there is some turnover at work where she feels she needs to be there at this time. She wants a look into October as her surgery. She knows that her symptoms worsen, she'll be in touch with our office before then. 2) She is also interested in having the surgery as an outpatient.  Past Medical History: 1. HTN 2. GERD 3. Bipolar 4. Anxiety/Depression - on Xanax 5. C section - 02/2016 - R. [place] 6. Morbid obesity - she weight 400 pounds during pregnancy, she has lost about 70 pounds. we talked some about weight loss surgery - but she had a friend who had a bypass - but her friend is anemic, struggling 7. Left knee with a bone spur  Social History: She has a partnership She has 3 daughters - 10 months, 2 1/2 yo, and 10 yo. She also has a 59 yo step son.  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 Benazepril HCl (20MG  Tablet, Oral) Active. Celecoxib (100MG  Capsule, Oral) Active. Gas-X (80MG  Tablet Chewable, Oral) Active. SEROquel (200MG  Tablet, Oral) Active. Wellbutrin SR (150MG  Tablet ER 12HR, Oral) Active. Aspirin-Acetaminophen (Oral) Specific strength unknown - Active. Ibuprofen (400MG  Tablet, Oral) Active. Medications Reconciled  Social History Christianne Dolin, [place]; 02/04/2017 2:51 PM) Alcohol use  Remotely quit alcohol use. Caffeine use  Carbonated beverages, Coffee, Tea. Illicit drug use  Remotely quit drug use. Tobacco use  Former  smoker.  Family History Christianne Dolin, [place]; 02/04/2017 2:51 PM) Alcohol Abuse  Sister. Anesthetic complications  Mother. Arthritis  Mother. Cerebrovascular Accident  Family Members In General, Father. Depression  Mother, Sister. Diabetes Mellitus  Mother. Heart Disease  Father. Hypertension  Mother. Malignant Neoplasm Of Pancreas  Father. Melanoma  Mother. Respiratory Condition  Father.  Pregnancy / Birth History Christianne Dolin, [place]; 02/04/2017 2:51 PM) Age at menarche  14 years. Gravida  36 Maternal age  87-30 Para  3 Regular periods   Other Problems Christianne Dolin, [place]; 02/04/2017 2:51 PM) Anxiety Disorder  Arthritis  Back Pain  Depression  Gastroesophageal Reflux Disease  Hemorrhoids  High blood pressure     Review of Systems Christianne Dolin RMA; 02/04/2017 2:51 PM) General Present- Fatigue. Not Present- Appetite Loss, Chills, Fever, Night Sweats, Weight Gain and Weight Loss. Skin Not Present- Change in Wart/Mole, Dryness, Hives, Jaundice, New Lesions, Non-Healing Wounds, Rash and Ulcer. HEENT Present- Seasonal Allergies and Wears glasses/contact lenses. Not Present- Earache, Hearing Loss, Hoarseness, Nose Bleed, Oral Ulcers, Ringing in the Ears, Sinus Pain, Sore Throat, Visual Disturbances and Yellow Eyes. Respiratory Present- Snoring. Not Present- Bloody sputum, Chronic Cough, Difficulty Breathing and Wheezing. Breast Not Present- Breast Mass, Breast Pain, Nipple Discharge and Skin Changes. Cardiovascular Present- Leg Cramps. Not Present- Chest Pain, Difficulty Breathing Lying Down, Palpitations, Rapid Heart Rate, Shortness of Breath and Swelling of Extremities. Gastrointestinal Present- Constipation and Hemorrhoids. Not Present- Abdominal Pain, Bloating, Bloody Stool, Change in Bowel Habits, Chronic diarrhea, Difficulty Swallowing, Excessive gas, Gets full quickly at meals, Indigestion, Nausea, Rectal Pain and Vomiting. Female Genitourinary  Not Present- Frequency, Nocturia, Painful Urination, Pelvic Pain and Urgency. Musculoskeletal Present- Back Pain, Joint Pain, Joint Stiffness and Muscle Pain. Not Present- Muscle Weakness and Swelling of Extremities. Neurological Present- Headaches. Not Present- Decreased Memory, Fainting, Numbness, Seizures, Tingling, Tremor, Trouble walking and Weakness. Psychiatric Present- Anxiety, Bipolar, Depression and Fearful. Not Present- Change in Sleep Pattern and Frequent crying. Endocrine Not Present- Cold Intolerance, Excessive Hunger, Hair Changes, Heat Intolerance, Hot flashes and New Diabetes. Hematology Not Present- Blood Thinners, Easy Bruising, Excessive bleeding, Gland problems, HIV and Persistent Infections.  Vitals Christianne Dolin RMA; 02/04/2017 2:58 PM) 02/04/2017 2:57 PM Weight: 335.2 lb Height: 68.5in Body Surface Area: 2.56 m Body Mass Index: 50.23 kg/m  Temp.: 98.60F  Pulse: 102 (Regular)  BP: 1510/110 (Sitting, Left Arm, Standard)    Physical Exam  General: Very obese WFalert and generally healthy appearing. Skin: Inspection and palpation of the skin unremarkable.  Eyes: Conjunctivae white, pupils equal. Face, ears, nose, mouth, and throat: Face - normal. Normal ears and nose. Lips and teeth normal.  Neck: Supple. No mass. Trachea midline. No thyroid mass.  Lymph Nodes: No supraclavicular or cervical adenopathy. No axillary adenopathy.  Lungs: Normal respiratory effort. Clear to auscultation and symmetric breath sounds. Cardiovascular: Regular rate and rythm. Normal auscultation of the heart. No murmur or rub.  Abdomen: Soft. No mass. Liver and spleen not palpable. No tenderness. No hernia. Normal bowel sounds.   She is about 1/2 pear and 1/2 apple. He abdomen is doughy. Rectal: Not done.  Musculoskeletal/extremities: Normal gait. Good strength and ROM in upper and lower extremities.   Neurologic: Grossly intact to motor and  sensory function.   Psychiatric: Has normal mood and affect.  Judgement and insight appear normal.  Assessment & Plan  1.  GALL BLADDER DISEASE (K82.9)  Plan:  1) Lap chole with IOC  2) Because of work issues, she wants to wait until October to do her surgery.  2.  MORBID OBESITY (E66.01)  3. HTN 4. GERD 5. Bipolar 6. Anxiety/Depression - on Xanax  Ovidio Kinavid Racquel Arkin, MD, [facility] Pager: [phone] Office phone:  [phone]

## 2017-04-29 ENCOUNTER — Encounter (HOSPITAL_COMMUNITY)
Admission: RE | Disposition: A | Discharge status: Home / Self Care | Payer: Self-pay | Source: Ambulatory Visit | Attending: Surgery

## 2017-04-29 ENCOUNTER — Ambulatory Visit (HOSPITAL_COMMUNITY): Payer: BLUE CROSS/BLUE SHIELD | Admitting: Certified Registered Nurse Anesthetist

## 2017-04-29 ENCOUNTER — Encounter (HOSPITAL_COMMUNITY): Payer: Self-pay | Admitting: Emergency Medicine

## 2017-04-29 ENCOUNTER — Ambulatory Visit (HOSPITAL_COMMUNITY)
Admission: RE | Admit: 2017-04-29 | Discharge: 2017-04-29 | Disposition: A | Discharge status: Home / Self Care | Payer: BLUE CROSS/BLUE SHIELD | Source: Ambulatory Visit | Attending: Surgery | Admitting: Surgery

## 2017-04-29 ENCOUNTER — Ambulatory Visit (HOSPITAL_COMMUNITY): Payer: BLUE CROSS/BLUE SHIELD

## 2017-04-29 DIAGNOSIS — F419 Anxiety disorder, unspecified: Secondary | ICD-10-CM | POA: Insufficient documentation

## 2017-04-29 DIAGNOSIS — Z6841 Body Mass Index (BMI) 40.0 and over, adult: Secondary | ICD-10-CM | POA: Diagnosis not present

## 2017-04-29 DIAGNOSIS — K219 Gastro-esophageal reflux disease without esophagitis: Secondary | ICD-10-CM | POA: Insufficient documentation

## 2017-04-29 DIAGNOSIS — K829 Disease of gallbladder, unspecified: Secondary | ICD-10-CM

## 2017-04-29 DIAGNOSIS — F319 Bipolar disorder, unspecified: Secondary | ICD-10-CM | POA: Insufficient documentation

## 2017-04-29 DIAGNOSIS — I10 Essential (primary) hypertension: Secondary | ICD-10-CM | POA: Insufficient documentation

## 2017-04-29 DIAGNOSIS — Z791 Long term (current) use of non-steroidal anti-inflammatories (NSAID): Secondary | ICD-10-CM | POA: Insufficient documentation

## 2017-04-29 DIAGNOSIS — Z87891 Personal history of nicotine dependence: Secondary | ICD-10-CM | POA: Diagnosis not present

## 2017-04-29 DIAGNOSIS — F429 Obsessive-compulsive disorder, unspecified: Secondary | ICD-10-CM | POA: Diagnosis not present

## 2017-04-29 DIAGNOSIS — Z79899 Other long term (current) drug therapy: Secondary | ICD-10-CM | POA: Diagnosis not present

## 2017-04-29 DIAGNOSIS — K8010 Calculus of gallbladder with chronic cholecystitis without obstruction: Secondary | ICD-10-CM | POA: Insufficient documentation

## 2017-04-29 HISTORY — PX: CHOLECYSTECTOMY: SHX55

## 2017-04-29 IMAGING — US IR FLUORO GUIDE CV LINE*R*
1 series · 1 of 1 positions shown · non-contrast
Comparison: none

INDICATION: Pregnant

[Series 1: ir fluoro/shunt/fist · 1 of 1 slices shown]
[im 1/1]
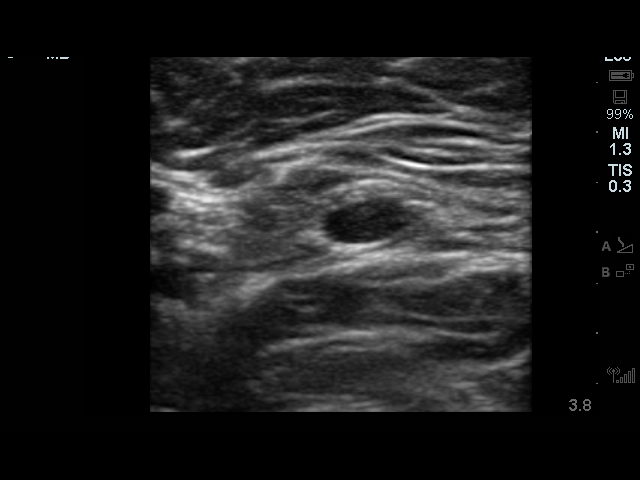

[1 of 1 positions shown; findings below may reference images not displayed]

EXAM:
RIGHT UPPER EXTREMITY PICC LINE PLACEMENT WITH ULTRASOUND AND
FLUOROSCOPIC GUIDANCE

MEDICATIONS:
None.

ANESTHESIA/SEDATION:
None.

FLUOROSCOPY TIME:  Fluoroscopy Time:  minutes 12 seconds (17 mGy).

COMPLICATIONS:
None immediate.

PROCEDURE:
The patient was advised of the possible risks and complications and
agreed to undergo the procedure. The patient was then brought to the
angiographic suite for the procedure.

The right arm was prepped with chlorhexidine, draped in the usual
sterile fashion using maximum barrier technique (cap and mask,
sterile gown, sterile gloves, large sterile sheet, hand hygiene and
cutaneous antiseptic). Local anesthesia was attained by infiltration
with 1% lidocaine.

Ultrasound demonstrated patency of the basilic vein, and this was
documented with an image. Under real-time ultrasound guidance, this
vein was accessed with a 21 gauge micropuncture needle and image
documentation was performed. The needle was exchanged over a
guidewire for a peel-away sheath through which a 48 cm 5 French
double lumen power injectable PICC was advanced, and positioned with
its tip at the lower SVC/right atrial junction. Fluoroscopy during
the procedure and fluoro spot radiograph confirms appropriate
catheter position. The catheter was flushed, secured to the skin
with Prolene sutures, and covered with a sterile dressing.
IMPRESSION: Successful placement of a right arm PICC with sonographic and
fluoroscopic guidance. The catheter is ready for use.

## 2017-04-29 SURGERY — LAPAROSCOPIC CHOLECYSTECTOMY WITH INTRAOPERATIVE CHOLANGIOGRAM
Anesthesia: General | Site: Abdomen

## 2017-04-29 MED ORDER — LIDOCAINE 2% (20 MG/ML) 5 ML SYRINGE
INTRAMUSCULAR | Status: DC | PRN
  Administered 2017-04-29: 1.5 mg/kg/h via INTRAVENOUS

## 2017-04-29 MED ORDER — MIDAZOLAM HCL 5 MG/5ML IJ SOLN
INTRAMUSCULAR | Status: DC | PRN
  Administered 2017-04-29 (×2): 1 mg via INTRAVENOUS

## 2017-04-29 MED ORDER — FENTANYL CITRATE (PF) 100 MCG/2ML IJ SOLN
INTRAMUSCULAR | Status: DC | PRN
  Administered 2017-04-29: 100 ug via INTRAVENOUS
  Administered 2017-04-29 (×2): 50 ug via INTRAVENOUS
  Administered 2017-04-29: 100 ug via INTRAVENOUS

## 2017-04-29 MED ORDER — LIDOCAINE HCL (CARDIAC) 20 MG/ML IV SOLN
INTRAVENOUS | Status: DC | PRN
  Administered 2017-04-29: 75 mg via INTRAVENOUS

## 2017-04-29 MED ORDER — FENTANYL CITRATE (PF) 100 MCG/2ML IJ SOLN
INTRAMUSCULAR | Status: AC
  Filled 2017-04-29: qty 2

## 2017-04-29 MED ORDER — LIDOCAINE 2% (20 MG/ML) 5 ML SYRINGE
INTRAMUSCULAR | Status: AC
  Filled 2017-04-29: qty 5

## 2017-04-29 MED ORDER — ROCURONIUM BROMIDE 100 MG/10ML IV SOLN
INTRAVENOUS | Status: DC | PRN
  Administered 2017-04-29: 30 mg via INTRAVENOUS
  Administered 2017-04-29 (×2): 10 mg via INTRAVENOUS

## 2017-04-29 MED ORDER — ONDANSETRON HCL 4 MG/2ML IJ SOLN
INTRAMUSCULAR | Status: AC
  Filled 2017-04-29: qty 2

## 2017-04-29 MED ORDER — DEXAMETHASONE SODIUM PHOSPHATE 10 MG/ML IJ SOLN
INTRAMUSCULAR | Status: AC
  Filled 2017-04-29: qty 1

## 2017-04-29 MED ORDER — GABAPENTIN 300 MG PO CAPS
300.0000 mg | ORAL_CAPSULE | ORAL | Status: AC
  Administered 2017-04-29: 300 mg via ORAL
  Filled 2017-04-29: qty 1

## 2017-04-29 MED ORDER — IOPAMIDOL (ISOVUE-300) INJECTION 61%
INTRAVENOUS | Status: DC | PRN
  Administered 2017-04-29: 5 mL

## 2017-04-29 MED ORDER — BUPIVACAINE-EPINEPHRINE (PF) 0.25% -1:200000 IJ SOLN
INTRAMUSCULAR | Status: AC
  Filled 2017-04-29: qty 30

## 2017-04-29 MED ORDER — DEXAMETHASONE SODIUM PHOSPHATE 10 MG/ML IJ SOLN
INTRAMUSCULAR | Status: DC | PRN
  Administered 2017-04-29: 10 mg via INTRAVENOUS

## 2017-04-29 MED ORDER — LACTATED RINGERS IR SOLN
Status: DC | PRN
  Administered 2017-04-29: 1000 mL

## 2017-04-29 MED ORDER — KETAMINE HCL 10 MG/ML IJ SOLN
INTRAMUSCULAR | Status: DC | PRN
  Administered 2017-04-29: 40 mg via INTRAVENOUS

## 2017-04-29 MED ORDER — ROCURONIUM BROMIDE 50 MG/5ML IV SOSY
PREFILLED_SYRINGE | INTRAVENOUS | Status: AC
  Filled 2017-04-29: qty 5

## 2017-04-29 MED ORDER — ONDANSETRON HCL 4 MG/2ML IJ SOLN
INTRAMUSCULAR | Status: DC | PRN
  Administered 2017-04-29: 4 mg via INTRAVENOUS

## 2017-04-29 MED ORDER — ACETAMINOPHEN 500 MG PO TABS
1000.0000 mg | ORAL_TABLET | ORAL | Status: AC
  Administered 2017-04-29: 1000 mg via ORAL
  Filled 2017-04-29: qty 2

## 2017-04-29 MED ORDER — KETAMINE HCL-SODIUM CHLORIDE 100-0.9 MG/10ML-% IV SOSY
PREFILLED_SYRINGE | INTRAVENOUS | Status: AC
  Filled 2017-04-29: qty 10

## 2017-04-29 MED ORDER — FENTANYL CITRATE (PF) 100 MCG/2ML IJ SOLN
25.0000 ug | INTRAMUSCULAR | Status: DC | PRN
  Administered 2017-04-29 (×3): 50 ug via INTRAVENOUS

## 2017-04-29 MED ORDER — 0.9 % SODIUM CHLORIDE (POUR BTL) OPTIME
TOPICAL | Status: DC | PRN
  Administered 2017-04-29: 1000 mL

## 2017-04-29 MED ORDER — HYDROCODONE-ACETAMINOPHEN 5-325 MG PO TABS: 1.0000 | ORAL_TABLET | Freq: Four times a day (QID) | ORAL | 0 refills | Status: DC | PRN

## 2017-04-29 MED ORDER — SUGAMMADEX SODIUM 500 MG/5ML IV SOLN
INTRAVENOUS | Status: DC | PRN
  Administered 2017-04-29: 200 mg via INTRAVENOUS

## 2017-04-29 MED ORDER — BUPIVACAINE-EPINEPHRINE 0.25% -1:200000 IJ SOLN
INTRAMUSCULAR | Status: DC | PRN
  Administered 2017-04-29: 30 mL

## 2017-04-29 MED ORDER — OXYCODONE HCL 5 MG PO TABS
5.0000 mg | ORAL_TABLET | Freq: Once | ORAL | Status: AC | PRN
  Administered 2017-04-29: 5 mg via ORAL

## 2017-04-29 MED ORDER — OXYCODONE HCL 5 MG PO TABS
ORAL_TABLET | ORAL | Status: AC
  Filled 2017-04-29: qty 1

## 2017-04-29 MED ORDER — LABETALOL HCL 5 MG/ML IV SOLN
INTRAVENOUS | Status: DC | PRN
  Administered 2017-04-29: 2.5 mg via INTRAVENOUS

## 2017-04-29 MED ORDER — SUCCINYLCHOLINE CHLORIDE 20 MG/ML IJ SOLN
INTRAMUSCULAR | Status: DC | PRN
  Administered 2017-04-29: 120 mg via INTRAVENOUS

## 2017-04-29 MED ORDER — CHLORHEXIDINE GLUCONATE CLOTH 2 % EX PADS: 6.0000 | MEDICATED_PAD | Freq: Once | CUTANEOUS | Status: DC

## 2017-04-29 MED ORDER — SUGAMMADEX SODIUM 200 MG/2ML IV SOLN
INTRAVENOUS | Status: AC
  Filled 2017-04-29: qty 2

## 2017-04-29 MED ORDER — FENTANYL CITRATE (PF) 100 MCG/2ML IJ SOLN
INTRAMUSCULAR | Status: AC
  Administered 2017-04-29: 50 ug via INTRAVENOUS
  Filled 2017-04-29: qty 4

## 2017-04-29 MED ORDER — PROPOFOL 10 MG/ML IV BOLUS
INTRAVENOUS | Status: AC
  Filled 2017-04-29: qty 20

## 2017-04-29 MED ORDER — LIDOCAINE 2% (20 MG/ML) 5 ML SYRINGE
INTRAMUSCULAR | Status: AC
  Filled 2017-04-29: qty 10

## 2017-04-29 MED ORDER — IOPAMIDOL (ISOVUE-300) INJECTION 61%
INTRAVENOUS | Status: AC
  Filled 2017-04-29: qty 50

## 2017-04-29 MED ORDER — LABETALOL HCL 5 MG/ML IV SOLN
INTRAVENOUS | Status: AC
  Filled 2017-04-29: qty 4

## 2017-04-29 MED ORDER — LACTATED RINGERS IV SOLN
INTRAVENOUS | Status: DC | PRN
  Administered 2017-04-29 (×2): via INTRAVENOUS

## 2017-04-29 MED ORDER — PROPOFOL 10 MG/ML IV BOLUS
INTRAVENOUS | Status: DC | PRN
  Administered 2017-04-29: 25 mg via INTRAVENOUS
  Administered 2017-04-29: 200 mg via INTRAVENOUS
  Administered 2017-04-29: 25 mg via INTRAVENOUS

## 2017-04-29 MED ORDER — MIDAZOLAM HCL 2 MG/2ML IJ SOLN
INTRAMUSCULAR | Status: AC
  Filled 2017-04-29: qty 2

## 2017-04-29 MED ORDER — FENTANYL CITRATE (PF) 100 MCG/2ML IJ SOLN
INTRAMUSCULAR | Status: AC
Start: 2017-04-29 — End: ?
  Filled 2017-04-29: qty 2

## 2017-04-29 MED ORDER — OXYCODONE HCL 5 MG/5ML PO SOLN: 5.0000 mg | Freq: Once | ORAL | Status: AC | PRN

## 2017-04-29 SURGICAL SUPPLY — 41 items
ADH SKN CLS APL DERMABOND .7 (GAUZE/BANDAGES/DRESSINGS) ×1
APL SKNCLS STERI-STRIP NONHPOA (GAUZE/BANDAGES/DRESSINGS)
APPLIER CLIP 5 13 M/L LIGAMAX5 (MISCELLANEOUS)
APPLIER CLIP ROT 10 11.4 M/L (STAPLE)
APR CLP MED LRG 11.4X10 (STAPLE)
APR CLP MED LRG 5 ANG JAW (MISCELLANEOUS)
BAG SPEC RTRVL 10 TROC 200 (ENDOMECHANICALS) ×1
BENZOIN TINCTURE PRP APPL 2/3 (GAUZE/BANDAGES/DRESSINGS) IMPLANT
CABLE HIGH FREQUENCY MONO STRZ (ELECTRODE) ×2 IMPLANT
CHLORAPREP W/TINT 26ML (MISCELLANEOUS) ×2 IMPLANT
CHOLANGIOGRAM CATH TAUT (CATHETERS) ×2 IMPLANT
CLIP APPLIE 5 13 M/L LIGAMAX5 (MISCELLANEOUS) IMPLANT
CLIP APPLIE ROT 10 11.4 M/L (STAPLE) IMPLANT
COVER MAYO STAND STRL (DRAPES) ×2 IMPLANT
COVER SURGICAL LIGHT HANDLE (MISCELLANEOUS) ×2 IMPLANT
DECANTER SPIKE VIAL GLASS SM (MISCELLANEOUS) ×2 IMPLANT
DERMABOND ADVANCED (GAUZE/BANDAGES/DRESSINGS) ×1
DERMABOND ADVANCED .7 DNX12 (GAUZE/BANDAGES/DRESSINGS) IMPLANT
DRAPE C-ARM 42X120 X-RAY (DRAPES) ×2 IMPLANT
ELECT REM PT RETURN 15FT ADLT (MISCELLANEOUS) ×2 IMPLANT
GLOVE SURG SIGNA 7.5 PF LTX (GLOVE) ×2 IMPLANT
GOWN STRL REUS W/TWL XL LVL3 (GOWN DISPOSABLE) ×6 IMPLANT
HEMOSTAT SURGICEL 4X8 (HEMOSTASIS) IMPLANT
IV CATH 14GX2 1/4 (CATHETERS) ×2 IMPLANT
IV SET EXTENSION CATH 6 NF (IV SETS) ×2 IMPLANT
KIT BASIN OR (CUSTOM PROCEDURE TRAY) ×2 IMPLANT
POUCH RETRIEVAL ECOSAC 10 (ENDOMECHANICALS) ×1 IMPLANT
POUCH RETRIEVAL ECOSAC 10MM (ENDOMECHANICALS) ×1
SCISSORS LAP 5X35 DISP (ENDOMECHANICALS) ×2 IMPLANT
SLEEVE ADV FIXATION 5X100MM (TROCAR) ×2 IMPLANT
STOPCOCK 4 WAY LG BORE MALE ST (IV SETS) ×2 IMPLANT
STRIP CLOSURE SKIN 1/4X4 (GAUZE/BANDAGES/DRESSINGS) IMPLANT
SUT MNCRL AB 4-0 PS2 18 (SUTURE) ×2 IMPLANT
SYR 10ML ECCENTRIC (SYRINGE) ×2 IMPLANT
TOWEL OR 17X26 10 PK STRL BLUE (TOWEL DISPOSABLE) ×2 IMPLANT
TOWEL OR NON WOVEN STRL DISP B (DISPOSABLE) ×2 IMPLANT
TRAY LAPAROSCOPIC (CUSTOM PROCEDURE TRAY) ×2 IMPLANT
TROCAR ADV FIXATION 11X100MM (TROCAR) IMPLANT
TROCAR ADV FIXATION 5X100MM (TROCAR) ×2 IMPLANT
TROCAR XCEL BLUNT TIP 100MML (ENDOMECHANICALS) ×2 IMPLANT
TUBING INSUF HEATED (TUBING) ×2 IMPLANT

## 2017-04-29 NOTE — Interval H&P Note (Signed)
History and Physical Interval Note:  04/29/2017 7:04 AM  Suzanne Wilcox  has presented today for surgery, with the diagnosis of gall bladder disease  The various methods of treatment have been discussed with the patient and family.   Husband, Dayvon, at home with sick kids.  After consideration of risks, benefits and other options for treatment, the patient has consented to  Procedure(s): LAPAROSCOPIC CHOLECYSTECTOMY WITH INTRAOPERATIVE CHOLANGIOGRAM ERAS PATHWAY (N/A) as a surgical intervention .  The patient's history has been reviewed, patient examined, no change in status, stable for surgery.  I have reviewed the patient's chart and labs.  Questions were answered to the patient's satisfaction.     Jermale Crass H

## 2017-04-29 NOTE — Discharge Instructions (Signed)
[facility] - DISCHARGE INSTRUCTIONS TO PATIENT  Return to work on:  05/14/2017  Activity:  Driving - May drive in 3 or 4 days, if doing well and off all pain meds   Lifting - No lifting more that 15 pounds for 7 days, then no limit  Wound Care:   Leave wounds dry for 2 days, then may shower  Diet:  As tolerated  Follow up appointment:  Call Dr. Allene PyoNewman's office [facility]) at [phone] for an appointment in 2 to 4 weeks.  Medications and dosages:  Resume your home medications.  You have a prescription for:  Vicodin  Call Dr. Ezzard StandingNewman or his office  [phone]) if you have:  Temperature greater than 100.4,  Persistent nausea and vomiting,  Severe uncontrolled pain,  Redness, tenderness, or signs of infection (pain, swelling, redness, odor or green/yellow discharge around the site),  Difficulty breathing, headache or visual disturbances,  Any other questions or concerns you may have after discharge.  In an emergency, call 911 or go to an Emergency Department at a nearby hospital.

## 2017-04-29 NOTE — Op Note (Signed)
04/29/2017  8:52 AM  PATIENT:  Suzanne Wilcox, 41 y.o., female, MRN: [id number]  PREOP DIAGNOSIS:  gall bladder disease  POSTOP DIAGNOSIS:   Chronic cholecystitis, cholelithiasis  PROCEDURE:   Procedure(s): LAPAROSCOPIC CHOLECYSTECTOMY WITH INTRAOPERATIVE CHOLANGIOGRAM  SURGEON:   Ovidio Kinavid Tristain Daily, M.D.  Threasa HeadsASSISTANGena Fray:   L. Kinsinger, M.D.  ANESTHESIA:   general  Anesthesiologist: Cristela BlueJackson, Kyle, MD CRNA: Elyn PeersAllen, Sandra J, CRNA; Edison PaceGray, Joanne E, CRNA  General  ASA: 2  EBL:  minimal  ml  BLOOD ADMINISTERED: none  DRAINS: none   LOCAL MEDICATIONS USED:   30 cc of 1/4% marcaine  SPECIMEN:   Gall bladder  COUNTS CORRECT:  YES  INDICATIONS FOR PROCEDURE:  Suzanne Wilcox is a 41 y.o. (DOB: 06/21/1976) white female whose primary care physician is Maurice SmallGriffin, Elaine, MD and comes for cholecystectomy.   The indications and risks of the gall bladder surgery were explained to the patient.  The risks include, but are not limited to, infection, bleeding, common bile duct injury and open surgery.  SURGERY:  The [facility]  The abdomen was prepped with chloroprep.  The patient was given 2 gm Ancef at the beginning of the operation.   A time out was held and the surgical checklist run.   I accessed her abdominal cavity with a 5 mm Ethicon optiview in the RUQ of the abdomen.  Three additional trocars were inserted: a 10 mm trocar in the sub-xiphoid location, a 5 mm trocar to the right of the umbilicus (for the camera), and a 5 mm trocar in the right lateral subcostal area.   The abdomen was explored and the liver, stomach, and bowel that could be seen were unremarkable.   The gall bladder had evidence of mild chronic cholecystitis.   I grasped the gall bladder and rotated it cephalad.  Disssection was carried down to the gall bladder/cystic duct junction and the cystic duct isolated.  A clip was placed on the gall bladder side of the cystic  duct.   An intra-operative cholangiogram was shot.   The intra-operative cholangiogram was shot using a cut off Taut catheter placed through a 14 gauge angiocath in the RUQ.  The Taut catheter was inserted in the cut cystic duct and secured with an endoclip.  A cholangiogram was shot with 8 cc of 1/2 strength Isoview.  Using fluoroscopy, the cholangiogram showed the flow of contrast into the common bile duct, up the hepatic radicals, and into the duodenum.  There was no mass or obstruction.  This was a normal intra-operative cholangiogram.   The Taut catheter was removed.  The cystic duct was tripley endoclipped and the cystic artery was identified and clipped.  The gall bladder was bluntly and sharpley dissected from the gall bladder bed.   After the gall bladder was removed from the liver, the gall bladder bed and Triangle of [place] were inspected.  There was no bleeding or bile leak.  The gall bladder was placed in a endocatch bag and delivered through the umbilicus.  The abdomen was irrigated with 1,000 cc saline.   The trocars were then removed.  I infiltrated 30cc of 1/4% Marcaine into the incisions.  The skin closed with 4-0 Monocryl at each site.  The skin was painted with DermaBond.  The patient's sponge and needle count were correct.  The patient was transported to the RR in good condition.  Ovidio Kinavid Kaleeya Hancock, MD, [facility] Pager: [phone] Office  phone:  [phone]

## 2017-04-29 NOTE — Transfer of Care (Signed)
Immediate Anesthesia Transfer of Care Note  Patient: Suzanne Wilcox  Procedure(s) Performed: LAPAROSCOPIC CHOLECYSTECTOMY WITH INTRAOPERATIVE CHOLANGIOGRAM (N/A Abdomen)  Patient Location: PACU  Anesthesia Type:General  Level of Consciousness: awake, oriented, drowsy, patient cooperative and responds to stimulation  Airway & Oxygen Therapy: Patient Spontanous Breathing and Patient connected to face mask oxygen  Post-op Assessment: Report given to RN, Post -op Vital signs reviewed and stable and Patient moving all extremities  Post vital signs: Reviewed and stable  Last Vitals:  Vitals:   04/29/17 0529  BP: 115/83  Pulse: 100  Resp: 18  Temp: 36.7 C  SpO2: 99%    Last Pain:  Vitals:   04/29/17 0529  TempSrc: Oral      Patients Stated Pain Goal: 4 (04/29/17 0552)  Complications: No apparent anesthesia complications

## 2017-04-29 NOTE — Anesthesia Preprocedure Evaluation (Signed)
Anesthesia Evaluation  Patient identified by MRN, date of birth, ID band Patient awake    Reviewed: Allergy & Precautions, H&P , Patient's Chart, lab work & pertinent test results, reviewed documented beta blocker date and time   Airway Mallampati: III  TM Distance: >3 FB Neck ROM: full    Dental no notable dental hx.    Pulmonary former smoker,    Pulmonary exam normal breath sounds clear to auscultation       Cardiovascular hypertension, On Medications  Rhythm:regular Rate:Normal     Neuro/Psych PSYCHIATRIC DISORDERS    GI/Hepatic   Endo/Other  Morbid obesity  Renal/GU      Musculoskeletal   Abdominal   Peds  Hematology   Anesthesia Other Findings   Reproductive/Obstetrics                             Anesthesia Physical Anesthesia Plan  ASA: III  Anesthesia Plan: General   Post-op Pain Management:    Induction: Intravenous  PONV Risk Score and Plan: 3 and Ondansetron, Dexamethasone, Midazolam and Treatment may vary due to age or medical condition  Airway Management Planned: Oral ETT  Additional Equipment:   Intra-op Plan:   Post-operative Plan: Extubation in OR  Informed Consent: I have reviewed the patients History and Physical, chart, labs and discussed the procedure including the risks, benefits and alternatives for the proposed anesthesia with the patient or authorized representative who has indicated his/her understanding and acceptance.   Dental Advisory Given  Plan Discussed with: CRNA and Surgeon  Anesthesia Plan Comments: (  )        Anesthesia Quick Evaluation

## 2017-04-29 NOTE — Anesthesia Procedure Notes (Signed)
Procedure Name: Intubation Date/Time: 04/29/2017 7:36 AM Performed by: Lyndle Herrlich Pre-anesthesia Checklist: Patient identified, Emergency Drugs available, Suction available, Patient being monitored and Timeout performed Patient Re-evaluated:Patient Re-evaluated prior to induction Oxygen Delivery Method: Circle system utilized Preoxygenation: Pre-oxygenation with 100% oxygen Induction Type: IV induction and Cricoid Pressure applied Ventilation: Mask ventilation without difficulty Laryngoscope Size: Mac and 4 Grade View: Grade I Tube type: Oral Tube size: 7.5 mm Number of attempts: 1 Airway Equipment and Method: Stylet Placement Confirmation: ETT inserted through vocal cords under direct vision,  positive ETCO2 and breath sounds checked- equal and bilateral Secured at: 21 cm Tube secured with: Tape Dental Injury: Teeth and Oropharynx as per pre-operative assessment

## 2017-04-29 NOTE — Anesthesia Postprocedure Evaluation (Signed)
Anesthesia Post Note  Patient: Cristol A Gozick-Goldblatt  Procedure(s) Performed: LAPAROSCOPIC CHOLECYSTECTOMY WITH INTRAOPERATIVE CHOLANGIOGRAM (N/A Abdomen)     Patient location during evaluation: PACU Anesthesia Type: General Level of consciousness: awake and alert Pain management: pain level controlled Vital Signs Assessment: post-procedure vital signs reviewed and stable Respiratory status: spontaneous breathing, nonlabored ventilation, respiratory function stable and patient connected to nasal cannula oxygen Cardiovascular status: blood pressure returned to baseline and stable Postop Assessment: no apparent nausea or vomiting Anesthetic complications: no    Last Vitals:  Vitals:   04/29/17 1015 04/29/17 1030  BP: 136/83 (!) 106/50  Pulse: 75 86  Resp: 17 16  Temp: (!) 36.4 C 36.4 C  SpO2: 94% 97%    Last Pain:  Vitals:   04/29/17 1030  TempSrc:   PainSc: 7                  Meoshia Billing EDWARD

## 2017-04-30 ENCOUNTER — Encounter (HOSPITAL_COMMUNITY): Payer: Self-pay | Admitting: Surgery

## 2017-06-14 ENCOUNTER — Ambulatory Visit: Payer: Self-pay

## 2017-06-14 ENCOUNTER — Ambulatory Visit (INDEPENDENT_AMBULATORY_CARE_PROVIDER_SITE_OTHER): Payer: BLUE CROSS/BLUE SHIELD | Admitting: Sports Medicine

## 2017-06-14 ENCOUNTER — Encounter: Payer: Self-pay | Admitting: Sports Medicine

## 2017-06-14 VITALS — BP 122/78 | HR 94 | Ht 68.0 in | Wt 332.0 lb

## 2017-06-14 DIAGNOSIS — M5020 Other cervical disc displacement, unspecified cervical region: Secondary | ICD-10-CM | POA: Diagnosis not present

## 2017-06-14 DIAGNOSIS — M25522 Pain in left elbow: Secondary | ICD-10-CM | POA: Diagnosis not present

## 2017-06-14 DIAGNOSIS — M7711 Lateral epicondylitis, right elbow: Secondary | ICD-10-CM

## 2017-06-14 DIAGNOSIS — Z23 Encounter for immunization: Secondary | ICD-10-CM | POA: Diagnosis not present

## 2017-06-14 DIAGNOSIS — M7712 Lateral epicondylitis, left elbow: Secondary | ICD-10-CM | POA: Diagnosis not present

## 2017-06-14 MED ORDER — NITROGLYCERIN 0.2 MG/HR TD PT24: MEDICATED_PATCH | TRANSDERMAL | 1 refills | Status: DC

## 2017-06-14 NOTE — Progress Notes (Signed)
Veverly FellsMichael D. Delorise Shinerigby, DO  [facility] [facility] [place] [phone]  Suzanne Chinaanya A Gozick-Jarrard - 41 y.o. female MRN [id number]  Date of birth: 10/29/1975  Visit Date: 06/14/2017  PCP: Maurice SmallGriffin, Elaine, MD   Referred by: Maurice SmallGriffin, Elaine, MD   Scribe for today's visit: Christoper FabianMolly Weber, ATC    SUBJECTIVE:  Suzanne Wilcox is here for Initial Assessment (B elbow pain) .   Her B elbow pain symptoms (L >R) INITIALLY: Began about 2 months ago and she feels it started from carrying her purse on her L elbow. Described as 4-5/10 at rest and a 7/10 at it's worst described as an achy, tightness radiating to the L arm. Worsened with picking up objects heavier than a pound, carrying her purse, sleeping on the L side, some rotational movements of the forearm. Improved with rest, pain medication (Vicodin) from her gall bladder surgery. Additional associated symptoms include: no N/T noted but radiating pain noted into the L UE.    At this time symptoms are worsening compared to onset due to the amount and frequency of pain in her L elbow and arm. She has not been doing anything in particular for these symptoms.   ROS Denies night time disturbances. Denies fevers, chills, or night sweats. Denies unexplained weight loss. Denies personal history of cancer. Denies changes in bowel or bladder habits. Denies recent unreported falls. Denies new or worsening dyspnea or wheezing. Reports headaches or dizziness.  Reports numbness, tingling or weakness  In the extremities. In B hands Denies dizziness or presyncopal episodes Denies lower extremity edema     HISTORY & PERTINENT PRIOR DATA:  Prior History reviewed and updated per electronic medical record.  Significant history, findings, studies and interim changes include:  reports that she quit smoking about 8 years ago. Her smoking use included cigarettes. She has a 36.00 pack-year smoking history. she has never used  smokeless tobacco. No results for input(s): HGBA1C, LABURIC, CREATINE in the last 8760 hours. No specialty comments available. Problem  Herniated Cervical Intervertebral Disc   MRI July 2017-  1. Moderate left C5-6 neural foraminal narrowing, primarily caused by a left foraminal disc protrusion. 2. No other significant spinal canal or neural foraminal stenosis.      OBJECTIVE:  VS:  HT:5\' 8"  (172.7 cm)   WT:(!) 332 lb (150.6 kg)  BMI:50.49    BP:122/78  HR:94bpm  TEMP: ( )  RESP:99 %  PHYSICAL EXAM: Constitutional: WDWN, Non-toxic appearing. Psychiatric: Alert & appropriately interactive. Not depressed or anxious appearing. Respiratory: No increased work of breathing. Trachea Midline Eyes: Pupils are equal. EOM intact without nystagmus. No scleral icterus Cardiovascular:  Peripheral Pulses: peripheral pulses symmetrical No clubbing or cyanosis appreciated Capillary Refill is normal, less than 2 seconds No signficant generalized edema/anasarca Sensory Exam: intact to light touch  Bilateral elbows are overall well aligned.  She has full flexion extension although pain with terminal extension localizing to the lateral condyle bilaterally left worse than right.  Pain is worse with resisted wrist extension.  Grip strength is intact.  No dermatomal distribution dysesthesia.  No significant pain with arm squeeze test or brachial plexus squeeze.  Negative Spurling's.  No additional findings.   ASSESSMENT & PLAN:   1. Elbow pain, left   2. Need for influenza vaccination   3. Bilateral tennis elbow   4. Herniated cervical intervertebral disc    PLAN: Symptoms consistent with bilateral epicondylosis.  Start on therapeutic exercises per procedure note, consider compression  but given her soft tissue envelope tennis elbow strapping can be considered over-the-counter.  Discussed multiple options including anti-inflammatories and injection we will proceed with nitroglycerin at this time.   Reviewed potential side effects in detail.  If any lack of improvement can consider injection   ++++++++++++++++++++++++++++++++++++++++++++ Orders & Meds: Orders Placed This Encounter  Procedures  . Misc procedure  . US LIMITED JOINT SPACE STRUCTURES UP BILAT(NO LINKED CHARGES)  . Flu Vaccine QUAD 6+ mos PF IM (Fluarix Quad PF)    Meds ordered this encounter  Medications  . nitroGLYCERIN (NITRODUR - DOSED IN MG/24 HR) 0.2 mg/hr patch    Sig: Place 1/4 to 1/2 of a patch over affected region. Remove and replace once daily.  Slightly alter skin placement daily    Dispense:  30 patch    Refill:  1    For musculoskeletal purposes.  Okay to cut patch.    ++++++++++++++++++++++++++++++++++++++++++++ Follow-up: Return in about 6 weeks (around 07/26/2017).   Pertinent documentation may be included in additional procedure notes, imaging studies, problem based documentation and patient instructions. Please see these sections of the encounter for additional information regarding this visit. CMA/ATC served as Neurosurgeonscribe during this visit. History, Physical, and Plan performed by medical provider. Documentation and orders reviewed and attested to.      Andrena MewsMichael D Rigby, DO    [place] [facility] Physician

## 2017-06-14 NOTE — Procedures (Signed)
LIMITED MSK ULTRASOUND OF bilateral elbows Images were obtained and interpreted by myself, Gaspar BiddingMichael Cadey Bazile, DO  Images have been saved and stored to PACS system. Images obtained on: GE S7 Ultrasound machine  FINDINGS:   Right common extensor tendon has hypoechoic change and diminutive in size compared to expected values.  There is minimal neovascularity  Left common extensor tendon has a hypoechoic change within the origin and at the base.  There is minimal neovascularity.  Pain with sono palpation.  IMPRESSION:  1. Bilateral lateral epicondylosis

## 2017-06-14 NOTE — Patient Instructions (Addendum)
Please perform the exercise program that we have prepared for you and gone over in detail on a daily basis.  In addition to the handout you were provided you can access your program through: [email or web address]   Your unique program code is: [email or web address]  Nitroglycerin Protocol   Apply 1/4 nitroglycerin patch to affected area daily.  Change position of patch within the affected area every 24 hours.  You may experience a headache during the first 1-2 weeks of using the patch, these should subside.  If you experience headaches after beginning nitroglycerin patch treatment, you may take your preferred over the counter pain reliever.  Another side effect of the nitroglycerin patch is skin irritation or rash related to patch adhesive.  Please notify our office if you develop more severe headaches or rash, and stop the patch.  Tendon healing with nitroglycerin patch may require 12 to 24 weeks depending on the extent of injury.  Men should not use if taking Viagra, Cialis, or Levitra.   Do not use if you have migraines or rosacea.

## 2017-06-14 NOTE — Procedures (Signed)
PROCEDURE NOTE: THERAPEUTIC EXERCISES (97110) 15 minutes spent for Therapeutic exercises as below and as referenced in the AVS. This included exercises focusing on stretching, strengthening, with significant focus on eccentric aspects.  Proper technique shown and discussed handout in great detail with ATC. All questions were discussed and answered.   Long term goals include an improvement in range of motion, strength, endurance as well as avoiding reinjury. Frequency of visits is one time as determined during today's  office visit. Frequency of exercises to be performed is as per handout.  EXERCISES REVIEWED:  Common flexor tendon stretching and strengthening  Ice cup massage  Tennis elbow bracing

## 2017-06-15 DIAGNOSIS — J069 Acute upper respiratory infection, unspecified: Secondary | ICD-10-CM | POA: Diagnosis not present

## 2017-06-15 DIAGNOSIS — J209 Acute bronchitis, unspecified: Secondary | ICD-10-CM | POA: Diagnosis not present

## 2017-06-28 ENCOUNTER — Encounter: Payer: Self-pay | Admitting: Sports Medicine

## 2017-06-28 DIAGNOSIS — M5020 Other cervical disc displacement, unspecified cervical region: Secondary | ICD-10-CM | POA: Insufficient documentation

## 2017-06-28 NOTE — Assessment & Plan Note (Signed)
Does not seem to be primary neurogenic in nature but there may be an underlying secondary cause.  If any lack of improvement can consider starting gabapentin back but will defer

## 2017-07-23 ENCOUNTER — Encounter: Payer: Self-pay | Admitting: Sports Medicine

## 2017-07-23 ENCOUNTER — Ambulatory Visit (INDEPENDENT_AMBULATORY_CARE_PROVIDER_SITE_OTHER): Payer: BLUE CROSS/BLUE SHIELD | Admitting: Sports Medicine

## 2017-07-23 VITALS — BP 130/94 | HR 97 | Ht 68.0 in | Wt 333.0 lb

## 2017-07-23 DIAGNOSIS — M5441 Lumbago with sciatica, right side: Secondary | ICD-10-CM

## 2017-07-23 MED ORDER — HYDROCODONE-ACETAMINOPHEN 7.5-325 MG PO TABS: 1.0000 | ORAL_TABLET | Freq: Four times a day (QID) | ORAL | 0 refills | Status: DC | PRN

## 2017-07-23 MED ORDER — METHYLPREDNISOLONE 4 MG PO TBPK: ORAL_TABLET | ORAL | 0 refills | Status: DC

## 2017-07-23 MED ORDER — KETOROLAC TROMETHAMINE 60 MG/2ML IM SOLN
60.0000 mg | Freq: Once | INTRAMUSCULAR | Status: AC
Start: 2017-07-23 — End: 2017-07-23
  Administered 2017-07-23: 60 mg via INTRAMUSCULAR

## 2017-07-23 MED ORDER — METHYLPREDNISOLONE ACETATE 80 MG/ML IJ SUSP
80.0000 mg | Freq: Once | INTRAMUSCULAR | Status: AC
  Administered 2017-07-23: 80 mg via INTRAMUSCULAR

## 2017-07-23 MED ORDER — METHOCARBAMOL 500 MG PO TABS: 500.0000 mg | ORAL_TABLET | Freq: Three times a day (TID) | ORAL | 1 refills | Status: DC | PRN

## 2017-07-23 NOTE — Progress Notes (Signed)
depo Veverly FellsMichael D. Delorise Shinerigby, DO  [place] [facility] [place] [phone]  Suzanne Chinaanya A Gozick-Potts - [age] y.o. female MRN [id number]  Date of birth: 11/28/1975  Visit Date: 07/23/2017  PCP: Maurice SmallGriffin, Elaine, MD   Referred by: Maurice SmallGriffin, Elaine, MD   Scribe for today's visit: Stevenson ClinchBrandy Coleman, CMA     SUBJECTIVE:  Suzanne Wilcox is here for RT-sided low back pain  Her RT-sided back pain symptoms INITIALLY: Began this morning and began after she went from bending to standing while twisting.  Described as severe sharp pain, radiating to RT hip and into RT leg, pain does not radiate past RT knee.  Worsened with movement. Pain was more intense when trying to have BM.  Improved with rest.  Additional associated symptoms include: She does have mild RT groin pain. She denies thoracic pain. She did have pain that was shooting into neck at one point but that has resolved.     At this time symptoms show no change compared to onset  She has been taking tramadol with no relief.    ROS Denies night time disturbances. Denies fevers, chills, or night sweats. Denies unexplained weight loss. Denies personal history of cancer. Reports changes in bowel or bladder habits, urinating less frequently. Denies recent unreported falls. Denies new or worsening dyspnea or wheezing. Denies headaches or dizziness.  Reports numbness, tingling or weakness  In the extremities.  Reports dizziness or presyncopal episodes Denies lower extremity edema     HISTORY & PERTINENT PRIOR DATA:  Prior History reviewed and updated per electronic medical record.  Significant history, findings, studies and interim changes include:  reports that she quit smoking about 8 years ago. Her smoking use included cigarettes. She has a 36.00 pack-year smoking history. she has never used smokeless tobacco. No results for input(s): HGBA1C, LABURIC, CREATINE in the last 8760 hours. No specialty comments  available. No problems updated.  OBJECTIVE:  VS:  HT:5\' 8"  (172.7 cm)   WT:(!) 333 lb (151 kg)  BMI:50.64    BP:(!) 130/94  HR:97bpm  TEMP: ( )  RESP:97 %   PHYSICAL EXAM: Constitutional: WDWN, Non-toxic appearing. Psychiatric: Alert & appropriately interactive.  Not depressed or anxious appearing. Respiratory: No increased work of breathing.  Trachea Midline Eyes: Pupils are equal.  EOM intact without nystagmus.  No scleral icterus  NEUROVASCULAR exam: No clubbing or cyanosis appreciated No significant venous stasis changes   Positive straight leg raise right greater than left localizing to the low back.  Pain with palpation of the popliteal space as well as greater sciatic notch.  She is diaphoretic along the lower lumbar spine.  Has a difficult time completely standing upright but is in no acute respiratory distress.  My double strength testing is intact to manual muscle testing but slightly diminished diffusely and symmetric secondary to pain.  Lower extremity reflexes are 1+/4 diffusely.  No additional findings.   ASSESSMENT & PLAN:   1. Acute right-sided low back pain with right-sided sciatica    PLAN: Likely acute HNP with positive neural tension signs but no red flag symptoms.  Discussed red flag symptoms that warrant earlier emergent evaluation and patient voices understanding.  Symptomatic treatment per orders and follow-up in 1 week to ensure clinical improvement and addition of therapeutic exercises.  ++++++++++++++++++++++++++++++++++++++++++++ Orders & Meds: No orders of the defined types were placed in this encounter.   Meds ordered this encounter  Medications  . methylPREDNISolone acetate (DEPO-MEDROL) injection 80 mg  .  ketorolac (TORADOL) injection 60 mg  . DISCONTD: methylPREDNISolone (MEDROL DOSEPAK) 4 MG TBPK tablet    Sig: Take by mouth as directed. Take 6 tablets on the first day prescribed then as directed.    Dispense:  21 tablet    Refill:  0    . methocarbamol (ROBAXIN) 500 MG tablet    Sig: Take 1 tablet (500 mg total) by mouth every 8 (eight) hours as needed for muscle spasms.    Dispense:  60 tablet    Refill:  1  . HYDROcodone-acetaminophen (NORCO) 7.5-325 MG tablet    Sig: Take 1 tablet by mouth every 6 (six) hours as needed.    Dispense:  20 tablet    Refill:  0    Appropriate [place] controlled substance database was performed prior to administration.  ++++++++++++++++++++++++++++++++++++++++++++ Follow-up: No Follow-up on file.  Pertinent documentation may be included in additional procedure notes, imaging studies, problem based documentation and patient instructions. Please see these sections of the encounter for additional information regarding this visit. CMA/ATC served as Neurosurgeon during this visit. History, Physical, and Plan performed by medical provider. Documentation and orders reviewed and attested to.      Andrena Mews, DO    [place] [facility] Physician

## 2017-08-02 ENCOUNTER — Ambulatory Visit: Payer: BLUE CROSS/BLUE SHIELD | Admitting: Sports Medicine

## 2017-08-04 ENCOUNTER — Encounter: Payer: Self-pay | Admitting: Sports Medicine

## 2017-08-04 ENCOUNTER — Ambulatory Visit (INDEPENDENT_AMBULATORY_CARE_PROVIDER_SITE_OTHER): Payer: BLUE CROSS/BLUE SHIELD | Admitting: Sports Medicine

## 2017-08-04 VITALS — BP 118/80 | HR 101 | Ht 68.0 in | Wt 331.8 lb

## 2017-08-04 DIAGNOSIS — M25522 Pain in left elbow: Secondary | ICD-10-CM | POA: Diagnosis not present

## 2017-08-04 DIAGNOSIS — M5441 Lumbago with sciatica, right side: Secondary | ICD-10-CM

## 2017-08-04 MED ORDER — GABAPENTIN 100 MG PO CAPS: 100.0000 mg | ORAL_CAPSULE | Freq: Three times a day (TID) | ORAL | 1 refills | Status: DC

## 2017-08-04 NOTE — Patient Instructions (Addendum)
Also check out [employer]"Foundation Training" which is a program developed by Dr. Myles LippsEric Goodman.   There are links to a couple of his YouTube Videos below and I would like to see performing one of his videos 5-6 days per week.    A good intro video is: "Independence from Pain 7-minute Video" - [email or web address]   His more advanced video is: "Powerful Posture and Pain Relief: 12 minutes of Foundation Training" - https://youtu.be/[email or web address]  Do not try to attempt this entire video when first beginning.    Try breaking of each exercise that he goes into shorter segments.  Otherwise if they perform an exercise for 45 seconds, start with 15 seconds and rest and then resume when they begin the new activity.    If you work your way up to doing this 12 minute video, I expect you will see significant improvements in your pain.  If you enjoy his videos and would like to find out more you can look on his website: [email or web address].  He has a workout streaming option as well as a DVD set available for purchase.  Amazon has the best price for his DVDs.     ++++++++++++++++++++++++++++++++++++++++++++  I would like for you to begin on the Gabapentin (neurontin) just at night for the next week.  After 1 week you can increase the dose to one tablet in the morning and one tablet 1 hour before bed.  Over the next week, if you find you are having worsening pain around dinner time that is a sign that you are metabolizing the medication rapidly and would benefit from adding a mid afternoon dose (3 tablets daily = 1 tablet in the morning, 1 tablet around 3pm, 1 tablet 1 hour before bed).    The biggest side effect of Gabapentin is sleepiness hence why we start it at night.  Usually this sleepiness side-effect diminishes after a week and you are left with a good night sleep and improved control of your symptoms; if you are too sleepy please call and let us know so we can consider adjusting your  dose.

## 2017-08-04 NOTE — Progress Notes (Signed)
  Veverly FellsMichael D. Delorise Shinerigby, DO  [place] [facility] [place] [phone]  Suzanne Wilcox - 42 y.o. female MRN [id number]  Date of birth: 10/12/1975  Visit Date: 08/04/2017  PCP: Maurice SmallGriffin, Elaine, MD   Referred by: Maurice SmallGriffin, Elaine, MD   Scribe for today's visit: Christoper FabianMolly Weber, LAT, ATC     SUBJECTIVE:  Suzanne Chinaanya A Gozick-Leeman is here for Follow-up (LBP w/ R sciatica and B elbow pain) .   Compared to the last office visit on 07/23/17, her previously described LBP w/ R LE sciatica and B tennis elbow symptoms are improving w/ decreased B elbow pain noted and decreased LBP. Current symptoms are mild (elbows) and mod-severe (back) & are radiating to R LE all the way to the toes and also noticed some shooting pain into R pinky finger this morning. She has been using nitroglycerin patches and feels that these are helping w/ her B elbow pain.  She has finished the steroid dose pack.  Also states that her B knees have flared up again over the past few weeks.  Notes particular issue w/ ascending stairs.  ROS Reports night time disturbances.  Yes w/ her back. Denies fevers, chills, or night sweats. Denies unexplained weight loss. Denies personal history of cancer. Denies changes in bowel or bladder habits. Denies recent unreported falls. Denies new or worsening dyspnea or wheezing. Reports headaches or dizziness.  Daily headaches that she feels are stemming from her neck. Reports numbness, tingling or weakness  In the extremities - B UEs. Denies dizziness or presyncopal episodes Denies lower extremity edema       Please see additional documentation for Objective, Assessment and Plan sections. Pertinent additional documentation may be included in corresponding procedure notes, imaging studies, problem based documentation and patient instructions. Please see these sections of the encounter for additional information regarding this visit.  CMA/ATC served as Neurosurgeonscribe  during this visit. History, Physical, and Plan performed by medical provider. Documentation and orders reviewed and attested to.      Andrena MewsMichael D Rigby, DO    [place] [facility] Physician

## 2017-08-18 ENCOUNTER — Encounter: Payer: Self-pay | Admitting: Sports Medicine

## 2017-09-01 ENCOUNTER — Encounter: Payer: Self-pay | Admitting: Sports Medicine

## 2017-09-09 DIAGNOSIS — I10 Essential (primary) hypertension: Secondary | ICD-10-CM | POA: Diagnosis not present

## 2017-09-09 DIAGNOSIS — N3090 Cystitis, unspecified without hematuria: Secondary | ICD-10-CM | POA: Diagnosis not present

## 2017-09-09 DIAGNOSIS — R319 Hematuria, unspecified: Secondary | ICD-10-CM | POA: Diagnosis not present

## 2017-09-15 ENCOUNTER — Encounter: Payer: Self-pay | Admitting: Sports Medicine

## 2017-09-15 ENCOUNTER — Ambulatory Visit (INDEPENDENT_AMBULATORY_CARE_PROVIDER_SITE_OTHER): Payer: BLUE CROSS/BLUE SHIELD | Admitting: Sports Medicine

## 2017-09-15 VITALS — BP 130/88 | HR 100 | Wt 333.2 lb

## 2017-09-15 DIAGNOSIS — M5020 Other cervical disc displacement, unspecified cervical region: Secondary | ICD-10-CM

## 2017-09-15 DIAGNOSIS — Z6841 Body Mass Index (BMI) 40.0 and over, adult: Secondary | ICD-10-CM

## 2017-09-15 DIAGNOSIS — M4726 Other spondylosis with radiculopathy, lumbar region: Secondary | ICD-10-CM | POA: Diagnosis not present

## 2017-09-15 DIAGNOSIS — M47816 Spondylosis without myelopathy or radiculopathy, lumbar region: Secondary | ICD-10-CM | POA: Insufficient documentation

## 2017-09-15 NOTE — Assessment & Plan Note (Signed)
Moderate changes previously on X-ray peristent symptoms Discussed MRI vs PT. She will call if any worsening Okay to D/c gabapentin as she reports it is minimally effective Discussed importance of working on core strenghtening, wt loss and increased activity Red flags reviewed

## 2017-09-15 NOTE — Progress Notes (Signed)
  Tomie Chinaanya A Gozick-Debois - 42 y.o. female MRN [id number]  Date of birth: 11/28/1975  Scribe for today's visit: Durwin GlazeJoEllen Thomposn, CMA      SUBJECTIVE:  Tomie Chinaanya A Gozick-Moraes is here for No chief complaint on file. [name].  Referred by: self 08/04/17: Compared to the last office visit on 07/23/17, her previously described LBP w/ R LE sciatica and B tennis elbow symptoms are improving w/ decreased B elbow pain noted and decreased LBP. Current symptoms are mild (elbows) and mod-severe (back) & are radiating to R LE all the way to the toes and also noticed some shooting pain into R pinky finger this morning. She has been using nitroglycerin patches and feels that these are helping w/ her B elbow pain.  She has finished the steroid dose pack. Also states that her B knees have flared up again over the past few weeks.  Notes particular issue w/ ascending stairs.  09/15/17: Compared to the last office visit, her previously described symptoms of LBP are worsening radiating to new area Current symptoms are moderate (5/10) & are radiating into hip into right leg to out side of thigh to front of shin.  She has been taking Gabapentin 100 mg TID with no side effects. She has been doing home exercised as directed with no trouble.  Last XR l-spine 05/06/16.  Compared to the last office visit, her previously described symptoms of elbow pain are improving, prior arm pain has resolved   ROS Reports night time disturbances. Reports fevers, chills, or night sweats.fever yesterday only  Denies unexplained weight loss. Denies personal history of cancer. Denies changes in bowel or bladder habits. Denies recent unreported falls. Denies new or worsening dyspnea or wheezing. Denies headaches or dizziness.  Denies numbness, tingling or weakness  In the extremities.  Denies dizziness or presyncopal episodes Denies lower extremity edema      Please see additional documentation for Objective, Assessment and Plan sections.  Pertinent additional documentation may be included in corresponding procedure notes, imaging studies, problem based documentation and patient instructions. Please see these sections of the encounter for additional information regarding this visit.  CMA/ATC served as Neurosurgeonscribe during this visit. History, Physical, and Plan performed by medical provider. Documentation and orders reviewed and attested to.      Andrena MewsMichael D Srihari Shellhammer, DO    [place] [facility] Physician

## 2017-09-15 NOTE — Progress Notes (Signed)
   Veverly FellsMichael D. Delorise Shinerigby, DO   [facility] [place] [phone]  Suzanne Wilcox - 42 y.o. female MRN [id number]  Date of birth: 10/07/1975  Visit Date:   PCP: Maurice SmallGriffin, Elaine, MD   Referred by: Maurice SmallGriffin, Elaine, MD  Please see additional documentation for HPI, review of systems.  HISTORY & PERTINENT PRIOR DATA:  Prior History reviewed and updated per electronic medical record.  Significant history, findings, studies and interim changes include:  reports that she quit smoking about 8 years ago. Her smoking use included cigarettes. She has a 36.00 pack-year smoking history. she has never used smokeless tobacco. No results for input(s): HGBA1C, LABURIC, CREATINE in the last 8760 hours. No specialty comments available. Problem  Spondylosis of Lumbar Joint    OBJECTIVE:  VS:  HT:    WT:(!) 333 lb 3.2 oz (151.1 kg)  BMI:     BP:130/88  HR:100bpm  TEMP: ( )  RESP:98 %   PHYSICAL EXAM: Constitutional: WDWN, Non-toxic appearing. Psychiatric: Alert & appropriately interactive.  Not depressed or anxious appearing. Respiratory: No increased work of breathing.  Trachea Midline Eyes: Pupils are equal.  EOM intact without nystagmus.  No scleral icterus  NEUROVASCULAR exam: No clubbing or cyanosis appreciated No significant venous stasis changes Capillary Refill: normal, less than 2 seconds  + SLR on right but able to heel and toe walk without difficulty other than balance Mild pain with palp of popliteal space and of the greater sciatic notch Cervical Side bending to the left limited. spurling's negative   ASSESSMENT & PLAN:   1. Herniated cervical intervertebral disc   2. Osteoarthritis of spine with radiculopathy, lumbar region   3. Class 3 severe obesity due to excess calories without serious comorbidity with body mass index (BMI) of 50.0 to 59.9 in adult [facility](HCC)    Orders & Meds: No orders of the defined types were placed in this  encounter.  No orders of the defined types were placed in this encounter.   PLAN:        Spondylosis of lumbar joint Moderate changes previously on X-ray peristent symptoms Discussed MRI vs PT. She will call if any worsening Okay to D/c gabapentin as she reports it is minimally effective Discussed importance of working on core strenghtening, wt loss and increased activity Red flags reviewed   Follow-up: Return in about 8 weeks (around 11/10/2017).

## 2017-09-20 NOTE — Progress Notes (Signed)
  Suzanne Wilcox - 42 y.o. female MRN [id number]  Date of birth: 11/21/1975  Visit Date: 08/04/2017  PCP: Maurice SmallGriffin, Elaine, MD   Referred by: Maurice SmallGriffin, Elaine, MD  Please see additional documentation for HPI, review of systems.  HISTORY & PERTINENT PRIOR DATA:  Prior History reviewed and updated per electronic medical record.  Significant/pertinent history, findings, studies include:  reports that she quit smoking about 8 years ago. Her smoking use included cigarettes. She has a 36.00 pack-year smoking history. She has never used smokeless tobacco. No results for input(s): HGBA1C, LABURIC, CREATINE in the last 8760 hours. No specialty comments available. No problems updated.  OBJECTIVE:  VS:  HT:5\' 8"  (172.7 cm)   WT:(Abnormal) 331 lb 12.8 oz (150.5 kg)  BMI:50.46    BP:118/80  HR:(Abnormal) 101bpm  TEMP: ( )  RESP:96 %   PHYSICAL EXAM: Constitutional: WDWN, Non-toxic appearing. Psychiatric: Alert & appropriately interactive.  Not depressed or anxious appearing. Respiratory: No increased work of breathing.  Trachea Midline Eyes: Pupils are equal.  EOM intact without nystagmus.  No scleral icterus  VASCULAR: warm to touch calf supple with no pain with squeeze upper and lower extremity neuro exam: unremarkable normal strength normal sensation  no pain to palpation, good flexion and extension, reflexes are 2+ and symmetric, motor and sensory appear to be normal, negative findings: Straight leg raise.  She is able to heel toe walk without difficulty.  Left elbow has a small amount of pain over the extensor tendons as well as within the common extensor musculature.  Some radiation into the fingers consistent with radial tunnel.   ASSESSMENT & PLAN:   1. Acute right-sided low back pain with right-sided sciatica   2. Elbow pain, left     PLAN: Discussed the foundation of treatment for this condition is physical therapy and/or daily (5-6 days/week) therapeutic exercises,  focusing on core strengthening, coordination, neuromuscular control/reeducation.  Therapeutic exercises prescribed per procedure note.  Gabapentin titration      Follow-up: Return in about 6 weeks (around 09/15/2017).

## 2017-09-29 DIAGNOSIS — F3111 Bipolar disorder, current episode manic without psychotic features, mild: Secondary | ICD-10-CM | POA: Diagnosis not present

## 2017-09-29 DIAGNOSIS — F3131 Bipolar disorder, current episode depressed, mild: Secondary | ICD-10-CM | POA: Diagnosis not present

## 2017-09-29 DIAGNOSIS — F410 Panic disorder [episodic paroxysmal anxiety] without agoraphobia: Secondary | ICD-10-CM | POA: Diagnosis not present

## 2017-11-10 ENCOUNTER — Ambulatory Visit: Payer: BLUE CROSS/BLUE SHIELD | Admitting: Sports Medicine

## 2018-03-23 DIAGNOSIS — F900 Attention-deficit hyperactivity disorder, predominantly inattentive type: Secondary | ICD-10-CM | POA: Diagnosis not present

## 2018-03-23 DIAGNOSIS — F3174 Bipolar disorder, in full remission, most recent episode manic: Secondary | ICD-10-CM | POA: Diagnosis not present

## 2018-03-23 DIAGNOSIS — F3176 Bipolar disorder, in full remission, most recent episode depressed: Secondary | ICD-10-CM | POA: Diagnosis not present

## 2018-03-31 DIAGNOSIS — F419 Anxiety disorder, unspecified: Secondary | ICD-10-CM | POA: Diagnosis not present

## 2018-03-31 DIAGNOSIS — Z131 Encounter for screening for diabetes mellitus: Secondary | ICD-10-CM | POA: Diagnosis not present

## 2018-03-31 DIAGNOSIS — Z0000 Encounter for general adult medical examination without abnormal findings: Secondary | ICD-10-CM | POA: Diagnosis not present

## 2018-03-31 DIAGNOSIS — I10 Essential (primary) hypertension: Secondary | ICD-10-CM | POA: Diagnosis not present

## 2018-03-31 DIAGNOSIS — K219 Gastro-esophageal reflux disease without esophagitis: Secondary | ICD-10-CM | POA: Diagnosis not present

## 2018-04-06 DIAGNOSIS — A088 Other specified intestinal infections: Secondary | ICD-10-CM | POA: Diagnosis not present

## 2018-04-20 DIAGNOSIS — Z1231 Encounter for screening mammogram for malignant neoplasm of breast: Secondary | ICD-10-CM | POA: Diagnosis not present

## 2018-04-20 DIAGNOSIS — Z6841 Body Mass Index (BMI) 40.0 and over, adult: Secondary | ICD-10-CM | POA: Diagnosis not present

## 2018-04-20 DIAGNOSIS — Z01419 Encounter for gynecological examination (general) (routine) without abnormal findings: Secondary | ICD-10-CM | POA: Diagnosis not present

## 2018-04-26 DIAGNOSIS — G4733 Obstructive sleep apnea (adult) (pediatric): Secondary | ICD-10-CM | POA: Diagnosis not present

## 2018-05-05 DIAGNOSIS — M79621 Pain in right upper arm: Secondary | ICD-10-CM | POA: Diagnosis not present

## 2018-05-24 DIAGNOSIS — R197 Diarrhea, unspecified: Secondary | ICD-10-CM | POA: Diagnosis not present

## 2018-05-24 DIAGNOSIS — K529 Noninfective gastroenteritis and colitis, unspecified: Secondary | ICD-10-CM | POA: Diagnosis not present

## 2018-05-30 DIAGNOSIS — R899 Unspecified abnormal finding in specimens from other organs, systems and tissues: Secondary | ICD-10-CM | POA: Diagnosis not present

## 2018-05-30 DIAGNOSIS — R197 Diarrhea, unspecified: Secondary | ICD-10-CM | POA: Diagnosis not present

## 2018-07-21 ENCOUNTER — Ambulatory Visit (INDEPENDENT_AMBULATORY_CARE_PROVIDER_SITE_OTHER): Payer: BLUE CROSS/BLUE SHIELD

## 2018-07-21 ENCOUNTER — Ambulatory Visit (INDEPENDENT_AMBULATORY_CARE_PROVIDER_SITE_OTHER): Payer: BLUE CROSS/BLUE SHIELD | Admitting: Sports Medicine

## 2018-07-21 ENCOUNTER — Encounter: Payer: Self-pay | Admitting: Sports Medicine

## 2018-07-21 VITALS — BP 144/96 | HR 101 | Ht 68.0 in | Wt 332.8 lb

## 2018-07-21 DIAGNOSIS — M5020 Other cervical disc displacement, unspecified cervical region: Secondary | ICD-10-CM

## 2018-07-21 DIAGNOSIS — M47812 Spondylosis without myelopathy or radiculopathy, cervical region: Secondary | ICD-10-CM | POA: Diagnosis not present

## 2018-07-21 MED ORDER — PREGABALIN 75 MG PO CAPS: 75.0000 mg | ORAL_CAPSULE | Freq: Two times a day (BID) | ORAL | 2 refills | Status: DC

## 2018-07-21 MED ORDER — METHYLPREDNISOLONE 4 MG PO TBPK: ORAL_TABLET | ORAL | 0 refills | Status: DC

## 2018-07-21 NOTE — Patient Instructions (Addendum)
We are ordering an MRI for you today.  The imaging office will be calling you to schedule your appointment after we obtain authorization from your insurance company.   Please be sure you have signed up for MyChart so that we can get your results to you.  We will be in touch with you as soon as we can.  Please know, it can take up to 3-4 business days for the radiologist and Dr. Rigby to have time to review the results and determine the best appropriate action.  If there is something that appears to be surgical or needs a referral to other specialists we will let you know through MyChart or telephone.  Otherwise we will plan to schedule a follow up appointment with Dr. Rigby once we have the results.    [facility]: [phone]  

## 2018-07-23 NOTE — Progress Notes (Signed)
Suzanne Wilcox. Suzanne Wilcox [phone]  Suzanne Wilcox - 43 y.o. female MRN [id number]  Date of birth: [birth year]  Visit Date: 07/21/2018  PCP: Maurice Small, MD   Referred by: Maurice Small, MD  SUBJECTIVE:   Chief Complaint  Patient presents with  . Initial Assessment    R shoulder, elbow and wrist pain.  L thumb pain    HPI: Patient presents for the above symptoms.  She is having worsening symptoms over the past few months.  She was never able to go to physical therapy previously due to cost constraints.  She is having worsening pain in bilateral upper extremities worse on the right than the left.  Worse with lifting and picking up things including her young children.  She is having radiation into the right upper extremity as well as weakness.  The pain currently is rated as severe.  Tramadol has only been that helpful. This is keeping her awake at night she is also nighttime disturbances from having young children at home.  Her activity level has significantly decreased.  She does report falling and landing on her right hip and back while walking onto her garage last week.  She is having headaches as well as dizziness.  She is having bilateral numbness and tingling.  Recently diagnosed with shingles in 2019 this seems different.  REVIEW OF SYSTEMS: Per HPI  HISTORY:  Prior history reviewed and updated per electronic medical record.  Social History   Occupational History  . Not on file  Tobacco Use  . Smoking status: Former Smoker    Packs/day: 2.00    Years: 18.00    Pack years: 36.00    Types: Cigarettes    Last attempt to quit: 04/29/2009    Years since quitting: 9.2  . Smokeless tobacco: Never Used  Substance and Sexual Activity  . Alcohol use: No  . Drug use: No  . Sexual activity: Not on file   Social History   Social History Narrative  . Not on file    OBJECTIVE:  VS:  HT:5\' 8"  (172.7  cm)   WT:(!) 332 lb 12.8 oz (151 kg)  BMI:50.61    BP:(!) 144/96  HR:(!) 101bpm  TEMP: ( )  RESP:99 %   PHYSICAL EXAM: Adult obese female.  In no acute respiratory distress but she is slightly withdrawn and seems to be overwhelmed in general. She has pain with Spurling's compression test as well as Lhermitte's compression test.  Axial load of the neck causes no significant radicular pain.  There is no reproduction of symptoms with Spurling's or Lhermitte's compression test radiating into the arm she does have pain.  Pain with arm squeeze test on the right greater than left. Bilateral upper extremities have well-maintained strength with no side to side differences although she might have slight decrease in her right-handed grip.  Upper extremity reflexes are symmetric and normal.   ASSESSMENT   1. Herniated cervical intervertebral disc      PROCEDURES:  None  PLAN:  Pertinent additional documentation may be included in corresponding procedure notes, imaging studies, problem based documentation and patient instructions.  Symptoms are consistent with a likely worsening herniation of C7 disc and associated C8 radiculitis. We discussed multiple options for and ultimately will try to improve her symptoms with Medrol Dosepak and Lyrica but ultimately with how long this been going on for any worsening features now including bilateral upper extremities repeat MRI  of the cervical spine indicated.  Fortunately she has been trying home therapeutic exercises but is unable to participate in physical therapy due to cost constraints and time limitations.  Activity modifications and the importance of avoiding exacerbating activities (limiting pain to no more than a 4 / 10 during or following activity) recommended and discussed. Discussed red flag symptoms that warrant earlier emergent evaluation and patient voices understanding. >50% of this 25 minutes minute visit spent in direct patient counseling  and/or coordination of care. Discussion was focused on education regarding the in discussing the pathoetiology and anticipated clinical course of the above condition.  Meds ordered this encounter  Medications  . pregabalin (LYRICA) 75 MG capsule    Sig: Take 1 capsule (75 mg total) by mouth 2 (two) times daily.    Dispense:  60 capsule    Refill:  2  . methylPREDNISolone (MEDROL DOSEPAK) 4 MG TBPK tablet    Sig: Take by mouth as directed. Take 6 tablets on the first day prescribed then as directed.    Dispense:  21 tablet    Refill:  0   Lab Orders  No laboratory test(s) ordered today   Imaging Orders     DG Cervical Spine 2 or 3 views     MR Cervical Spine Wo Contrast    Return for MRI review in office.          Andrena MewsMichael D , DO    [facility]

## 2018-07-25 ENCOUNTER — Telehealth: Payer: Self-pay | Admitting: Sports Medicine

## 2018-07-25 NOTE — Telephone Encounter (Signed)
See note  Copied from CRM [id number]. Topic: General - Other >> Jul 25, 2018 10:30 AM Gean Birchwood R wrote: Patient called in for xray results.

## 2018-07-25 NOTE — Telephone Encounter (Signed)
IMPRESSION: 1. Progressed degenerative change of the cervical spine resulting in moderate to severe C5-6 degenerative disc. 2. No fracture deformity or malalignment.  Proceed with MRI

## 2018-07-25 NOTE — Telephone Encounter (Signed)
Called pt and advised. She wanted to let Dr. [name] know that she was working on stripping electrical wire over the weekend and grabbed something wrong and feels like this caused a set back. Dr. [name] made verbally aware. PA for MRI has been obtained and order sent to [facility].

## 2018-07-26 ENCOUNTER — Encounter (HOSPITAL_COMMUNITY): Payer: Self-pay

## 2018-07-26 ENCOUNTER — Ambulatory Visit: Payer: Self-pay

## 2018-07-26 ENCOUNTER — Emergency Department (HOSPITAL_COMMUNITY)
Admission: EM | Admit: 2018-07-26 | Discharge: 2018-07-26 | Disposition: A | Discharge status: Home / Self Care | Payer: BLUE CROSS/BLUE SHIELD | Attending: Emergency Medicine | Admitting: Emergency Medicine

## 2018-07-26 DIAGNOSIS — R410 Disorientation, unspecified: Secondary | ICD-10-CM | POA: Insufficient documentation

## 2018-07-26 DIAGNOSIS — T50995A Adverse effect of other drugs, medicaments and biological substances, initial encounter: Secondary | ICD-10-CM | POA: Insufficient documentation

## 2018-07-26 DIAGNOSIS — H538 Other visual disturbances: Secondary | ICD-10-CM | POA: Diagnosis not present

## 2018-07-26 DIAGNOSIS — R202 Paresthesia of skin: Secondary | ICD-10-CM | POA: Insufficient documentation

## 2018-07-26 DIAGNOSIS — Z87891 Personal history of nicotine dependence: Secondary | ICD-10-CM | POA: Diagnosis not present

## 2018-07-26 DIAGNOSIS — Z79899 Other long term (current) drug therapy: Secondary | ICD-10-CM | POA: Insufficient documentation

## 2018-07-26 DIAGNOSIS — T50905A Adverse effect of unspecified drugs, medicaments and biological substances, initial encounter: Secondary | ICD-10-CM | POA: Diagnosis not present

## 2018-07-26 DIAGNOSIS — T887XXA Unspecified adverse effect of drug or medicament, initial encounter: Secondary | ICD-10-CM

## 2018-07-26 NOTE — ED Notes (Signed)
Patient verbalizes understanding of discharge instructions. Opportunity for questioning and answers were provided. Armband removed by staff, pt discharged from ED.  

## 2018-07-26 NOTE — ED Provider Notes (Signed)
[facility] EMERGENCY DEPARTMENT Provider Note   CSN: [id number] Arrival date & time: 07/26/18  1119     History   Chief Complaint Chief Complaint  Patient presents with  . Medication Reaction    HPI Suzanne Wilcox is a 43 y.o. female.  43 yo F started taking lyrica 5 days ago for neuropathic pain down her right arm.  Started taking it at night.  Started taking her dose twice a day and started feeling "foggy."  Started having tingling to her mouth, lips and tongue.  Having some difficulty concentrating.  Had some transient double vision Feels like when she smokes marijuana.  Had some difficulty with balance initially, but now improved.    The history is provided by the patient.  Illness  Severity:  Mild Onset quality:  Gradual Duration:  5 days Timing:  Constant Progression:  Partially resolved Chronicity:  New Associated symptoms: no chest pain, no congestion, no fever, no headaches, no myalgias, no nausea, no rhinorrhea, no shortness of breath, no vomiting and no wheezing     Past Medical History:  Diagnosis Date  . Anxiety   . Bipolar disorder (HCC)   . Bipolar disorder (HCC)   . Depression   . GERD (gastroesophageal reflux disease)   . Hx of varicella   . Hypertension   . Missed abortions    X 6  . Obesity 04/29/2012  . Postoperative retention of urine 03/28/2016  . Postpartum care following cesarean delivery (9/29) 03/27/2016  . Termination of pregnancy (fetus) 03/22/2004   X 1  . Vaginal Pap smear, abnormal     Patient Active Problem List   Diagnosis Date Noted  . 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 Herniated cervical intervertebral disc 06/28/2017  . Postoperative retention of urine 03/28/2016  . Postpartum care following cesarean delivery (9/29) 03/27/2016  . Delivered by cesarean section 03/27/2016  . Breech birth 05/22/2014  . Bipolar I disorder, most recent episode (or current) depressed, severe, without mention of  psychotic behavior 05/11/2012  . OCD (obsessive compulsive disorder) 05/11/2012  . Substance abuse (HCC) 05/11/2012    Past Surgical History:  Procedure Laterality Date  . CESAREAN SECTION  2011   X 1 [facility] [place]  . CESAREAN SECTION N/A 05/22/2014   Procedure: Repeat CESAREAN SECTION;  Surgeon: Lenoard Aden, MD;  Location: [facility];  Service: Obstetrics;  Laterality: N/A;  EDD: 05/31/14  . CESAREAN SECTION WITH BILATERAL TUBAL LIGATION Bilateral 03/27/2016   Procedure: Repeat CESAREAN SECTION WITH BILATERAL TUBAL LIGATION;  Surgeon: Olivia Mackie, MD;  Location: [facility] SU[facility];  Service: Obstetrics;  Laterality: Bilateral;  EDD: 04/01/16  . CHOLECYSTECTOMY N/A 04/29/2017   Procedure: LAPAROSCOPIC CHOLECYSTECTOMY WITH INTRAOPERATIVE CHOLANGIOGRAM;  Surgeon: Ovidio Kin, MD;  Location: [facility];  Service: General;  Laterality: N/A;  ERAS PATHWAY  . DILATION AND CURETTAGE OF UTERUS   2005, 2010     x2 [facility]  . ESOPHAGOGASTRODUODENOSCOPY N/A 01/11/2017   Procedure: ESOPHAGOGASTRODUODENOSCOPY (EGD);  Surgeon: Kerin Salen, MD;  Location: [facility];  Service: Gastroenterology;  Laterality: N/A;  . IR GENERIC HISTORICAL  03/26/2016   IR FLUORO GUIDE CV LINE RIGHT 03/26/2016 [facility]  . IR GENERIC HISTORICAL  03/26/2016   IR US GUIDE VASC ACCESS RIGHT 03/26/2016 [facility]  . WISDOM TOOTH EXTRACTION       OB History    Gravida  10   Para  3   Term  3   Preterm  0  AB  7   Living  3     SAB  6   TAB  1   Ectopic  0   Multiple  0   Live Births  3            Home Medications    Prior to Admission medications   Medication Sig Start Date End Date Taking? Authorizing Provider  alprazolam Prudy Feeler) 2 MG tablet Take 0.5 mg by mouth 2 (two) times daily as needed for anxiety.     [provider]  amphetamine-dextroamphetamine (ADDERALL) 30 MG tablet Take 30 mg by mouth daily.    [provider]  aspirin-acetaminophen-caffeine (EXCEDRIN MIGRAINE)  [phone] MG tablet Take 2 tablets by mouth 2 (two) times daily as needed for headache.    [provider]  benazepril (LOTENSIN) 20 MG tablet Take 20 mg by mouth at bedtime.  04/28/16   [provider]  buPROPion (WELLBUTRIN XL) 150 MG 24 hr tablet Take 450 mg by mouth daily.     [provider]  ibuprofen (ADVIL,MOTRIN) 200 MG tablet Take 400 mg by mouth daily as needed for headache or moderate pain.    [provider]  methylPREDNISolone (MEDROL DOSEPAK) 4 MG TBPK tablet Take by mouth as directed. Take 6 tablets on the first day prescribed then as directed. 07/21/18   Andrena Mews, DO  nitroGLYCERIN (NITRODUR - DOSED IN MG/24 HR) 0.2 mg/hr patch Place 1/4 to 1/2 of a patch over affected region. Remove and replace once daily.  Slightly alter skin placement daily Patient not taking: Reported on 07/21/2018 06/14/17   Andrena Mews, DO  nystatin cream (MYCOSTATIN) Apply 1 application topically 2 (two) times daily as needed (yeast).    [provider]  omeprazole (PRILOSEC) 40 MG capsule Take 40 mg by mouth at bedtime.    [provider]  ondansetron (ZOFRAN) 8 MG tablet Take 8 mg by mouth every 8 (eight) hours as needed for nausea or vomiting. Pt has not started medication yet    [provider]  pregabalin (LYRICA) 75 MG capsule Take 1 capsule (75 mg total) by mouth 2 (two) times daily. 07/21/18   Andrena Mews, DO  QUEtiapine (SEROQUEL) 200 MG tablet Take 100-200 mg by mouth at bedtime. Depends on difficulty sleeping    [provider]  traMADol (ULTRAM) 50 MG tablet Take 50 mg by mouth every 6 (six) hours as needed for moderate pain.  05/04/16   [provider]    Family History Family History  Problem Relation Age of Onset  . Depression Mother   . Skin cancer Mother   . Depression Paternal Grandmother   . Pancreatic cancer Father   . Kidney Stones Father   . Stroke Father   . Dementia Father   . Skin  cancer Sister     Social History Social History   Tobacco Use  . Smoking status: Former Smoker    Packs/day: 2.00    Years: 18.00    Pack years: 36.00    Types: Cigarettes    Last attempt to quit: 04/29/2009    Years since quitting: 9.2  . Smokeless tobacco: Never Used  Substance Use Topics  . Alcohol use: No  . Drug use: No     Allergies   Adhesive [tape]   Review of Systems Review of Systems  Constitutional: Negative for chills and fever.  HENT: Negative for congestion and rhinorrhea.   Eyes: Negative for redness and visual  disturbance.  Respiratory: Negative for shortness of breath and wheezing.   Cardiovascular: Negative for chest pain and palpitations.  Gastrointestinal: Negative for nausea and vomiting.  Genitourinary: Negative for dysuria and urgency.  Musculoskeletal: Negative for arthralgias and myalgias.  Skin: Negative for pallor and wound.  Neurological: Negative for dizziness and headaches.     Physical Exam Updated Vital Signs BP (!) 155/84   Pulse 97   Temp 98.4 F (36.9 C) (Oral)   Resp 16   LMP 07/24/2018 (Exact Date)   SpO2 99%   Physical Exam Vitals signs and nursing note reviewed.  Constitutional:      General: She is not in acute distress.    Appearance: She is well-developed. She is not diaphoretic.  HENT:     Head: Normocephalic and atraumatic.  Eyes:     Pupils: Pupils are equal, round, and reactive to light.  Neck:     Musculoskeletal: Normal range of motion and neck supple.  Cardiovascular:     Rate and Rhythm: Normal rate and regular rhythm.     Heart sounds: No murmur. No friction rub. No gallop.   Pulmonary:     Effort: Pulmonary effort is normal.     Breath sounds: No wheezing or rales.  Abdominal:     General: There is no distension.     Palpations: Abdomen is soft.     Tenderness: There is no abdominal tenderness.  Musculoskeletal:        General: No tenderness.  Skin:    General: Skin is warm and dry.    Neurological:     Mental Status: She is alert and oriented to person, place, and time.     Cranial Nerves: Cranial nerves are intact.     Sensory: Sensation is intact.     Motor: Motor function is intact.     Coordination: Coordination is intact.     Gait: Gait is intact.  Psychiatric:        Behavior: Behavior normal.      ED Treatments / Results  Labs (all labs ordered are listed, but only abnormal results are displayed) Labs Reviewed - No data to display  EKG None  Radiology No results found.  Procedures Procedures (including critical care time)  Medications Ordered in ED Medications - No data to display   Initial Impression / Assessment and Plan / ED Course  I have reviewed the triage vital signs and the nursing notes.  Pertinent labs & imaging results that were available during my care of the patient were reviewed by me and considered in my medical decision making (see chart for details).     43 yo F with a cc of foggy headedness, confusion and difficulty with balance after started lyrica.   Patient with benign neuro exam. Suspect medication adverse reaction, though some symptoms sound consistent with panic attack.  Ortho follow up.  7:53 PM:  I have discussed the diagnosis/risks/treatment options with the patient and believe the pt to be eligible for discharge home to follow-up with PCP, Ortho. We also discussed returning to the ED immediately if new or worsening sx occur. We discussed the sx which are most concerning (e.g., sudden worsening weakness, numbness, difficulty with speech, swallowing) that necessitate immediate return. Medications administered to the patient during their visit and any new prescriptions provided to the patient are listed below.  Medications given during this visit Medications - No data to display   The patient appears reasonably screen and/or stabilized for discharge and  I doubt any other medical condition or other [facility] requiring  further screening, evaluation, or treatment in the ED at this time prior to discharge.    Final Clinical Impressions(s) / ED Diagnoses   Final diagnoses:  Medication side effect    ED Discharge Orders    None       Melene PlanFloyd, Jadda Hunsucker, DO 07/26/18 1953

## 2018-07-26 NOTE — Telephone Encounter (Signed)
Pt called to say that she has double vision and blurred vision with a numb tongue.  She has recently been place on Lyrica but has only been taking it at night.  Today she decided to take it as prescribed twice a day and took a morning dose. That's is when her symptoms started. [facility] contacted for approval to schedule appointment at office. Pt is refusing the ED.Per [facility] via Dr [name] pt was told to stop taking the Lyrica and call her PCP. Pt verbalized understanding of instructions.  Reason for Disposition . [1] Blurred vision or visual changes AND [2] present now AND [3] sudden onset or new (e.g., minutes, hours, days)  (Exception: seeing floaters / black specks OR previously diagnosed migraine headaches with same symptoms)    See triage note Pt will seek care from her PCP and stop Lyrica.  Answer Assessment - Initial Assessment Questions 1. DESCRIPTION: "What is the vision loss like? Describe it for me." (e.g., complete vision loss, blurred vision, double vision, floaters, etc.)     Blurred and double vision 2. LOCATION: "One or both eyes?" If one, ask: "Which eye?"     both 3. SEVERITY: "Can you see anything?" If so, ask: "What can you see?" (e.g., fine print)     Cant see fine print 4. ONSET: "When did this begin?" "Did it start suddenly or has this been gradual?"     today 5. PATTERN: "Does this come and go, or has it been constant since it started?"     constant 6. PAIN: "Is there any pain in your eye(s)?"  (Scale 1-10; or mild, moderate, severe)     no 7. CONTACTS-GLASSES: "Do you wear contacts or glasses?"     glasses 8. CAUSE: "What do you think is causing this visual problem?"     lyrica 9. OTHER SYMPTOMS: "Do you have any other symptoms?" (e.g., confusion, headache, arm or leg weakness, speech problems)     Dry mouth dizziness a little slurred tongue is numb 10. PREGNANCY: "Is there any chance you are pregnant?" "When was your last menstrual period?"   No  sunday  Protocols used: VISION LOSS OR CHANGE-A-AH

## 2018-07-26 NOTE — ED Triage Notes (Signed)
Pt sent by orthopedic dr after starting lyrica x 4 days ago. Pt told that she is having reaction to lyrica. Pt endorses headache, lip tingling, facial tingling, occasional blurred vision and "I don't feel veery coordinated" Pt ambulatory. VSS.

## 2018-07-26 NOTE — ED Notes (Signed)
Symptoms of "foggy" feeling in her head, lip and tongue numbness, headache, dizziness, and confusion began at  ~0800 this morning and subsided at 1700.

## 2018-08-02 ENCOUNTER — Telehealth: Payer: Self-pay | Admitting: Sports Medicine

## 2018-08-02 NOTE — Telephone Encounter (Signed)
Last OV 07/21/18 Last refill 07/21/18 #21/0 MRI scheduled for 08/08/2018  Forwarding to Dr. [name].

## 2018-08-03 NOTE — Telephone Encounter (Signed)
Not appropriate for Refill.  One time medication

## 2018-08-05 MED ORDER — PREDNISONE 20 MG PO TABS: ORAL_TABLET | ORAL | 0 refills | Status: DC

## 2018-08-05 NOTE — Telephone Encounter (Signed)
Pt calling to find out why she can't have another steroid.  States that she had an adverse reaction to the Lyrica and needs something for pain states she is having problems working.

## 2018-08-05 NOTE — Addendum Note (Signed)
Addended by: Gaspar Bidding D on: 08/05/2018 12:20 PM   Modules accepted: Orders

## 2018-08-05 NOTE — Telephone Encounter (Signed)
See request °

## 2018-08-05 NOTE — Telephone Encounter (Signed)
Called pt and informed her that a new medication was prescribed.  Asked pt about her MRI and she states that she will be having her MRI on Monday, Feb. 10th.  Scheduled her for a MRI f/u visit on Wednesday, Feb. 19th at 4:20 pm

## 2018-08-05 NOTE — Telephone Encounter (Signed)
Changed Rx to Prednisone taper over 12 days. She needs to get the MRI

## 2018-08-08 ENCOUNTER — Ambulatory Visit
Admission: RE | Admit: 2018-08-08 | Discharge: 2018-08-08 | Disposition: A | Discharge status: Home / Self Care | Payer: BLUE CROSS/BLUE SHIELD | Source: Ambulatory Visit | Attending: Sports Medicine | Admitting: Sports Medicine

## 2018-08-08 DIAGNOSIS — M4802 Spinal stenosis, cervical region: Secondary | ICD-10-CM | POA: Diagnosis not present

## 2018-08-08 DIAGNOSIS — M5020 Other cervical disc displacement, unspecified cervical region: Secondary | ICD-10-CM

## 2018-08-17 ENCOUNTER — Encounter: Payer: Self-pay | Admitting: Sports Medicine

## 2018-08-17 ENCOUNTER — Ambulatory Visit (INDEPENDENT_AMBULATORY_CARE_PROVIDER_SITE_OTHER): Payer: BLUE CROSS/BLUE SHIELD | Admitting: Sports Medicine

## 2018-08-17 DIAGNOSIS — M5020 Other cervical disc displacement, unspecified cervical region: Secondary | ICD-10-CM

## 2018-08-17 MED ORDER — OXYCODONE-ACETAMINOPHEN 5-325 MG PO TABS: 1.0000 | ORAL_TABLET | Freq: Four times a day (QID) | ORAL | 0 refills | Status: DC | PRN

## 2018-08-17 NOTE — Patient Instructions (Addendum)
Also check out [employer]" which is a program developed by Dr. Myles Lipps.   There are links to a couple of his YouTube Videos below and I would like to see you performing one of his videos 5-6 days per week.  It is best to do these exercises first thing in the morning.  They will give you a good jumpstart here today and start normalizing the way you move.  A good intro video is: "Independence from Pain 7-minute Video" - [email or web address].[email or web address]   A more advanced video is: Scientist, research (medical) original 12 minutes" - [email or web address]  Exercises that focus more on the neck are as below: Dr. Derrill Kay with [employer] teaching neck and shoulder details Part 1 - https://youtu.be/[email or web address] Part 2 Dr. Derrill Kay with [facility] quick routine to practice daily - https://youtu.be/[email or web address]  Do not try to attempt the entire video when first beginning.  Try breaking of each exercise that he goes into shorter segments.  In other words, if they perform an exercise for 45 seconds, start with 15 seconds and rest and then resume when they begin the new activity.  If you work your way up to being able to do these videos without having to stop, I expect you will see significant improvements in your pain.  If you enjoy his videos and would like to find out more you can look on his website: [email or web address].  He has a workout streaming option as well as a DVD set available for purchase.  Amazon has the best price for his DVDs.     [facility]: [phone]

## 2018-08-17 NOTE — Progress Notes (Signed)
Suzanne Wilcox. [facility] [phone]  Suzanne Wilcox - 43 y.o. female MRN [id number]  Date of birth: 30-Apr-1976  Visit Date: August 17, 2018  PCP: Maurice Small, MD   Referred by: Maurice Small, MD  SUBJECTIVE:  Chief Complaint  Patient presents with  . Follow-up    neck pain radiating into B UEs (R>L).  Tramadol, prednisone.    HPI: Patient is here for follow-up of her MRI results.  She is continued to have significant pain including nighttime disturbances with this.  Pain does radiate into the left arm more than into the right.  She is completed a steroid Dosepak and had good response to this however soon as she discontinued it her symptoms almost completely returned.  She is unable to go to physical therapy due to cost constraints and time restraints.  She has not been performing any specific therapeutic exercises.  If her extremity weakness has not been progressive.  REVIEW OF SYSTEMS: Per HPI  HISTORY:  Prior history reviewed and updated per electronic medical record.  Patient Active Problem List   Diagnosis Date Noted  . 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 Herniated cervical intervertebral disc 06/28/2017    MRI July 2017-  1. Moderate left C5-6 neural foraminal narrowing, primarily caused by a left foraminal disc protrusion. 2. No other significant spinal canal or neural foraminal stenosis.  C-spine XR - 07/21/18  C-spine MRI - 08/08/18   . Postoperative retention of urine 03/28/2016  . Postpartum care following cesarean delivery (9/29) 03/27/2016  . Delivered by cesarean section 03/27/2016  . Breech birth 05/22/2014  . Bipolar I disorder, most recent episode (or current) depressed, severe, without mention of psychotic behavior 05/11/2012  . OCD (obsessive compulsive disorder) 05/11/2012  . Substance abuse (HCC) 05/11/2012   Social History   Occupational History  . Not on file    Tobacco Use  . Smoking status: Former Smoker    Packs/day: 2.00    Years: 18.00    Pack years: 36.00    Types: Cigarettes    Last attempt to quit: 04/29/2009    Years since quitting: 9.3  . Smokeless tobacco: Never Used  Substance and Sexual Activity  . Alcohol use: No  . Drug use: No  . Sexual activity: Not on file   Social History   Social History Narrative  . Not on file    OBJECTIVE:  VS:  HT:5\' 8"  (172.7 cm)   WT:(!) 331 lb 9.6 oz (150.4 kg)  BMI:50.43    BP:(!) 142/84  HR:93bpm  TEMP: ( )  RESP:97 %   PHYSICAL EXAM: Adult female. No acute distress.  Alert and appropriate. Overall good insight.  She does have marked anterior chain dominance.  Head position is in a neutral position.  She has limited overhead range of motion of the shoulders and nonfocal exam otherwise.   ASSESSMENT:  1. Herniated cervical intervertebral disc     PROCEDURES:  None  PLAN:  Pertinent additional documentation may be included in corresponding procedure notes, imaging studies, problem based documentation and patient instructions.  No problem-specific Assessment & Plan notes found for this encounter.   Long discussion today regarding options.  Ultimately the forces around her neck have led to advanced for age degenerative changes.  We discussed this is both a force coupling as well as the overall total force issue and weight loss in conjunction with formal therapeutic exercises is critical for  moving forward either way in spite of what ever intervention is required to help relieve her symptoms.  We will refer her for epidural steroid injection.  If any lack of improvement neurosurgical evaluation will be warranted however I do have concerns that for poor surgical outcome given her morbid obesity.  Links to [employer] provided today per Patient Instructions.  These exercises were developed by Myles Lipps, DC with a strong emphasis on core neuromuscular reducation and  postural realignment through body-weight exercises.  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  She is aware of this and understands that the Percocet prescribed today is for intermittent use only.  Activity modifications and the importance of avoiding exacerbating activities (limiting pain to no more than a 4 / 10 during or following activity) recommended and discussed.  Discussed red flag symptoms that warrant earlier emergent evaluation and patient voices understanding.  >50% of this 25 minutes minute visit spent in direct patient counseling and/or coordination of care. Discussion was focused on education regarding the in discussing the pathoetiology and anticipated clinical course of the above condition.   Meds ordered this encounter  Medications  . oxyCODONE-acetaminophen (PERCOCET) 5-325 MG tablet    Sig: Take 1 tablet by mouth every 6 (six) hours as needed for severe pain.    Dispense:  20 tablet    Refill:  0   Lab Orders  No laboratory test(s) ordered today    Imaging Orders     DG INJECT DIAG/THERA/INC NEEDLE/CATH/PLC EPI/CERV/THOR W/IMG Referral Orders  No referral(s) requested today    Return in about 8 weeks (around 10/12/2018).  For repeat clinical exam         Andrena Mews, DO    [place] [facility] Phys[facility]

## 2018-08-23 ENCOUNTER — Telehealth: Payer: Self-pay | Admitting: Radiology

## 2018-08-23 NOTE — Telephone Encounter (Signed)
Spoke with pt, she advised that she is taking the Oxycodone-APAP every 6-8 hours prn, a little less often than prescribed because of work, and its barely touching the pain. She is scheduled for epidural injection 08/30/2018. She reports that the steroid that she was on before did a better job helping with pain relief then the Oxycodone and was wondering if that would be a possibility.   Forwarding to Dr. [name] to advise.

## 2018-08-23 NOTE — Telephone Encounter (Signed)
Continued steroid use for this condition is not appropriate given the 2 courses previously.  Unfortunately ongoing systemic steroids have significant side effects that can cause multiple comorbidities and the benefit does not outweigh the risk.  Trying to minimize her activity and using heat, ice, and the previously prescribed medications are all that is currently available other than physical therapy.

## 2018-08-23 NOTE — Telephone Encounter (Signed)
See Note.   Copied from CRM [id number]. Topic: General - Other >> Aug 23, 2018  2:50 PM Percival Spanish wrote:  Pain said the pain med is not working and is asking if there is something ,she would like a call back

## 2018-08-24 NOTE — Telephone Encounter (Signed)
Called pt and advised. Pt verbalized understanding and will call back if she needs a refill of pain med prior to her appointment with [facility]. She advised that 08/30/18 was the soonest that they could get her in.

## 2018-08-25 NOTE — Telephone Encounter (Signed)
Last refill 08/17/18 #20/0.   Forwarding to Dr. [name] to advise.

## 2018-08-25 NOTE — Telephone Encounter (Signed)
Pt called in to make provider [name] aware that she actually does need a refill on medication ( Oxycodone-APAP)  and would like to know if he could make Rx stronger. Pt says that her current strength is barely helping.    CB: [phone]

## 2018-08-25 NOTE — Telephone Encounter (Signed)
See note

## 2018-08-26 MED ORDER — OXYCODONE-ACETAMINOPHEN 5-325 MG PO TABS: 1.0000 | ORAL_TABLET | Freq: Four times a day (QID) | ORAL | 0 refills | Status: DC | PRN

## 2018-08-26 NOTE — Telephone Encounter (Signed)
New Rx sent. PDMP checked. Ensure no more Tramadol fills.  Caution with combining Xanax and pain medications

## 2018-08-26 NOTE — Addendum Note (Signed)
Addended by: Gaspar Bidding D on: 08/26/2018 12:07 PM   Modules accepted: Orders

## 2018-08-26 NOTE — Telephone Encounter (Signed)
Called pt and advised of refill and new directions. Also advised, per Dr. [name], OK to take 400 mg IBU between doses of pain med if needed.

## 2018-08-30 ENCOUNTER — Ambulatory Visit
Admission: RE | Admit: 2018-08-30 | Discharge: 2018-08-30 | Disposition: A | Discharge status: Home / Self Care | Payer: BLUE CROSS/BLUE SHIELD | Source: Ambulatory Visit | Attending: Sports Medicine | Admitting: Sports Medicine

## 2018-08-30 DIAGNOSIS — M5020 Other cervical disc displacement, unspecified cervical region: Secondary | ICD-10-CM

## 2018-08-30 DIAGNOSIS — M47812 Spondylosis without myelopathy or radiculopathy, cervical region: Secondary | ICD-10-CM | POA: Diagnosis not present

## 2018-08-30 MED ORDER — IOPAMIDOL (ISOVUE-M 300) INJECTION 61%
1.0000 mL | Freq: Once | INTRAMUSCULAR | Status: AC | PRN
  Administered 2018-08-30: 1 mL via EPIDURAL

## 2018-08-30 MED ORDER — TRIAMCINOLONE ACETONIDE 40 MG/ML IJ SUSP (RADIOLOGY)
60.0000 mg | Freq: Once | INTRAMUSCULAR | Status: AC
  Administered 2018-08-30: 60 mg via EPIDURAL

## 2018-08-30 NOTE — Discharge Instructions (Signed)

## 2018-09-09 DIAGNOSIS — F3111 Bipolar disorder, current episode manic without psychotic features, mild: Secondary | ICD-10-CM | POA: Diagnosis not present

## 2018-09-09 DIAGNOSIS — F3132 Bipolar disorder, current episode depressed, moderate: Secondary | ICD-10-CM | POA: Diagnosis not present

## 2018-09-09 DIAGNOSIS — F410 Panic disorder [episodic paroxysmal anxiety] without agoraphobia: Secondary | ICD-10-CM | POA: Diagnosis not present

## 2018-10-05 ENCOUNTER — Ambulatory Visit: Payer: BLUE CROSS/BLUE SHIELD | Admitting: Sports Medicine

## 2018-10-20 DIAGNOSIS — F900 Attention-deficit hyperactivity disorder, predominantly inattentive type: Secondary | ICD-10-CM | POA: Diagnosis not present

## 2018-10-20 DIAGNOSIS — F3132 Bipolar disorder, current episode depressed, moderate: Secondary | ICD-10-CM | POA: Diagnosis not present

## 2018-12-01 DIAGNOSIS — D529 Folate deficiency anemia, unspecified: Secondary | ICD-10-CM | POA: Diagnosis not present

## 2018-12-01 DIAGNOSIS — E559 Vitamin D deficiency, unspecified: Secondary | ICD-10-CM | POA: Diagnosis not present

## 2018-12-01 DIAGNOSIS — R7309 Other abnormal glucose: Secondary | ICD-10-CM | POA: Diagnosis not present

## 2018-12-01 DIAGNOSIS — E785 Hyperlipidemia, unspecified: Secondary | ICD-10-CM | POA: Diagnosis not present

## 2018-12-01 DIAGNOSIS — Z79899 Other long term (current) drug therapy: Secondary | ICD-10-CM | POA: Diagnosis not present

## 2018-12-01 DIAGNOSIS — F3132 Bipolar disorder, current episode depressed, moderate: Secondary | ICD-10-CM | POA: Diagnosis not present

## 2018-12-01 DIAGNOSIS — F3111 Bipolar disorder, current episode manic without psychotic features, mild: Secondary | ICD-10-CM | POA: Diagnosis not present

## 2019-01-03 DIAGNOSIS — F3132 Bipolar disorder, current episode depressed, moderate: Secondary | ICD-10-CM | POA: Diagnosis not present

## 2019-01-03 DIAGNOSIS — F900 Attention-deficit hyperactivity disorder, predominantly inattentive type: Secondary | ICD-10-CM | POA: Diagnosis not present

## 2019-01-03 DIAGNOSIS — F3174 Bipolar disorder, in full remission, most recent episode manic: Secondary | ICD-10-CM | POA: Diagnosis not present

## 2019-02-15 ENCOUNTER — Ambulatory Visit (INDEPENDENT_AMBULATORY_CARE_PROVIDER_SITE_OTHER): Payer: BC Managed Care – PPO | Admitting: Family Medicine

## 2019-02-15 ENCOUNTER — Encounter: Payer: Self-pay | Admitting: Family Medicine

## 2019-02-15 DIAGNOSIS — G8929 Other chronic pain: Secondary | ICD-10-CM

## 2019-02-15 DIAGNOSIS — M5441 Lumbago with sciatica, right side: Secondary | ICD-10-CM

## 2019-02-15 MED ORDER — HYDROCODONE-ACETAMINOPHEN 5-325 MG PO TABS: 1.0000 | ORAL_TABLET | Freq: Four times a day (QID) | ORAL | 0 refills | Status: DC | PRN

## 2019-02-15 MED ORDER — PREDNISONE 10 MG PO TABS: ORAL_TABLET | ORAL | 0 refills | Status: DC

## 2019-02-15 MED ORDER — BACLOFEN 10 MG PO TABS: 5.0000 mg | ORAL_TABLET | Freq: Three times a day (TID) | ORAL | 1 refills | Status: DC | PRN

## 2019-02-15 NOTE — Progress Notes (Signed)
Office Visit Note   Patient: Suzanne Wilcox           Date of Birth: 06/05/1976           MRN: [id number] Visit Date: 02/15/2019 Requested by: Maurice SmallGriffin, Elaine, MD [address] [place],  [place] [id number] PCP: Maurice SmallGriffin, Elaine, MD  Subjective: Chief Complaint  Patient presents with  . Lower Back - Pain    Has had sciatica x years. This flareup started 2 days ago. Pain in the right lower back, into the hip and down the leg to the ankle. Some numbness/tingling in the toes of the right foot.    HPI: She is here with low back and right leg pain.  She has had intermittent pain for the past 2 years.  X-rays in 2017 were reviewed on computer this morning showing moderate L4-5 degenerative disc disease.  She had a flareup recently with no injury, 2 days of severe pain going down her leg to the ankle with some tingling in the toes.  Denies bowel or bladder dysfunction.  She cannot find a comfortable position.  In the past she has done well with cervical epidural injections and she wondered whether a steroid injection might help her at this time.              ROS: Denies fevers or chills, denies history of diabetes.  All other systems were reviewed and are negative.  Objective: Vital Signs: There were no vitals taken for this visit.  Physical Exam:  General:  Alert and oriented, in no acute distress. Pulm:  Breathing unlabored. Psy:  Normal mood, congruent affect. Skin: No rashes or skin. Low back: Very tender to palpation in the midline over L4-5 and L5-S1, also tender in the right sciatic notch.  Straight leg raise is negative, lower extremity strength and reflexes are normal except for right EHL which is weak at 4/5 compared to normal strength on the left.  Imaging: None today.  Assessment & Plan: 1.  Chronic low back pain with L5 radiculopathy -Medications given today, referral to Dr. Alvester MorinNewton for consideration of epidural steroid injection.  MRI scan if fails to  improve.     Procedures: No procedures performed  No notes on file     PMFS History: Patient Active Problem List   Diagnosis Date Noted  . 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 Herniated cervical intervertebral disc 06/28/2017  . Postoperative retention of urine 03/28/2016  . Postpartum care following cesarean delivery (9/29) 03/27/2016  . Delivered by cesarean section 03/27/2016  . Breech birth 05/22/2014  . Bipolar I disorder, most recent episode (or current) depressed, severe, without mention of psychotic behavior 05/11/2012  . OCD (obsessive compulsive disorder) 05/11/2012  . Substance abuse (HCC) 05/11/2012   Past Medical History:  Diagnosis Date  . Anxiety   . Bipolar disorder (HCC)   . Bipolar disorder (HCC)   . Depression   . GERD (gastroesophageal reflux disease)   . Hx of varicella   . Hypertension   . Missed abortions    X 6  . Obesity 04/29/2012  . Postoperative retention of urine 03/28/2016  . Postpartum care following cesarean delivery (9/29) 03/27/2016  . Termination of pregnancy (fetus) 03/22/2004   X 1  . Vaginal Pap smear, abnormal     Family History  Problem Relation Age of Onset  . Depression Mother   . Skin cancer Mother   . Depression Paternal Grandmother   . Pancreatic cancer  Father   . Kidney Stones Father   . Stroke Father   . Dementia Father   . Skin cancer Sister     Past Surgical History:  Procedure Laterality Date  . CESAREAN SECTION  2011   X 1 [place]  . CESAREAN SECTION N/A 05/22/2014   Procedure: Repeat CESAREAN SECTION;  Surgeon: Lovenia Kim, MD;  Location: [facility];  Service: Obstetrics;  Laterality: N/A;  EDD: 05/31/14  . CESAREAN SECTION WITH BILATERAL TUBAL LIGATION Bilateral 03/27/2016   Procedure: Repeat CESAREAN SECTION WITH BILATERAL TUBAL LIGATION;  Surgeon: Brien Few, MD;  Location: [facility];  Service: Obstetrics;  Laterality: Bilateral;  EDD: 04/01/16  . CHOLECYSTECTOMY N/A 04/29/2017    Procedure: LAPAROSCOPIC CHOLECYSTECTOMY WITH INTRAOPERATIVE CHOLANGIOGRAM;  Surgeon: Alphonsa Overall, MD;  Location: [facility];  Service: General;  Laterality: N/A;  ERAS PATHWAY  . DILATION AND CURETTAGE OF UTERUS   2005, 2010     x2 [facility]  . ESOPHAGOGASTRODUODENOSCOPY N/A 01/11/2017   Procedure: ESOPHAGOGASTRODUODENOSCOPY (EGD);  Surgeon: Ronnette Juniper, MD;  Location: [facility];  Service: Gastroenterology;  Laterality: N/A;  . IR GENERIC HISTORICAL  03/26/2016   IR FLUORO GUIDE CV LINE RIGHT 03/26/2016 [facility]  . IR GENERIC HISTORICAL  03/26/2016   IR US GUIDE VASC ACCESS RIGHT 03/26/2016 [facility]  . WISDOM TOOTH EXTRACTION     Social History   Occupational History  . Not on file  Tobacco Use  . Smoking status: Former Smoker    Packs/day: 2.00    Years: 18.00    Pack years: 36.00    Types: Cigarettes    Quit date: 04/29/2009    Years since quitting: 9.8  . Smokeless tobacco: Never Used  Substance and Sexual Activity  . Alcohol use: No  . Drug use: No  . Sexual activity: Not on file

## 2019-02-21 ENCOUNTER — Telehealth: Payer: Self-pay | Admitting: Family Medicine

## 2019-02-21 MED ORDER — PREDNISONE 10 MG PO TABS: ORAL_TABLET | ORAL | 0 refills | Status: DC

## 2019-02-21 NOTE — Telephone Encounter (Signed)
Rx sent 

## 2019-02-21 NOTE — Telephone Encounter (Signed)
Please advise 

## 2019-02-21 NOTE — Telephone Encounter (Signed)
I advised the patient another steroid pack was sent in. She will start this new one and add this last 3 days' doses of the previous one to the end of this pack. Advised against taking Advil with this.

## 2019-02-21 NOTE — Telephone Encounter (Signed)
Patient called stating that she is still having issue and wants to know if Dr. Junius Roads will send in some steroid pain medication.  Patient uses [facility] on [address]  CB#[phone].  Thank you.

## 2019-02-23 ENCOUNTER — Telehealth: Payer: Self-pay | Admitting: Family Medicine

## 2019-02-23 NOTE — Telephone Encounter (Signed)
I called the patient. The medications are not helping much anymore. She is losing strength in her right leg with walking. Her pain is more severe first thing in the morning. If she stays on the same medications, she will need refills. The patient is also requesting an MRI of her Lspine.

## 2019-02-23 NOTE — Telephone Encounter (Signed)
Patient called advised the Hydrocodone and Baclofen is not working. Patient asked for a call back as soon as possible. Patient asked if she can get set up for an MRI. The number to contact patient is [phone]

## 2019-02-24 ENCOUNTER — Other Ambulatory Visit: Payer: Self-pay | Admitting: Family Medicine

## 2019-02-24 DIAGNOSIS — M5441 Lumbago with sciatica, right side: Secondary | ICD-10-CM

## 2019-02-24 DIAGNOSIS — G8929 Other chronic pain: Secondary | ICD-10-CM

## 2019-02-24 MED ORDER — HYDROCODONE-ACETAMINOPHEN 5-325 MG PO TABS: 1.0000 | ORAL_TABLET | Freq: Two times a day (BID) | ORAL | 0 refills | Status: DC | PRN

## 2019-02-24 MED ORDER — BACLOFEN 10 MG PO TABS: 5.0000 mg | ORAL_TABLET | Freq: Three times a day (TID) | ORAL | 1 refills | Status: DC | PRN

## 2019-02-24 NOTE — Telephone Encounter (Signed)
Any change in medications for pain? If not, can she get refills on the baclofen and the norco?

## 2019-02-24 NOTE — Telephone Encounter (Signed)
Can double the baclofen, but not the norco.  Needs to wean off the norco.

## 2019-02-24 NOTE — Telephone Encounter (Signed)
I advised her on the usage of both medications. She will try heat and ice to see which feels better and apply 15 - 20 minutes prn. She will await calls from PT and Dr. Romona Curls scheduler for the [facility].

## 2019-02-24 NOTE — Telephone Encounter (Signed)
I called and advised the patient of the PT referral and the refills on her meds. She is asking if it is possible to double up on either the baclofen or the norco, due to the pain being so bad. She is still on the prednisone taper.

## 2019-02-24 NOTE — Telephone Encounter (Signed)
Rx sent 

## 2019-02-24 NOTE — Telephone Encounter (Signed)
Insurance will not approve MRI until she's tried conservative treatment for at least 6 weeks.  I placed referral to PT at [facility].  Can also proceed with ESI as already ordered.

## 2019-03-10 ENCOUNTER — Telehealth: Payer: Self-pay | Admitting: Radiology

## 2019-03-10 NOTE — Telephone Encounter (Signed)
Please see below and advise.

## 2019-03-10 NOTE — Telephone Encounter (Signed)
Patient left voicemail on triage.  Patient of Dr. Junius Roads, is scheduled for a back injection by Dr. Ernestina Patches on [place]. 9/15. States sciatica is worse, increased weakness in right leg with pins/needles in foot, also seems to have involuntary movement of right side now.  Can Dr. Lorin Mercy please advise? Dr. Junius Roads is not in office this afternoon.

## 2019-03-10 NOTE — Telephone Encounter (Signed)
I called discussed.  

## 2019-03-14 ENCOUNTER — Other Ambulatory Visit: Payer: Self-pay | Admitting: Physician Assistant

## 2019-03-14 ENCOUNTER — Encounter: Payer: Self-pay | Admitting: Physical Medicine and Rehabilitation

## 2019-03-14 ENCOUNTER — Ambulatory Visit: Payer: Self-pay

## 2019-03-14 ENCOUNTER — Ambulatory Visit (INDEPENDENT_AMBULATORY_CARE_PROVIDER_SITE_OTHER): Payer: BC Managed Care – PPO | Admitting: Physical Medicine and Rehabilitation

## 2019-03-14 DIAGNOSIS — M5416 Radiculopathy, lumbar region: Secondary | ICD-10-CM

## 2019-03-14 DIAGNOSIS — R202 Paresthesia of skin: Secondary | ICD-10-CM

## 2019-03-14 DIAGNOSIS — R2689 Other abnormalities of gait and mobility: Secondary | ICD-10-CM

## 2019-03-14 MED ORDER — METHYLPREDNISOLONE ACETATE 80 MG/ML IJ SUSP
80.0000 mg | Freq: Once | INTRAMUSCULAR | Status: AC
  Administered 2019-03-14: 80 mg

## 2019-03-14 NOTE — Progress Notes (Signed)
Numeric Pain Rating Scale and Functional Assessment Average Pain 4-5    In the last MONTH (on 0-10 scale) has pain interfered with the following?  1. General activity like being  able to carry out your everyday physical activities such as walking, climbing stairs, carrying groceries, or moving a chair? yes Rating  9-10   +Driver, -BT, -Dye Allergies.

## 2019-03-16 ENCOUNTER — Encounter: Payer: Self-pay | Admitting: Neurology

## 2019-03-24 ENCOUNTER — Encounter: Payer: Self-pay | Admitting: Neurology

## 2019-03-26 NOTE — Progress Notes (Signed)
NEUROLOGY CONSULTATION NOTE  [name] MRN: [id number] DOB: December 29, 1975  Referring provider: Milus Height, PA-C Primary care provider: Maurice Small, MD  Reason for consult:  Balance problems, slurred speech  HISTORY OF PRESENT ILLNESS:   Suzanne Wilcox is a 43 year old right-handed white female who presents for balance problems.  History supplemented by referring provider note.  She has history of chronic neck and back pain.  She began having gradual onset of bilateral upper extremity pain in 2019, right worse than left.  She noted right shoulder pain radiating down the arm, as well as associated weakness as well as bilateral numbness.    She saw Sports Medicine in January 2020, where she was diagnosed with a herniated C7 cervical disc with associated C8 radiculitis.  She was prescribed a Medrol dosepak and started on Lyrica.  A few days later, she began feeling foggy-headed with some gait difficulty.  On 07/26/2018, she presented to the ED with onset of perioral paresthesias, as well as involvement of her tongue.  Symptoms thought to be secondary to medication effect of the Lyrica, as well as possible associated panic attack.  MRI of cervical spine without contrast from 08/08/2018 was personally reviewed and showed degenerative disc osteophyte at C5-6 causing bilateral moderate neural foraminal stneosis, mild disc bulge at C4-5 with moderate left and mild right C5 neural foraminal stenosis and right sided uncovertebral hypertrophy at C3-4 causing mild right C4 neural foraminal stenosis but no significant spinal canal stenosis or abnormality of the spinal cord.  She underwent left C7-T1 epidural injection.  She also has chronic low back pain with right sided sciatica, which flared up in August 2020.  At the time, she endorsed worsening right leg weakness and numbness.  She saw orthopedics and pain management.  She failed medications such as hydrocodone and baclofen.  She also failed  physical therapy.  She underwent right lower lumbar epidural on 03/14/2019 for her radiculopathy, which helped.    She has Bipolar disorder and developed psychosis about 6 weeks ago and again developed some of her previous symptoms.  She also developed bilateral blurred vision, worse in the right, as well as eye twitching.  Whenever she would see objects, it looked like they were moving towards her.  Sometimes she says she gets tongue-tied when she speaks.  She reports new left sided tongue numbness.  She reports two prior remote episodes of left sided Bell's palsy.  She states she stumbles at time.  She will walk fine and may briefly stumble.  She no longer had numbness in the hands but feels that her hands are weak.  Sometimes when people talk to her, she has trouble processing what they are saying and doesn't understand.  Her psychiatrist believed that she may have had lithium toxicity.  Labs reportedly showed elevated Cr of 1.45 and GFR 40.  She previously had She was subsequently taken off of the lithium a week ago Saturday.  MRI of brain with and without contrast on 03/22/2019 was normal.  PAST MEDICAL HISTORY: Past Medical History:  Diagnosis Date   Anxiety    Bipolar disorder (HCC)    Bipolar disorder (HCC)    Depression    GERD (gastroesophageal reflux disease)    Hx of varicella    Hypertension    Missed abortions    X 6   Obesity 04/29/2012   Postoperative retention of urine 03/28/2016   Postpartum care following cesarean delivery (9/29) 03/27/2016   Termination of pregnancy (fetus)  03/22/2004   X 1   Vaginal Pap smear, abnormal     PAST SURGICAL HISTORY: Past Surgical History:  Procedure Laterality Date   CESAREAN SECTION  2011   X 1 [facility]   CESAREAN SECTION N/A 05/22/2014   Procedure: Repeat CESAREAN SECTION;  Surgeon: Lenoard Adenichard J Taavon, MD;  Location: [facility];  Service: Obstetrics;  Laterality: N/A;  EDD: 05/31/14   CESAREAN SECTION WITH BILATERAL TUBAL  LIGATION Bilateral 03/27/2016   Procedure: Repeat CESAREAN SECTION WITH BILATERAL TUBAL LIGATION;  Surgeon: Olivia Mackieichard Taavon, MD;  [facility];  Service: Obstetrics;  Laterality: Bilateral;  EDD: 04/01/16   CHOLECYSTECTOMY N/A 04/29/2017   Procedure: LAPAROSCOPIC CHOLECYSTECTOMY WITH INTRAOPERATIVE CHOLANGIOGRAM;  Surgeon: Ovidio KinNewman, David, MD;  Location: [facility];  Service: General;  Laterality: N/A;  ERAS PATHWAY   DILATION AND CURETTAGE OF UTERUS   2005, 2010     x2 MAB   ESOPHAGOGASTRODUODENOSCOPY N/A 01/11/2017   Procedure: ESOPHAGOGASTRODUODENOSCOPY (EGD);  Surgeon: Kerin SalenKarki, Arya, MD;  [facility];  Service: Gastroenterology;  Laterality: N/A;   IR GENERIC HISTORICAL  03/26/2016   IR FLUORO GUIDE CV LINE RIGHT 03/26/2016 [facility]   IR GENERIC HISTORICAL  03/26/2016   IR US GUIDE VASC ACCESS RIGHT 03/26/2016 [facility]   WISDOM TOOTH EXTRACTION      MEDICATIONS: Current Outpatient Medications on File Prior to Visit  Medication Sig Dispense Refill   alprazolam (XANAX) 2 MG tablet Take 0.5 mg by mouth 2 (two) times daily as needed for anxiety.      amantadine (SYMMETREL) 100 MG capsule Take 100 mg by mouth 2 (two) times daily.     Amantadine HCl 100 MG tablet Take 100 mg by mouth 2 (two) times daily.     amphetamine-dextroamphetamine (ADDERALL XR) 20 MG 24 hr capsule Take 20 mg by mouth daily.     amphetamine-dextroamphetamine (ADDERALL) 30 MG tablet Take 30 mg by mouth daily.     aspirin-acetaminophen-caffeine (EXCEDRIN MIGRAINE) 250-250-65 MG tablet Take 2 tablets by mouth 2 (two) times daily as needed for headache.     baclofen (LIORESAL) 10 MG tablet Take 0.5-1 tablets (5-10 mg total) by mouth 3 (three) times daily as needed for muscle spasms. 30 each 1   benazepril (LOTENSIN) 20 MG tablet Take 20 mg by mouth at bedtime.      buPROPion (WELLBUTRIN XL) 150 MG 24 hr tablet Take 450 mg by mouth daily.      diclofenac (VOLTAREN) 50 MG EC tablet Take 50  mg by mouth 2 (two) times daily.     furosemide (LASIX) 20 MG tablet      hydrochlorothiazide (HYDRODIURIL) 25 MG tablet Take 25 mg by mouth daily.     HYDROcodone-acetaminophen (NORCO/VICODIN) 5-325 MG tablet Take 1 tablet by mouth 2 (two) times daily as needed for moderate pain. 15 tablet 0   ibuprofen (ADVIL,MOTRIN) 200 MG tablet Take 400 mg by mouth daily as needed for headache or moderate pain.     LITHIUM CARBONATE ER PO Take 450 mg by mouth at bedtime.     omeprazole (PRILOSEC) 40 MG capsule Take 40 mg by mouth at bedtime.     ondansetron (ZOFRAN) 8 MG tablet Take 8 mg by mouth every 8 (eight) hours as needed for nausea or vomiting. Pt has not started medication yet     oxyCODONE-acetaminophen (PERCOCET) 5-325 MG tablet Take 1-2 tablets by mouth every 6 (six) hours as needed for severe pain. 90 tablet 0   predniSONE (DELTASONE)  10 MG tablet Take as directed for 12 days.  Daily dose 6,6,5,5,4,4,3,3,2,2,1,1. 42 tablet 0   predniSONE (DELTASONE) 10 MG tablet Take as directed for 12 days.  Daily dose 6,6,5,5,4,4,3,3,2,2,1,1. 42 tablet 0   propranolol (INDERAL) 20 MG tablet Take 20 mg by mouth 3 (three) times daily.     QUEtiapine (SEROQUEL XR) 300 MG 24 hr tablet Take 150 mg by mouth at bedtime.     traMADol (ULTRAM) 50 MG tablet Take 50 mg by mouth every 6 (six) hours as needed for moderate pain.      No current facility-administered medications on file prior to visit.     ALLERGIES: Allergies  Allergen Reactions   Adhesive [Tape] Rash   Lyrica [Pregabalin] Photosensitivity    FAMILY HISTORY: Family History  Problem Relation Age of Onset   Depression Mother    Skin cancer Mother    Depression Paternal Grandmother    Pancreatic cancer Father    Kidney Stones Father    Stroke Father    Dementia Father    Skin cancer Sister   .  SOCIAL HISTORY: Social History   Socioeconomic History   Marital status: Significant Other    Spouse name: Not on file    Number of children: Not on file   Years of education: Not on file   Highest education level: Not on file  Occupational History   Not on file  Social Needs   Financial resource strain: Not on file   Food insecurity    Worry: Not on file    Inability: Not on file   Transportation needs    Medical: Not on file    Non-medical: Not on file  Tobacco Use   Smoking status: Former Smoker    Packs/day: 2.00    Years: 18.00    Pack years: 36.00    Types: Cigarettes    Quit date: 04/29/2009    Years since quitting: 9.9   Smokeless tobacco: Never Used  Substance and Sexual Activity   Alcohol use: No   Drug use: No   Sexual activity: Not on file  Lifestyle   Physical activity    Days per week: Not on file    Minutes per session: Not on file   Stress: Not on file  Relationships   Social connections    Talks on phone: Not on file    Gets together: Not on file    Attends religious service: Not on file    Active member of club or organization: Not on file    Attends meetings of clubs or organizations: Not on file    Relationship status: Not on file   Intimate partner violence    Fear of current or ex partner: Not on file    Emotionally abused: Not on file    Physically abused: Not on file    Forced sexual activity: Not on file  Other Topics Concern   Not on file  Social History Narrative   Right handed   One story home   4 children   Drinks caffeine 1-2 cups a day    REVIEW OF SYSTEMS: Constitutional: No fevers, chills, or sweats, no generalized fatigue, change in appetite Eyes: No visual changes, double vision, eye pain Ear, nose and throat: No hearing loss, ear pain, nasal congestion, sore throat Cardiovascular: No chest pain, palpitations Respiratory:  No shortness of breath at rest or with exertion, wheezes GastrointestinaI: No nausea, vomiting, diarrhea, abdominal pain, fecal incontinence Genitourinary:  No dysuria,  urinary retention or  frequency Musculoskeletal:  No neck pain, back pain Integumentary: No rash, pruritus, skin lesions Neurological: as above Psychiatric: No depression, insomnia, anxiety Endocrine: No palpitations, fatigue, diaphoresis, mood swings, change in appetite, change in weight, increased thirst Hematologic/Lymphatic:  No purpura, petechiae. Allergic/Immunologic: no itchy/runny eyes, nasal congestion, recent allergic reactions, rashes  PHYSICAL EXAM: Blood pressure (!) 150/97, pulse 80, temperature 98.6 F (37 C), height 5\' 8"  (1.727 m), weight (!) 344 lb (156 kg), SpO2 97 %. General: No acute distress.  Patient appears well-groomed.   Head:  Normocephalic/atraumatic Eyes:  fundi examined but not visualized Neck: supple, no paraspinal tenderness, full range of motion Back: No paraspinal tenderness Heart: regular rate and rhythm Lungs: Clear to auscultation bilaterally. Vascular: No carotid bruits. Neurological Exam: Mental status: alert and oriented to person, place, and time, recent and remote memory intact, fund of knowledge intact, attention and concentration intact, speech fluent and not dysarthric, language intact. Cranial nerves: CN I: not tested CN II: pupils equal, round and reactive to light, visual fields intact CN III, IV, VI:  full range of motion, no nystagmus, no ptosis CN V: endorses mild decreased sensation over right side of her forehead CN VII: upper and lower face symmetric CN VIII: hearing intact CN IX, X: gag intact, uvula midline CN XI: sternocleidomastoid and trapezius muscles intact CN XII: tongue midline Bulk & Tone: normal, no fasciculations. Motor:  Slight decreased grip in left hand, however testing of individual intrinsic hand muscles intact.  Otherwise 5/5 throughout  Sensation:  Pinprick and vibration sensation intact. Deep Tendon Reflexes:  2+ throughout, toes downgoing.  Finger to nose testing:  Without dysmetria.  Heel to shin:  Without dysmetria.  Gait:   Normal station and stride.  Able to turn and tandem walk. Romberg negative.  IMPRESSION: Multiple symptomatology.  I don't suspect any primary neurologic etiology.  Likely medication effect or related to her underlying psychiatric condition.  Neurologic exam is overall unremarkable except for subjective mild numbness over right side of forehead and slight weakness in left hand grip, which may be related to cervical radiculopathy, however she now denies pain and numbness and testing of individual intrinsic hand muscles intact.  MRI of brain unremarkable.  Thank you for allowing me to take part in the care of this patient.  Metta Clines, DO  CC:  Kelton Pillar, MD  Lennie Odor, PA-C

## 2019-03-27 ENCOUNTER — Ambulatory Visit: Payer: BC Managed Care – PPO | Admitting: Neurology

## 2019-03-27 ENCOUNTER — Other Ambulatory Visit: Payer: Self-pay

## 2019-03-27 ENCOUNTER — Encounter: Payer: Self-pay | Admitting: Neurology

## 2019-03-27 ENCOUNTER — Telehealth (INDEPENDENT_AMBULATORY_CARE_PROVIDER_SITE_OTHER): Payer: BC Managed Care – PPO | Admitting: Neurology

## 2019-03-27 VITALS — BP 150/97 | HR 80 | Temp 98.6°F | Ht 68.0 in | Wt 344.0 lb

## 2019-03-27 DIAGNOSIS — R479 Unspecified speech disturbances: Secondary | ICD-10-CM

## 2019-03-27 DIAGNOSIS — R2681 Unsteadiness on feet: Secondary | ICD-10-CM

## 2019-03-27 DIAGNOSIS — R200 Anesthesia of skin: Secondary | ICD-10-CM

## 2019-03-27 NOTE — Patient Instructions (Signed)
I don't suspect any primary neurologic cause for your symptoms.  Likely medication effect.  Your neurologic exam is unremarkable.

## 2019-04-02 NOTE — Progress Notes (Signed)
Suzanne Wilcox - 43 y.o. female MRN [id number]  Date of birth: [birth year]  Office Visit Note: Visit Date: 03/14/2019 PCP: Kelton Pillar, MD Referred by: Kelton Pillar, MD  Subjective: Chief Complaint  Patient presents with  . Lower Back - Pain   HPI:  Suzanne Wilcox is a 43 y.o. female who comes in today At the request of Dr. Legrand Como hilts for right L5-S1 interlaminar epidural steroid injection diagnostically hopefully therapeutically.  She has had several weeks of right radicular type leg pain and sciatica.  She has had a history of sciatica over the years.  She has had therapy and medication management and has had physical therapy in the past.  More recently she had cervical disc herniation and cervical epidural injection at [facility] imaging.  She was treated at that time by Dr. Teresa Coombs.  We will complete diagnostic L5-S1 interlaminar injection with fluoroscopic guidance.  If she does not get much relief for its very fleeting and I would suggest MRI of the lumbar spine.  ROS Otherwise per HPI.  Assessment & Plan: Visit Diagnoses:  1. Lumbar radiculopathy     Plan: No additional findings.   Meds & Orders:  Meds ordered this encounter  Medications  . methylPREDNISolone acetate (DEPO-MEDROL) injection 80 mg    Orders Placed This Encounter  Procedures  . XR C-ARM NO REPORT  . Epidural Steroid injection    Follow-up: Return if symptoms worsen or fail to improve.   Procedures: No procedures performed  Lumbar Epidural Steroid Injection - Interlaminar Approach with Fluoroscopic Guidance  Patient: Suzanne Wilcox      Date of Birth: April 01, 1976 MRN: [id number] PCP: Kelton Pillar, MD      Visit Date: 03/14/2019   Universal Protocol:     Consent Given By: the patient  Position: PRONE  Additional Comments: Vital signs were monitored before and after the procedure. Patient was prepped and draped in the usual sterile fashion. The correct  patient, procedure, and site was verified.   Injection Procedure Details:  Procedure Site One Meds Administered:  Meds ordered this encounter  Medications  . methylPREDNISolone acetate (DEPO-MEDROL) injection 80 mg     Laterality: Right  Location/Site:  L5-S1  Needle size: 20 G  Needle type: Tuohy  Needle Placement: Paramedian epidural  Findings:   -Comments: Excellent flow of contrast into the epidural space.  Procedure Details: Using a paramedian approach from the side mentioned above, the region overlying the inferior lamina was localized under fluoroscopic visualization and the soft tissues overlying this structure were infiltrated with 4 ml. of 1% Lidocaine without Epinephrine. The Tuohy needle was inserted into the epidural space using a paramedian approach.   The epidural space was localized using loss of resistance along with lateral and bi-planar fluoroscopic views.  After negative aspirate for air, blood, and CSF, a 2 ml. volume of Isovue-250 was injected into the epidural space and the flow of contrast was observed. Radiographs were obtained for documentation purposes.    The injectate was administered into the level noted above.   Additional Comments:  The patient tolerated the procedure well Dressing: 2 x 2 sterile gauze and Band-Aid    Post-procedure details: Patient was observed during the procedure. Post-procedure instructions were reviewed.  Patient left the clinic in stable condition.   Clinical History: No specialty comments available.     Objective:  VS:  HT:    WT:   BMI:     BP:   HR: bpm  TEMP: ( )  RESP:  Physical Exam  Ortho Exam Imaging: No results found.

## 2019-04-02 NOTE — Procedures (Signed)
Lumbar Epidural Steroid Injection - Interlaminar Approach with Fluoroscopic Guidance  Patient: Suzanne Wilcox      Date of Birth: April 07, 1976 MRN: [id number] PCP: Kelton Pillar, MD      Visit Date: 03/14/2019   Universal Protocol:     Consent Given By: the patient  Position: PRONE  Additional Comments: Vital signs were monitored before and after the procedure. Patient was prepped and draped in the usual sterile fashion. The correct patient, procedure, and site was verified.   Injection Procedure Details:  Procedure Site One Meds Administered:  Meds ordered this encounter  Medications  . methylPREDNISolone acetate (DEPO-MEDROL) injection 80 mg     Laterality: Right  Location/Site:  L5-S1  Needle size: 20 G  Needle type: Tuohy  Needle Placement: Paramedian epidural  Findings:   -Comments: Excellent flow of contrast into the epidural space.  Procedure Details: Using a paramedian approach from the side mentioned above, the region overlying the inferior lamina was localized under fluoroscopic visualization and the soft tissues overlying this structure were infiltrated with 4 ml. of 1% Lidocaine without Epinephrine. The Tuohy needle was inserted into the epidural space using a paramedian approach.   The epidural space was localized using loss of resistance along with lateral and bi-planar fluoroscopic views.  After negative aspirate for air, blood, and CSF, a 2 ml. volume of Isovue-250 was injected into the epidural space and the flow of contrast was observed. Radiographs were obtained for documentation purposes.    The injectate was administered into the level noted above.   Additional Comments:  The patient tolerated the procedure well Dressing: 2 x 2 sterile gauze and Band-Aid    Post-procedure details: Patient was observed during the procedure. Post-procedure instructions were reviewed.  Patient left the clinic in stable condition.

## 2019-04-21 ENCOUNTER — Other Ambulatory Visit: Payer: Self-pay

## 2019-04-21 DIAGNOSIS — Z20822 Contact with and (suspected) exposure to covid-19: Secondary | ICD-10-CM

## 2019-04-22 LAB — NOVEL CORONAVIRUS, NAA: SARS-CoV-2, NAA: NOT DETECTED

## 2019-07-26 ENCOUNTER — Encounter: Payer: Self-pay | Admitting: Family Medicine

## 2019-07-26 ENCOUNTER — Ambulatory Visit (INDEPENDENT_AMBULATORY_CARE_PROVIDER_SITE_OTHER): Payer: Medicaid Other | Admitting: Family Medicine

## 2019-07-26 ENCOUNTER — Other Ambulatory Visit: Payer: Self-pay

## 2019-07-26 DIAGNOSIS — M5441 Lumbago with sciatica, right side: Secondary | ICD-10-CM

## 2019-07-26 DIAGNOSIS — G8929 Other chronic pain: Secondary | ICD-10-CM | POA: Diagnosis not present

## 2019-07-26 MED ORDER — HYDROCODONE-ACETAMINOPHEN 5-325 MG PO TABS: 1.0000 | ORAL_TABLET | Freq: Two times a day (BID) | ORAL | 0 refills | Status: DC | PRN

## 2019-07-26 NOTE — Progress Notes (Signed)
Office Visit Note   Patient: Suzanne Wilcox           Date of Birth: 14-Mar-1976           MRN: [id number] Visit Date: 07/26/2019 Requested by: Maurice Small, MD [address] [place],  [place] [address] PCP: Maurice Small, MD  Subjective: Chief Complaint  Patient presents with  . Lower Back - Pain    Flare up of right-sided sciatica started 2 days ago. Pain going down the leg and up the back some.    HPI: She is here with a flareup of right-sided low back pain.  Symptoms started 2 days ago, no injury.  Pain traveling down the leg to the back of the calf.  She has had intermittent numbness and tingling but nothing constant, she has not noticed any weakness and has not had any bowel or bladder dysfunction.  Pain is best when sitting on a baseball underneath the right posterior hip.  Last time in August we referred her for an epidural injection which helped significantly and she did not have any troubles until now.              ROS: No fevers or chills.  All other systems were reviewed and are negative.  Objective: Vital Signs: There were no vitals taken for this visit.  Physical Exam:  General:  Alert and oriented, in no acute distress. Pulm:  Breathing unlabored. Psy:  Normal mood, congruent affect. Skin: No rash on her skin. Low back: She is tender to palpation over the L5-S1 level and has pain in the right sciatic notch.  Negative crossed straight leg raise on the left but positive straight leg raise on the right.  Lower extremity strength and reflexes are still normal.  Imaging: None today  Assessment & Plan: 1/flareup of right-sided sciatica -We will order an MRI scan and also refer her for epidural steroid injection. -Hydrocodone as needed. -Could contemplate physical therapy if she fails to improve.     Procedures: No procedures performed  No notes on file     PMFS History: Patient Active Problem List   Diagnosis Date Noted  .  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 Herniated cervical intervertebral disc 06/28/2017  . Fatty (change of) liver, not elsewhere classified 12/27/2016  . Postoperative retention of urine 03/28/2016  . Postpartum care following cesarean delivery (9/29) 03/27/2016  . Delivered by cesarean section 03/27/2016  . Gallbladder disease 04/30/2015  . Breech birth 05/22/2014  . Cesarean delivery indicated due to breech presentation 05/22/2014  . Bipolar I disorder, most recent episode (or current) depressed, severe, without mention of psychotic behavior 05/11/2012  . OCD (obsessive compulsive disorder) 05/11/2012  . Substance abuse (HCC) 05/11/2012   Past Medical History:  Diagnosis Date  . Anxiety   . Bipolar disorder (HCC)   . Bipolar disorder (HCC)   . Depression   . GERD (gastroesophageal reflux disease)   . Hx of varicella   . Hypertension   . Missed abortions    X 6  . Obesity 04/29/2012  . Postoperative retention of urine 03/28/2016  . Postpartum care following cesarean delivery (9/29) 03/27/2016  . Termination of pregnancy (fetus) 03/22/2004   X 1  . Vaginal Pap smear, abnormal     Family History  Problem Relation Age of Onset  . Depression Mother   . Skin cancer Mother   . Depression Paternal Grandmother   . Pancreatic cancer Father   . Kidney  Stones Father   . Stroke Father   . Dementia Father   . Skin cancer Sister     Past Surgical History:  Procedure Laterality Date  . CESAREAN SECTION  2011   X 1 [place]  . CESAREAN SECTION N/A 05/22/2014   Procedure: Repeat CESAREAN SECTION;  Surgeon: Lovenia Kim, MD;  [facility];  Service: Obstetrics;  Laterality: N/A;  EDD: 05/31/14  . CESAREAN SECTION WITH BILATERAL TUBAL LIGATION Bilateral 03/27/2016   Procedure: Repeat CESAREAN SECTION WITH BILATERAL TUBAL LIGATION;  Surgeon: Brien Few, MD;  Location: [facility];  Service: Obstetrics;  Laterality: Bilateral;  EDD: 04/01/16  . CHOLECYSTECTOMY N/A  04/29/2017   Procedure: LAPAROSCOPIC CHOLECYSTECTOMY WITH INTRAOPERATIVE CHOLANGIOGRAM;  Surgeon: Alphonsa Overall, MD;  Location: [facility];  Service: General;  Laterality: N/A;  ERAS PATHWAY  . DILATION AND CURETTAGE OF UTERUS   2005, 2010     x2 MAB  . ESOPHAGOGASTRODUODENOSCOPY N/A 01/11/2017   Procedure: ESOPHAGOGASTRODUODENOSCOPY (EGD);  Surgeon: Ronnette Juniper, MD;  Location[facility];  Service: Gastroenterology;  Laterality: N/A;  . IR GENERIC HISTORICAL  03/26/2016   IR FLUORO GUIDE CV LINE RIGHT 03/26/2016 [facility]  . IR GENERIC HISTORICAL  03/26/2016   IR US GUIDE VASC ACCESS RIGHT 03/26/2016 [facility]  . WISDOM TOOTH EXTRACTION     Social History   Occupational History  . Not on file  Tobacco Use  . Smoking status: Former Smoker    Packs/day: 2.00    Years: 18.00    Pack years: 36.00    Types: Cigarettes    Quit date: 04/29/2009    Years since quitting: 10.2  . Smokeless tobacco: Never Used  Substance and Sexual Activity  . Alcohol use: No    Comment: OCCASION  . Drug use: No  . Sexual activity: Not on file

## 2019-07-27 ENCOUNTER — Encounter: Payer: Self-pay | Admitting: Family Medicine

## 2019-07-27 ENCOUNTER — Telehealth: Payer: Self-pay

## 2019-07-27 ENCOUNTER — Telehealth: Payer: Self-pay | Admitting: *Deleted

## 2019-07-27 NOTE — Telephone Encounter (Signed)
I left a message on the patient's voice mail to either call me back or send a message through MyChart, whichever is more convenient for her.

## 2019-07-27 NOTE — Telephone Encounter (Signed)
Patient would like a call back to see if she can be seen tomorrow to have a steroid shot.  Stated that she is in severe pain and the Rx that she was prescribed is not working.  Cb# [phone].  Please advise.  Thank you.

## 2019-07-28 ENCOUNTER — Telehealth: Payer: Self-pay

## 2019-07-28 ENCOUNTER — Telehealth: Payer: Self-pay | Admitting: Family Medicine

## 2019-07-28 MED ORDER — BACLOFEN 10 MG PO TABS: 5.0000 mg | ORAL_TABLET | Freq: Three times a day (TID) | ORAL | 3 refills | Status: DC | PRN

## 2019-07-28 MED ORDER — TIZANIDINE HCL 4 MG PO TABS: 4.0000 mg | ORAL_TABLET | Freq: Four times a day (QID) | ORAL | 3 refills | Status: DC | PRN

## 2019-07-28 MED ORDER — PREDNISONE 10 MG PO TABS: ORAL_TABLET | ORAL | 0 refills | Status: DC

## 2019-07-28 NOTE — Telephone Encounter (Signed)
Called to inform patient that the baclofen has been sent into her pharmacy. She states she already has baclogen 10mg  and is not helping. Requests something stronger or steroid.  Please advise.

## 2019-07-28 NOTE — Addendum Note (Signed)
Addended by: Lillia Carmel on: 07/28/2019 01:00 PM   Modules accepted: Orders

## 2019-07-28 NOTE — Telephone Encounter (Signed)
See other message on this from today, 07/28/19.

## 2019-07-28 NOTE — Telephone Encounter (Signed)
Patient called back - see other message on this from 07/28/19.

## 2019-07-28 NOTE — Telephone Encounter (Signed)
Pt called stating the pain killers she's been taking for her back pain which is spreading down the right side of her body are not helping and is requesting muscle relaxers. Pts pharmacy is [facility] on [address]    [phone]

## 2019-07-28 NOTE — Telephone Encounter (Signed)
Will send both.

## 2019-07-28 NOTE — Telephone Encounter (Signed)
Rx sent 

## 2019-07-28 NOTE — Telephone Encounter (Signed)
See other message on this from 07/28/19.

## 2019-07-28 NOTE — Telephone Encounter (Signed)
I called and advised the patient the muscle relaxer has been changed to tizanidine and a prednisone taper has been sent in to her pharmacy.

## 2019-07-28 NOTE — Telephone Encounter (Signed)
Patient states she is returning your call, wanting prednisone or something for the severe pain she is in

## 2019-07-30 ENCOUNTER — Encounter: Payer: Self-pay | Admitting: Family Medicine

## 2019-07-30 DIAGNOSIS — M5441 Lumbago with sciatica, right side: Secondary | ICD-10-CM

## 2019-07-30 DIAGNOSIS — G8929 Other chronic pain: Secondary | ICD-10-CM

## 2019-07-31 MED ORDER — METHOCARBAMOL 750 MG PO TABS: 750.0000 mg | ORAL_TABLET | Freq: Four times a day (QID) | ORAL | 3 refills | Status: DC | PRN

## 2019-07-31 MED ORDER — OXYCODONE-ACETAMINOPHEN 5-325 MG PO TABS: 1.0000 | ORAL_TABLET | Freq: Four times a day (QID) | ORAL | 0 refills | Status: DC | PRN

## 2019-07-31 MED ORDER — HYDROCODONE-ACETAMINOPHEN 5-325 MG PO TABS: 1.0000 | ORAL_TABLET | Freq: Two times a day (BID) | ORAL | 0 refills | Status: DC | PRN

## 2019-07-31 NOTE — Addendum Note (Signed)
Addended by: Lillia Carmel on: 07/31/2019 01:05 PM   Modules accepted: Orders

## 2019-08-02 ENCOUNTER — Other Ambulatory Visit: Payer: Self-pay

## 2019-08-02 ENCOUNTER — Telehealth: Payer: Self-pay | Admitting: Family Medicine

## 2019-08-02 ENCOUNTER — Ambulatory Visit (INDEPENDENT_AMBULATORY_CARE_PROVIDER_SITE_OTHER): Payer: Medicaid Other

## 2019-08-02 DIAGNOSIS — M545 Low back pain, unspecified: Secondary | ICD-10-CM

## 2019-08-02 NOTE — Telephone Encounter (Signed)
Ok to come in for nurse visit for:  Toradol 60 mg IM  And   Methylprednisolone 40 mg IM

## 2019-08-02 NOTE — Telephone Encounter (Signed)
I called the patient. She will be coming in today around 1 pm for the injections.

## 2019-08-02 NOTE — Progress Notes (Signed)
Visit with me, only, for IM injections for LBP per Dr. Prince Rome:  Ketorolac 60 mg (2 cc of 30 mg/mL) IM right upper outer gluteus  Methylprednisolone 40 mg (1 cc of 40 mg/mL) IM left upper outer gluteus

## 2019-08-02 NOTE — Telephone Encounter (Signed)
Please advise 

## 2019-08-03 NOTE — Addendum Note (Signed)
Addended by: Lillia Carmel on: 08/03/2019 08:15 AM   Modules accepted: Orders

## 2019-08-04 ENCOUNTER — Other Ambulatory Visit: Payer: Self-pay | Admitting: Family Medicine

## 2019-08-04 MED ORDER — IBUPROFEN 800 MG PO TABS: 800.0000 mg | ORAL_TABLET | Freq: Three times a day (TID) | ORAL | 3 refills | Status: DC | PRN

## 2019-08-04 NOTE — Addendum Note (Signed)
Addended by: Lillia Carmel on: 08/04/2019 07:59 AM   Modules accepted: Orders

## 2019-08-10 ENCOUNTER — Encounter: Payer: Self-pay | Admitting: Physical Medicine and Rehabilitation

## 2019-08-10 ENCOUNTER — Ambulatory Visit (INDEPENDENT_AMBULATORY_CARE_PROVIDER_SITE_OTHER): Payer: Medicaid Other | Admitting: Physical Medicine and Rehabilitation

## 2019-08-10 ENCOUNTER — Ambulatory Visit: Payer: Self-pay

## 2019-08-10 ENCOUNTER — Other Ambulatory Visit: Payer: Self-pay

## 2019-08-10 ENCOUNTER — Telehealth: Payer: Self-pay | Admitting: Physical Medicine and Rehabilitation

## 2019-08-10 VITALS — BP 145/95 | HR 77

## 2019-08-10 DIAGNOSIS — M5416 Radiculopathy, lumbar region: Secondary | ICD-10-CM | POA: Diagnosis not present

## 2019-08-10 MED ORDER — METHYLPREDNISOLONE ACETATE 80 MG/ML IJ SUSP
40.0000 mg | Freq: Once | INTRAMUSCULAR | Status: AC
  Administered 2019-08-10: 15:00:00 40 mg

## 2019-08-10 NOTE — Progress Notes (Signed)
.  Numeric Pain Rating Scale and Functional Assessment Average Pain 7   In the last MONTH (on 0-10 scale) has pain interfered with the following?  1. General activity like being  able to carry out your everyday physical activities such as walking, climbing stairs, carrying groceries, or moving a chair?  Rating(7)   +Driver, -BT, -Dye Allergies.   

## 2019-08-11 MED ORDER — OXYCODONE-ACETAMINOPHEN 5-325 MG PO TABS: 1.0000 | ORAL_TABLET | Freq: Four times a day (QID) | ORAL | 0 refills | Status: DC | PRN

## 2019-08-11 NOTE — Telephone Encounter (Signed)
Rx sent 

## 2019-08-11 NOTE — Progress Notes (Signed)
Suzanne SAEPHANH - 44 y.o. female MRN [id number]  Date of birth: Feb 17, 1976  Office Visit Note: Visit Date: 08/10/2019 PCP: Kelton Pillar, MD Referred by: Kelton Pillar, MD  Subjective: Chief Complaint  Patient presents with  . Lower Back - Pain  . Right Leg - Pain   HPI: Suzanne Wilcox is a 44 y.o. female who comes in today For repeat right L5-S1 interlaminar epidural steroid injection at the request of Dr. Legrand Como Hilts.  She has had several months now of ongoing right hip and leg pain.  She has had no focal weakness or other red flag symptoms.  We completed diagnostic epidural injection 2 months ago and she did get relief for a while.  She got more than 50% relief.  MRI of the lumbar spine was evidently denied.  I did agree after speaking with Dr. Junius Roads to complete second epidural injection.  Depending on results with this she really needs imaging of the lumbar spine before any other injections.  Unfortunately her case is complicated by anxiety and bipolar disorder.  Also complicated by morbid obesity.  Prior to the injection she was having pain achy type issues in the exam room and we did teach her some square breathing techniques and she did seem to calm down.  We ended up starting the procedure which she had already had before on the occasion in September.  During that procedure she became very anxious and really increased pain response even though we were nowhere near the spine.  As seen in the report we ended up actually stopping the procedure and letting her regroup and then she wanted to start again and at that point I distracted her by talking about what was going on at home as she has several kids at home that she is taking care of and by the time we finished talking we had finished the epidural injection.  ROS Otherwise per HPI.  Assessment & Plan: Visit Diagnoses:  1. Lumbar radiculopathy     Plan: No additional findings.   Meds & Orders:  Meds ordered this  encounter  Medications  . methylPREDNISolone acetate (DEPO-MEDROL) injection 40 mg    Orders Placed This Encounter  Procedures  . XR C-ARM NO REPORT  . Epidural Steroid injection    Follow-up: Return for visit to requesting physician as needed.   Procedures: No procedures performed  Lumbar Epidural Steroid Injection - Interlaminar Approach with Fluoroscopic Guidance  Patient: Suzanne Wilcox      Date of Birth: 23-May-1976 MRN: [id number] PCP: Kelton Pillar, MD      Visit Date: 08/10/2019   Universal Protocol:     Consent Given By: the patient  Position: PRONE  Additional Comments: Vital signs were monitored before and after the procedure. Patient was prepped and draped in the usual sterile fashion. The correct patient, procedure, and site was verified.   Injection Procedure Details:  Procedure Site One Meds Administered:  Meds ordered this encounter  Medications  . methylPREDNISolone acetate (DEPO-MEDROL) injection 40 mg     Laterality: Right  Location/Site:  L5-S1  Needle size: 20 G, 6 in  Needle type: Tuohy  Needle Placement: Paramedian epidural  Findings:   -Comments: Excellent flow of contrast into the epidural space.  Do to abrupt pain achy and anxious response to initial part of the injection with increased pain intensity even just in the superficial gluteal muscle we ended up stopping the procedure and letting her regroup and then she wanted to start  again and at that point I distracted her by talking about what was going on at home as she has several kids at home that she is taking care of and by the time we finished talking we had finished the epidural injection.   Procedure Details: Using a paramedian approach from the side mentioned above, the region overlying the inferior lamina was localized under fluoroscopic visualization and the soft tissues overlying this structure were infiltrated with 4 ml. of 1% Lidocaine without Epinephrine. The Tuohy  needle was inserted into the epidural space using a paramedian approach.   The epidural space was localized using loss of resistance along with lateral and bi-planar fluoroscopic views.  After negative aspirate for air, blood, and CSF, a 2 ml. volume of Isovue-250 was injected into the epidural space and the flow of contrast was observed. Radiographs were obtained for documentation purposes.    The injectate was administered into the level noted above.   Additional Comments:  As noted above in the findings there is unusual circumstances with the procedure and the patient's panic and anxiety.  It took [place] twice as long to complete the procedure is normal.  There was essentially 100% increase in time it took to do the procedure.   Dressing: 2 x 2 sterile gauze and Band-Aid    Post-procedure details: Patient was observed during the procedure. Post-procedure instructions were reviewed.  Patient left the clinic in stable condition.    Clinical History: No specialty comments available.   She reports that she quit smoking about 10 years ago. Her smoking use included cigarettes. She has a 36.00 pack-year smoking history. She has never used smokeless tobacco. No results for input(s): HGBA1C, LABURIC in the last 8760 hours.  Objective:  VS:  HT:    WT:   BMI:     BP:(!) 145/95  HR:77bpm  TEMP: ( )  RESP:  Physical Exam  Ortho Exam Imaging: XR C-ARM NO REPORT  Result Date: 08/10/2019 Please see Notes tab for imaging impression.   Past Medical/Family/Surgical/Social History: Medications & Allergies reviewed per EMR, new medications updated. Patient Active Problem List   Diagnosis Date Noted  . 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 Herniated cervical intervertebral disc 06/28/2017  . Fatty (change of) liver, not elsewhere classified 12/27/2016  . Postoperative retention of urine 03/28/2016  . Postpartum care following cesarean delivery (9/29) 03/27/2016  . Delivered by cesarean  section 03/27/2016  . Gallbladder disease 04/30/2015  . Breech birth 05/22/2014  . Cesarean delivery indicated due to breech presentation 05/22/2014  . Bipolar I disorder, most recent episode (or current) depressed, severe, without mention of psychotic behavior 05/11/2012  . OCD (obsessive compulsive disorder) 05/11/2012  . Substance abuse (HCC) 05/11/2012   Past Medical History:  Diagnosis Date  . Anxiety   . Bipolar disorder (HCC)   . Bipolar disorder (HCC)   . Depression   . GERD (gastroesophageal reflux disease)   . Hx of varicella   . Hypertension   . Missed abortions    X 6  . Obesity 04/29/2012  . Postoperative retention of urine 03/28/2016  . Postpartum care following cesarean delivery (9/29) 03/27/2016  . Termination of pregnancy (fetus) 03/22/2004   X 1  . Vaginal Pap smear, abnormal    Family History  Problem Relation Age of Onset  . Depression Mother   . Skin cancer Mother   . Depression Paternal Grandmother   . Pancreatic cancer Father   . Kidney Stones Father   .  Stroke Father   . Dementia Father   . Skin cancer Sister    Past Surgical History:  Procedure Laterality Date  . CESAREAN SECTION  2011   X 1 [facility] [place]  . CESAREAN SECTION N/A 05/22/2014   Procedure: Repeat CESAREAN SECTION;  Surgeon: Lenoard Aden, MD;  Location: [facility];  Service: Obstetrics;  Laterality: N/A;  EDD: 05/31/14  . CESAREAN SECTION WITH BILATERAL TUBAL LIGATION Bilateral 03/27/2016   Procedure: Repeat CESAREAN SECTION WITH BILATERAL TUBAL LIGATION;  Surgeon: Olivia Mackie, MD;  Location: [facility];  Service: Obstetrics;  Laterality: Bilateral;  EDD: 04/01/16  . CHOLECYSTECTOMY N/A 04/29/2017   Procedure: LAPAROSCOPIC CHOLECYSTECTOMY WITH INTRAOPERATIVE CHOLANGIOGRAM;  Surgeon: Ovidio Kin, MD;  Location: [facility];  Service: General;  Laterality: N/A;  ERAS PATHWAY  . DILATION AND CURETTAGE OF UTERUS   2005, 2010     x2 MAB  . ESOPHAGOGASTRODUODENOSCOPY N/A 01/11/2017    Procedure: ESOPHAGOGASTRODUODENOSCOPY (EGD);  Surgeon: Kerin Salen, MD;  Location: [facility];  Service: Gastroenterology;  Laterality: N/A;  . IR GENERIC HISTORICAL  03/26/2016   IR FLUORO GUIDE CV LINE RIGHT 03/26/2016 [facility]  . IR GENERIC HISTORICAL  03/26/2016   IR US GUIDE VASC ACCESS RIGHT 03/26/2016 [facility]  . WISDOM TOOTH EXTRACTION     Social History   Occupational History  . Not on file  Tobacco Use  . Smoking status: Former Smoker    Packs/day: 2.00    Years: 18.00    Pack years: 36.00    Types: Cigarettes    Quit date: 04/29/2009    Years since quitting: 10.2  . Smokeless tobacco: Never Used  Substance and Sexual Activity  . Alcohol use: No    Comment: OCCASION  . Drug use: No  . Sexual activity: Not on file

## 2019-08-11 NOTE — Telephone Encounter (Signed)
I called and advised the patient. 

## 2019-08-11 NOTE — Telephone Encounter (Signed)
Please advise 

## 2019-08-11 NOTE — Procedures (Signed)
Lumbar Epidural Steroid Injection - Interlaminar Approach with Fluoroscopic Guidance  Patient: Suzanne Wilcox      Date of Birth: 08/08/1975 MRN: [id number] PCP: Maurice Small, MD      Visit Date: 08/10/2019   Universal Protocol:     Consent Given By: the patient  Position: PRONE  Additional Comments: Vital signs were monitored before and after the procedure. Patient was prepped and draped in the usual sterile fashion. The correct patient, procedure, and site was verified.   Injection Procedure Details:  Procedure Site One Meds Administered:  Meds ordered this encounter  Medications  . methylPREDNISolone acetate (DEPO-MEDROL) injection 40 mg     Laterality: Right  Location/Site:  L5-S1  Needle size: 20 G, 6 in  Needle type: Tuohy  Needle Placement: Paramedian epidural  Findings:   -Comments: Excellent flow of contrast into the epidural space.  Do to abrupt pain achy and anxious response to initial part of the injection with increased pain intensity even just in the superficial gluteal muscle we ended up stopping the procedure and letting her regroup and then she wanted to start again and at that point I distracted her by talking about what was going on at home as she has several kids at home that she is taking care of and by the time we finished talking we had finished the epidural injection.   Procedure Details: Using a paramedian approach from the side mentioned above, the region overlying the inferior lamina was localized under fluoroscopic visualization and the soft tissues overlying this structure were infiltrated with 4 ml. of 1% Lidocaine without Epinephrine. The Tuohy needle was inserted into the epidural space using a paramedian approach.   The epidural space was localized using loss of resistance along with lateral and bi-planar fluoroscopic views.  After negative aspirate for air, blood, and CSF, a 2 ml. volume of Isovue-250 was injected into the  epidural space and the flow of contrast was observed. Radiographs were obtained for documentation purposes.    The injectate was administered into the level noted above.   Additional Comments:  As noted above in the findings there is unusual circumstances with the procedure and the patient's panic and anxiety.  It took [place] twice as long to complete the procedure is normal.  There was essentially 100% increase in time it took to do the procedure.   Dressing: 2 x 2 sterile gauze and Band-Aid    Post-procedure details: Patient was observed during the procedure. Post-procedure instructions were reviewed.  Patient left the clinic in stable condition.

## 2019-08-18 ENCOUNTER — Other Ambulatory Visit: Payer: Self-pay

## 2019-08-18 ENCOUNTER — Ambulatory Visit: Payer: Medicaid Other | Attending: Family Medicine | Admitting: Physical Therapy

## 2019-08-18 ENCOUNTER — Encounter: Payer: Self-pay | Admitting: Physical Therapy

## 2019-08-18 DIAGNOSIS — G8929 Other chronic pain: Secondary | ICD-10-CM | POA: Insufficient documentation

## 2019-08-18 DIAGNOSIS — M5441 Lumbago with sciatica, right side: Secondary | ICD-10-CM | POA: Diagnosis not present

## 2019-08-18 NOTE — Therapy (Signed)
[place], [place], [address] Phone: [phone]   Fax:  [phone]  Physical Therapy Evaluation  Patient Details  Name: Suzanne Wilcox MRN: [id number] Date of Birth: [birth year] Referring Provider (PT): Eunice Blase, MD   Encounter Date: 08/18/2019  PT End of Session - 08/18/19 0858    Visit Number  1    Number of Visits  4    Date for PT Re-Evaluation  09/15/19    Authorization Type  Medicaid-requesting authorization for initial 3 treatment visits    PT Start Time  0812   arrive late   PT Stop Time  0842    PT Time Calculation (min)  30 min    Activity Tolerance  Patient limited by pain    Behavior During Therapy  [facility] for tasks assessed/performed       Past Medical History:  Diagnosis Date  . Anxiety   . Bipolar disorder ([place])   . Bipolar disorder ([place])   . Depression   . GERD (gastroesophageal reflux disease)   . Hx of varicella   . Hypertension   . Missed abortions    X 6  . Obesity 04/29/2012  . Postoperative retention of urine 03/28/2016  . Postpartum care following cesarean delivery (9/29) 03/27/2016  . Termination of pregnancy (fetus) 03/22/2004   X 1  . Vaginal Pap smear, abnormal     Past Surgical History:  Procedure Laterality Date  . CESAREAN SECTION  2011   X 1 [place]  . CESAREAN SECTION N/A 05/22/2014   Procedure: Repeat CESAREAN SECTION;  Surgeon: Lovenia Kim, MD;  Location: [facility];  Service: Obstetrics;  Laterality: N/A;  EDD: 05/31/14  . CESAREAN SECTION WITH BILATERAL TUBAL LIGATION Bilateral 03/27/2016   Procedure: Repeat CESAREAN SECTION WITH BILATERAL TUBAL LIGATION;  Surgeon: Brien Few, MD;  Location: [place];  Service: Obstetrics;  Laterality: Bilateral;  EDD: 04/01/16  . CHOLECYSTECTOMY N/A 04/29/2017   Procedure: LAPAROSCOPIC CHOLECYSTECTOMY WITH INTRAOPERATIVE CHOLANGIOGRAM;  Surgeon: Alphonsa Overall, MD;  Location: [facility];  Service:  General;  Laterality: N/A;  ERAS PATHWAY  . DILATION AND CURETTAGE OF UTERUS   2005, 2010     x2 MAB  . ESOPHAGOGASTRODUODENOSCOPY N/A 01/11/2017   Procedure: ESOPHAGOGASTRODUODENOSCOPY (EGD);  Surgeon: Ronnette Juniper, MD;  Location: [facility];  Service: Gastroenterology;  Laterality: N/A;  . IR GENERIC HISTORICAL  03/26/2016   IR FLUORO GUIDE CV LINE RIGHT 03/26/2016 [facility]  . IR GENERIC HISTORICAL  03/26/2016   IR US GUIDE VASC ACCESS RIGHT 03/26/2016 [facility]  . WISDOM TOOTH EXTRACTION      There were no vitals filed for this visit.   Subjective Assessment - 08/18/19 0842    Subjective  Pt. reports mult-year history of chronic LBP issues since she was a teenager and worse after having children. She had symptom exacerbation last August with right lumbar pain with radiating pain and parasthesias distally to right foot-no mechanism of injury noted. MRI ordered but not completed as of time of eval. Pt. had epidural injection 08/10/19 with mild relief but continues with pain/symptoms.    Pertinent History  anxiety, bipolar, obesity, chronic LBP history    Limitations  Standing;Walking;Sitting;House hold activities;Lifting    Diagnostic tests  MRI ordered, past X-rays 2017    Patient Stated Goals  Be able to move better, "keep up" with her children    Currently in Pain?  Yes    Pain Score  5  Pain Location  Back    Pain Orientation  Lower;Right    Pain Descriptors / Indicators  Aching    Pain Type  Chronic pain    Pain Radiating Towards  right leg distally to right foot    Pain Onset  More than a month ago    Pain Frequency  Constant    Aggravating Factors   prolonged standing, stairs, lying in supine position (has to sleep in recliner)    Pain Relieving Factors  medication    Effect of Pain on Daily Activities  Limits positional tolerance, causes sleep disturbance         [facility] PT Assessment - 08/18/19 0001      Assessment   Medical Diagnosis  Chronic LBP with  right-sided sciatica    Referring Provider (PT)  Lavada Mesi, MD    Onset Date/Surgical Date  02/13/19    Prior Therapy  none      Precautions   Precautions  None      Restrictions   Weight Bearing Restrictions  No      Home Environment   Living Environment  Private residence    Living Arrangements  Spouse/significant other;Children    Type of Home  House    Home Access  Stairs to enter    Entrance Stairs-Number of Steps  4    Entrance Stairs-Rails  Left    Home Layout  --   1 flight of stairs to basement without rail     Prior Function   Level of Independence  Independent with basic ADLs      Cognition   Overall Cognitive Status  Within Functional Limits for tasks assessed      Sensation   Light Touch  Impaired Detail    Light Touch Impaired Details  Impaired RLE      ROM / Strength   AROM / PROM / Strength  AROM;PROM;Strength      AROM   Overall AROM Comments  Bilat. hip AROM grossly WFL for bed mobility    AROM Assessment Site  Lumbar    Lumbar Flexion  80    Lumbar Extension  20   brief extension "feels good" if not held for long   Lumbar - Right Side Bend  12   increased right leg symptoms   Lumbar - Left Side Bend  26    Lumbar - Right Rotation  25%    Lumbar - Left Rotation  40%      PROM   Overall PROM Comments  R hip PROM limited in ER with back and posterolateral hip pain      Strength   Strength Assessment Site  Hip;Knee;Ankle    Right/Left Hip  Right;Left    Right Hip Flexion  4/5    Right Hip Extension  4/5    Right Hip External Rotation   4/5    Right Hip Internal Rotation  4+/5    Right Hip ABduction  4/5    Left Hip Flexion  4+/5    Left Hip Extension  4+/5    Left Hip External Rotation  4/5    Left Hip Internal Rotation  4+/5    Left Hip ABduction  4+/5    Right/Left Knee  Right;Left    Right Knee Flexion  4+/5    Right Knee Extension  5/5    Left Knee Flexion  4+/5    Left Knee Extension  5/5    Right/Left Ankle  Right;Left  Right Ankle Dorsiflexion  4/5    Right Ankle Inversion  5/5    Right Ankle Eversion  4/5    Left Ankle Dorsiflexion  5/5    Left Ankle Inversion  5/5    Left Ankle Eversion  5/5      Special Tests   Other special tests  (+) SLR on right at 30 deg                Objective measurements completed on examination: See above findings.      [facility] Adult PT Treatment/Exercise - 08/18/19 0001      Exercises   Exercises  --   Brief HEP instruction-trial extension bias ROM, see handout            PT Education - 08/18/19 0857    Education Details  HEP, POC, potential symptom etiology    Person(s) Educated  Patient    Methods  Explanation;Demonstration;Verbal cues;Handout    Comprehension  Verbalized understanding          PT Long Term Goals - 08/18/19 0906      PT LONG TERM GOAL #1   Title  Independent with HEP    Baseline  needs HEP    Time  4    Period  Weeks    Status  New    Target Date  09/15/19      PT LONG TERM GOAL #2   Title  Centralize right leg pain proximal to at least knee level for improvement of standing tolerance with decreased leg pain    Baseline  symptoms distally to foot    Time  4    Period  Weeks    Status  New    Target Date  09/15/19      PT LONG TERM GOAL #3   Title  Tolerate standing at least 10-15 minutes with lumbar and RLE pain decreased at least 25% from baseline to improve ability for chores, IADLs at home    Time  4    Period  Weeks    Status  New    Target Date  09/15/19             Plan - 08/18/19 0858    Clinical Impression Statement  Pt. presents with LBP with RLE radicular symptoms with associated leg pain, parasthesias and muscle weakness. Mild extension bias ROM noted for which given this and (+) SLR would suspect possible discogenic etiology-pt. pending MRI to correlate. Pt. would benefit from PT to help relieve pain and address associated functional limitations.    Personal Factors and Comorbidities   Comorbidity 2    Comorbidities  anxiety/bipolar, obesity    Examination-Activity Limitations  Squat;Lift;Stairs;Stand;Sleep    Examination-Participation Restrictions  Community Activity;Shop;Cleaning    Stability/Clinical Decision Making  Evolving/Moderate complexity    Clinical Decision Making  Moderate    Rehab Potential  Fair    PT Frequency  --   1-2x/week   PT Duration  4 weeks    PT Treatment/Interventions  ADLs/Self Care Home Management;Electrical Stimulation;Moist Heat;Traction;Ultrasound;Cryotherapy;Therapeutic activities;Therapeutic exercise;Neuromuscular re-education;Manual techniques;Dry needling;Patient/family education    PT Next Visit Plan  Check response extension bias trunk ROM and progress as tolerated, possible nerve glides RLE, stretch right piriformis, LAD for right hip, STM/foam roll pirifformis, gentle stabilization as tolerated    PT Home Exercise Plan  standing trunk extension (difficulty tolerating in prone due to body habitus), pelvic tilt, LTR    Consulted and Agree with Plan of Care  Patient       Patient will benefit from skilled therapeutic intervention in order to improve the following deficits and impairments:  Pain, Impaired sensation, Decreased strength, Decreased range of motion, Decreased activity tolerance, Difficulty walking  Visit Diagnosis: Chronic right-sided low back pain with right-sided sciatica     Problem List Patient Active Problem List   Diagnosis Date Noted  . 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 Herniated cervical intervertebral disc 06/28/2017  . Fatty (change of) liver, not elsewhere classified 12/27/2016  . Postoperative retention of urine 03/28/2016  . Postpartum care following cesarean delivery (9/29) 03/27/2016  . Delivered by cesarean section 03/27/2016  . Gallbladder disease 04/30/2015  . Breech birth 05/22/2014  . Cesarean delivery indicated due to breech presentation 05/22/2014  . Bipolar I disorder, most recent  episode (or current) depressed, severe, without mention of psychotic behavior 05/11/2012  . OCD (obsessive compulsive disorder) 05/11/2012  . Substance abuse (HCC) 05/11/2012    Lazarus Gowda, PT, DPT 08/18/19 9:10 AM  [facility] [address] [place], [place], [address] Phone: [phone]   Fax:  [phone]  Name: SHAMIKIA LINSKEY MRN: [id number] Date of Birth: 1975-10-25

## 2019-08-18 NOTE — Therapy (Signed)
[place], [place], [address] Phone: [phone]   Fax:  [phone]  Physical Therapy Evaluation  Patient Details  Name: Suzanne Wilcox MRN: [id number] Date of Birth: 03-06-1976 Referring Provider (PT): Eunice Blase, MD   Encounter Date: 08/18/2019  PT End of Session - 08/18/19 0858    Visit Number  1    Number of Visits  4    Date for PT Re-Evaluation  09/15/19    Authorization Type  Medicaid-requesting authorization for initial 3 treatment visits    PT Start Time  0812   arrive late   PT Stop Time  0842    PT Time Calculation (min)  30 min    Activity Tolerance  Patient limited by pain    Behavior During Therapy  [facility] for tasks assessed/performed       Past Medical History:  Diagnosis Date  . Anxiety   . Bipolar disorder ([place])   . Bipolar disorder ([place])   . Depression   . GERD (gastroesophageal reflux disease)   . Hx of varicella   . Hypertension   . Missed abortions    X 6  . Obesity 04/29/2012  . Postoperative retention of urine 03/28/2016  . Postpartum care following cesarean delivery (9/29) 03/27/2016  . Termination of pregnancy (fetus) 03/22/2004   X 1  . Vaginal Pap smear, abnormal     Past Surgical History:  Procedure Laterality Date  . CESAREAN SECTION  2011   X 1 [place]  . CESAREAN SECTION N/A 05/22/2014   Procedure: Repeat CESAREAN SECTION;  Surgeon: Lovenia Kim, MD;  Location: [facility];  Service: Obstetrics;  Laterality: N/A;  EDD: 05/31/14  . CESAREAN SECTION WITH BILATERAL TUBAL LIGATION Bilateral 03/27/2016   Procedure: Repeat CESAREAN SECTION WITH BILATERAL TUBAL LIGATION;  Surgeon: Brien Few, MD;  Location: [facility];  Service: Obstetrics;  Laterality: Bilateral;  EDD: 04/01/16  . CHOLECYSTECTOMY N/A 04/29/2017   Procedure: LAPAROSCOPIC CHOLECYSTECTOMY WITH INTRAOPERATIVE CHOLANGIOGRAM;  Surgeon: Alphonsa Overall, MD;  Location: [facility];  Service:  General;  Laterality: N/A;  ERAS PATHWAY  . DILATION AND CURETTAGE OF UTERUS   2005, 2010     x2 MAB  . ESOPHAGOGASTRODUODENOSCOPY N/A 01/11/2017   Procedure: ESOPHAGOGASTRODUODENOSCOPY (EGD);  Surgeon: Ronnette Juniper, MD;  Location: [facility];  Service: Gastroenterology;  Laterality: N/A;  . IR GENERIC HISTORICAL  03/26/2016   IR FLUORO GUIDE CV LINE RIGHT 03/26/2016 [facility]  . IR GENERIC HISTORICAL  03/26/2016   IR US GUIDE VASC ACCESS RIGHT 03/26/2016 [facility]  . WISDOM TOOTH EXTRACTION      There were no vitals filed for this visit.   Subjective Assessment - 08/18/19 0842    Subjective  Pt. reports mult-year history of chronic LBP issues since she was a teenager and worse after having children. She had symptom exacerbation last August with right lumbar pain with radiating pain and parasthesias distally to right foot-no mechanism of injury noted. MRI ordered but not completed as of time of eval. Pt. had epidural injection 08/10/19 with mild relief but continues with pain/symptoms.    Pertinent History  anxiety, bipolar, obesity, chronic LBP history    Limitations  Standing;Walking;Sitting;House hold activities;Lifting    Diagnostic tests  MRI ordered, past X-rays 2017    Patient Stated Goals  Be able to move better, "keep up" with her children    Currently in Pain?  Yes    Pain Score  5  Pain Location  Back    Pain Orientation  Lower;Right    Pain Descriptors / Indicators  Aching    Pain Type  Chronic pain    Pain Radiating Towards  right leg distally to right foot    Pain Onset  More than a month ago    Pain Frequency  Constant    Aggravating Factors   prolonged standing, stairs, lying in supine position (has to sleep in recliner)    Pain Relieving Factors  medication    Effect of Pain on Daily Activities  Limits positional tolerance, causes sleep disturbance         [facility] PT Assessment - 08/18/19 0001      Assessment   Medical Diagnosis  Chronic LBP with  right-sided sciatica    Referring Provider (PT)  Lavada Mesi, MD    Onset Date/Surgical Date  02/13/19    Prior Therapy  none      Precautions   Precautions  None      Restrictions   Weight Bearing Restrictions  No      Balance Screen   Has the patient fallen in the past 6 months  Yes    How many times?  3-4      Home Environment   Living Environment  Private residence    Living Arrangements  Spouse/significant other;Children    Type of Home  House    Home Access  Stairs to enter    Entrance Stairs-Number of Steps  4    Entrance Stairs-Rails  Left    Home Layout  --   1 flight of stairs to basement without rail     Prior Function   Level of Independence  Independent with basic ADLs      Cognition   Overall Cognitive Status  Within Functional Limits for tasks assessed      Sensation   Light Touch  Impaired Detail    Light Touch Impaired Details  Impaired RLE      ROM / Strength   AROM / PROM / Strength  AROM;PROM;Strength      AROM   Overall AROM Comments  Bilat. hip AROM grossly WFL for bed mobility    AROM Assessment Site  Lumbar    Lumbar Flexion  80    Lumbar Extension  20   brief extension "feels good" if not held for long   Lumbar - Right Side Bend  12   increased right leg symptoms   Lumbar - Left Side Bend  26    Lumbar - Right Rotation  25%    Lumbar - Left Rotation  40%      PROM   Overall PROM Comments  R hip PROM limited in ER with back and posterolateral hip pain      Strength   Strength Assessment Site  Hip;Knee;Ankle    Right/Left Hip  Right;Left    Right Hip Flexion  4/5    Right Hip Extension  4/5    Right Hip External Rotation   4/5    Right Hip Internal Rotation  4+/5    Right Hip ABduction  4/5    Left Hip Flexion  4+/5    Left Hip Extension  4+/5    Left Hip External Rotation  4/5    Left Hip Internal Rotation  4+/5    Left Hip ABduction  4+/5    Right/Left Knee  Right;Left    Right Knee Flexion  4+/5    Right Knee  Extension   5/5    Left Knee Flexion  4+/5    Left Knee Extension  5/5    Right/Left Ankle  Right;Left    Right Ankle Dorsiflexion  4/5    Right Ankle Inversion  5/5    Right Ankle Eversion  4/5    Left Ankle Dorsiflexion  5/5    Left Ankle Inversion  5/5    Left Ankle Eversion  5/5      Special Tests   Other special tests  (+) SLR on right at 30 deg                Objective measurements completed on examination: See above findings.      [facility] Adult PT Treatment/Exercise - 08/18/19 0001      Exercises   Exercises  --   Brief HEP instruction-trial extension bias ROM, see handout            PT Education - 08/18/19 0857    Education Details  HEP, POC, potential symptom etiology    Person(s) Educated  Patient    Methods  Explanation;Demonstration;Verbal cues;Handout    Comprehension  Verbalized understanding          PT Long Term Goals - 08/18/19 0906      PT LONG TERM GOAL #1   Title  Independent with HEP    Baseline  needs HEP    Time  4    Period  Weeks    Status  New    Target Date  09/15/19      PT LONG TERM GOAL #2   Title  Centralize right leg pain proximal to at least knee level for improvement of standing tolerance with decreased leg pain    Baseline  symptoms distally to foot    Time  4    Period  Weeks    Status  New    Target Date  09/15/19      PT LONG TERM GOAL #3   Title  Tolerate standing at least 10-15 minutes with lumbar and RLE pain decreased at least 25% from baseline to improve ability for chores, IADLs at home    Time  4    Period  Weeks    Status  New    Target Date  09/15/19             Plan - 08/18/19 0858    Clinical Impression Statement  Pt. presents with LBP with RLE radicular symptoms with associated leg pain, parasthesias and muscle weakness. Mild extension bias ROM noted for which given this and (+) SLR would suspect possible discogenic etiology-pt. pending MRI to correlate. Pt. would benefit from PT to help  relieve pain and address associated functional limitations.    Personal Factors and Comorbidities  Comorbidity 2    Comorbidities  anxiety/bipolar, obesity    Examination-Activity Limitations  Squat;Lift;Stairs;Stand;Sleep    Examination-Participation Restrictions  Community Activity;Shop;Cleaning    Stability/Clinical Decision Making  Evolving/Moderate complexity    Clinical Decision Making  Moderate    Rehab Potential  Fair    PT Frequency  --   1-2x/week   PT Duration  4 weeks    PT Treatment/Interventions  ADLs/Self Care Home Management;Electrical Stimulation;Moist Heat;Traction;Ultrasound;Cryotherapy;Therapeutic activities;Therapeutic exercise;Neuromuscular re-education;Manual techniques;Dry needling;Patient/family education    PT Next Visit Plan  Check response extension bias trunk ROM and progress as tolerated, possible nerve glides RLE, stretch right piriformis, LAD for right hip, STM/foam roll pirifformis, gentle stabilization as tolerated  PT Home Exercise Plan  standing trunk extension (difficulty tolerating in prone due to body habitus), pelvic tilt, LTR    Consulted and Agree with Plan of Care  Patient       Patient will benefit from skilled therapeutic intervention in order to improve the following deficits and impairments:  Pain, Impaired sensation, Decreased strength, Decreased range of motion, Decreased activity tolerance, Difficulty walking  Visit Diagnosis: Chronic right-sided low back pain with right-sided sciatica - Plan: PT plan of care cert/re-cert     Problem List Patient Active Problem List   Diagnosis Date Noted  . 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 Herniated cervical intervertebral disc 06/28/2017  . Fatty (change of) liver, not elsewhere classified 12/27/2016  . Postoperative retention of urine 03/28/2016  . Postpartum care following cesarean delivery (9/29) 03/27/2016  . Delivered by cesarean section 03/27/2016  . Gallbladder disease 04/30/2015   . Breech birth 05/22/2014  . Cesarean delivery indicated due to breech presentation 05/22/2014  . Bipolar I disorder, most recent episode (or current) depressed, severe, without mention of psychotic behavior 05/11/2012  . OCD (obsessive compulsive disorder) 05/11/2012  . Substance abuse ([facility]) 05/11/2012    Lazarus Gowda, PT, DPT 08/18/19 11:55 AM  [facility] [address] [place], [place], [address] Phone: [phone]   Fax:  [phone]  Name: Suzanne Wilcox MRN: [id number] Date of Birth: [birth year]

## 2019-08-20 ENCOUNTER — Other Ambulatory Visit: Payer: Self-pay | Admitting: Family Medicine

## 2019-08-30 ENCOUNTER — Other Ambulatory Visit: Payer: Self-pay

## 2019-08-30 ENCOUNTER — Other Ambulatory Visit: Payer: Medicaid Other

## 2019-08-30 ENCOUNTER — Ambulatory Visit
Admission: RE | Admit: 2019-08-30 | Discharge: 2019-08-30 | Disposition: A | Discharge status: Home / Self Care | Payer: Medicaid Other | Source: Ambulatory Visit | Attending: Family Medicine | Admitting: Family Medicine

## 2019-08-30 ENCOUNTER — Telehealth: Payer: Self-pay | Admitting: Family Medicine

## 2019-08-30 DIAGNOSIS — M5441 Lumbago with sciatica, right side: Secondary | ICD-10-CM

## 2019-08-30 DIAGNOSIS — G8929 Other chronic pain: Secondary | ICD-10-CM

## 2019-08-30 NOTE — Telephone Encounter (Signed)
MRI shows L2-3 disc protrusion contacting of the right L2 nerve root.  It is also potentially irritating the right L3 nerve root.  L3-4 disc toward the left potentially irritates L3 nerve root.  L4-5 disc protrusion likely affects the right L5 nerve root.  L5-S1 disc causes compression against the right L5 and S1 nerve roots.  I suspect the L5-S1 disc is the most likely source of the severe pain.

## 2019-09-01 ENCOUNTER — Other Ambulatory Visit: Payer: Self-pay

## 2019-09-01 ENCOUNTER — Encounter: Payer: Self-pay | Admitting: Physical Therapy

## 2019-09-01 ENCOUNTER — Ambulatory Visit: Payer: Medicaid Other | Attending: Family Medicine | Admitting: Physical Therapy

## 2019-09-01 DIAGNOSIS — M5441 Lumbago with sciatica, right side: Secondary | ICD-10-CM | POA: Insufficient documentation

## 2019-09-01 DIAGNOSIS — G8929 Other chronic pain: Secondary | ICD-10-CM | POA: Diagnosis present

## 2019-09-01 NOTE — Therapy (Signed)
[facility] [address] [place], [place], [address] Phone: [phone]   Fax:  [phone]  Physical Therapy Treatment  Patient Details  Name: Suzanne Wilcox MRN: [id number] Date of Birth: January 06, 1976 Referring Provider (PT): Lavada Mesi, MD   Encounter Date: 09/01/2019  PT End of Session - 09/01/19 0908    Visit Number  2    Number of Visits  4    Date for PT Re-Evaluation  09/15/19    Authorization Type  Medicaid    Authorization Time Period  09/01/19-09/21/19    Authorization - Visit Number  1    Authorization - Number of Visits  3    PT Start Time  0845    PT Stop Time  0937    PT Time Calculation (min)  52 min    Activity Tolerance  Patient tolerated treatment well    Behavior During Therapy  [facility] for tasks assessed/performed       Past Medical History:  Diagnosis Date  . Anxiety   . Bipolar disorder ([facility])   . Bipolar disorder ([facility])   . Depression   . GERD (gastroesophageal reflux disease)   . Hx of varicella   . Hypertension   . Missed abortions    X 6  . Obesity 04/29/2012  . Postoperative retention of urine 03/28/2016  . Postpartum care following cesarean delivery (9/29) 03/27/2016  . Termination of pregnancy (fetus) 03/22/2004   X 1  . Vaginal Pap smear, abnormal     Past Surgical History:  Procedure Laterality Date  . CESAREAN SECTION  2011   X 1 [facility] [place]  . CESAREAN SECTION N/A 05/22/2014   Procedure: Repeat CESAREAN SECTION;  Surgeon: Lenoard Aden, MD;  Location: [facility];  Service: Obstetrics;  Laterality: N/A;  EDD: 05/31/14  . CESAREAN SECTION WITH BILATERAL TUBAL LIGATION Bilateral 03/27/2016   Procedure: Repeat CESAREAN SECTION WITH BILATERAL TUBAL LIGATION;  Surgeon: Olivia Mackie, MD;  Location: [facility];  Service: Obstetrics;  Laterality: Bilateral;  EDD: 04/01/16  . CHOLECYSTECTOMY N/A 04/29/2017   Procedure: LAPAROSCOPIC CHOLECYSTECTOMY WITH INTRAOPERATIVE CHOLANGIOGRAM;   Surgeon: Ovidio Kin, MD;  Location: [facility];  Service: General;  Laterality: N/A;  ERAS PATHWAY  . DILATION AND CURETTAGE OF UTERUS   2005, 2010     x2 [facility]  . ESOPHAGOGASTRODUODENOSCOPY N/A 01/11/2017   Procedure: ESOPHAGOGASTRODUODENOSCOPY (EGD);  Surgeon: Kerin Salen, MD;  Location: [facility];  Service: Gastroenterology;  Laterality: N/A;  . IR GENERIC HISTORICAL  03/26/2016   IR FLUORO GUIDE CV LINE RIGHT 03/26/2016 [facility]  . IR GENERIC HISTORICAL  03/26/2016   IR US GUIDE VASC ACCESS RIGHT 03/26/2016 [facility]  . WISDOM TOOTH EXTRACTION      There were no vitals filed for this visit.  Subjective Assessment - 09/01/19 0855    Subjective  Pt. had MRI which showed disc herniations (see chart copy of report) in particular with potential herniation at L5-S1 contacting nerve root on right side. She is reporting more relief from injection with pain 3/10 this AM. Extension ROM helpful.    Currently in Pain?  Yes    Pain Score  3     Pain Location  Back    Pain Orientation  Right;Lower    Pain Descriptors / Indicators  Aching    Pain Type  Chronic pain    Pain Radiating Towards  right hip    Pain Onset  More than a month ago  Pain Frequency  Constant    Aggravating Factors   prolonged standing, stairs, lying in supine position    Pain Relieving Factors  medication    Effect of Pain on Daily Activities  limits positional tolerance, causes sleep disturbance                       [facility] Adult PT Treatment/Exercise - 09/01/19 0001      Exercises   Exercises  Lumbar      Lumbar Exercises: Stretches   Piriformis Stretch  Right;Left;3 reps;20 seconds      Lumbar Exercises: Standing   Other Standing Lumbar Exercises  extension in standing 2x10 with therapist holding belt      Lumbar Exercises: Supine   Pelvic Tilt  15 reps    Clam  15 reps    Clam Limitations  greeb Theraband    Bridge  15 reps    Bridge Limitations  partial bridge    Other Supine Lumbar  Exercises  supine right dural nerve floss from 90/90 position 2x10    Other Supine Lumbar Exercises  hip adduction isometric 3-5 sec x 15 reps      Lumbar Exercises: Sidelying   Other Sidelying Lumbar Exercises  left sidelying right trunk rotation x 15 reps      Modalities   Modalities  Moist Heat      Moist Heat Therapy   Number Minutes Moist Heat  12 Minutes    Moist Heat Location  Lumbar Spine   seated     Manual Therapy   Manual Therapy  Soft tissue mobilization    Manual therapy comments  LAD bilat. hips grade I-III oscillations    Soft tissue mobilization  IASTM/roller right piriformis, glut in left sidelying             PT Education - 09/01/19 0928    Education Details  exercises    Person(s) Educated  Patient    Methods  Explanation;Demonstration;Verbal cues    Comprehension  Returned demonstration;Verbalized understanding          PT Long Term Goals - 08/18/19 0906      PT LONG TERM GOAL #1   Title  Independent with HEP    Baseline  needs HEP    Time  4    Period  Weeks    Status  New    Target Date  09/15/19      PT LONG TERM GOAL #2   Title  Centralize right leg pain proximal to at least knee level for improvement of standing tolerance with decreased leg pain    Baseline  symptoms distally to foot    Time  4    Period  Weeks    Status  New    Target Date  09/15/19      PT LONG TERM GOAL #3   Title  Tolerate standing at least 10-15 minutes with lumbar and RLE pain decreased at least 25% from baseline to improve ability for chores, IADLs at home    Time  4    Period  Weeks    Status  New    Target Date  09/15/19            Plan - 09/01/19 4132    Clinical Impression Statement  Tx. focus extension bias ROM, manual and neutral spine sore stabilization all woith good tolerance. Improved pain from baseline with relief after injection. Progress re: therapy goals with be ongoing.  Personal Factors and Comorbidities  Comorbidity 2     Comorbidities  anxiety/bipolar, obesity    Examination-Activity Limitations  Squat;Lift;Stairs;Stand;Sleep    Examination-Participation Restrictions  Community Activity;Shop;Cleaning    Stability/Clinical Decision Making  Evolving/Moderate complexity    Clinical Decision Making  Moderate    Rehab Potential  Fair    PT Frequency  --   1-2x/week   PT Duration  4 weeks    PT Treatment/Interventions  ADLs/Self Care Home Management;Electrical Stimulation;Moist Heat;Traction;Ultrasound;Cryotherapy;Therapeutic activities;Therapeutic exercise;Neuromuscular re-education;Manual techniques;Dry needling;Patient/family education    PT Next Visit Plan  extension bias trunk ROM and progress as tolerated, possible nerve glides RLE, stretch right piriformis, LAD for right hip, STM/foam roll pirifformis, gentle stabilization as tolerated    PT Home Exercise Plan  standing trunk extension (difficulty tolerating in prone due to body habitus), pelvic tilt, LTR    Consulted and Agree with Plan of Care  Patient       Patient will benefit from skilled therapeutic intervention in order to improve the following deficits and impairments:  Pain, Impaired sensation, Decreased strength, Decreased range of motion, Decreased activity tolerance, Difficulty walking  Visit Diagnosis: Chronic right-sided low back pain with right-sided sciatica     Problem List Patient Active Problem List   Diagnosis Date Noted  . 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 Herniated cervical intervertebral disc 06/28/2017  . Fatty (change of) liver, not elsewhere classified 12/27/2016  . Postoperative retention of urine 03/28/2016  . Postpartum care following cesarean delivery (9/29) 03/27/2016  . Delivered by cesarean section 03/27/2016  . Gallbladder disease 04/30/2015  . Breech birth 05/22/2014  . Cesarean delivery indicated due to breech presentation 05/22/2014  . Bipolar I disorder, most recent episode (or current) depressed,  severe, without mention of psychotic behavior 05/11/2012  . OCD (obsessive compulsive disorder) 05/11/2012  . Substance abuse ([place]) 05/11/2012    Beaulah Dinning, PT, DPT 09/01/19 9:31 AM  [facility] [address] [place], [place], [address] Phone: [phone]   Fax:  [phone]  Name: MICHAELENE DUTAN MRN: [id number] Date of Birth: 1976/05/28

## 2019-09-04 ENCOUNTER — Other Ambulatory Visit: Payer: Self-pay

## 2019-09-04 ENCOUNTER — Ambulatory Visit: Payer: Medicaid Other | Admitting: Psychiatry

## 2019-09-06 ENCOUNTER — Other Ambulatory Visit: Payer: Self-pay

## 2019-09-06 ENCOUNTER — Encounter: Payer: Self-pay | Admitting: Physical Therapy

## 2019-09-06 ENCOUNTER — Ambulatory Visit: Payer: Medicaid Other | Admitting: Physical Therapy

## 2019-09-06 DIAGNOSIS — G8929 Other chronic pain: Secondary | ICD-10-CM

## 2019-09-06 DIAGNOSIS — M5441 Lumbago with sciatica, right side: Secondary | ICD-10-CM | POA: Diagnosis not present

## 2019-09-06 NOTE — Therapy (Signed)
[place], [place], [address] Phone: [phone]   Fax:  [phone]  Physical Therapy Treatment  Patient Details  Name: Suzanne Wilcox MRN: [id number] Date of Birth: [birth year] Referring Provider (PT): Eunice Blase, MD   Encounter Date: 09/06/2019  PT End of Session - 09/06/19 0929    Visit Number  3    Number of Visits  4    Date for PT Re-Evaluation  09/15/19    Authorization Type  Medicaid    Authorization Time Period  09/01/19-09/21/19    Authorization - Visit Number  2    Authorization - Number of Visits  3    PT Start Time  0930    PT Stop Time  1022    PT Time Calculation (min)  52 min    Activity Tolerance  Patient tolerated treatment well    Behavior During Therapy  [facility] for tasks assessed/performed       Past Medical History:  Diagnosis Date  . Anxiety   . Bipolar disorder ([place])   . Bipolar disorder ([place])   . Depression   . GERD (gastroesophageal reflux disease)   . Hx of varicella   . Hypertension   . Missed abortions    X 6  . Obesity 04/29/2012  . Postoperative retention of urine 03/28/2016  . Postpartum care following cesarean delivery (9/29) 03/27/2016  . Termination of pregnancy (fetus) 03/22/2004   X 1  . Vaginal Pap smear, abnormal     Past Surgical History:  Procedure Laterality Date  . CESAREAN SECTION  2011   X 1 [place]  . CESAREAN SECTION N/A 05/22/2014   Procedure: Repeat CESAREAN SECTION;  Surgeon: Lovenia Kim, MD;  Location: [facility];  Service: Obstetrics;  Laterality: N/A;  EDD: 05/31/14  . CESAREAN SECTION WITH BILATERAL TUBAL LIGATION Bilateral 03/27/2016   Procedure: Repeat CESAREAN SECTION WITH BILATERAL TUBAL LIGATION;  Surgeon: Brien Few, MD;  Location: [facility];  Service: Obstetrics;  Laterality: Bilateral;  EDD: 04/01/16  . CHOLECYSTECTOMY N/A 04/29/2017   Procedure: LAPAROSCOPIC CHOLECYSTECTOMY WITH INTRAOPERATIVE CHOLANGIOGRAM;   Surgeon: Alphonsa Overall, MD;  Location: [facility];  Service: General;  Laterality: N/A;  ERAS PATHWAY  . DILATION AND CURETTAGE OF UTERUS   2005, 2010     x2 MAB  . ESOPHAGOGASTRODUODENOSCOPY N/A 01/11/2017   Procedure: ESOPHAGOGASTRODUODENOSCOPY (EGD);  Surgeon: Ronnette Juniper, MD;  Location: [facility];  Service: Gastroenterology;  Laterality: N/A;  . IR GENERIC HISTORICAL  03/26/2016   IR FLUORO GUIDE CV LINE RIGHT 03/26/2016 [facility]  . IR GENERIC HISTORICAL  03/26/2016   IR US GUIDE VASC ACCESS RIGHT 03/26/2016 [facility]  . WISDOM TOOTH EXTRACTION      There were no vitals filed for this visit.  Subjective Assessment - 09/06/19 0938    Subjective  Pt. reports pain has returned mainly in back but still with intermittent leg symptoms. No specific exacerbation from exercises.    Currently in Pain?  Yes    Pain Score  4     Pain Location  Back    Pain Orientation  Right;Lower    Pain Descriptors / Indicators  Aching    Pain Type  Chronic pain    Pain Radiating Towards  right hip    Pain Onset  More than a month ago    Pain Frequency  Constant    Aggravating Factors   prolonged standing, stairs, lying in supine position  Pain Relieving Factors  medication    Effect of Pain on Daily Activities  limits positional tolerance, causes sleep disturbance                       [facility] Adult PT Treatment/Exercise - 09/06/19 0001      Lumbar Exercises: Stretches   Passive Hamstring Stretch  Right;3 reps;30 seconds    Lower Trunk Rotation Limitations  Left LTR with legs on 55 cm P-ball x 7 reps stopped due to increased back/hip pain    Piriformis Stretch  Right;Left;3 reps;20 seconds      Lumbar Exercises: Supine   Pelvic Tilt  15 reps    Pelvic Tilt Limitations  verbal cues for posterior rather than anterior tilt    Clam  15 reps    Clam Limitations  green Theraband    Bent Knee Raise  15 reps    Bent Knee Raise Limitations  with posterior pelvic tilt    Bridge  15  reps    Bridge Limitations  partial bridge    Other Supine Lumbar Exercises  hip adduction isometric 3-5 sec x 15 reps      Moist Heat Therapy   Number Minutes Moist Heat  12 Minutes    Moist Heat Location  Lumbar Spine   in sitting     Manual Therapy   Manual therapy comments  LAD bilat. hips grade I-III oscillations    Soft tissue mobilization  IASTM/roller bilat. piriformis, glut in sidelying                  PT Long Term Goals - 08/18/19 0906      PT LONG TERM GOAL #1   Title  Independent with HEP    Baseline  needs HEP    Time  4    Period  Weeks    Status  New    Target Date  09/15/19      PT LONG TERM GOAL #2   Title  Centralize right leg pain proximal to at least knee level for improvement of standing tolerance with decreased leg pain    Baseline  symptoms distally to foot    Time  4    Period  Weeks    Status  New    Target Date  09/15/19      PT LONG TERM GOAL #3   Title  Tolerate standing at least 10-15 minutes with lumbar and RLE pain decreased at least 25% from baseline to improve ability for chores, IADLs at home    Time  4    Period  Weeks    Status  New    Target Date  09/15/19            Plan - 09/06/19 2841    Clinical Impression Statement  Fair status for therapy with increased pain from last visit, possible associated with benefit from injection wearing off otherwise no specific exacerbating cause. Limited tolerance attempted LTR stretch otherwise able to tolerance exercises and manual as noted per flowsheet.    Personal Factors and Comorbidities  Comorbidity 2    Comorbidities  anxiety/bipolar, obesity    Examination-Activity Limitations  Squat;Lift;Stairs;Stand;Sleep    Examination-Participation Restrictions  Community Activity;Shop;Cleaning    Stability/Clinical Decision Making  Evolving/Moderate complexity    Clinical Decision Making  Moderate    Rehab Potential  Fair    PT Frequency  --   1-2x/week   PT Duration  4 weeks  PT Treatment/Interventions  ADLs/Self Care Home Management;Electrical Stimulation;Moist Heat;Traction;Ultrasound;Cryotherapy;Therapeutic activities;Therapeutic exercise;Neuromuscular re-education;Manual techniques;Dry needling;Patient/family education    PT Next Visit Plan  ERO next session, update HEP as needed, continue extension bias trunk ROM and progress as tolerated, prn nerve glides RLE, stretch right piriformis, LAD for right hip, STM/foam roll pirifformis, gentle stabilization as tolerated    PT Home Exercise Plan  standing trunk extension (difficulty tolerating in prone due to body habitus), pelvic tilt, LTR    Consulted and Agree with Plan of Care  Patient       Patient will benefit from skilled therapeutic intervention in order to improve the following deficits and impairments:  Pain, Impaired sensation, Decreased strength, Decreased range of motion, Decreased activity tolerance, Difficulty walking  Visit Diagnosis: Chronic right-sided low back pain with right-sided sciatica     Problem List Patient Active Problem List   Diagnosis Date Noted  . 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 Herniated cervical intervertebral disc 06/28/2017  . Fatty (change of) liver, not elsewhere classified 12/27/2016  . Postoperative retention of urine 03/28/2016  . Postpartum care following cesarean delivery (9/29) 03/27/2016  . Delivered by cesarean section 03/27/2016  . Gallbladder disease 04/30/2015  . Breech birth 05/22/2014  . Cesarean delivery indicated due to breech presentation 05/22/2014  . Bipolar I disorder, most recent episode (or current) depressed, severe, without mention of psychotic behavior 05/11/2012  . OCD (obsessive compulsive disorder) 05/11/2012  . Substance abuse ([facility]) 05/11/2012    Lazarus Gowda, PT, DPT 09/06/19 10:11 AM  [facility] [address] [place], [place], [address] Phone: [phone]   Fax:   [phone]  Name: Suzanne Wilcox MRN: [id number] Date of Birth: [birth year]

## 2019-09-08 ENCOUNTER — Encounter: Payer: Self-pay | Admitting: Physical Therapy

## 2019-09-08 ENCOUNTER — Other Ambulatory Visit: Payer: Self-pay

## 2019-09-08 ENCOUNTER — Ambulatory Visit: Payer: Medicaid Other | Admitting: Physical Therapy

## 2019-09-08 DIAGNOSIS — M5441 Lumbago with sciatica, right side: Secondary | ICD-10-CM | POA: Diagnosis not present

## 2019-09-08 DIAGNOSIS — G8929 Other chronic pain: Secondary | ICD-10-CM

## 2019-09-08 NOTE — Therapy (Signed)
[place], [place], [address] Phone: [phone]   Fax:  [phone]  Physical Therapy Treatment/Recertification  Patient Details  Name: Suzanne Wilcox MRN: [id number] Date of Birth: 1975-10-30 Referring Provider (PT): Eunice Blase, MD   Encounter Date: 09/08/2019  PT End of Session - 09/08/19 0902    Visit Number  4    Number of Visits  12    Date for PT Re-Evaluation  10/13/19    Authorization Type  Medicaid    Authorization Time Period  09/01/19-09/21/19    Authorization - Visit Number  3    Authorization - Number of Visits  3    PT Start Time  0845    PT Stop Time  0937    PT Time Calculation (min)  52 min    Activity Tolerance  Patient tolerated treatment well    Behavior During Therapy  [facility] for tasks assessed/performed       Past Medical History:  Diagnosis Date  . Anxiety   . Bipolar disorder ([place])   . Bipolar disorder ([place])   . Depression   . GERD (gastroesophageal reflux disease)   . Hx of varicella   . Hypertension   . Missed abortions    X 6  . Obesity 04/29/2012  . Postoperative retention of urine 03/28/2016  . Postpartum care following cesarean delivery (9/29) 03/27/2016  . Termination of pregnancy (fetus) 03/22/2004   X 1  . Vaginal Pap smear, abnormal     Past Surgical History:  Procedure Laterality Date  . CESAREAN SECTION  2011   X 1 [place]  . CESAREAN SECTION N/A 05/22/2014   Procedure: Repeat CESAREAN SECTION;  Surgeon: Lovenia Kim, MD;  Location: [facility];  Service: Obstetrics;  Laterality: N/A;  EDD: 05/31/14  . CESAREAN SECTION WITH BILATERAL TUBAL LIGATION Bilateral 03/27/2016   Procedure: Repeat CESAREAN SECTION WITH BILATERAL TUBAL LIGATION;  Surgeon: Brien Few, MD;  Location: [facility];  Service: Obstetrics;  Laterality: Bilateral;  EDD: 04/01/16  . CHOLECYSTECTOMY N/A 04/29/2017   Procedure: LAPAROSCOPIC CHOLECYSTECTOMY WITH INTRAOPERATIVE  CHOLANGIOGRAM;  Surgeon: Alphonsa Overall, MD;  Location: [facility];  Service: General;  Laterality: N/A;  ERAS PATHWAY  . DILATION AND CURETTAGE OF UTERUS   2005, 2010     x2 MAB  . ESOPHAGOGASTRODUODENOSCOPY N/A 01/11/2017   Procedure: ESOPHAGOGASTRODUODENOSCOPY (EGD);  Surgeon: Ronnette Juniper, MD;  Location: [facility];  Service: Gastroenterology;  Laterality: N/A;  . IR GENERIC HISTORICAL  03/26/2016   IR FLUORO GUIDE CV LINE RIGHT 03/26/2016 [facility]  . IR GENERIC HISTORICAL  03/26/2016   IR US GUIDE VASC ACCESS RIGHT 03/26/2016 [facility]  . WISDOM TOOTH EXTRACTION      There were no vitals filed for this visit.  Subjective Assessment - 09/08/19 0846    Subjective  Pt. presents for 4th therapy visit (3rd tx. session) since starting therapy but also reports noting that it feels like benefits from last lumbar injection are wearing off so this has caused some increased pain/symptoms with continued LBP and radiating symptoms in right LE distal to ankle. Functionally she is noting some improved ability to navigate stairs at home but continues with limited positional tolerance.    Pertinent History  anxiety, bipolar, obesity, chronic LBP history    Limitations  Standing;Walking;Sitting;House hold activities;Lifting    Diagnostic tests  MRI ordered, past X-rays 2017    Patient Stated Goals  Be able to move better, "keep up"  with her children    Currently in Pain?  Yes    Pain Score  --   6-7/10   Pain Location  Back    Pain Orientation  Right;Lower    Pain Descriptors / Indicators  Aching    Pain Type  Chronic pain    Pain Radiating Towards  right LE distal to ankle    Pain Onset  More than a month ago    Pain Frequency  Constant    Aggravating Factors   standing, stairs, supine positioning    Pain Relieving Factors  medication    Effect of Pain on Daily Activities  limits positional tolerance, sleep disturbance, difficulty with stair navigation         [facility] PT Assessment - 09/08/19  0001      AROM   Lumbar Flexion  90    Lumbar Extension  20    Lumbar - Right Side Bend  23    Lumbar - Left Side Bend  28    Lumbar - Right Rotation  50%    Lumbar - Left Rotation  80%      Strength   Right Hip Flexion  4/5    Right Hip Extension  4/5    Right Hip External Rotation   4/5    Right Hip Internal Rotation  4+/5    Right Hip ABduction  4/5    Right Hip ADduction  4+/5    Left Hip Flexion  4+/5    Left Hip Extension  4+/5    Left Hip External Rotation  4/5    Left Hip Internal Rotation  4+/5    Left Hip ABduction  4+/5    Left Hip ADduction  4+/5    Right Knee Flexion  4+/5    Right Knee Extension  5/5    Left Knee Flexion  5/5    Left Knee Extension  5/5    Right Ankle Dorsiflexion  4+/5    Right Ankle Inversion  4+/5    Right Ankle Eversion  4+/5    Left Ankle Dorsiflexion  5/5    Left Ankle Inversion  5/5    Left Ankle Eversion  5/5                   [facility] Adult PT Treatment/Exercise - 09/08/19 0001      Lumbar Exercises: Stretches   Lower Trunk Rotation Limitations  left LTR with legs on 55 cm P-ball assisted by therapist x 15 reps    Other Lumbar Stretch Exercise  extension in standing with belt assist/hold by therapist 2x10      Lumbar Exercises: Standing   Heel Raises  15 reps    Heel Raises Limitations  on Airex    Functional Squats Limitations  partial/mini squat at counter x 15 reps    Row  AROM;Strengthening;5 reps;15 reps    Theraband Level (Row)  Level 4 (Blue)    Row Limitations  on Airex    Shoulder Extension  AROM;Strengthening;Both;15 reps    Theraband Level (Shoulder Extension)  Level 3 (Green)    Other Standing Lumbar Exercises  Hip abd SLR in standing x 15 ea. bilat.    Other Standing Lumbar Exercises  step ups 4 in. x 15 ea. bilat.      Lumbar Exercises: Supine   Pelvic Tilt  15 reps    Clam  20 reps    Clam Limitations  green Theraband    Bent Knee Raise  15 reps    Bent Knee Raise Limitations  with posterior  pelvic tilt    Bridge  15 reps    Bridge Limitations  partial bridge      Moist Heat Therapy   Number Minutes Moist Heat  12 Minutes    Moist Heat Location  Lumbar Spine   in sitting     Manual Therapy   Manual therapy comments  LAD right hip grade I-III oscillations                  PT Long Term Goals - 09/08/19 0857      PT LONG TERM GOAL #1   Title  Independent with HEP    Baseline  met for initial HEP, will continue to update prn    Time  4    Period  Weeks    Status  Achieved    Target Date  10/13/19      PT LONG TERM GOAL #2   Title  Centralize right leg pain proximal to at least knee level for improvement of standing tolerance with decreased leg pain    Baseline  symptoms distally to foot/ankle    Time  4    Period  Weeks    Status  On-going    Target Date  10/13/19      PT LONG TERM GOAL #3   Title  Tolerate standing at least 10-15 minutes with lumbar and RLE pain decreased at least 25% from baseline to improve ability for chores, IADLs at home    Baseline  initially improved to this level but noting recent exacerbation which pt. attributes to relief from injection wearing off    Time  4    Period  Weeks    Status  On-going    Target Date  10/13/19      PT LONG TERM GOAL #4   Title  Increase right LE strength at least grossly 1/2 MMT grade to improve ability to navigate stairs at home    Baseline  hip abd, flex, ext, ER 4/5, IR 4+/5, kneeflex 4+/5, ankle 4+/5    Time  4    Period  Weeks    Status  New    Target Date  10/13/19            Plan - 09/08/19 0932    Clinical Impression Statement  Fair status with therapy progress to date with mild functional gains for improved ability stair navigation but still with limited positional and activity tolerance as noted in subjective. Status also impacted by benefits from injection potentially wearing off with contribution of increased pain to functional limitations as well. Pt. wishing to avoid  surgery so plan continue therapy for potential further benefit to decrease pain and improve activity and positional tolerance.    Personal Factors and Comorbidities  Comorbidity 2    Comorbidities  anxiety/bipolar, obesity    Examination-Activity Limitations  Squat;Lift;Stairs;Stand;Sleep    Examination-Participation Restrictions  Community Activity;Shop;Cleaning    Stability/Clinical Decision Making  Evolving/Moderate complexity    Clinical Decision Making  Moderate    Rehab Potential  Fair    PT Frequency  2x / week    PT Duration  4 weeks    PT Treatment/Interventions  ADLs/Self Care Home Management;Electrical Stimulation;Moist Heat;Traction;Ultrasound;Cryotherapy;Therapeutic activities;Therapeutic exercise;Neuromuscular re-education;Manual techniques;Dry needling;Patient/family education    PT Next Visit Plan  continue extension bias trunk ROM and progress as tolerated, prn nerve glides RLE, stretch right piriformis, LAD for right hip, STM/foam roll  pirifformis, gentle stabilization as tolerated    PT Home Exercise Plan  standing trunk extension (difficulty tolerating in prone due to body habitus), pelvic tilt, LTR    Consulted and Agree with Plan of Care  Patient       Patient will benefit from skilled therapeutic intervention in order to improve the following deficits and impairments:  Pain, Impaired sensation, Decreased strength, Decreased range of motion, Decreased activity tolerance, Difficulty walking  Visit Diagnosis: Chronic right-sided low back pain with right-sided sciatica     Problem List Patient Active Problem List   Diagnosis Date Noted  . 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 Herniated cervical intervertebral disc 06/28/2017  . Fatty (change of) liver, not elsewhere classified 12/27/2016  . Postoperative retention of urine 03/28/2016  . Postpartum care following cesarean delivery (9/29) 03/27/2016  . Delivered by cesarean section 03/27/2016  . Gallbladder  disease 04/30/2015  . Breech birth 05/22/2014  . Cesarean delivery indicated due to breech presentation 05/22/2014  . Bipolar I disorder, most recent episode (or current) depressed, severe, without mention of psychotic behavior 05/11/2012  . OCD (obsessive compulsive disorder) 05/11/2012  . Substance abuse ([place]) 05/11/2012    Beaulah Dinning, PT, DPT 09/08/19 9:39 AM  [facility] [address] [place], [place], [address] Phone: [phone]   Fax:  [phone]  Name: Suzanne Wilcox MRN: [id number] Date of Birth: Nov 19, 1975

## 2019-09-11 IMAGING — MR MR CERVICAL SPINE W/O CM
4 of 5 series · 27 of 48 positions shown · non-contrast
Comparison: Prior radiograph from 07/21/2018 as well as previous
MRI from 01/13/2016

CLINICAL DATA: Initial evaluation for neck and diffuse body pain
for 4 months, with involvement of the bilateral shoulders and both
upper extremities including weakness and numbness.

EXAM:
MRI CERVICAL SPINE WITHOUT CONTRAST
TECHNIQUE: Multiplanar, multisequence MR imaging of the cervical spine was
performed. No intravenous contrast was administered.

[Series 6: T1 · sagittal · 3.4mm · 0.66mm/px · 6 of 13 slices shown]
[im 1/13]
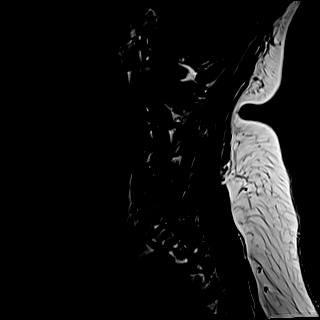
[im 3/13]
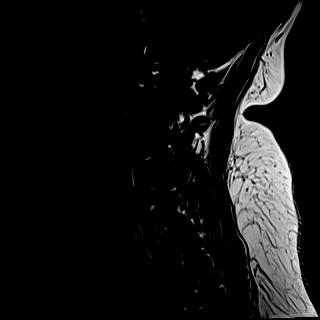
[im 5/13]
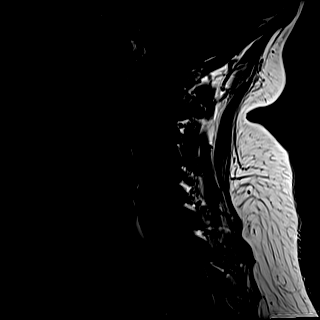
[im 8/13]
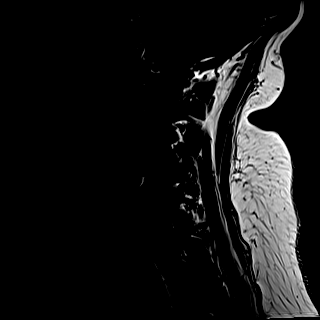
[im 10/13]
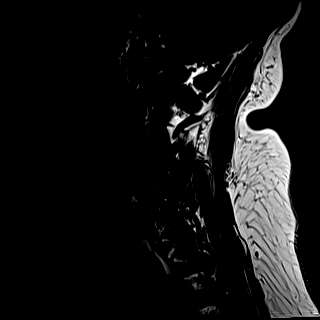
[im 13/13]
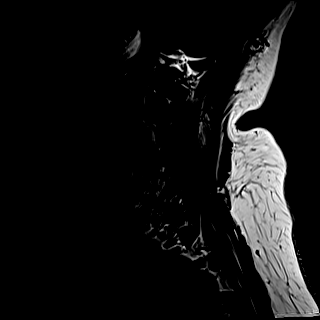

[Series 7: T2 · sagittal · 3.4mm · 0.55mm/px · 7 of 13 slices shown (1 of 2)]
[im 1/13]
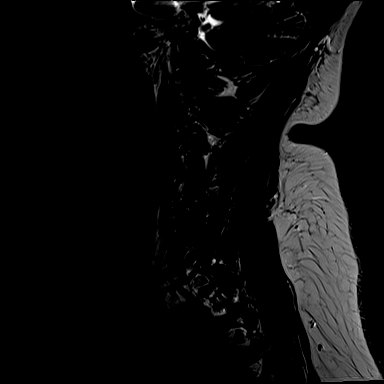
[im 3/13]
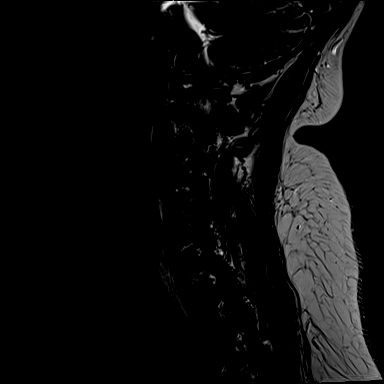
[im 5/13]
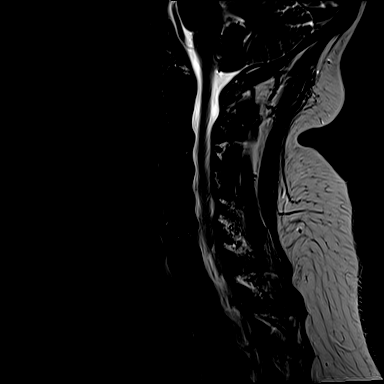
[im 7/13]
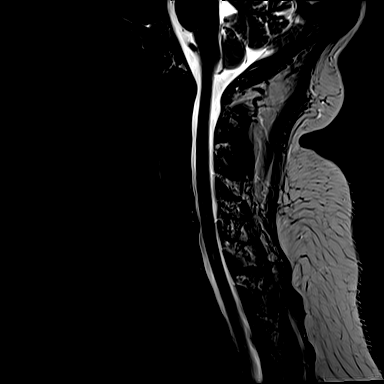
[im 9/13]
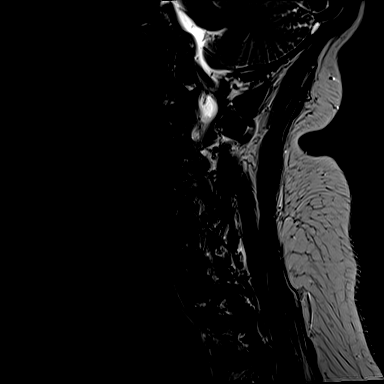
[im 11/13]
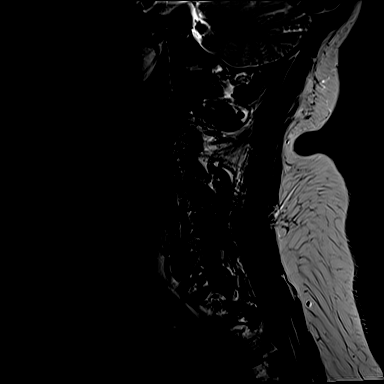
[im 13/13]
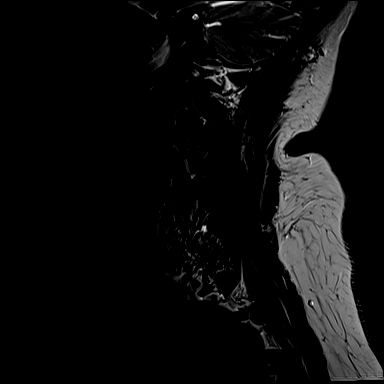

[Series 8: STIR · sagittal · 3.4mm · 0.33mm/px · 6 of 13 slices shown]
[im 1/13]
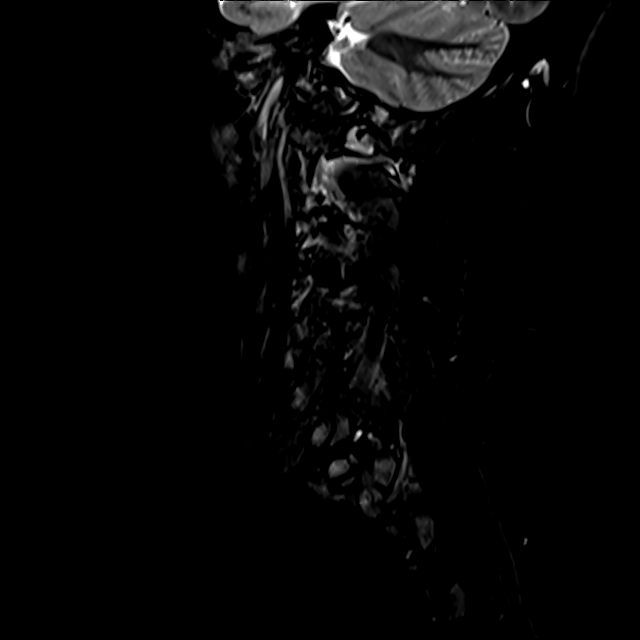
[im 3/13]
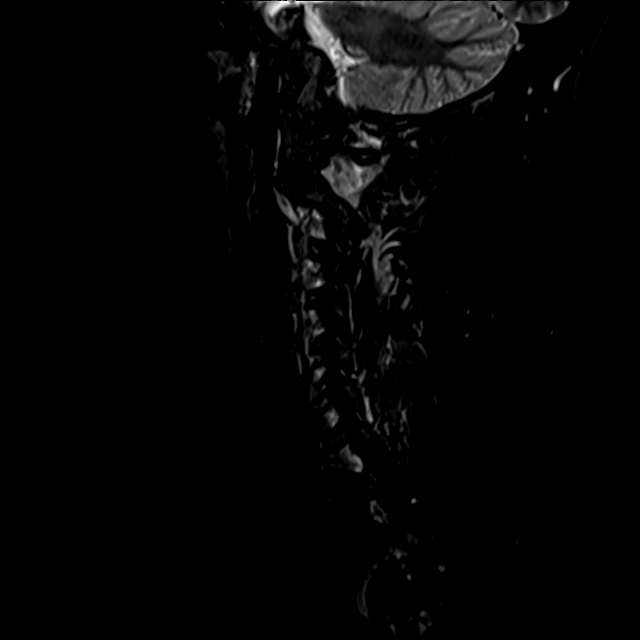
[im 5/13]
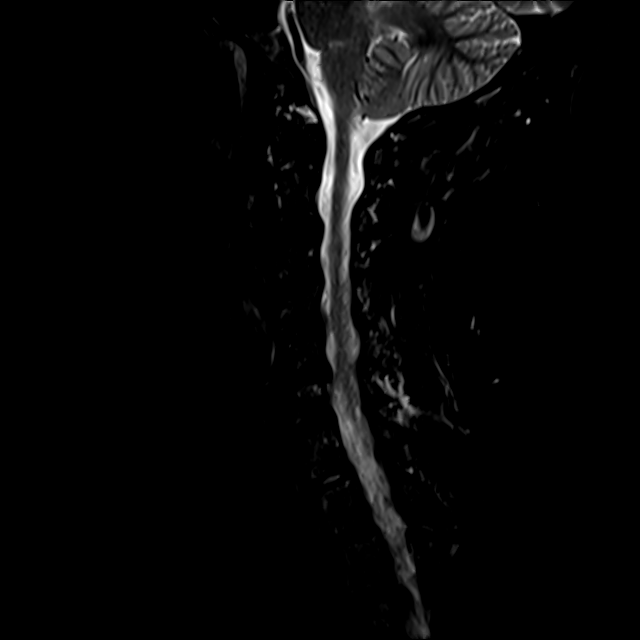
[im 7/13]
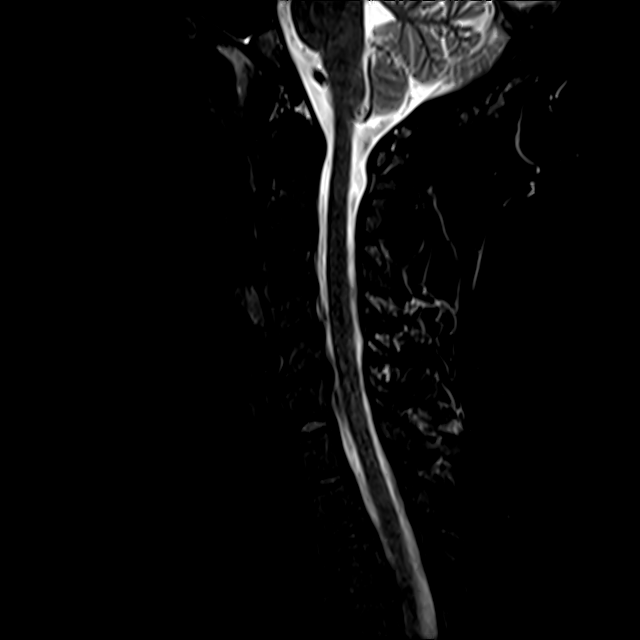
[im 9/13]
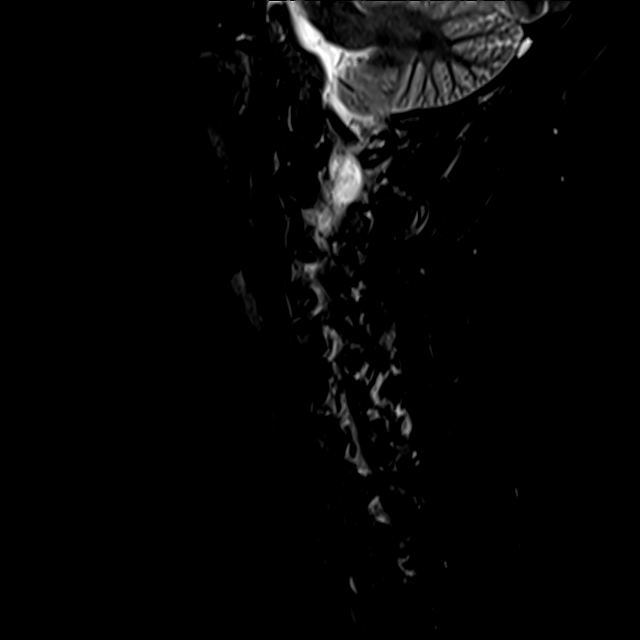
[im 11/13]
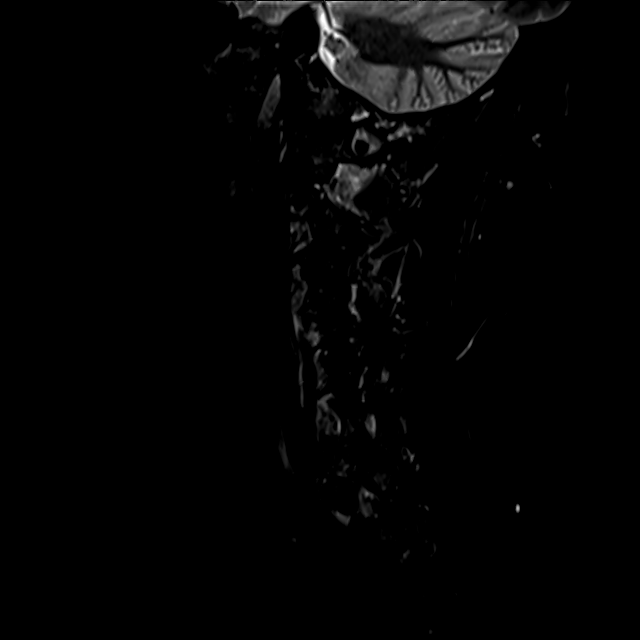

[Series 9: T2 · axial · 3.0mm · 0.53mm/px · z∈[-103,-26]mm · 8 of 26 slices shown (2 of 2)]
[im 1/26]
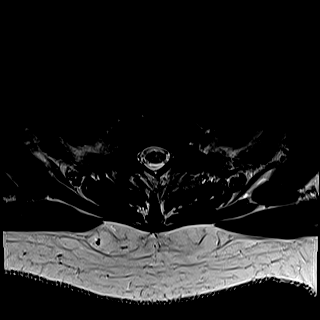
[im 4/26]
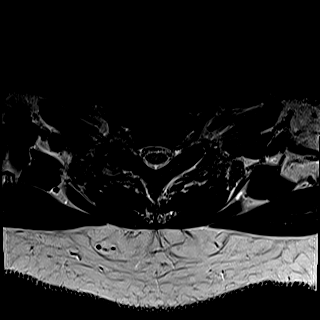
[im 8/26]
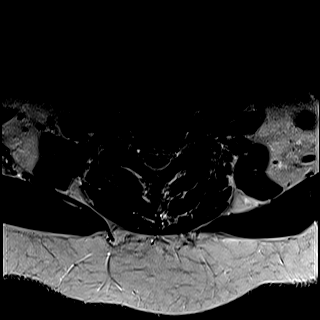
[im 12/26]
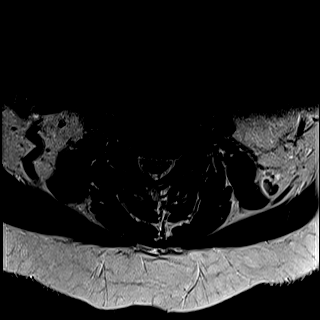
[im 14/26]
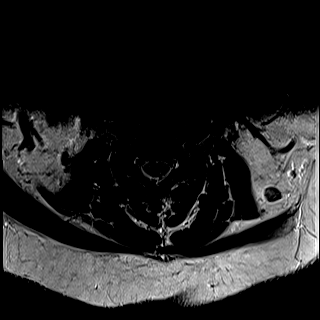
[im 18/26]
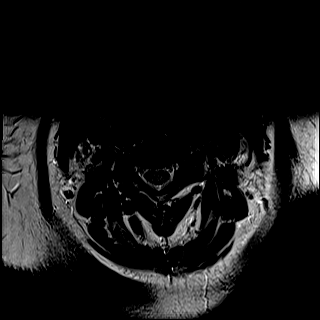
[im 22/26]
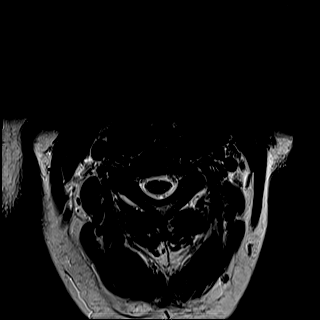
[im 26/26]
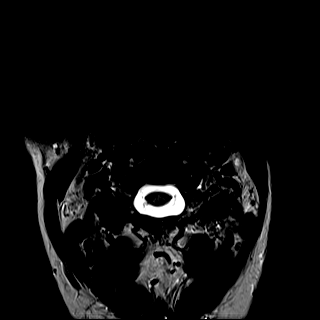

[27 of 48 positions shown; findings below may reference images not displayed]

FINDINGS: Alignment: Straightening of the normal cervical lordosis. Trace
anterolisthesis of C4 on C5, likely chronic and facet mediated.

Vertebrae: Vertebral body heights maintained without evidence for
acute or chronic fracture. Bone marrow signal intensity diffusely
decreased on T1 weighted imaging, most commonly related to anemia,
smoking, or obesity. No discrete or worrisome osseous lesions. Mild
reactive endplate changes present about the C5-6 interspace. No
other abnormal marrow edema.

Cord: Signal intensity within the cervical spinal cord is normal.

Posterior Fossa, vertebral arteries, paraspinal tissues: Visualized
brain within normal limits. Craniocervical junction normal.
Paraspinous and prevertebral soft tissues within normal limits.
Normal intravascular flow voids seen within the vertebral arteries
bilaterally.

Disc levels:

C2-C3: Unremarkable.

C3-C4: Mild right-sided uncovertebral hypertrophy with resultant
mild right C4 foraminal stenosis. No significant canal or left
foraminal narrowing.

C4-C5: Trace anterolisthesis. Minimal disc bulge with uncovertebral
hypertrophy. Left greater than right facet degeneration. No
significant canal stenosis. Moderate left with mild right C5
foraminal stenosis.

C5-C6: Diffuse degenerative disc osteophyte with intervertebral disc
space narrowing. Broad posterior component flattens and partially
faces the ventral thecal sac. Mild spinal stenosis without cord
deformity. Moderate bilateral C6 foraminal stenosis.

C6-C7:  Unremarkable.

C7-T1:  Unremarkable.

Visualized upper thoracic spine demonstrates no significant finding.
IMPRESSION: 1. Diffuse degenerative disc osteophyte at C5-6 with resultant mild
canal and moderate bilateral C6 foraminal stenosis.
2. Mild disc bulge with left greater than right facet hypertrophy at
C4-5 with resultant moderate left and mild right C5 foraminal
stenosis.
3. Right-sided uncovertebral hypertrophy at C3-4 with resultant mild
right C4 foraminal stenosis.

## 2019-09-14 ENCOUNTER — Encounter: Payer: Self-pay | Admitting: Family Medicine

## 2019-09-14 DIAGNOSIS — M545 Low back pain, unspecified: Secondary | ICD-10-CM

## 2019-09-15 MED ORDER — HYDROCODONE-ACETAMINOPHEN 5-325 MG PO TABS: 1.0000 | ORAL_TABLET | Freq: Two times a day (BID) | ORAL | 0 refills | Status: DC | PRN

## 2019-09-22 ENCOUNTER — Ambulatory Visit: Payer: Medicaid Other | Admitting: Physical Therapy

## 2019-10-03 IMAGING — XA DG INJECT/[PERSON_NAME] INC NEEDLE/CATH/PLC EPI/CERV/THOR W/IMG
2 series · 2 of 2 positions shown · non-contrast
Comparison: none

CLINICAL DATA: Cervical spondylosis without myelopathy. Neck and
bilateral upper extremity radicular pain, left greater than right.

[Series 1: ortho standard · 1 of 1 slices shown (1 of 2)]
[im 1/1]
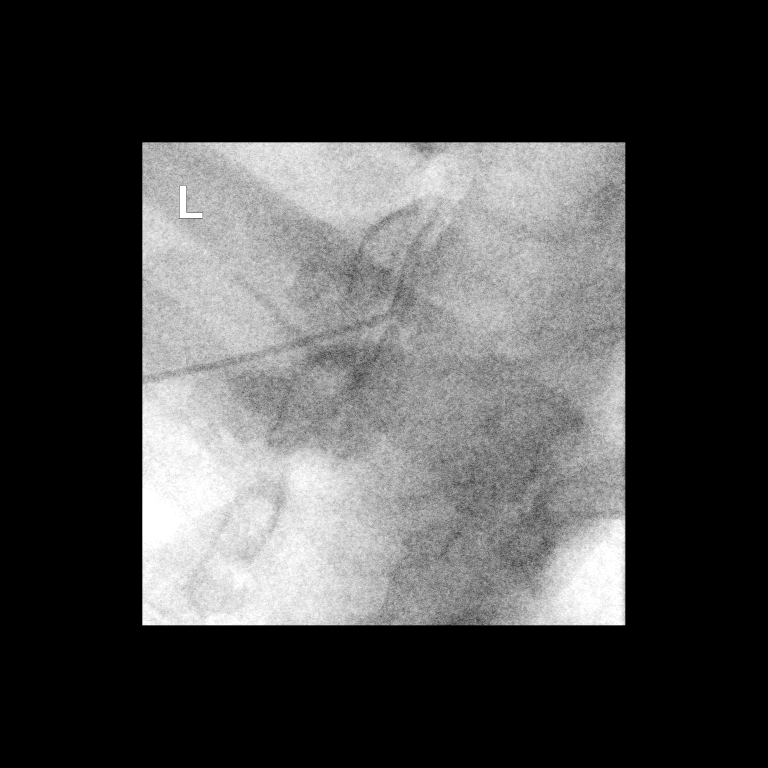

[Series 2: ortho standard · 1 of 1 slices shown (2 of 2)]
[im 1/1]
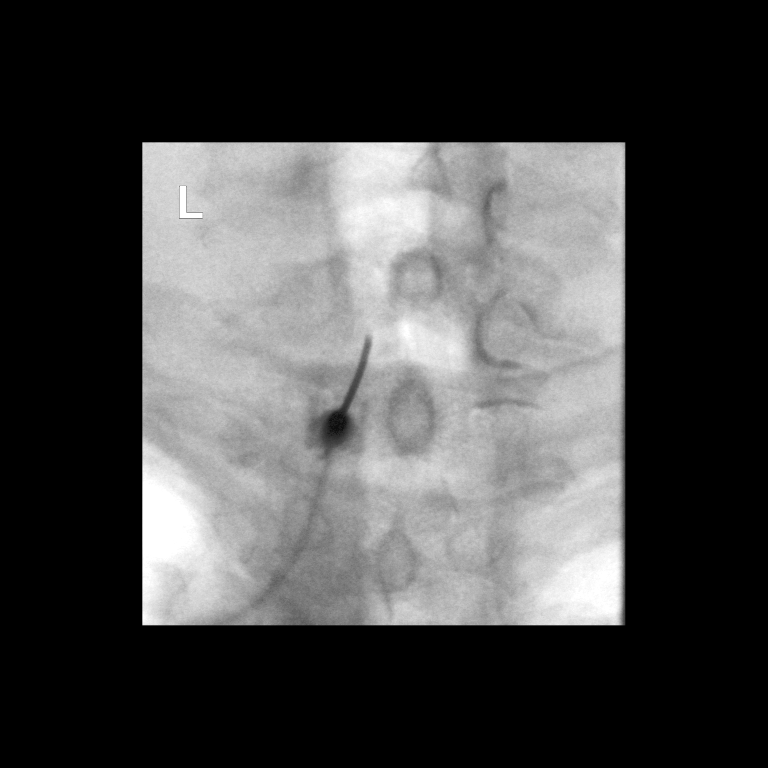

[2 of 2 positions shown; findings below may reference images not displayed]

FLUOROSCOPY TIME:  Fluoroscopy Time: 18 seconds

Radiation Exposure Index: 24.09 microGray*m^2

PROCEDURE:
CERVICAL EPIDURAL INJECTION

An interlaminar approach was performed on the left at C7-T1. A
inch 20 gauge Candia needle was advanced using loss-of-resistance
technique.

DIAGNOSTIC EPIDURAL INJECTION

Injection of Isovue-M 300 shows a good epidural pattern with spread
above and below the level of needle placement bilaterally. No
vascular opacification is seen.

THERAPEUTIC EPIDURAL INJECTION

1.5 ml of Kenalog 40 was then instilled. The procedure was
well-tolerated, and the patient was discharged thirty minutes
following the injection in good condition.
IMPRESSION: Technically successful cervical interlaminar epidural injection on
the left at C7-T1.

## 2019-10-04 ENCOUNTER — Encounter: Payer: Medicaid Other | Admitting: Physical Medicine and Rehabilitation

## 2019-10-05 ENCOUNTER — Ambulatory Visit: Payer: Medicaid Other | Attending: Family Medicine | Admitting: Physical Therapy

## 2019-10-05 ENCOUNTER — Other Ambulatory Visit: Payer: Self-pay

## 2019-10-05 ENCOUNTER — Encounter: Payer: Self-pay | Admitting: Physical Therapy

## 2019-10-05 DIAGNOSIS — M5441 Lumbago with sciatica, right side: Secondary | ICD-10-CM | POA: Insufficient documentation

## 2019-10-05 DIAGNOSIS — G8929 Other chronic pain: Secondary | ICD-10-CM | POA: Insufficient documentation

## 2019-10-05 NOTE — Therapy (Signed)
[place], [place], [address] Phone: [phone]   Fax:  [phone]  Physical Therapy Treatment  Patient Details  Name: Suzanne Wilcox MRN: [id number] Date of Birth: 1976-05-10 Referring Provider (PT): Eunice Blase, MD   Encounter Date: 10/05/2019  PT End of Session - 10/05/19 0852    Visit Number  5    Number of Visits  12    Date for PT Re-Evaluation  10/13/19    Authorization Type  Medicaid    Authorization Time Period  3/24-4/22    Authorization - Visit Number  1    Authorization - Number of Visits  8    PT Start Time  3007    PT Stop Time  0939    PT Time Calculation (min)  50 min       Past Medical History:  Diagnosis Date  . Anxiety   . Bipolar disorder ([place])   . Bipolar disorder ([place])   . Depression   . GERD (gastroesophageal reflux disease)   . Hx of varicella   . Hypertension   . Missed abortions    X 6  . Obesity 04/29/2012  . Postoperative retention of urine 03/28/2016  . Postpartum care following cesarean delivery (9/29) 03/27/2016  . Termination of pregnancy (fetus) 03/22/2004   X 1  . Vaginal Pap smear, abnormal     Past Surgical History:  Procedure Laterality Date  . CESAREAN SECTION  2011   X 1 [place]  . CESAREAN SECTION N/A 05/22/2014   Procedure: Repeat CESAREAN SECTION;  Surgeon: Lovenia Kim, MD;  Location: [facility];  Service: Obstetrics;  Laterality: N/A;  EDD: 05/31/14  . CESAREAN SECTION WITH BILATERAL TUBAL LIGATION Bilateral 03/27/2016   Procedure: Repeat CESAREAN SECTION WITH BILATERAL TUBAL LIGATION;  Surgeon: Brien Few, MD;  Location: [facility];  Service: Obstetrics;  Laterality: Bilateral;  EDD: 04/01/16  . CHOLECYSTECTOMY N/A 04/29/2017   Procedure: LAPAROSCOPIC CHOLECYSTECTOMY WITH INTRAOPERATIVE CHOLANGIOGRAM;  Surgeon: Alphonsa Overall, MD;  Location: [facility];  Service: General;  Laterality: N/A;  ERAS PATHWAY  . DILATION AND CURETTAGE OF  UTERUS   2005, 2010     x2 MAB  . ESOPHAGOGASTRODUODENOSCOPY N/A 01/11/2017   Procedure: ESOPHAGOGASTRODUODENOSCOPY (EGD);  Surgeon: Ronnette Juniper, MD;  Location: [facility];  Service: Gastroenterology;  Laterality: N/A;  . IR GENERIC HISTORICAL  03/26/2016   IR FLUORO GUIDE CV LINE RIGHT 03/26/2016 [facility]  . IR GENERIC HISTORICAL  03/26/2016   IR US GUIDE VASC ACCESS RIGHT 03/26/2016 [facility]  . WISDOM TOOTH EXTRACTION      There were no vitals filed for this visit.  Subjective Assessment - 10/05/19 0852    Subjective  Pt. returns with some exacerbation of LBP symptoms. She was supposed to have another epidural yesterday but had to cancel due to transportation issues.    Pertinent History  anxiety, bipolar, obesity, chronic LBP history    Currently in Pain?  Yes    Pain Score  6     Pain Location  Back    Pain Orientation  Right;Lower    Pain Descriptors / Indicators  Aching    Pain Type  Chronic pain    Pain Radiating Towards  right LE distal to ankle    Pain Onset  More than a month ago    Pain Frequency  Constant    Aggravating Factors   standing, stairs, prolonged supine positioning    Pain Relieving Factors  medication    Effect of Pain on Daily Activities  limits positional tolerance, sleep disturbance, difficulty with stairs                       [facility] Adult PT Treatment/Exercise - 10/05/19 0001      Lumbar Exercises: Stretches   Other Lumbar Stretch Exercise  extension in standing with belt assist/hold by therapist 2x10    Other Lumbar Stretch Exercise  supine right sciatic nerve floss 2x10      Lumbar Exercises: Aerobic   Nustep  L4 x 5 min UE/LE      Lumbar Exercises: Standing   Heel Raises  20 reps    Heel Raises Limitations  Airex    Functional Squats Limitations  partial squat at counter on Airex 2x10    Row  AROM;Strengthening;Both;20 reps    Theraband Level (Row)  Level 4 (Blue)    Row Limitations  Airex    Shoulder Extension   AROM;Strengthening;Both;20 reps    Theraband Level (Shoulder Extension)  Level 3 (Green)    Shoulder Extension Limitations  Airex      Lumbar Exercises: Supine   Pelvic Tilt  15 reps    Clam  20 reps    Clam Limitations  blue Theraband    Bent Knee Raise  20 reps    Bent Knee Raise Limitations  with posterior pelvic tilt    Bridge with Ball Squeeze  20 reps    Bridge with Ball Squeeze Limitations  partial bridge    Other Supine Lumbar Exercises  abd. bracing with alt. UE raises with 2 lb. DB x 20 reps      Lumbar Exercises: Sidelying   Other Sidelying Lumbar Exercises  left sidelying right trunk rotation 2x10      Moist Heat Therapy   Number Minutes Moist Heat  12 Minutes    Moist Heat Location  Lumbar Spine   in sitting     Manual Therapy   Manual therapy comments  hip LAD bilat. grade I-IV oscillations             PT Education - 10/05/19 0915    Education Details  POC    Person(s) Educated  Patient    Methods  Explanation    Comprehension  Verbalized understanding          PT Long Term Goals - 09/08/19 0857      PT LONG TERM GOAL #1   Title  Independent with HEP    Baseline  met for initial HEP, will continue to update prn    Time  4    Period  Weeks    Status  Achieved    Target Date  10/13/19      PT LONG TERM GOAL #2   Title  Centralize right leg pain proximal to at least knee level for improvement of standing tolerance with decreased leg pain    Baseline  symptoms distally to foot/ankle    Time  4    Period  Weeks    Status  On-going    Target Date  10/13/19      PT LONG TERM GOAL #3   Title  Tolerate standing at least 10-15 minutes with lumbar and RLE pain decreased at least 25% from baseline to improve ability for chores, IADLs at home    Baseline  initially improved to this level but noting recent exacerbation which pt. attributes to relief from injection wearing off      Time  4    Period  Weeks    Status  On-going    Target Date  10/13/19       PT LONG TERM GOAL #4   Title  Increase right LE strength at least grossly 1/2 MMT grade to improve ability to navigate stairs at home    Baseline  hip abd, flex, ext, ER 4/5, IR 4+/5, kneeflex 4+/5, ankle 4+/5    Time  4    Period  Weeks    Status  New    Target Date  10/13/19            Plan - 10/05/19 0915    Clinical Impression Statement  Some centralization of right leg symptoms but pt. continues with more localized LBP with associated functional limitations as noted in subjective with fair progress re: therapy goals.    Personal Factors and Comorbidities  Comorbidity 2    Comorbidities  anxiety/bipolar, obesity    Examination-Activity Limitations  Squat;Lift;Stairs;Stand;Sleep    Examination-Participation Restrictions  Community Activity;Shop;Cleaning    Stability/Clinical Decision Making  Evolving/Moderate complexity    Clinical Decision Making  Moderate    Rehab Potential  Fair    PT Frequency  2x / week    PT Duration  4 weeks    PT Treatment/Interventions  ADLs/Self Care Home Management;Electrical Stimulation;Moist Heat;Traction;Ultrasound;Cryotherapy;Therapeutic activities;Therapeutic exercise;Neuromuscular re-education;Manual techniques;Dry needling;Patient/family education    PT Next Visit Plan  continue extension bias trunk ROM and progress as tolerated, prn nerve glides RLE, stretch right piriformis, LAD for right hip, STM/foam roll pirifformis, gentle stabilization as tolerated    PT Home Exercise Plan  standing trunk extension (difficulty tolerating in prone due to body habitus), pelvic tilt, LTR    Consulted and Agree with Plan of Care  Patient       Patient will benefit from skilled therapeutic intervention in order to improve the following deficits and impairments:  Pain, Impaired sensation, Decreased strength, Decreased range of motion, Decreased activity tolerance, Difficulty walking  Visit Diagnosis: Chronic right-sided low back pain with right-sided  sciatica     Problem List Patient Active Problem List   Diagnosis Date Noted  . 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 Herniated cervical intervertebral disc 06/28/2017  . Fatty (change of) liver, not elsewhere classified 12/27/2016  . Postoperative retention of urine 03/28/2016  . Postpartum care following cesarean delivery (9/29) 03/27/2016  . Delivered by cesarean section 03/27/2016  . Gallbladder disease 04/30/2015  . Breech birth 05/22/2014  . Cesarean delivery indicated due to breech presentation 05/22/2014  . Bipolar I disorder, most recent episode (or current) depressed, severe, without mention of psychotic behavior 05/11/2012  . OCD (obsessive compulsive disorder) 05/11/2012  . Substance abuse ([place]) 05/11/2012    Beaulah Dinning, PT, DPT 10/05/19 9:29 AM  [facility] [address] [place], [place], [address] Phone: [phone]   Fax:  [phone]  Name: Suzanne Wilcox MRN: [id number] Date of Birth: [birth year]

## 2019-10-09 ENCOUNTER — Ambulatory Visit: Payer: Medicaid Other | Admitting: Physical Therapy

## 2019-10-09 ENCOUNTER — Other Ambulatory Visit: Payer: Self-pay

## 2019-10-09 ENCOUNTER — Encounter: Payer: Self-pay | Admitting: Physical Therapy

## 2019-10-09 DIAGNOSIS — G8929 Other chronic pain: Secondary | ICD-10-CM

## 2019-10-09 DIAGNOSIS — M5441 Lumbago with sciatica, right side: Secondary | ICD-10-CM

## 2019-10-09 NOTE — Therapy (Signed)
[place], [place], [address] Phone: [phone]   Fax:  [phone]  Physical Therapy Treatment  Patient Details  Name: Suzanne Wilcox MRN: [id number] Date of Birth: 08/26/1975 Referring Provider (PT): Eunice Blase, MD   Encounter Date: 10/09/2019  PT End of Session - 10/09/19 0851    Visit Number  6    Number of Visits  12    Date for PT Re-Evaluation  10/13/19    Authorization Type  Medicaid    Authorization Time Period  3/24-4/22    Authorization - Visit Number  2    Authorization - Number of Visits  8    PT Start Time  0845    PT Stop Time  0940    PT Time Calculation (min)  55 min       Past Medical History:  Diagnosis Date  . Anxiety   . Bipolar disorder ([place])   . Bipolar disorder ([place])   . Depression   . GERD (gastroesophageal reflux disease)   . Hx of varicella   . Hypertension   . Missed abortions    X 6  . Obesity 04/29/2012  . Postoperative retention of urine 03/28/2016  . Postpartum care following cesarean delivery (9/29) 03/27/2016  . Termination of pregnancy (fetus) 03/22/2004   X 1  . Vaginal Pap smear, abnormal     Past Surgical History:  Procedure Laterality Date  . CESAREAN SECTION  2011   X 1 [place]  . CESAREAN SECTION N/A 05/22/2014   Procedure: Repeat CESAREAN SECTION;  Surgeon: Lovenia Kim, MD;  Location: [facility];  Service: Obstetrics;  Laterality: N/A;  EDD: 05/31/14  . CESAREAN SECTION WITH BILATERAL TUBAL LIGATION Bilateral 03/27/2016   Procedure: Repeat CESAREAN SECTION WITH BILATERAL TUBAL LIGATION;  Surgeon: Brien Few, MD;  Location: [facility];  Service: Obstetrics;  Laterality: Bilateral;  EDD: 04/01/16  . CHOLECYSTECTOMY N/A 04/29/2017   Procedure: LAPAROSCOPIC CHOLECYSTECTOMY WITH INTRAOPERATIVE CHOLANGIOGRAM;  Surgeon: Alphonsa Overall, MD;  Location: [facility];  Service: General;  Laterality: N/A;  ERAS PATHWAY  . DILATION AND CURETTAGE  OF UTERUS   2005, 2010     x2 [facility]AB  . ESOPHAGOGASTRODUODENOSCOPY N/A 01/11/2017   Procedure: ESOPHAGOGASTRODUODENOSCOPY (EGD);  Surgeon: Ronnette Juniper, MD;  Location: [facility];  Service: Gastroenterology;  Laterality: N/A;  . IR GENERIC HISTORICAL  03/26/2016   IR FLUORO GUIDE CV LINE RIGHT 03/26/2016 [facility]  . IR GENERIC HISTORICAL  03/26/2016   IR US GUIDE VASC ACCESS RIGHT 03/26/2016 [facility]  . WISDOM TOOTH EXTRACTION      There were no vitals filed for this visit.  Subjective Assessment - 10/09/19 0849    Subjective  7/10 pretty sore, throbbing. The epidural injection has worn off.    Currently in Pain?  Yes    Pain Score  7     Pain Location  Back    Pain Orientation  Right;Lower    Pain Descriptors / Indicators  Throbbing;Sore    Pain Type  Chronic pain    Aggravating Factors   stanidng, stairs, prolonged supine positions, lean sideways.    Pain Relieving Factors  avoid aggravating positions                       [facility] Adult PT Treatment/Exercise - 10/09/19 0001      Lumbar Exercises: Stretches   Piriformis Stretch  Right;Left;3 reps;20 seconds  Lumbar Exercises: Aerobic   Nustep  L5 x 5 min UE/LE      Lumbar Exercises: Standing   Heel Raises  20 reps    Heel Raises Limitations  Airex    Functional Squats Limitations  partial squat at counter on Airex 2x10    Row  AROM;Strengthening;Both;20 reps    Theraband Level (Row)  Level 4 (Blue)    Row Limitations  Airex    Shoulder Extension  AROM;Strengthening;Both;20 reps    Theraband Level (Shoulder Extension)  Level 4 (Blue)    Shoulder Extension Limitations  Airex    Other Standing Lumbar Exercises  Hip abd SLR in standing x 10 ea. bilat., hip extension x 10 each    on airex      Lumbar Exercises: Supine   Pelvic Tilt  15 reps    Clam  20 reps    Clam Limitations  blue Theraband    Bent Knee Raise  20 reps    Bent Knee Raise Limitations  with posterior pelvic tilt    Bridge with [employer]  20 reps    Bridge with [employer] Limitations  partial bridge    Bridge with clamshell  15 reps    Bridge with [employer] Limitations  Blue     Other Supine Lumbar Exercises  abd. bracing with alt. UE raises with 2 lb. DB x 20 reps      Lumbar Exercises: Sidelying   Clam Limitations  unable-pain     Other Sidelying Lumbar Exercises  left sidelying right trunk rotation 2x10      Moist Heat Therapy   Number Minutes Moist Heat  15 Minutes    Moist Heat Location  Lumbar Spine      Manual Therapy   Soft tissue mobilization  roller right. piriformis, glut in sidelying                  PT Long Term Goals - 09/08/19 0857      PT LONG TERM GOAL #1   Title  Independent with HEP    Baseline  met for initial HEP, will continue to update prn    Time  4    Period  Weeks    Status  Achieved    Target Date  10/13/19      PT LONG TERM GOAL #2   Title  Centralize right leg pain proximal to at least knee level for improvement of standing tolerance with decreased leg pain    Baseline  symptoms distally to foot/ankle    Time  4    Period  Weeks    Status  On-going    Target Date  10/13/19      PT LONG TERM GOAL #3   Title  Tolerate standing at least 10-15 minutes with lumbar and RLE pain decreased at least 25% from baseline to improve ability for chores, IADLs at home    Baseline  initially improved to this level but noting recent exacerbation which pt. attributes to relief from injection wearing off    Time  4    Period  Weeks    Status  On-going    Target Date  10/13/19      PT LONG TERM GOAL #4   Title  Increase right LE strength at least grossly 1/2 MMT grade to improve ability to navigate stairs at home    Baseline  hip abd, flex, ext, ER 4/5, IR 4+/5, kneeflex 4+/5, ankle 4+/5  Time  4    Period  Weeks    Status  New    Target Date  10/13/19            Plan - 10/09/19 6244    Clinical Impression Statement  Less pain in RLE in past week. Pain  located in right posterior hip and low back today. Continued with Lumbar stabilization as previous. Attempted side lying clam which was too painful. HMP at end of session.    PT Next Visit Plan  Re-eval POC; continue extension bias trunk ROM and progress as tolerated, prn nerve glides RLE, stretch right piriformis, LAD for right hip, STM/foam roll pirifformis, gentle stabilization as tolerated    PT Home Exercise Plan  standing trunk extension (difficulty tolerating in prone due to body habitus), pelvic tilt, LTR       Patient will benefit from skilled therapeutic intervention in order to improve the following deficits and impairments:  Pain, Impaired sensation, Decreased strength, Decreased range of motion, Decreased activity tolerance, Difficulty walking  Visit Diagnosis: Chronic right-sided low back pain with right-sided sciatica     Problem List Patient Active Problem List   Diagnosis Date Noted  . 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 Herniated cervical intervertebral disc 06/28/2017  . Fatty (change of) liver, not elsewhere classified 12/27/2016  . Postoperative retention of urine 03/28/2016  . Postpartum care following cesarean delivery (9/29) 03/27/2016  . Delivered by cesarean section 03/27/2016  . Gallbladder disease 04/30/2015  . Breech birth 05/22/2014  . Cesarean delivery indicated due to breech presentation 05/22/2014  . Bipolar I disorder, most recent episode (or current) depressed, severe, without mention of psychotic behavior 05/11/2012  . OCD (obsessive compulsive disorder) 05/11/2012  . Substance abuse ([place]) 05/11/2012    Dorene Ar, PTA 10/09/2019, 9:30 AM  [place], [place], [address] Phone: [phone]   Fax:  [phone]  Name: Suzanne Wilcox MRN: [id number] Date of Birth: [birth year]

## 2019-10-11 ENCOUNTER — Other Ambulatory Visit: Payer: Self-pay

## 2019-10-11 ENCOUNTER — Ambulatory Visit: Payer: Medicaid Other | Admitting: Physical Medicine and Rehabilitation

## 2019-10-11 ENCOUNTER — Encounter: Payer: Self-pay | Admitting: Physical Medicine and Rehabilitation

## 2019-10-11 VITALS — BP 152/90 | HR 107 | Ht 68.0 in | Wt 349.0 lb

## 2019-10-11 DIAGNOSIS — M47816 Spondylosis without myelopathy or radiculopathy, lumbar region: Secondary | ICD-10-CM | POA: Diagnosis not present

## 2019-10-11 DIAGNOSIS — G8929 Other chronic pain: Secondary | ICD-10-CM

## 2019-10-11 DIAGNOSIS — M5416 Radiculopathy, lumbar region: Secondary | ICD-10-CM | POA: Diagnosis not present

## 2019-10-11 DIAGNOSIS — M5116 Intervertebral disc disorders with radiculopathy, lumbar region: Secondary | ICD-10-CM | POA: Diagnosis not present

## 2019-10-11 DIAGNOSIS — M5441 Lumbago with sciatica, right side: Secondary | ICD-10-CM

## 2019-10-11 NOTE — Progress Notes (Signed)
 .  Numeric Pain Rating Scale and Functional Assessment Average Pain 6 Pain Right Now 5 My pain is intermittent, sharp and aching Pain is worse with: walking, bending and some activites Pain improves with: therapy/exercise   In the last MONTH (on 0-10 scale) has pain interfered with the following?  1. General activity like being  able to carry out your everyday physical activities such as walking, climbing stairs, carrying groceries, or moving a chair?  Rating(7)  2. Relation with others like being able to carry out your usual social activities and roles such as  activities at home, at work and in your community. Rating(7)  3. Enjoyment of life such that you have  been bothered by emotional problems such as feeling anxious, depressed or irritable?  Rating(4)

## 2019-10-13 ENCOUNTER — Ambulatory Visit: Payer: Medicaid Other | Admitting: Physical Therapy

## 2019-10-15 NOTE — Progress Notes (Signed)
Suzanne Wilcox - 44 y.o. female MRN [id number]  Date of birth: 07/07/1975  Office Visit Note: Visit Date: 10/11/2019 PCP: Maurice Small, MD Referred by: Maurice Small, MD  Subjective: Chief Complaint  Patient presents with  . Lower Back - Pain   HPI: Suzanne Wilcox is a 44 y.o. female who comes in today Relation management of chronic worsening low back pain with some referral in the right more than left buttocks without focal weakness or paresthesia.  By way of quick review we completed an initial injection prior to MRI of the lumbar spine and we did complete this at L5-S1 and she actually got tremendous amount relief for several months.  Second injection was actually performed at the same level and it just did not do as well but it did help but it lasted about 3 weeks.  She noted that the injection itself was incredibly painful she did have some pain into the right hip as we are doing the injection which I did note on that procedure report.  She has had physical therapy sessions and she felt like that made it worse.  She does get some relief with sitting on a baseball and stretching.  She rates her pain as a 6 out of 10 and is intermittent sharp and aching pain worse with walking and bending.  She has had no focal weakness no bowel or bladder difficulty etc.  Her case is complicated by morbid obesity.  I did review the MRI with her using spine models.  We did spend more than 40 minutes on the evaluation management and talking about potential treatment plans for her.  Review of Systems  Musculoskeletal: Positive for back pain.  All other systems reviewed and are negative.  Otherwise per HPI.  Assessment & Plan: Visit Diagnoses:  1. Lumbar radiculopathy   2. Radiculopathy due to lumbar intervertebral disc disorder   3. Spondylosis without myelopathy or radiculopathy, lumbar region   4. Chronic right-sided low back pain with right-sided sciatica     Plan: Findings:    Chronic worsening severe low back and right hip and buttock pain consistent with MRI imaging showing changes at L4-5 and in particular L5-S1 with disc extrusion.  I did show her this on the spine model and it did fit with what we thought when we first saw her.  Prior injections were completed at L5-S1 on the right.  Second injection seem to cause her more difficulty when we were doing the injection.  I did recommend another injection for her as the first 1 seemed to help quite a bit.  She has been through physical therapy and everything else.  Her case is complicated by morbid obesity.  We talked about activity modification and lifting issues particular with her children.  I did try to talk to her at great length about not necessarily worrying about the pain that was involved with the second injection that we likely could do that and make it not the same way as the first injection she did without any difficulty at all and that just seems to happen at times.  There is a lot of variables in terms of pain felt during the injection itself.  At this point she is going to try to complete activity modification and continue with home exercise and think about whether to do another injection.  We did talk about numbers of injections and sort of the myths involved with cortisone etc. and again we did speak for over  40 minutes today on her case.    Meds & Orders: No orders of the defined types were placed in this encounter.  No orders of the defined types were placed in this encounter.   Follow-up: Return if symptoms worsen or fail to improve.   Procedures: No procedures performed  No notes on file   Clinical History: MRI LUMBAR SPINE WITHOUT CONTRAST  TECHNIQUE: Multiplanar, multisequence MR imaging of the lumbar spine was performed. No intravenous contrast was administered.  COMPARISON:  Lumbar radiographs 05/06/2016  FINDINGS: Segmentation: There are five lumbar type vertebral bodies. The  last full intervertebral disc space is labeled L5-S1.  Alignment:  Normal  Vertebrae: Diffuse low T1 signal intensity without discrete lesion. Findings typically seen with anemia, smoking or obesity. Mild endplate reactive changes are noted. No fractures.  Conus medullaris and cauda equina: Conus extends to the L1 level. Conus and cauda equina appear normal.  Paraspinal and other soft tissues: No significant paraspinal or retroperitoneal findings.  Disc levels:  T12-L1: Small central disc protrusion. No foraminal stenosis.  L1-2: No significant findings.  L2-3: Shallow broad-based right foraminal disc protrusion contacting and displacing the right L2 nerve root. There is also mild right lateral recess encroachment potentially irritating the right L3 nerve root.  L3-4: Bulging annulus and moderate facet disease but no spinal or foraminal stenosis. Shallow broad-based extraforaminal disc protrusion on the left potentially irritating the extraforaminal L3 nerve root.  L4-5: Focal central and slightly right paracentral disc protrusion with mass effect on the thecal sac and likely affecting the right L5 nerve root in the lateral recess. No foraminal stenosis. Moderate facet disease.  L5-S1: Moderate-sized right-sided disc extrusion with disc material coursing up behind the L5 vertebral body almost all the way to the L4-5 disc space. There is significant mass effect on the right side of the thecal sac and moderate mass effect on the right L5 and S1 nerve roots. Moderate to advanced facet disease.  IMPRESSION: 1. Shallow broad-based right foraminal disc protrusion at L2-3. 2. Shallow broad-based extraforaminal disc protrusion on the left at L3-4. 3. Focal central and slightly right paracentral disc protrusion at L4-5 likely affecting the right L5 nerve root in the lateral recess. 4. Moderate to large right-sided disc extrusion at L5-S1 with disc material coursing  up behind the L5 vertebral body almost all the way to the L4-5 disc space. Significant mass effect on the thecal sac and on the right L5 and S1 nerve roots.   Electronically Signed   By: Rudie Meyer M.D.   On: 08/30/2019 10:44   She reports that she quit smoking about 10 years ago. Her smoking use included cigarettes. She has a 36.00 pack-year smoking history. She has never used smokeless tobacco. No results for input(s): HGBA1C, LABURIC in the last 8760 hours.  Objective:  VS:  HT:5\' 8"  (172.7 cm)   WT:(!) 349 lb (158.3 kg)  BMI:53.08    BP:(!) 152/90  HR:(!) 107bpm  TEMP: ( )  RESP:  Physical Exam Vitals and nursing note reviewed.  Constitutional:      General: She is not in acute distress.    Appearance: Normal appearance. She is well-developed. She is obese. She is not ill-appearing.  HENT:     Head: Normocephalic and atraumatic.  Eyes:     Conjunctiva/sclera: Conjunctivae normal.     Pupils: Pupils are equal, round, and reactive to light.  Cardiovascular:     Rate and Rhythm: Normal rate.     Pulses:  Normal pulses.  Pulmonary:     Effort: Pulmonary effort is normal.  Musculoskeletal:        General: Tenderness present.     Right lower leg: No edema.     Left lower leg: No edema.     Comments: Patient has some pain going from sit to stand.  She does have pain over the right PSIS.  She has a positive slump test on the right and good distal strength.  Skin:    General: Skin is warm and dry.     Findings: No erythema or rash.  Neurological:     General: No focal deficit present.     Mental Status: She is alert and oriented to person, place, and time.     Sensory: No sensory deficit.     Motor: No abnormal muscle tone.     Coordination: Coordination normal.     Gait: Gait normal.  Psychiatric:        Mood and Affect: Mood normal.        Behavior: Behavior normal.     Ortho Exam Imaging: No results found.  Past Medical/Family/Surgical/Social  History: Medications & Allergies reviewed per EMR, new medications updated. Patient Active Problem List   Diagnosis Date Noted  . 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 Herniated cervical intervertebral disc 06/28/2017  . Fatty (change of) liver, not elsewhere classified 12/27/2016  . Postoperative retention of urine 03/28/2016  . Postpartum care following cesarean delivery (9/29) 03/27/2016  . Delivered by cesarean section 03/27/2016  . Gallbladder disease 04/30/2015  . Breech birth 05/22/2014  . Cesarean delivery indicated due to breech presentation 05/22/2014  . Bipolar I disorder, most recent episode (or current) depressed, severe, without mention of psychotic behavior 05/11/2012  . OCD (obsessive compulsive disorder) 05/11/2012  . Substance abuse (HCC) 05/11/2012   Past Medical History:  Diagnosis Date  . Anxiety   . Bipolar disorder (HCC)   . Bipolar disorder (HCC)   . Depression   . GERD (gastroesophageal reflux disease)   . Hx of varicella   . Hypertension   . Missed abortions    X 6  . Obesity 04/29/2012  . Postoperative retention of urine 03/28/2016  . Postpartum care following cesarean delivery (9/29) 03/27/2016  . Termination of pregnancy (fetus) 03/22/2004   X 1  . Vaginal Pap smear, abnormal    Family History  Problem Relation Age of Onset  . Depression Mother   . Skin cancer Mother   . Depression Paternal Grandmother   . Pancreatic cancer Father   . Kidney Stones Father   . Stroke Father   . Dementia Father   . Skin cancer Sister    Past Surgical History:  Procedure Laterality Date  . CESAREAN SECTION  2011   X 1 [facility] [place]  . CESAREAN SECTION N/A 05/22/2014   Procedure: Repeat CESAREAN SECTION;  Surgeon: Lenoard Aden, MD;  Location: [facility];  Service: Obstetrics;  Laterality: N/A;  EDD: 05/31/14  . CESAREAN SECTION WITH BILATERAL TUBAL LIGATION Bilateral 03/27/2016   Procedure: Repeat CESAREAN SECTION WITH BILATERAL TUBAL LIGATION;  Surgeon:  Olivia Mackie, MD;  Location: [facility];  Service: Obstetrics;  Laterality: Bilateral;  EDD: 04/01/16  . CHOLECYSTECTOMY N/A 04/29/2017   Procedure: LAPAROSCOPIC CHOLECYSTECTOMY WITH INTRAOPERATIVE CHOLANGIOGRAM;  Surgeon: Ovidio Kin, MD;  Location: [facility];  Service: General;  Laterality: N/A;  ERAS PATHWAY  . DILATION AND CURETTAGE OF UTERUS   2005, 2010     x2  [facility]  . ESOPHAGOGASTRODUODENOSCOPY N/A 01/11/2017   Procedure: ESOPHAGOGASTRODUODENOSCOPY (EGD);  Surgeon: Ronnette Juniper, MD;  Location: [facility];  Service: Gastroenterology;  Laterality: N/A;  . IR GENERIC HISTORICAL  03/26/2016   IR FLUORO GUIDE CV LINE RIGHT 03/26/2016 [facility]  . IR GENERIC HISTORICAL  03/26/2016   IR US GUIDE VASC ACCESS RIGHT 03/26/2016 [facility]  . WISDOM TOOTH EXTRACTION     Social History   Occupational History  . Not on file  Tobacco Use  . Smoking status: Former Smoker    Packs/day: 2.00    Years: 18.00    Pack years: 36.00    Types: Cigarettes    Quit date: 04/29/2009    Years since quitting: 10.4  . Smokeless tobacco: Never Used  Substance and Sexual Activity  . Alcohol use: No    Comment: OCCASION  . Drug use: No  . Sexual activity: Not on file

## 2019-11-02 NOTE — Therapy (Signed)
[place], [place], [address] Phone: [phone]   Fax:  [phone]  Physical Therapy Treatment/Discharge  Patient Details  Name: Suzanne Wilcox MRN: [id number] Date of Birth: March 30, 1976 Referring Provider (PT): Eunice Blase, MD   Encounter Date: 10/09/2019    Past Medical History:  Diagnosis Date  . Anxiety   . Bipolar disorder ([place])   . Bipolar disorder ([place])   . Depression   . GERD (gastroesophageal reflux disease)   . Hx of varicella   . Hypertension   . Missed abortions    X 6  . Obesity 04/29/2012  . Postoperative retention of urine 03/28/2016  . Postpartum care following cesarean delivery (9/29) 03/27/2016  . Termination of pregnancy (fetus) 03/22/2004   X 1  . Vaginal Pap smear, abnormal     Past Surgical History:  Procedure Laterality Date  . CESAREAN SECTION  2011   X 1 [place]  . CESAREAN SECTION N/A 05/22/2014   Procedure: Repeat CESAREAN SECTION;  Surgeon: Lovenia Kim, MD;  Location: [facility];  Service: Obstetrics;  Laterality: N/A;  EDD: 05/31/14  . CESAREAN SECTION WITH BILATERAL TUBAL LIGATION Bilateral 03/27/2016   Procedure: Repeat CESAREAN SECTION WITH BILATERAL TUBAL LIGATION;  Surgeon: Brien Few, MD;  Location: [facility];  Service: Obstetrics;  Laterality: Bilateral;  EDD: 04/01/16  . CHOLECYSTECTOMY N/A 04/29/2017   Procedure: LAPAROSCOPIC CHOLECYSTECTOMY WITH INTRAOPERATIVE CHOLANGIOGRAM;  Surgeon: Alphonsa Overall, MD;  Location: [facility];  Service: General;  Laterality: N/A;  ERAS PATHWAY  . DILATION AND CURETTAGE OF UTERUS   2005, 2010     x2 MAB  . ESOPHAGOGASTRODUODENOSCOPY N/A 01/11/2017   Procedure: ESOPHAGOGASTRODUODENOSCOPY (EGD);  Surgeon: Ronnette Juniper, MD;  Location: [facility];  Service: Gastroenterology;  Laterality: N/A;  . IR GENERIC HISTORICAL  03/26/2016   IR FLUORO GUIDE CV LINE RIGHT 03/26/2016 [facility]  . IR GENERIC HISTORICAL   03/26/2016   IR US GUIDE VASC ACCESS RIGHT 03/26/2016 [facility]  . WISDOM TOOTH EXTRACTION      There were no vitals filed for this visit.                                 PT Long Term Goals - 09/08/19 0857      PT LONG TERM GOAL #1   Title  Independent with HEP    Baseline  met for initial HEP, will continue to update prn    Time  4    Period  Weeks    Status  Achieved    Target Date  10/13/19      PT LONG TERM GOAL #2   Title  Centralize right leg pain proximal to at least knee level for improvement of standing tolerance with decreased leg pain    Baseline  symptoms distally to foot/ankle    Time  4    Period  Weeks    Status  On-going    Target Date  10/13/19      PT LONG TERM GOAL #3   Title  Tolerate standing at least 10-15 minutes with lumbar and RLE pain decreased at least 25% from baseline to improve ability for chores, IADLs at home    Baseline  initially improved to this level but noting recent exacerbation which pt. attributes to relief from injection wearing off    Time  4    Period  Weeks  Status  On-going    Target Date  10/13/19      PT LONG TERM GOAL #4   Title  Increase right LE strength at least grossly 1/2 MMT grade to improve ability to navigate stairs at home    Baseline  hip abd, flex, ext, ER 4/5, IR 4+/5, kneeflex 4+/5, ankle 4+/5    Time  4    Period  Weeks    Status  New    Target Date  10/13/19              Patient will benefit from skilled therapeutic intervention in order to improve the following deficits and impairments:  Pain, Impaired sensation, Decreased strength, Decreased range of motion, Decreased activity tolerance, Difficulty walking  Visit Diagnosis: Chronic right-sided low back pain with right-sided sciatica     Problem List Patient Active Problem List   Diagnosis Date Noted  . 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 Herniated cervical intervertebral disc 06/28/2017  . Fatty  (change of) liver, not elsewhere classified 12/27/2016  . Postoperative retention of urine 03/28/2016  . Postpartum care following cesarean delivery (9/29) 03/27/2016  . Delivered by cesarean section 03/27/2016  . Gallbladder disease 04/30/2015  . Breech birth 05/22/2014  . Cesarean delivery indicated due to breech presentation 05/22/2014  . Bipolar I disorder, most recent episode (or current) depressed, severe, without mention of psychotic behavior 05/11/2012  . OCD (obsessive compulsive disorder) 05/11/2012  . Substance abuse ([place]) 05/11/2012      PHYSICAL THERAPY DISCHARGE SUMMARY  Visits from Start of Care: 6  Current functional level related to goals / functional outcomes: Patient contacted clinic and cancelled remaining therapy visits-she reports no further therapy needed per MD.   Remaining deficits: NA   Education / Equipment: NA Plan: Patient agrees to discharge.  Patient goals were not met. Patient is being discharged due to the physician's request.  ?????           Beaulah Dinning, PT, DPT 11/02/19 4:36 PM       [place], [place], [address] Phone: [phone]   Fax:  [phone]  Name: Suzanne Wilcox MRN: [id number] Date of Birth: 21-Feb-1976

## 2020-03-28 ENCOUNTER — Ambulatory Visit
Admission: EM | Admit: 2020-03-28 | Discharge: 2020-03-28 | Disposition: A | Discharge status: Home / Self Care | Payer: Medicaid Other | Attending: Emergency Medicine | Admitting: Emergency Medicine

## 2020-03-28 ENCOUNTER — Other Ambulatory Visit: Payer: Self-pay

## 2020-03-28 DIAGNOSIS — Z1152 Encounter for screening for COVID-19: Secondary | ICD-10-CM

## 2020-03-28 NOTE — ED Triage Notes (Signed)
Pt daughter requires a covid test for school and pt is asymptomatic. Pt is aox4 and ambulatory.

## 2020-03-28 NOTE — Discharge Instructions (Signed)

## 2020-03-30 LAB — SARS-COV-2, NAA 2 DAY TAT

## 2020-03-30 LAB — NOVEL CORONAVIRUS, NAA: SARS-CoV-2, NAA: NOT DETECTED

## 2020-04-08 ENCOUNTER — Telehealth: Payer: Self-pay | Admitting: Family Medicine

## 2020-04-08 NOTE — Telephone Encounter (Signed)
[employer] forms received. Sent to [facility] 

## 2020-09-30 ENCOUNTER — Other Ambulatory Visit (HOSPITAL_BASED_OUTPATIENT_CLINIC_OR_DEPARTMENT_OTHER): Payer: Self-pay

## 2020-09-30 DIAGNOSIS — G4733 Obstructive sleep apnea (adult) (pediatric): Secondary | ICD-10-CM

## 2020-12-16 ENCOUNTER — Other Ambulatory Visit: Payer: Self-pay

## 2020-12-16 ENCOUNTER — Ambulatory Visit (HOSPITAL_BASED_OUTPATIENT_CLINIC_OR_DEPARTMENT_OTHER): Payer: Medicaid Other | Attending: Internal Medicine | Admitting: Sleep Medicine

## 2020-12-16 VITALS — Ht 68.0 in | Wt 350.0 lb

## 2020-12-16 DIAGNOSIS — G4733 Obstructive sleep apnea (adult) (pediatric): Secondary | ICD-10-CM | POA: Insufficient documentation

## 2020-12-16 DIAGNOSIS — R0683 Snoring: Secondary | ICD-10-CM | POA: Diagnosis present

## 2020-12-16 DIAGNOSIS — G4719 Other hypersomnia: Secondary | ICD-10-CM

## 2020-12-17 ENCOUNTER — Other Ambulatory Visit (HOSPITAL_BASED_OUTPATIENT_CLINIC_OR_DEPARTMENT_OTHER): Payer: Self-pay

## 2020-12-17 DIAGNOSIS — G4733 Obstructive sleep apnea (adult) (pediatric): Secondary | ICD-10-CM

## 2020-12-23 NOTE — Procedures (Signed)
   NAME: Suzanne Wilcox DATE OF BIRTH:  [birth year] MEDICAL RECORD NUMBER [id number]  LOCATION: [facility]  PHYSICIAN:  D   DATE OF STUDY: 12/16/2020  SLEEP STUDY TYPE: Nocturnal Polysomnogram               REFERRING PHYSICIAN: Deretha Emory, MD  EPWORTH SLEEPINESS SCORE:   HEIGHT: 5\' 8"  (172.7 cm)  WEIGHT: (!) 350 lb (158.8 kg)    Body mass index is 53.22 kg/m.  NECK SIZE: 16 in.  CLINICAL INFORMATION Suzanne Wilcox is a 45 year old Female and was referred to the sleep center for evaluation of G47.33 OSA: Adult and Pediatric (327.23). Indications include reassessment of OSA.  MEDICATIONS Patient self administered medications include: COQ10, Advil, Lotensin, Prilosec, Seroquel. No sleep medicine administered.  SLEEP STUDY TECHNIQUE A multi-channel overnight Polysomnography study was performed. The channels recorded and monitored were central and occipital EEG, electrooculogram (EOG), submentalis EMG (chin), nasal and oral airflow, thoracic and abdominal wall motion, anterior tibialis EMG, snore microphone, electrocardiogram, and a pulse oximetry. SLEEP ARCHITECTURE The study was initiated at 10:29:51 PM and terminated at 4:49:02 AM. The total recorded time was 379.2 minutes. EEG confirmed total sleep time was 283 minutes yielding a sleep efficiency of 74.6%%. Sleep onset after lights out was 52.6 minutes with a REM latency of 50.0 minutes. The patient spent 5.8%% of the night in stage N1 sleep, 71.9%% in stage N2 sleep, 0.0%% in stage N3 and 22.3% in REM. Wake after sleep onset (WASO) was 43.6 minutes. The Arousal Index was 24.2/hour. RESPIRATORY PARAMETERS There were a total of 34 respiratory disturbances out of which 0 were apneas (0 obstructive, 0 mixed, 0 central) and 34 hypopneas. The apnea/hypopnea index (AHI) was 7.2 events/hour. The central sleep apnea index was 0 events/hour. The REM AHI was 26.7 events/hour and NREM AHI was 1.6  events/hour. The supine AHI was 7.8 events/hour and the non supine AHI was 7 supine during 24.38% of sleep. Respiratory disturbance index was 8.3, and associated with oxygen desaturation down to a nadir of 82.0% during sleep. The mean oxygen saturation during the study was 94.5%. The cumulative time under 88% oxygen saturation was 1.7 minutes.  LEG MOVEMENT DATA The total leg movements were 347 with a resulting leg movement index of 73.6/hr .Associated arousal with leg movement index was 14.2/hr.  CARDIAC DATA The underlying cardiac rhythm was most consistent with sinus rhythm. Mean heart rate during sleep was 103.9 bpm. Additional rhythm abnormalities include PVCs. IMPRESSIONS - Mild Obstructive Sleep apnea(OSA) - Electrocardiographic data showed presence of PVCs. DIAGNOSIS - Mild Obstructive Sleep Apnea (G47.33) RECOMMENDATIONS - Recommend a trial of oral appliance or auto adjusting positive pressure therapy.  - Avoid alcohol, sedatives and other CNS depressants that may worsen sleep apnea and disrupt normal sleep architecture. - Sleep hygiene should be reviewed to assess factors that may improve sleep quality. - Weight management and regular exercise should be initiated or continued.  06-22-1987  Sleep Specialist, American Board of Internal Medicine  ELECTRONICALLY SIGNED ON:  12/23/2020, 1:03 PM [facility] PH: [phone]   FX: [phone] ACCREDITED BY THE AMERICAN ACADEMY OF SLEEP MEDICINE

## 2021-07-14 ENCOUNTER — Ambulatory Visit: Payer: Self-pay | Admitting: Internal Medicine

## 2021-07-28 ENCOUNTER — Ambulatory Visit: Payer: Self-pay

## 2021-07-28 NOTE — Telephone Encounter (Signed)
Pt is not a pt here. I advised her the next New Patient appointment is not until May or June.  She states she was able to get a virtual visit with her old provider.  She will call back and schedule a new pt apt at a later day.    Thanks,   -Vernona Rieger

## 2021-07-28 NOTE — Telephone Encounter (Signed)
° °  Chief Complaint: Hives Symptoms: Hives to legs, arms Frequency: Started 2 weeks ago Pertinent Negatives: Patient denies any other symptoms Disposition: [] ED /[] Urgent Care (no appt availability in office) / [] Appointment(In office/virtual)/ []  [facility]/ [] Home Care/ [] Refused Recommended Disposition /[] [place] [facility]/ []  Follow-up with PCP Additional Notes: Pt. Asking to establish care and be seen for acute hives . Discussed [facility] virtual care. Please advise pt.    Answer Assessment - Initial Assessment Questions 1. APPEARANCE: "What does the rash look like?"      Hives, blisters 2. LOCATION: "Where is the rash located?"      Legs, arms 3. NUMBER: "How many hives are there?"      Several 4. SIZE: "How big are the hives?" (inches, cm, compare to coins) "Do they all look the same or is there lots of variation in shape and size?"      Large 5. ONSET: "When did the hives begin?" (Hours or days ago)      2 weeks ago 6. ITCHING: "Does it itch?" If Yes, ask: "How bad is the itch?"    - MILD: doesn't interfere with normal activities   - MODERATE-SEVERE: interferes with work, school, sleep, or other activities      Itchy 7. RECURRENT PROBLEM: "Have you had hives before?" If Yes, ask: "When was the last time?" and "What happened that time?"      Yes 8. TRIGGERS: "Were you exposed to any new food, plant, cosmetic product or animal just before the hives began?"     No 9. OTHER SYMPTOMS: "Do you have any other symptoms?" (e.g., fever, tongue swelling, difficulty breathing, abdominal pain)     No 10. PREGNANCY: "Is there any chance you are pregnant?" "When was your last menstrual period?"       Yes  Protocols used: Hives-A-AH

## 2021-08-06 DIAGNOSIS — R609 Edema, unspecified: Secondary | ICD-10-CM | POA: Diagnosis not present

## 2021-08-06 DIAGNOSIS — R5383 Other fatigue: Secondary | ICD-10-CM | POA: Diagnosis not present

## 2021-08-06 DIAGNOSIS — R140 Abdominal distension (gaseous): Secondary | ICD-10-CM | POA: Diagnosis not present

## 2021-08-06 DIAGNOSIS — R21 Rash and other nonspecific skin eruption: Secondary | ICD-10-CM | POA: Diagnosis not present

## 2021-08-06 DIAGNOSIS — R238 Other skin changes: Secondary | ICD-10-CM | POA: Diagnosis not present

## 2021-08-06 DIAGNOSIS — R490 Dysphonia: Secondary | ICD-10-CM | POA: Diagnosis not present

## 2021-09-30 DIAGNOSIS — M5412 Radiculopathy, cervical region: Secondary | ICD-10-CM | POA: Diagnosis not present

## 2021-09-30 DIAGNOSIS — M542 Cervicalgia: Secondary | ICD-10-CM | POA: Diagnosis not present

## 2021-10-17 ENCOUNTER — Other Ambulatory Visit: Payer: Self-pay

## 2021-10-17 ENCOUNTER — Emergency Department
Admission: EM | Admit: 2021-10-17 | Discharge: 2021-10-17 | Disposition: A | Discharge status: Home / Self Care | Payer: Medicare Other | Attending: Emergency Medicine | Admitting: Emergency Medicine

## 2021-10-17 ENCOUNTER — Emergency Department: Payer: Medicare Other

## 2021-10-17 DIAGNOSIS — M542 Cervicalgia: Secondary | ICD-10-CM | POA: Diagnosis not present

## 2021-10-17 DIAGNOSIS — S3992XA Unspecified injury of lower back, initial encounter: Secondary | ICD-10-CM | POA: Insufficient documentation

## 2021-10-17 DIAGNOSIS — S6991XA Unspecified injury of right wrist, hand and finger(s), initial encounter: Secondary | ICD-10-CM | POA: Insufficient documentation

## 2021-10-17 DIAGNOSIS — Y92410 Unspecified street and highway as the place of occurrence of the external cause: Secondary | ICD-10-CM | POA: Diagnosis not present

## 2021-10-17 DIAGNOSIS — M545 Low back pain, unspecified: Secondary | ICD-10-CM

## 2021-10-17 DIAGNOSIS — S161XXA Strain of muscle, fascia and tendon at neck level, initial encounter: Secondary | ICD-10-CM | POA: Insufficient documentation

## 2021-10-17 DIAGNOSIS — M79644 Pain in right finger(s): Secondary | ICD-10-CM

## 2021-10-17 DIAGNOSIS — I10 Essential (primary) hypertension: Secondary | ICD-10-CM | POA: Diagnosis not present

## 2021-10-17 DIAGNOSIS — S199XXA Unspecified injury of neck, initial encounter: Secondary | ICD-10-CM | POA: Diagnosis present

## 2021-10-17 MED ORDER — ETODOLAC 400 MG PO TABS: 400.0000 mg | ORAL_TABLET | Freq: Two times a day (BID) | ORAL | 0 refills | Status: DC
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
## 2021-10-17 NOTE — Discharge Instructions (Addendum)
Follow-up with your primary care provider if any continued problems or concerns.  Ice or heat to your muscles as needed for discomfort.  You can expect to be sore for approximately 4 to 5 days.  Continue taking your muscle relaxant that you have at home.  And anti-inflammatory pain medication was sent to your pharmacy to be taken twice a day with food. ?
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
## 2021-10-24 ENCOUNTER — Telehealth: Payer: Self-pay | Admitting: Family Medicine

## 2021-10-24 DIAGNOSIS — J301 Allergic rhinitis due to pollen: Secondary | ICD-10-CM | POA: Diagnosis not present

## 2021-10-24 DIAGNOSIS — R490 Dysphonia: Secondary | ICD-10-CM | POA: Diagnosis not present

## 2021-10-24 NOTE — Telephone Encounter (Signed)
Nikki w/ [facility] calling.   Dr Pryor Ochoa would like to ask Dr Raliegh Ip  dc pt's lisinopril. ?It is causing intermittent feelings like her throat is closing. ?I advised Lexine Baton that pt will establish care on 5/03, and the dr would not be able to do anything until after that time.  Nikki verbalized understanding. ?Cb [phone] ?

## 2021-10-29 ENCOUNTER — Ambulatory Visit (INDEPENDENT_AMBULATORY_CARE_PROVIDER_SITE_OTHER): Payer: Medicare Other | Admitting: Family Medicine

## 2021-10-29 ENCOUNTER — Encounter: Payer: Self-pay | Admitting: Family Medicine

## 2021-10-29 VITALS — BP 138/92 | HR 89 | Ht 68.0 in | Wt 358.5 lb

## 2021-10-29 DIAGNOSIS — F411 Generalized anxiety disorder: Secondary | ICD-10-CM

## 2021-10-29 DIAGNOSIS — Z1231 Encounter for screening mammogram for malignant neoplasm of breast: Secondary | ICD-10-CM | POA: Diagnosis not present

## 2021-10-29 DIAGNOSIS — G4733 Obstructive sleep apnea (adult) (pediatric): Secondary | ICD-10-CM | POA: Diagnosis not present

## 2021-10-29 DIAGNOSIS — R600 Localized edema: Secondary | ICD-10-CM

## 2021-10-29 DIAGNOSIS — E66813 Obesity, class 3: Secondary | ICD-10-CM

## 2021-10-29 DIAGNOSIS — F902 Attention-deficit hyperactivity disorder, combined type: Secondary | ICD-10-CM

## 2021-10-29 DIAGNOSIS — F428 Other obsessive-compulsive disorder: Secondary | ICD-10-CM

## 2021-10-29 DIAGNOSIS — I10 Essential (primary) hypertension: Secondary | ICD-10-CM

## 2021-10-29 DIAGNOSIS — Z6841 Body Mass Index (BMI) 40.0 and over, adult: Secondary | ICD-10-CM

## 2021-10-29 DIAGNOSIS — F314 Bipolar disorder, current episode depressed, severe, without psychotic features: Secondary | ICD-10-CM

## 2021-10-29 DIAGNOSIS — Z7689 Persons encountering health services in other specified circumstances: Secondary | ICD-10-CM

## 2021-10-29 MED ORDER — VALSARTAN 160 MG PO TABS: 160.0000 mg | ORAL_TABLET | Freq: Every day | ORAL | 3 refills | Status: DC

## 2021-10-29 NOTE — Patient Instructions (Addendum)
Thank you for coming to the office today. ? ?Stop Benazepril. ?Start Valsartan 160mg  daily ? ?For Mammogram screening for breast cancer  ? ?Call the Imaging Center below anytime to schedule your own appointment now that order has been placed. ? ?[facility] ?[facility] ?[address] ?[place], [place] ?Phone: [phone] ? ?Let me know if need help with the [facility] once you get started. ? ?Please schedule a Follow-up Appointment to: Return in about 7 months (around 05/31/2022) for 7 months Annual Physical in AM fasting labs AFTER. ? ?If you have any other questions or concerns, please feel free to call the office or send a message through MyChart. You may also schedule an earlier appointment if necessary. ? ?Additionally, you may be receiving a survey about your experience at our office within a few days to 1 week by e-mail or mail. We value your feedback. ? ?14/08/2021, DO ?[facility], [facility] ?

## 2021-10-29 NOTE — Progress Notes (Signed)
? ?Subjective:  ? ? Patient ID: Suzanne Wilcox, female    DOB: Mar 24, 1976, 46 y.o.   MRN: [id number] ? ?Suzanne Wilcox is a 46 y.o. female presenting on 10/29/2021 for Establish Care ? ?Previously w [facility] ? ?HPI ? ?CHRONIC HTN: ?Reports on Benazepril for a few years, decent results. Avg BP 130/80s. ?Previously on HCTZ ineffective ?Current Meds - Benazepril 20mg  daily   ?- Has PRN Furosemide 20mg  takes 1-2 PRN if need, infrequent usage, rarely 1-2 x monthly ?Reports good compliance, takes med in PM. 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 Taken off Lithium. ?Has had issue with lower extremity swelling onset after lithium toxicity, steroids to treat because thought it was autoimmune ?Thyroid panel was checked and normal ?Denies CP, dyspnea, HA, dizziness / lightheadedness ? ?Bipolar Type 1 ?OCD ?GAD ?ADHD ?Followed by Psychiatry - Dr [place] ?History of manic psychosis ?On Caplyta 42mg  daily ?On Xanax 0.5 QID PRN (2mg  tab) ?On Adderall 30mg  daily ?On Wellbutrin XL 450 daily (150 x 3 ) ?Previously on Seroquel/Quetiapine has come off regularly. Now has PRN only at night if needed if mania is bad, can take 100-200mg  PRN only ? ?Dr. 2021, MD ?Psychiatrist in [place],  ?Address: [address], [place], [place] ?Phone: [phone] ? ?Morbid Obesity BMI >54 ?Has been referred to [facility]. ?On intermittent fasting diet, and high protein low carb diet. ?Has had issue with losing weight after mother passed. ?Concerns with perimenopausal hormonal factor as well. ? ?Osteoarthritis multiple joints ?Chronic  pain ?History of spinal pinched nerves, radicular nerve pain into elbows and hands. Has had bilateral. Has had prednisone in past with relief. ?Followed by Orthopedics ?Has had injections in back/neck before. ?Taking Tizanidine  PRN ? ?OSA, on CPAP ?- Patient reports prior history of dx OSA and on CPAP for few years, prior to treatment initial symptoms were snoring, daytime sleepiness and fatigue, has had several sleep studies in the past. ?Currently not using oral appliance regularly. ?Admits sleeps sitting up now. Some difficulty still ?Trying to lose weight to help ?Cannot tolerate NSAID Ibuprofen 800mg  PRN, even with food. ? ? ? ?  10/29/2021  ? 10:35 AM 01/10/2014  ?  9:49 AM  ?Depression screen PHQ 2/9  ?Decreased Interest 1 0  ?Down, Depressed, Hopeless 1 0  ?PHQ - 2 Score 2 0  ?Altered sleeping 3   ?Tired, decreased energy 2   ?Change in appetite 3   ?Feeling bad or failure about yourself  2   ?Trouble concentrating 2   ?Moving slowly or fidgety/restless 2   ?Suicidal thoughts 0   ?PHQ-9 Score 16   ?Difficult doing work/chores Somewhat difficult   ? ? ?Past Medical History:  ?Diagnosis Date  ? Allergy   ? Anxiety   ? Bipolar disorder (HCC)   ? Bipolar disorder (HCC)   ? Depression   ? Generalized headaches   ? GERD (gastroesophageal reflux disease)   ? Hx of varicella   ? Hyperlipidemia   ? Hypertension   ? Missed abortions   ? X 6  ? Obesity 04/29/2012  ? Postoperative retention of urine 03/28/2016  ? Postpartum care following cesarean delivery (9/29) 03/27/2016  ? Sleep apnea   ? Termination of pregnancy (fetus) 03/22/2004  ? X 1  ? Vaginal Pap smear, abnormal   ? ?Past Surgical History:  ?  Procedure Laterality Date  ? CESAREAN SECTION  2011  ? X 1 [facility] [place]  ? CESAREAN SECTION N/A 05/22/2014  ? Procedure: Repeat CESAREAN SECTION;  Surgeon: Lenoard Aden, MD;  Location: [facility];  Service: Obstetrics;  Laterality: N/A;  EDD: 05/31/14  ? CESAREAN SECTION WITH BILATERAL TUBAL LIGATION Bilateral 03/27/2016  ? Procedure: Repeat CESAREAN SECTION WITH BILATERAL TUBAL LIGATION;  Surgeon: Olivia Mackie, MD;  Location: [facility];  Service: Obstetrics;  Laterality: Bilateral;  EDD: 04/01/16  ? CHOLECYSTECTOMY N/A 04/29/2017  ?  Procedure: LAPAROSCOPIC CHOLECYSTECTOMY WITH INTRAOPERATIVE CHOLANGIOGRAM;  Surgeon: Ovidio Kin, MD;  Location: [facility];  Service: General;  Laterality: N/A;  ERAS PATHWAY  ? DILATION AND CURETTAGE OF UTERUS   2005, 2010  ?   x2 MAB  ? ESOPHAGOGASTRODUODENOSCOPY N/A 01/11/2017  ? Procedure: ESOPHAGOGASTRODUODENOSCOPY (EGD);  Surgeon: Kerin Salen, MD;  Location: [facility];  Service: Gastroenterology;  Laterality: N/A;  ? IR GENERIC HISTORICAL  03/26/2016  ? IR FLUORO GUIDE CV LINE RIGHT 03/26/2016 [facility]  ? IR GENERIC HISTORICAL  03/26/2016  ? IR US GUIDE VASC ACCESS RIGHT 03/26/2016 [facility]  ? WISDOM TOOTH EXTRACTION    ? ?Social History  ? ?Socioeconomic History  ? Marital status: Single  ?  Spouse name: Not on file  ? Number of children: Not on file  ? Years of education: Not on file  ? Highest education level: Not on file  ?Occupational History  ? Not on file  ?Tobacco Use  ? Smoking status: Former  ?  Packs/day: 2.00  ?  Years: 18.00  ?  Pack years: 36.00  ?  Types: Cigarettes  ?  Quit date: 04/29/2009  ?  Years since quitting: 12.5  ? Smokeless tobacco: Never  ?Vaping Use  ? Vaping Use: Never used  ?Substance and Sexual Activity  ? Alcohol use: Not Currently  ?  Comment: OCCASION  ? Drug use: No  ? Sexual activity: Not on file  ?Other Topics Concern  ? Not on file  ?Social History Narrative  ? Right handed  ? One story home  ? 4 children  ? Drinks caffeine 1-2 cups a day  ? ?Social Determinants of Health  ? ?Financial Resource Strain: Not on file  ?Food Insecurity: Not on file  ?Transportation Needs: Not on file  ?Physical Activity: Not on file  ?Stress: Not on file  ?Social Connections: Not on file  ?Intimate Partner Violence: Not on file  ? ?Family History  ?Problem Relation Age of Onset  ? Depression Mother   ? Skin cancer Mother   ? Depression Paternal Grandmother   ? Pancreatic cancer Father   ? Kidney Stones Father   ? Stroke Father   ? Dementia Father   ? Skin cancer Sister   ? ?Current  Outpatient Medications on File Prior to Visit  ?Medication Sig  ? alprazolam (XANAX) 2 MG tablet Take 0.5 mg by mouth 4 (four) times daily as needed for anxiety.  ? amphetamine-dextroamphetamine (ADDERALL) 30 MG tablet Take 30 mg by mouth daily.  ? baclofen (LIORESAL) 10 MG tablet Take 0.5-1 tablets (5-10 mg total) by mouth 3 (three) times daily as needed for muscle spasms.  ? buPROPion (WELLBUTRIN XL) 150 MG 24 hr tablet Take 450 mg by mouth daily.   ? CAPLYTA 42 MG capsule Take 42 mg by mouth at bedtime.  ? Cholecalciferol (VITAMIN D3) 1.25 MG (50000 UT) CAPS   ? Coenzyme Q10 (CO Q 10) 100 MG  CAPS See admin instructions.  ? furosemide (LASIX) 20 MG tablet Take 20 mg by mouth daily.  ? Multiple Vitamin (MULTIVITAMIN) capsule Take 1 capsule by mouth daily.  ? norethindrone-ethinyl estradiol (LOESTRIN) 1-20 MG-MCG tablet 1 TABLET ORALLY ONCE A DAY FOR 4 PACKS, THEN SKIP A WEEK AND RESTART  ? omeprazole (PRILOSEC) 40 MG capsule Take 40 mg by mouth at bedtime.  ? ondansetron (ZOFRAN) 8 MG tablet Take 8 mg by mouth every 8 (eight) hours as needed for nausea or vomiting. Pt has not started medication yet  ? Probiotic TBEC See admin instructions.  ? QUEtiapine (SEROQUEL) 100 MG tablet Take 100-200 mg by mouth at bedtime as needed.  ? tiZANidine (ZANAFLEX) 4 MG tablet TAKE 1 TABLET (4 MG TOTAL) BY MOUTH EVERY 6 (SIX) HOURS AS NEEDED FOR MUSCLE SPASMS.  ? Turmeric 500 MG CAPS 500 mg.  ? aspirin-acetaminophen-caffeine (EXCEDRIN MIGRAINE) 250-250-65 MG tablet Take 2 tablets by mouth 2 (two) times daily as needed for headache. (Patient not taking: Reported on 10/29/2021)  ? ?No current facility-administered medications on file prior to visit.  ? ? ?Review of Systems ?Per HPI unless specifically indicated above ? ? ?   ?Objective:  ?  ?BP (!) 138/92   Pulse 89   Ht 5\' 8"  (1.727 m)   Wt (!) 358 lb 8 oz (162.6 kg)   SpO2 100%   BMI 54.51 kg/m?   ?Wt Readings from Last 3 Encounters:  ?10/29/21 (!) 358 lb 8 oz (162.6 kg)   ?12/16/20 (!) 350 lb (158.8 kg)  ?10/11/19 (!) 349 lb (158.3 kg)  ?  ?Physical Exam ?Vitals and nursing note reviewed.  ?Constitutional:   ?   General: She is not in acute distress. ?   Appearance: She is well-developed. She

## 2021-11-18 ENCOUNTER — Telehealth: Payer: Medicare Other | Admitting: Family Medicine

## 2021-11-26 DIAGNOSIS — J301 Allergic rhinitis due to pollen: Secondary | ICD-10-CM | POA: Diagnosis not present

## 2021-11-28 ENCOUNTER — Encounter: Payer: Self-pay | Admitting: Family Medicine

## 2021-11-28 ENCOUNTER — Ambulatory Visit (INDEPENDENT_AMBULATORY_CARE_PROVIDER_SITE_OTHER): Payer: Medicare Other | Admitting: Family Medicine

## 2021-11-28 VITALS — BP 134/88 | HR 103 | Ht 68.0 in | Wt 364.5 lb

## 2021-11-28 DIAGNOSIS — B379 Candidiasis, unspecified: Secondary | ICD-10-CM | POA: Diagnosis not present

## 2021-11-28 DIAGNOSIS — J0110 Acute frontal sinusitis, unspecified: Secondary | ICD-10-CM | POA: Diagnosis not present

## 2021-11-28 MED ORDER — PREDNISONE 20 MG PO TABS: ORAL_TABLET | ORAL | 0 refills | Status: DC

## 2021-11-28 MED ORDER — AMOXICILLIN-POT CLAVULANATE 875-125 MG PO TABS: 1.0000 | ORAL_TABLET | Freq: Two times a day (BID) | ORAL | 0 refills | Status: DC

## 2021-11-28 MED ORDER — FLUCONAZOLE 150 MG PO TABS: ORAL_TABLET | ORAL | 0 refills | Status: DC

## 2021-11-28 NOTE — Progress Notes (Signed)
Subjective:    Patient ID: Suzanne Wilcox, female    DOB: 03/18/1976, 46 y.o.   MRN: [id number]  KYMORAH KORF is a 46 y.o. female presenting on 11/28/2021 for URI  Patient presents for a same day appointment.  HPI  Sinusitis 1 week symptoms Dry cough now worsening productive Tried OTC medication Nasal spray Urgent care given Albuterol and Prednisone taper improved but not resolved Admits sinus pain pressure worse now Denies fever chills     11/28/2021    9:39 AM 10/29/2021   10:35 AM 01/10/2014    9:49 AM  Depression screen PHQ 2/9  Decreased Interest 2 1 0  Down, Depressed, Hopeless 1 1 0  PHQ - 2 Score 3 2 0  Altered sleeping 3 3   Tired, decreased energy 3 2   Change in appetite 3 3   Feeling bad or failure about yourself  2 2   Trouble concentrating 1 2   Moving slowly or fidgety/restless 0 2   Suicidal thoughts 0 0   PHQ-9 Score 15 16   Difficult doing work/chores Extremely dIfficult Somewhat difficult     Social History   Tobacco Use   Smoking status: Former    Packs/day: 1.00    Years: 18.00    Pack years: 18.00    Types: Cigarettes    Quit date: 04/29/2009    Years since quitting: 12.5   Smokeless tobacco: Never  Vaping Use   Vaping Use: Never used  Substance Use Topics   Alcohol use: Not Currently    Comment: OCCASION   Drug use: No    Review of Systems Per HPI unless specifically indicated above     Objective:    BP 134/88   Pulse (!) 103   Ht 5\' 8"  (1.727 m)   Wt (!) 364 lb 8 oz (165.3 kg)   SpO2 99%   BMI 55.42 kg/m   Wt Readings from Last 3 Encounters:  11/28/21 (!) 364 lb 8 oz (165.3 kg)  10/29/21 (!) 358 lb 8 oz (162.6 kg)  12/16/20 (!) 350 lb (158.8 kg)    Physical Exam Vitals and nursing note reviewed.  Constitutional:      General: She is not in acute distress.    Appearance: Normal appearance. She is well-developed. She is not diaphoretic.     Comments: Well-appearing, comfortable, cooperative  HENT:     Head:  Normocephalic and atraumatic.  Eyes:     General:        Right eye: No discharge.        Left eye: No discharge.     Conjunctiva/sclera: Conjunctivae normal.  Cardiovascular:     Rate and Rhythm: Normal rate.  Pulmonary:     Effort: Pulmonary effort is normal.  Skin:    General: Skin is warm and dry.     Findings: No erythema or rash.  Neurological:     Mental Status: She is alert and oriented to person, place, and time.  Psychiatric:        Mood and Affect: Mood normal.        Behavior: Behavior normal.        Thought Content: Thought content normal.     Comments: Well groomed, good eye contact, normal speech and thoughts   Results for orders placed or performed during the hospital encounter of 03/28/20  Novel Coronavirus, NAA ([facility])   Specimen: Nasopharyngeal Swab; Nasopharyngeal(NP) swabs in vial transport medium   Nasopharynge  Result Value  Ref Range   SARS-CoV-2, NAA Not Detected Not Detected  SARS-COV-2, NAA 2 DAY TAT   Nasopharynge  Result Value Ref Range   SARS-CoV-2, NAA 2 DAY TAT Performed       Assessment & Plan:   Problem List Items Addressed This Visit   None Visit Diagnoses     Acute non-recurrent frontal sinusitis    -  Primary   Relevant Medications   amoxicillin-clavulanate (AUGMENTIN) 875-125 MG tablet   predniSONE (DELTASONE) 20 MG tablet   fluconazole (DIFLUCAN) 150 MG tablet   Antibiotic-induced yeast infection       Relevant Medications   fluconazole (DIFLUCAN) 150 MG tablet       Consistent with acute frontal sinusitis, likely initially viral URI allergic rhinitis component with worsening concern for bacterial infection.   Plan: 1. Start Augmentin 875-125mg  PO BID x 10 days 2. Start nasal steroid Flonase 2 sprays in each nostril daily for 4-6 weeks, may repeat course seasonally or as needed 3. Diflucan PRN if need 4. Prednisone taper for bronchospasm Return criteria reviewed   Meds ordered this encounter  Medications    amoxicillin-clavulanate (AUGMENTIN) 875-125 MG tablet    Sig: Take 1 tablet by mouth 2 (two) times daily.    Dispense:  20 tablet    Refill:  0   predniSONE (DELTASONE) 20 MG tablet    Sig: Take daily with food. Start with 60mg  (3 pills) x 2 days, then reduce to 40mg  (2 pills) x 2 days, then 20mg  (1 pill) x 3 days    Dispense:  13 tablet    Refill:  0   fluconazole (DIFLUCAN) 150 MG tablet    Sig: Take one tablet by mouth on Day 1. Repeat dose 2nd tablet on Day 3.    Dispense:  2 tablet    Refill:  0      Follow up plan: Return if symptoms worsen or fail to improve.   , DO [facility] 11/28/2021, 9:53 AM

## 2021-11-28 NOTE — Patient Instructions (Addendum)
Thank you for coming to the office today.  Start Augmentin antibiotic  Keep on the nasal spray  Add Sudafed OTC decongestant if this helps.  If not improving 48 hours can add another taper of Prednisone.  Please schedule a Follow-up Appointment to: Return if symptoms worsen or fail to improve.  If you have any other questions or concerns, please feel free to call the office or send a message through MyChart. You may also schedule an earlier appointment if necessary.  Additionally, you may be receiving a survey about your experience at our office within a few days to 1 week by e-mail or mail. We value your feedback.  Saralyn Pilar, DO [facility], [place]

## 2021-12-02 DIAGNOSIS — J301 Allergic rhinitis due to pollen: Secondary | ICD-10-CM | POA: Diagnosis not present

## 2021-12-02 DIAGNOSIS — K219 Gastro-esophageal reflux disease without esophagitis: Secondary | ICD-10-CM | POA: Diagnosis not present

## 2021-12-02 DIAGNOSIS — R490 Dysphonia: Secondary | ICD-10-CM | POA: Diagnosis not present

## 2021-12-12 DIAGNOSIS — R490 Dysphonia: Secondary | ICD-10-CM | POA: Diagnosis not present

## 2021-12-15 ENCOUNTER — Encounter: Payer: Self-pay | Admitting: Family Medicine

## 2021-12-19 ENCOUNTER — Ambulatory Visit (INDEPENDENT_AMBULATORY_CARE_PROVIDER_SITE_OTHER): Payer: Medicare Other | Admitting: Family Medicine

## 2021-12-19 ENCOUNTER — Encounter: Payer: Self-pay | Admitting: Family Medicine

## 2021-12-19 VITALS — BP 124/81 | HR 91 | Ht 68.0 in | Wt 371.0 lb

## 2021-12-19 DIAGNOSIS — L03115 Cellulitis of right lower limb: Secondary | ICD-10-CM

## 2021-12-19 DIAGNOSIS — I83893 Varicose veins of bilateral lower extremities with other complications: Secondary | ICD-10-CM | POA: Diagnosis not present

## 2021-12-19 MED ORDER — MUPIROCIN 2 % EX OINT: 1.0000 | TOPICAL_OINTMENT | Freq: Two times a day (BID) | CUTANEOUS | 1 refills | Status: DC

## 2021-12-19 MED ORDER — FUROSEMIDE 40 MG PO TABS: 40.0000 mg | ORAL_TABLET | Freq: Every day | ORAL | 2 refills | Status: DC | PRN

## 2021-12-19 MED ORDER — AMOXICILLIN-POT CLAVULANATE 875-125 MG PO TABS: 1.0000 | ORAL_TABLET | Freq: Two times a day (BID) | ORAL | 0 refills | Status: DC

## 2021-12-23 DIAGNOSIS — Z87891 Personal history of nicotine dependence: Secondary | ICD-10-CM | POA: Diagnosis not present

## 2021-12-23 DIAGNOSIS — Z7689 Persons encountering health services in other specified circumstances: Secondary | ICD-10-CM | POA: Diagnosis not present

## 2021-12-23 DIAGNOSIS — Z713 Dietary counseling and surveillance: Secondary | ICD-10-CM | POA: Diagnosis not present

## 2021-12-23 DIAGNOSIS — K219 Gastro-esophageal reflux disease without esophagitis: Secondary | ICD-10-CM | POA: Diagnosis not present

## 2021-12-23 DIAGNOSIS — R29898 Other symptoms and signs involving the musculoskeletal system: Secondary | ICD-10-CM | POA: Diagnosis not present

## 2021-12-23 DIAGNOSIS — M6281 Muscle weakness (generalized): Secondary | ICD-10-CM | POA: Diagnosis not present

## 2022-01-02 DIAGNOSIS — Z7689 Persons encountering health services in other specified circumstances: Secondary | ICD-10-CM | POA: Diagnosis not present

## 2022-01-02 DIAGNOSIS — Z01818 Encounter for other preprocedural examination: Secondary | ICD-10-CM | POA: Diagnosis not present

## 2022-01-07 DIAGNOSIS — G5603 Carpal tunnel syndrome, bilateral upper limbs: Secondary | ICD-10-CM | POA: Diagnosis not present

## 2022-01-07 DIAGNOSIS — M542 Cervicalgia: Secondary | ICD-10-CM | POA: Diagnosis not present

## 2022-01-07 DIAGNOSIS — M5412 Radiculopathy, cervical region: Secondary | ICD-10-CM | POA: Diagnosis not present

## 2022-01-13 ENCOUNTER — Ambulatory Visit
Admission: RE | Admit: 2022-01-13 | Discharge: 2022-01-13 | Disposition: A | Discharge status: Home / Self Care | Payer: Medicare Other | Source: Ambulatory Visit | Attending: Family Medicine | Admitting: Family Medicine

## 2022-01-13 DIAGNOSIS — Z1231 Encounter for screening mammogram for malignant neoplasm of breast: Secondary | ICD-10-CM | POA: Diagnosis not present

## 2022-01-15 ENCOUNTER — Inpatient Hospital Stay
Admission: RE | Admit: 2022-01-15 | Discharge: 2022-01-15 | Disposition: A | Discharge status: Home / Self Care | Payer: Self-pay | Source: Ambulatory Visit | Attending: *Deleted | Admitting: *Deleted

## 2022-01-15 ENCOUNTER — Other Ambulatory Visit: Payer: Self-pay | Admitting: *Deleted

## 2022-01-15 DIAGNOSIS — Z1231 Encounter for screening mammogram for malignant neoplasm of breast: Secondary | ICD-10-CM

## 2022-01-19 ENCOUNTER — Ambulatory Visit (INDEPENDENT_AMBULATORY_CARE_PROVIDER_SITE_OTHER): Payer: Medicare Other | Admitting: Family Medicine

## 2022-01-19 ENCOUNTER — Encounter: Payer: Self-pay | Admitting: Family Medicine

## 2022-01-19 VITALS — BP 150/95 | HR 113 | Ht 68.0 in | Wt 378.0 lb

## 2022-01-19 DIAGNOSIS — B379 Candidiasis, unspecified: Secondary | ICD-10-CM | POA: Diagnosis not present

## 2022-01-19 DIAGNOSIS — N3001 Acute cystitis with hematuria: Secondary | ICD-10-CM

## 2022-01-19 DIAGNOSIS — N946 Dysmenorrhea, unspecified: Secondary | ICD-10-CM | POA: Diagnosis not present

## 2022-01-19 DIAGNOSIS — R319 Hematuria, unspecified: Secondary | ICD-10-CM | POA: Diagnosis not present

## 2022-01-19 DIAGNOSIS — Z3041 Encounter for surveillance of contraceptive pills: Secondary | ICD-10-CM

## 2022-01-19 DIAGNOSIS — N921 Excessive and frequent menstruation with irregular cycle: Secondary | ICD-10-CM

## 2022-01-19 LAB — POCT URINALYSIS DIPSTICK
Bilirubin, UA: NEGATIVE
Glucose, UA: NEGATIVE
Ketones, UA: NEGATIVE
Nitrite, UA: NEGATIVE
Protein, UA: NEGATIVE
Spec Grav, UA: 1.02 (ref 1.010–1.025)
Urobilinogen, UA: 0.2 E.U./dL
pH, UA: 5 (ref 5.0–8.0)

## 2022-01-19 MED ORDER — FLUCONAZOLE 150 MG PO TABS: ORAL_TABLET | ORAL | 1 refills | Status: DC

## 2022-01-19 MED ORDER — CEPHALEXIN 500 MG PO CAPS: 500.0000 mg | ORAL_CAPSULE | Freq: Three times a day (TID) | ORAL | 0 refills | Status: DC

## 2022-01-19 MED ORDER — NORETHINDRONE ACET-ETHINYL EST 1.5-30 MG-MCG PO TABS: 1.0000 | ORAL_TABLET | Freq: Every day | ORAL | 3 refills | Status: DC

## 2022-01-19 NOTE — Progress Notes (Signed)
Subjective:    Patient ID: Suzanne Wilcox, female    DOB: 1976-01-02, 46 y.o.   MRN: [id number]  Suzanne Wilcox is a 46 y.o. female presenting on 01/19/2022 for Hematuria and painful urination   HPI  Breakthrough Bleeding / Menstrual UTI She had urinary symptoms with urinary concerns with urinary discomfort pain. She noticed blood in urine but now has identified that it is due to breakthrough bleeding with OCP and menstrual cramping now. Not actually having back or flank pain. She takes Loestrin 1-20 mcg dose usually takes 21 hormone pills x 4 packs without placebo therapy No missed period. Denies fever chills nausea vomiting       12/19/2021   10:22 AM 11/28/2021    9:39 AM 10/29/2021   10:35 AM  Depression screen PHQ 2/9  Decreased Interest 2 2 1   Down, Depressed, Hopeless 1 1 1   PHQ - 2 Score 3 3 2   Altered sleeping 2 3 3   Tired, decreased energy 3 3 2   Change in appetite 3 3 3   Feeling bad or failure about yourself  2 2 2   Trouble concentrating 2 1 2   Moving slowly or fidgety/restless 0 0 2  Suicidal thoughts 0 0 0  PHQ-9 Score 15 15 16   Difficult doing work/chores Very difficult Extremely dIfficult Somewhat difficult    Social History   Tobacco Use  . Smoking status: Former    Packs/day: 1.00    Years: 18.00    Total pack years: 18.00    Types: Cigarettes    Quit date: 04/29/2009    Years since quitting: 12.7  . Smokeless tobacco: Never  Vaping Use  . Vaping Use: Never used  Substance Use Topics  . Alcohol use: Not Currently    Comment: OCCASION  . Drug use: No    Review of Systems Per HPI unless specifically indicated above     Objective:    BP (!) 150/95   Pulse (!) 113   Ht 5\' 8"  (1.727 m)   Wt (!) 378 lb (171.5 kg)   SpO2 96%   BMI 57.47 kg/m   Wt Readings from Last 3 Encounters:  01/19/22 (!) 378 lb (171.5 kg)  12/19/21 (!) 371 lb (168.3 kg)  11/28/21 (!) 364 lb 8 oz (165.3 kg)    Physical Exam Vitals and nursing note reviewed.   Constitutional:      General: She is not in acute distress.    Appearance: Normal appearance. She is well-developed. She is not diaphoretic.     Comments: Well-appearing, comfortable, cooperative  HENT:     Head: Normocephalic and atraumatic.  Eyes:     General:        Right eye: No discharge.        Left eye: No discharge.     Conjunctiva/sclera: Conjunctivae normal.  Cardiovascular:     Rate and Rhythm: Normal rate.  Pulmonary:     Effort: Pulmonary effort is normal.  Skin:    General: Skin is warm and dry.     Findings: No erythema or rash.  Neurological:     Mental Status: She is alert and oriented to person, place, and time.  Psychiatric:        Mood and Affect: Mood normal.        Behavior: Behavior normal.        Thought Content: Thought content normal.     Comments: Well groomed, good eye contact, normal speech and thoughts   Results for  orders placed or performed in visit on 01/19/22  POCT Urinalysis Dipstick  Result Value Ref Range   Color, UA Orange    Clarity, UA Cloudy    Glucose, UA Negative Negative   Bilirubin, UA Negative    Ketones, UA Negative    Spec Grav, UA 1.020 1.010 - 1.025   Blood, UA Moderate ++    pH, UA 5.0 5.0 - 8.0   Protein, UA Negative Negative   Urobilinogen, UA 0.2 0.2 or 1.0 E.U./dL   Nitrite, UA Negative    Leukocytes, UA Trace (A) Negative   Appearance     Odor        Assessment & Plan:   Problem List Items Addressed This Visit   None Visit Diagnoses     Acute cystitis with hematuria    -  Primary   Relevant Medications   cephALEXin (KEFLEX) 500 MG capsule   Hematuria, unspecified type       Relevant Orders   POCT Urinalysis Dipstick (Completed)   Urine Culture   Dysmenorrhea       Breakthrough bleeding on OCPs       Antibiotic-induced yeast infection       Relevant Medications   fluconazole (DIFLUCAN) 150 MG tablet   cephALEXin (KEFLEX) 500 MG capsule   Encounter for surveillance of contraceptive pills        Relevant Medications   Norethindrone Acetate-Ethinyl Estradiol (LOESTRIN) 1.5-30 MG-MCG tablet       Breakthrough bleeding on OCP Change dose to increased Loestrin dosage from 1-20 to 1.5-30mcg dosing 90 day course without placebo, x 4 of the 28 pill count packs, skip placebo If ultimately unsuccessful we can refer to GYN  UTI., possible UA dipstick suggestive Check Urine Culture Start Keflex for antibiotic Add Diflucan if / when you develop any yeast symptoms.   Meds ordered this encounter  Medications  . fluconazole (DIFLUCAN) 150 MG tablet    Sig: Take one tablet by mouth on Day 1. Repeat dose 2nd tablet on Day 3.    Dispense:  2 tablet    Refill:  1  . cephALEXin (KEFLEX) 500 MG capsule    Sig: Take 1 capsule (500 mg total) by mouth 3 (three) times daily. For 7 days    Dispense:  21 capsule    Refill:  0  . Norethindrone Acetate-Ethinyl Estradiol (LOESTRIN) 1.5-30 MG-MCG tablet    Sig: Take 1 tablet by mouth daily. Skip the placebo row of pills (7) during treatment course to complete 3 months without placebo.    Dispense:  112 tablet    Refill:  3    4 packs, skipping the placebo doses      Follow up plan: Return if symptoms worsen or fail to improve.   Saralyn Pilar, DO [facility] 01/19/2022, 2:09 PM

## 2022-01-19 NOTE — Patient Instructions (Addendum)
Thank you for coming to the office today.  New order higher dose Loestrin OCP  May start right away.  If ultimately unsuccessful we can refer to GYN  Start Keflex for antibiotic Add Diflucan if / when you develop any yeast symptoms.  Urine culture pending.  Please schedule a Follow-up Appointment to: Return if symptoms worsen or fail to improve.  If you have any other questions or concerns, please feel free to call the office or send a message through MyChart. You may also schedule an earlier appointment if necessary.  Additionally, you may be receiving a survey about your experience at our office within a few days to 1 week by e-mail or mail. We value your feedback.  Saralyn Pilar, DO [facility], [place]

## 2022-01-20 LAB — URINE CULTURE
MICRO NUMBER:: 13686216
Result:: NO GROWTH
SPECIMEN QUALITY:: ADEQUATE

## 2022-01-21 DIAGNOSIS — M545 Low back pain, unspecified: Secondary | ICD-10-CM | POA: Diagnosis not present

## 2022-02-05 ENCOUNTER — Other Ambulatory Visit (INDEPENDENT_AMBULATORY_CARE_PROVIDER_SITE_OTHER): Payer: Self-pay | Admitting: Urology

## 2022-02-05 ENCOUNTER — Other Ambulatory Visit (INDEPENDENT_AMBULATORY_CARE_PROVIDER_SITE_OTHER): Payer: Self-pay | Admitting: Nurse Practitioner

## 2022-02-05 DIAGNOSIS — I83009 Varicose veins of unspecified lower extremity with ulcer of unspecified site: Secondary | ICD-10-CM

## 2022-02-05 DIAGNOSIS — R609 Edema, unspecified: Secondary | ICD-10-CM

## 2022-02-06 ENCOUNTER — Ambulatory Visit (INDEPENDENT_AMBULATORY_CARE_PROVIDER_SITE_OTHER): Payer: Medicare Other

## 2022-02-06 ENCOUNTER — Telehealth: Payer: Self-pay

## 2022-02-06 ENCOUNTER — Ambulatory Visit (INDEPENDENT_AMBULATORY_CARE_PROVIDER_SITE_OTHER): Payer: Medicare Other | Admitting: Nurse Practitioner

## 2022-02-06 ENCOUNTER — Encounter (INDEPENDENT_AMBULATORY_CARE_PROVIDER_SITE_OTHER): Payer: Self-pay | Admitting: Nurse Practitioner

## 2022-02-06 VITALS — BP 179/97 | HR 111 | Resp 18 | Ht 67.0 in | Wt 378.0 lb

## 2022-02-06 DIAGNOSIS — I10 Essential (primary) hypertension: Secondary | ICD-10-CM | POA: Diagnosis not present

## 2022-02-06 DIAGNOSIS — R609 Edema, unspecified: Secondary | ICD-10-CM

## 2022-02-06 DIAGNOSIS — I8311 Varicose veins of right lower extremity with inflammation: Secondary | ICD-10-CM

## 2022-02-06 DIAGNOSIS — I890 Lymphedema, not elsewhere classified: Secondary | ICD-10-CM | POA: Diagnosis not present

## 2022-02-06 DIAGNOSIS — Z6841 Body Mass Index (BMI) 40.0 and over, adult: Secondary | ICD-10-CM

## 2022-02-06 DIAGNOSIS — M47816 Spondylosis without myelopathy or radiculopathy, lumbar region: Secondary | ICD-10-CM

## 2022-02-06 DIAGNOSIS — I8312 Varicose veins of left lower extremity with inflammation: Secondary | ICD-10-CM | POA: Diagnosis not present

## 2022-02-06 DIAGNOSIS — M5020 Other cervical disc displacement, unspecified cervical region: Secondary | ICD-10-CM

## 2022-02-06 DIAGNOSIS — I83009 Varicose veins of unspecified lower extremity with ulcer of unspecified site: Secondary | ICD-10-CM

## 2022-02-06 NOTE — Telephone Encounter (Signed)
Copied from CRM [id number]. Topic: Referral - Status >> Feb 06, 2022 11:48 AM Tiffany B wrote: Reason for CRM: Patient would like referreal sent to [facility] C. Chasnis, DO physiatry [address] [place], [place], [place] [address] [phone] and not [facility]. Caller would like a follw up when completed

## 2022-02-06 NOTE — Telephone Encounter (Signed)
Please notify patient  Referral has been entered for [facility] - Dr Merri Ray.

## 2022-02-07 ENCOUNTER — Encounter (INDEPENDENT_AMBULATORY_CARE_PROVIDER_SITE_OTHER): Payer: Self-pay | Admitting: Nurse Practitioner

## 2022-02-07 NOTE — Progress Notes (Addendum)
Subjective:    Patient ID: Suzanne Wilcox, female    DOB: 11/10/1975, 46 y.o.   MRN: [id number] Chief Complaint  Patient presents with   Establish Care    Referred by Dr Althea Charon    The patient is a 46 year old female who presents today as a referral from Dr. Althea Charon in regards to lower extremity edema with ulceration and weeping.  The patient notes that the swelling initially began approximately 3 years ago when she had a lithium toxicity.  Since that time it is only worsened.  She notes redness from the ankle to the knee area.  She notes that when she stands it hurts her lower leg.  She has had several instances of significant redness and discoloration which has been treated with steroids.  The patient currently utilizes medical grade compression stockings although she has some difficulty because they are itchy for her.  There are currently no open wounds or ulcerations.  Patient elevates her lower extremities as well and this is also been helpful for her.  The patient underwent studies today which shows no evidence of DVT or superficial thrombophlebitis bilaterally.  No evidence of deep venous insufficiency bilaterally.  There is a small amount of reflux in the left great saphenous vein at the proximal thigh.    Review of Systems  Cardiovascular:  Positive for leg swelling.  All other systems reviewed and are negative.      Objective:   Physical Exam Vitals reviewed.  HENT:     Head: Normocephalic.  Cardiovascular:     Rate and Rhythm: Normal rate.     Pulses: Normal pulses.  Pulmonary:     Effort: Pulmonary effort is normal.  Musculoskeletal:     Right lower leg: Edema present.     Left lower leg: Edema present.  Skin:    General: Skin is warm and dry.     Capillary Refill: Capillary refill takes less than 2 seconds.     Comments: Dermal thickening bilaterally with hyperpigmentation  Neurological:     Mental Status: She is alert and oriented to person, place, and  time.  Psychiatric:        Mood and Affect: Mood normal.        Behavior: Behavior normal.        Thought Content: Thought content normal.        Judgment: Judgment normal.     BP (!) 179/97 (BP Location: Left Arm)   Pulse (!) 111   Resp 18   Ht 5\' 7"  (1.702 m)   Wt (!) 378 lb (171.5 kg)   BMI 59.20 kg/m   Past Medical History:  Diagnosis Date   Allergy    Anxiety    Bipolar disorder (HCC)    Bipolar disorder (HCC)    Depression    Generalized headaches    GERD (gastroesophageal reflux disease)    Hx of varicella    Hyperlipidemia    Hypertension    Missed abortions    X 6   Obesity 04/29/2012   Postoperative retention of urine 03/28/2016   Postpartum care following cesarean delivery (9/29) 03/27/2016   Sleep apnea    Termination of pregnancy (fetus) 03/22/2004   X 1   Vaginal Pap smear, abnormal     Social History   Socioeconomic History   Marital status: Single    Spouse name: Not on file   Number of children: Not on file   Years of education: Not on file  Highest education level: Not on file  Occupational History   Not on file  Tobacco Use   Smoking status: Former    Packs/day: 1.00    Years: 18.00    Total pack years: 18.00    Types: Cigarettes    Quit date: 04/29/2009    Years since quitting: 12.7   Smokeless tobacco: Never  Vaping Use   Vaping Use: Never used  Substance and Sexual Activity   Alcohol use: Not Currently    Comment: OCCASION   Drug use: No   Sexual activity: Not on file  Other Topics Concern   Not on file  Social History Narrative   Right handed   One story home   4 children   Drinks caffeine 1-2 cups a day   Social Determinants of Health   Financial Resource Strain: Not on file  Food Insecurity: Not on file  Transportation Needs: Not on file  Physical Activity: Not on file  Stress: Not on file  Social Connections: Not on file  Intimate Partner Violence: Not on file    Past Surgical History:  Procedure  Laterality Date   CESAREAN SECTION  2011   X 1 [place] N/A 05/22/2014   Procedure: Repeat CESAREAN SECTION;  Surgeon: Lovenia Kim, MD;  Location: [facility];  Service: Obstetrics;  Laterality: N/A;  EDD: 05/31/14   CESAREAN SECTION WITH BILATERAL TUBAL LIGATION Bilateral 03/27/2016   Procedure: Repeat CESAREAN SECTION WITH BILATERAL TUBAL LIGATION;  Surgeon: Brien Few, MD;  Location: [place];  Service: Obstetrics;  Laterality: Bilateral;  EDD: 04/01/16   CHOLECYSTECTOMY N/A 04/29/2017   Procedure: LAPAROSCOPIC CHOLECYSTECTOMY WITH INTRAOPERATIVE CHOLANGIOGRAM;  Surgeon: Alphonsa Overall, MD;  Location: [facility];  Service: General;  Laterality: N/A;  ERAS PATHWAY   DILATION AND CURETTAGE OF UTERUS   2005, 2010     x2 MAB   ESOPHAGOGASTRODUODENOSCOPY N/A 01/11/2017   Procedure: ESOPHAGOGASTRODUODENOSCOPY (EGD);  Surgeon: Ronnette Juniper, MD;  Location: [facility];  Service: Gastroenterology;  Laterality: N/A;   IR GENERIC HISTORICAL  03/26/2016   IR FLUORO GUIDE CV LINE RIGHT 03/26/2016 [facility]   IR GENERIC HISTORICAL  03/26/2016   IR US GUIDE VASC ACCESS RIGHT 03/26/2016 [facility]   WISDOM TOOTH EXTRACTION      Family History  Problem Relation Age of Onset   Depression Mother    Skin cancer Mother    Pancreatic cancer Father    Kidney Stones Father    Stroke Father    Dementia Father    Skin cancer Sister    Depression Paternal Grandmother    Breast cancer Neg Hx     Allergies  Allergen Reactions   Adhesive [Tape] Rash   Lyrica [Pregabalin] Photosensitivity       Latest Ref Rng & Units 04/28/2017    8:20 AM 03/28/2016    5:20 AM 03/27/2016   10:50 AM  CBC  WBC 4.0 - 10.5 K/uL 6.3  14.3  10.9   Hemoglobin 12.0 - 15.0 g/dL 13.4  10.1  12.2   Hematocrit 36.0 - 46.0 % 40.5  29.6  35.8   Platelets 150 - 400 K/uL 310  218  238       CMP     Component Value Date/Time   NA 138 04/28/2017 0820   K 4.2 04/28/2017 0820   CL 104 04/28/2017  0820   CO2 26 04/28/2017 0820   GLUCOSE 95 04/28/2017 0820   BUN 19 04/28/2017  0820   CREATININE 0.88 04/28/2017 0820   CALCIUM 9.0 04/28/2017 0820   PROT 7.1 04/28/2017 0820   ALBUMIN 3.9 04/28/2017 0820   AST 16 04/28/2017 0820   ALT 16 04/28/2017 0820   ALKPHOS 72 04/28/2017 0820   BILITOT 0.6 04/28/2017 0820   GFRNONAA >60 04/28/2017 0820   GFRAA >60 04/28/2017 0820     No results found.     Assessment & Plan:   1. Lymphedema Recommend:  No surgery or intervention at this point in time.    I have reviewed my discussion with the patient regarding lymphedema and why it  causes symptoms.  Patient will continue wearing graduated compression on a daily basis. The patient should put the compression on first thing in the morning and removing them in the evening. The patient should not sleep in the compression.   In addition, behavioral modification throughout the day will be continued.  This will include frequent elevation (such as in a recliner), use of over the counter pain medications as needed and exercise such as walking.  The systemic causes for chronic edema such as liver, kidney and cardiac etiologies do not appear to have significant changed over the past year.    Despite conservative treatments, of at least 4 weeks, including graduated compression therapy class 1 and behavioral modification including exercise and elevation the patient  has not obtained adequate control of the lymphedema.  The patient still has stage 3 lymphedema and therefore, I believe that a lymph pump should be added to improve the control of the patient's lymphedema.  Additionally, a lymph pump is warranted because it will reduce the risk of cellulitis and ulceration in the future.  Patient should follow-up in six months    2. Class 3 severe obesity due to excess calories without serious comorbidity with body mass index (BMI) of 50.0 to 59.9 in adult [facility]) This is also likely contributing to the  patient's lower extremity edema.  She notes that she has an upcoming bypass surgery.  This will likely greatly improve the patient's edema.  3. Essential hypertension Continue antihypertensive medications as already ordered, these medications have been reviewed and there are no changes at this time.   4. Varicose veins of both lower extremities with inflammation The patient's symptoms are in her bilateral lower extremity.  Given the one area of reflux it is unlikely that an endovenous laser ablation would give her significant relief from symptoms.  We will continue to follow in addition to having the patient continue with her conservative measures as noted above. - VAS [place] LOWER EXTREMITY VENOUS REFLUX    Current Outpatient Medications on File Prior to Visit  Medication Sig Dispense Refill   alprazolam (XANAX) 2 MG tablet Take 0.5 mg by mouth 4 (four) times daily as needed for anxiety.     amphetamine-dextroamphetamine (ADDERALL) 30 MG tablet Take 30 mg by mouth daily.     aspirin-acetaminophen-caffeine (EXCEDRIN MIGRAINE) 250-250-65 MG tablet Take 2 tablets by mouth 2 (two) times daily as needed for headache.     baclofen (LIORESAL) 10 MG tablet Take 0.5-1 tablets (5-10 mg total) by mouth 3 (three) times daily as needed for muscle spasms. 30 each 3   buPROPion (WELLBUTRIN XL) 150 MG 24 hr tablet Take 450 mg by mouth daily.      CAPLYTA 42 MG capsule Take 42 mg by mouth at bedtime.     Cholecalciferol (VITAMIN D3) 1.25 MG (50000 UT) CAPS      Coenzyme  Q10 (CO Q 10) 100 MG CAPS See admin instructions.     fluconazole (DIFLUCAN) 150 MG tablet Take one tablet by mouth on Day 1. Repeat dose 2nd tablet on Day 3. 2 tablet 1   furosemide (LASIX) 40 MG tablet Take 1 tablet (40 mg total) by mouth daily as needed for edema or fluid. 30 tablet 2   Multiple Vitamin (MULTIVITAMIN) capsule Take 1 capsule by mouth daily.     mupirocin ointment (BACTROBAN) 2 % Apply 1 Application topically 2 (two) times  daily. For 7-10 days as needed 22 g 1   Norethindrone Acetate-Ethinyl Estradiol (LOESTRIN) 1.5-30 MG-MCG tablet Take 1 tablet by mouth daily. Skip the placebo row of pills (7) during treatment course to complete 3 months without placebo. 112 tablet 3   omeprazole (PRILOSEC) 40 MG capsule Take 40 mg by mouth at bedtime.     ondansetron (ZOFRAN) 8 MG tablet Take 8 mg by mouth every 8 (eight) hours as needed for nausea or vomiting. Pt has not started medication yet     Probiotic TBEC See admin instructions.     QUEtiapine (SEROQUEL) 100 MG tablet Take 100-200 mg by mouth at bedtime as needed.     tiZANidine (ZANAFLEX) 4 MG tablet TAKE 1 TABLET (4 MG TOTAL) BY MOUTH EVERY 6 (SIX) HOURS AS NEEDED FOR MUSCLE SPASMS. 60 tablet 3   Turmeric 500 MG CAPS 500 mg.     valsartan (DIOVAN) 160 MG tablet Take 1 tablet (160 mg total) by mouth at bedtime. 90 tablet 3   No current facility-administered medications on file prior to visit.    There are no Patient Instructions on file for this visit. No follow-ups on file.   Kris Hartmann, NP

## 2022-02-15 ENCOUNTER — Telehealth: Payer: Medicare Other | Admitting: Family Medicine

## 2022-02-15 DIAGNOSIS — M5432 Sciatica, left side: Secondary | ICD-10-CM

## 2022-02-15 DIAGNOSIS — M542 Cervicalgia: Secondary | ICD-10-CM | POA: Diagnosis not present

## 2022-02-15 MED ORDER — PREDNISONE 10 MG PO TABS: 10.0000 mg | ORAL_TABLET | Freq: Every day | ORAL | 0 refills | Status: AC

## 2022-02-15 NOTE — Progress Notes (Signed)
Virtual Visit Consent   Suzanne Wilcox, you are scheduled for a virtual visit with a [name] provider today. Just as with appointments in the office, your consent must be obtained to participate. Your consent will be active for this visit and any virtual visit you may have with one of our providers in the next 365 days. If you have a MyChart account, a copy of this consent can be sent to you electronically.  As this is a virtual visit, video technology does not allow for your provider to perform a traditional examination. This may limit your provider's ability to fully assess your condition. If your provider identifies any concerns that need to be evaluated in person or the need to arrange testing (such as labs, EKG, etc.), we will make arrangements to do so. Although advances in technology are sophisticated, we cannot ensure that it will always work on either your end or our end. If the connection with a video visit is poor, the visit may have to be switched to a telephone visit. With either a video or telephone visit, we are not always able to ensure that we have a secure connection.  By engaging in this virtual visit, you consent to the provision of healthcare and authorize for your insurance to be billed (if applicable) for the services provided during this visit. Depending on your insurance coverage, you may receive a charge related to this service.  I need to obtain your verbal consent now. Are you willing to proceed with your visit today? Suzanne Wilcox has provided verbal consent on 02/15/2022 for a virtual visit (video or telephone). Georgana Curio, FNP  Date: 02/15/2022 10:28 AM  Virtual Visit via Video Note   I, Georgana Curio, connected with  Suzanne Wilcox  ([id number], 09-07-1975) on 02/15/22 at 10:30 AM EDT by a video-enabled telemedicine application and verified that I am speaking with the correct person using two identifiers.  Location: Patient: Virtual Visit Location Patient:  Home Provider: Virtual Visit Location Provider: Home Office   I discussed the limitations of evaluation and management by telemedicine and the availability of in person appointments. The patient expressed understanding and agreed to proceed.    History of Present Illness: Suzanne Wilcox is a 46 y.o. who identifies as a female who was assigned female at birth, and is being seen today for neck pain bilat and left sciatica. She says she has flares of her neck pain occasionally and a 12 day dose pack helps. She also says then a few days later her left sciatica flared. No fever. Rom intact. In no distress. She has muscle relaxants to use and home and will make apptmt with her pcp. [name]  HPI: HPI  Problems:  Patient Active Problem List   Diagnosis Date Noted   Essential hypertension 10/29/2021   Bilateral lower extremity edema 10/29/2021   Class 3 severe obesity due to excess calories without serious comorbidity with body mass index (BMI) of 50.0 to 59.9 in adult [facility]) 10/29/2021   OSA (obstructive sleep apnea) 10/29/2021   Attention deficit hyperactivity disorder (ADHD), combined type 10/29/2021   GAD (generalized anxiety disorder) 10/29/2021   Excessive daytime sleepiness 12/16/2020   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   Herniated cervical intervertebral disc 06/28/2017   Fatty (change of) liver, not elsewhere classified 12/27/2016   Postoperative retention of urine 03/28/2016   Gallbladder disease 04/30/2015   Bipolar I disorder, most recent episode (or current) depressed, severe, without mention of psychotic behavior 05/11/2012  OCD (obsessive compulsive disorder) 05/11/2012   Substance abuse (HCC) 05/11/2012    Allergies:  Allergies  Allergen Reactions   Adhesive [Tape] Rash   Lyrica [Pregabalin] Photosensitivity   Medications:  Current Outpatient Medications:    predniSONE (DELTASONE) 10 MG tablet, Take 1 tablet (10 mg total) by mouth daily with breakfast for 12 days. Prednisone  10 mg- 12 day dose pack as directed., Disp: 1 tablet, Rfl: 0   alprazolam (XANAX) 2 MG tablet, Take 0.5 mg by mouth 4 (four) times daily as needed for anxiety., Disp: , Rfl:    amphetamine-dextroamphetamine (ADDERALL) 30 MG tablet, Take 30 mg by mouth daily., Disp: , Rfl:    aspirin-acetaminophen-caffeine (EXCEDRIN MIGRAINE) 250-250-65 MG tablet, Take 2 tablets by mouth 2 (two) times daily as needed for headache., Disp: , Rfl:    baclofen (LIORESAL) 10 MG tablet, Take 0.5-1 tablets (5-10 mg total) by mouth 3 (three) times daily as needed for muscle spasms., Disp: 30 each, Rfl: 3   buPROPion (WELLBUTRIN XL) 150 MG 24 hr tablet, Take 450 mg by mouth daily. , Disp: , Rfl:    CAPLYTA 42 MG capsule, Take 42 mg by mouth at bedtime., Disp: , Rfl:    Cholecalciferol (VITAMIN D3) 1.25 MG (50000 UT) CAPS, , Disp: , Rfl:    Coenzyme Q10 (CO Q 10) 100 MG CAPS, See admin instructions., Disp: , Rfl:    fluconazole (DIFLUCAN) 150 MG tablet, Take one tablet by mouth on Day 1. Repeat dose 2nd tablet on Day 3., Disp: 2 tablet, Rfl: 1   furosemide (LASIX) 40 MG tablet, Take 1 tablet (40 mg total) by mouth daily as needed for edema or fluid., Disp: 30 tablet, Rfl: 2   Multiple Vitamin (MULTIVITAMIN) capsule, Take 1 capsule by mouth daily., Disp: , Rfl:    mupirocin ointment (BACTROBAN) 2 %, Apply 1 Application topically 2 (two) times daily. For 7-10 days as needed, Disp: 22 g, Rfl: 1   Norethindrone Acetate-Ethinyl Estradiol (LOESTRIN) 1.5-30 MG-MCG tablet, Take 1 tablet by mouth daily. Skip the placebo row of pills (7) during treatment course to complete 3 months without placebo., Disp: 112 tablet, Rfl: 3   omeprazole (PRILOSEC) 40 MG capsule, Take 40 mg by mouth at bedtime., Disp: , Rfl:    ondansetron (ZOFRAN) 8 MG tablet, Take 8 mg by mouth every 8 (eight) hours as needed for nausea or vomiting. Pt has not started medication yet, Disp: , Rfl:    Probiotic TBEC, See admin instructions., Disp: , Rfl:    QUEtiapine  (SEROQUEL) 100 MG tablet, Take 100-200 mg by mouth at bedtime as needed., Disp: , Rfl:    tiZANidine (ZANAFLEX) 4 MG tablet, TAKE 1 TABLET (4 MG TOTAL) BY MOUTH EVERY 6 (SIX) HOURS AS NEEDED FOR MUSCLE SPASMS., Disp: 60 tablet, Rfl: 3   Turmeric 500 MG CAPS, 500 mg., Disp: , Rfl:    valsartan (DIOVAN) 160 MG tablet, Take 1 tablet (160 mg total) by mouth at bedtime., Disp: 90 tablet, Rfl: 3  Observations/Objective: Patient is well-developed, well-nourished in no acute distress.  Resting comfortably  at home.  Head is normocephalic, atraumatic.  No labored breathing.  Speech is clear and coherent with logical content.  Patient is alert and oriented at baseline.    Assessment and Plan: 1. Neck pain  2. Sciatica of left side  Heat, med use and side effects discussed, use muscle relaxants at home, follow up with pcp.   Follow Up Instructions: I discussed the assessment and treatment  plan with the patient. The patient was provided an opportunity to ask questions and all were answered. The patient agreed with the plan and demonstrated an understanding of the instructions.  A copy of instructions were sent to the patient via MyChart unless otherwise noted below.     The patient was advised to call back or seek an in-person evaluation if the symptoms worsen or if the condition fails to improve as anticipated.  Time:  I spent 10 minutes with the patient via telehealth technology discussing the above problems/concerns.    Dellia Nims, FNP

## 2022-02-15 NOTE — Patient Instructions (Signed)
Sciatica  Sciatica is pain, numbness, weakness, or tingling along the path of the sciatic nerve. The sciatic nerve starts in the lower back and runs down the back of each leg. The nerve controls the muscles in the lower leg and in the back of the knee. It also provides feeling (sensation) to the back of the thigh, the lower leg, and the sole of the foot. Sciatica is a symptom of another medical condition that pinches or puts pressure on the sciatic nerve. Sciatica most often only affects one side of the body. Sciatica usually goes away on its own or with treatment. In some cases, sciatica may come back (recur). What are the causes? This condition is caused by pressure on the sciatic nerve or pinching of the nerve. This may be the result of: A disk in between the bones of the spine bulging out too far (herniated disk). Age-related changes in the spinal disks. A pain disorder that affects a muscle in the buttock. Extra bone growth near the sciatic nerve. A break (fracture) of the pelvis. Pregnancy. Tumor. This is rare. What increases the risk? The following factors may make you more likely to develop this condition: Playing sports that place pressure or stress on the spine. Having poor strength and flexibility. A history of back injury or surgery. Sitting for long periods of time. Doing activities that involve repetitive bending or lifting. Obesity. What are the signs or symptoms? Symptoms can vary from mild to very severe. They may include: Any of the following problems in the lower back, leg, hip, or buttock: Mild tingling, numbness, or dull aches. Burning sensations. Sharp pains. Numbness in the back of the calf or the sole of the foot. Leg weakness. Severe back pain that makes movement difficult. Symptoms may get worse when you cough, sneeze, or laugh, or when you sit or stand for long periods of time. How is this diagnosed? This condition may be diagnosed based on: Your symptoms  and medical history. A physical exam. Blood tests. Imaging tests, such as: X-rays. An MRI. A CT scan. How is this treated? In many cases, this condition improves on its own without treatment. However, treatment may include: Reducing or modifying physical activity. Exercising, including strengthening and stretching. Icing and applying heat to the affected area. Medicines that help to: Relieve pain and swelling. Relax your muscles. Injections of medicines that help to relieve pain and inflammation (steroids) around the sciatic nerve. Surgery. Follow these instructions at home: Medicines Take over-the-counter and prescription medicines only as told by your health care provider. Ask your health care provider if the medicine prescribed to you requires you to avoid driving or using heavy machinery. Managing pain     If directed, put ice on the affected area. To do this: Put ice in a plastic bag. Place a towel between your skin and the bag. Leave the ice on for 20 minutes, 2-3 times a day. If your skin turns bright red, remove the ice right away to prevent skin damage. The risk of skin damage is higher if you cannot feel pain, heat, or cold. If directed, apply heat to the affected area as often as told by your health care provider. Use the heat source that your health care provider recommends, such as a moist heat pack or a heating pad. Place a towel between your skin and the heat source. Leave the heat on for 20-30 minutes. If your skin turns bright red, remove the heat right away to prevent burns. The   risk of burns is higher if you cannot feel pain, heat, or cold. Activity  Return to your normal activities as told by your health care provider. Ask your health care provider what activities are safe for you. Avoid activities that make your symptoms worse. Take brief periods of rest throughout the day. When you rest for longer periods, mix in some mild activity or stretching between  periods of rest. This will help to prevent stiffness and pain. Avoid sitting for long periods of time without moving. Get up and move around at least one time each hour. Exercise and stretch regularly as told by your health care provider. Do not lift anything that is heavier than 10 lb (4.5 kg) until your health care provider says that it is safe. When you do not have symptoms, you should still avoid heavy lifting, especially repetitive heavy lifting. When you lift objects, always use proper lifting technique, which includes: Bending your knees. Keeping the load close to your body. Avoiding twisting. General instructions Maintain a healthy weight. Excess weight puts extra stress on your back. Wear supportive, comfortable shoes. Avoid wearing high heels. Avoid sleeping on a mattress that is too soft or too hard. A mattress that is firm enough to support your back when you sleep may help to reduce your pain. Contact a health care provider if: Your pain is not controlled by medicine. Your pain does not improve or gets worse. Your pain lasts longer than 4 weeks. You have unexplained weight loss. Get help right away if: You are not able to control when you urinate or have bowel movements (incontinence). You have: Weakness in your lower back, pelvis, buttocks, or legs that gets worse. Redness or swelling of your back. A burning sensation when you urinate. Summary Sciatica is pain, numbness, weakness, or tingling along the path of the sciatic nerve, which may include the lower back, legs, hips, and buttocks. This condition is caused by pressure on the sciatic nerve or pinching of the nerve. Treatment often includes rest, exercise, medicines, and applying ice or heat. This information is not intended to replace advice given to you by your health care provider. Make sure you discuss any questions you have with your health care provider. Document Revised: 09/22/2021 Document Reviewed:  09/22/2021 Elsevier Patient Education  2023 [employer] Cervical Radiculopathy  Cervical radiculopathy happens when a nerve in the neck (a cervical nerve) is pinched or bruised. This condition can happen because of an injury to the cervical spine (vertebrae) in the neck, or as part of the normal aging process. Pressure on the cervical nerves can cause pain or numbness that travels from the neck all the way down to the arm and fingers. This condition usually gets better with rest. Treatment may be needed if the condition does not improve. What are the causes? This condition may be caused by: A neck injury. A bulging (herniated) disk. Muscle spasms. Muscle tightness in the neck due to overuse. Arthritis. Breakdown or degeneration in the bones and joints of the spine (spondylosis) due to aging. Bone spurs that may develop near the cervical nerves. What are the signs or symptoms? Symptoms of this condition include: Pain. The pain may travel from the neck to the arm and hand. The pain can be severe or irritating. It may get worse when you move your neck. Numbness or tingling in your arm or hand. Weakness in the affected arm and hand, in severe cases. How is this diagnosed? This condition may be diagnosed based  on your symptoms, your medical history, and a physical exam. You may also have tests, including: X-rays. CT scan. MRI. Electromyogram (EMG). Nerve conduction tests. How is this treated? In many cases, treatment is not needed for this condition. With rest, the condition usually gets better over time. If treatment is needed, options may include: Wearing a soft neck collar (cervical collar) for short periods of time. Doing physical therapy to strengthen your neck muscles. Taking medicines. These may include NSAIDs, such as ibuprofen, or oral corticosteroids. Having spinal injections, in severe cases. Having surgery. This may be needed if other treatments do not help. Different types  of surgery may be done depending on the cause of this condition. Follow these instructions at home: If you have a cervical collar: Wear it as told by your health care provider. Remove it only as told by your health care provider. Ask your health care provider if you can remove the cervical collar for cleaning and bathing. If you are allowed to remove the collar for cleaning or bathing: Follow instructions from your health care provider about how to remove the collar safely. Clean the collar by wiping it with mild soap and water and drying it completely. Take out any removable pads in the collar every 1-2 days, and wash them by hand with soap and water. Let them air-dry completely before you put them back in the collar. Check your skin under the collar for irritation or sores. If you see any, tell your health care provider. Managing pain     Take over-the-counter and prescription medicines only as told by your health care provider. If directed, put ice on the affected area. To do this: If you have a soft neck collar, remove it as told by your health care provider. Put ice in a plastic bag. Place a towel between your skin and the bag. Leave the ice on for 20 minutes, 2-3 times a day. Remove the ice if your skin turns bright red. This is very important. If you cannot feel pain, heat, or cold, you have a greater risk of damage to the area. If applying ice does not help, you can try using heat. Use the heat source that your health care provider recommends, such as a moist heat pack or a heating pad. Place a towel between your skin and the heat source. Leave the heat on for 20-30 minutes. Remove the heat if your skin turns bright red. This is especially important if you are unable to feel pain, heat, or cold. You have a greater risk of getting burned. Try a gentle neck and shoulder massage to help relieve symptoms. Activity Rest as needed. Return to your normal activities as told by your health  care provider. Ask your health care provider what activities are safe for you. Do stretching and strengthening exercises as told by your health care provider or your physical therapist. You may have to avoid lifting. Ask your health care provider how much you can safely lift. General instructions Use a flat pillow when you sleep. Do not drive while wearing a cervical collar. If you do not have a cervical collar, ask your health care provider if it is safe to drive while your neck heals. Ask your health care provider if the medicine prescribed to you requires you to avoid driving or using machinery. Do not use any products that contain nicotine or tobacco. These products include cigarettes, chewing tobacco, and vaping devices, such as e-cigarettes. If you need help quitting, ask your  health care provider. Keep all follow-up visits. This is important. Contact a health care provider if: Your condition does not improve with treatment. Get help right away if: Your pain gets much worse and is not controlled with medicines. You have weakness or numbness in your hand, arm, face, or leg. You have a high fever. You have a stiff, rigid neck. You lose control of your bowels or your bladder (have incontinence). You have trouble with walking, balance, or speaking. Summary Cervical radiculopathy happens when a nerve in the neck is pinched or bruised. A nerve can get pinched from a bulging disk, arthritis, muscle spasms, or an injury to the neck. Symptoms include pain, tingling, or numbness radiating from the neck to the arm or hand. Weakness can also occur in severe cases. Treatment may include rest, wearing a cervical collar, and physical therapy. Medicines may be prescribed to help with pain. In severe cases, injections or surgery may be needed. This information is not intended to replace advice given to you by your health care provider. Make sure you discuss any questions you have with your health care  provider. Document Revised: 12/19/2020 Document Reviewed: 12/19/2020 Elsevier Patient Education  2023 [employer].

## 2022-02-17 DIAGNOSIS — Z713 Dietary counseling and surveillance: Secondary | ICD-10-CM | POA: Diagnosis not present

## 2022-02-23 ENCOUNTER — Other Ambulatory Visit: Payer: Self-pay | Admitting: Physical Medicine & Rehabilitation

## 2022-02-23 DIAGNOSIS — M542 Cervicalgia: Secondary | ICD-10-CM | POA: Diagnosis not present

## 2022-02-23 DIAGNOSIS — M5442 Lumbago with sciatica, left side: Secondary | ICD-10-CM | POA: Diagnosis not present

## 2022-02-23 DIAGNOSIS — M5412 Radiculopathy, cervical region: Secondary | ICD-10-CM

## 2022-02-23 DIAGNOSIS — G8929 Other chronic pain: Secondary | ICD-10-CM | POA: Diagnosis not present

## 2022-02-23 DIAGNOSIS — M5441 Lumbago with sciatica, right side: Secondary | ICD-10-CM | POA: Diagnosis not present

## 2022-02-23 DIAGNOSIS — M9931 Osseous stenosis of neural canal of cervical region: Secondary | ICD-10-CM | POA: Diagnosis not present

## 2022-02-23 DIAGNOSIS — M48062 Spinal stenosis, lumbar region with neurogenic claudication: Secondary | ICD-10-CM | POA: Diagnosis not present

## 2022-03-04 ENCOUNTER — Encounter (INDEPENDENT_AMBULATORY_CARE_PROVIDER_SITE_OTHER): Payer: Self-pay

## 2022-03-06 MED ORDER — SILVER SULFADIAZINE 1 % EX CREA: 1.0000 | TOPICAL_CREAM | Freq: Every day | CUTANEOUS | 0 refills | Status: DC

## 2022-03-06 NOTE — Telephone Encounter (Signed)
Patient called again wanting to find out if Sheppard Plumber, NP could advise.

## 2022-03-09 ENCOUNTER — Other Ambulatory Visit: Payer: Medicaid Other

## 2022-03-17 ENCOUNTER — Ambulatory Visit
Admission: RE | Admit: 2022-03-17 | Discharge: 2022-03-17 | Disposition: A | Discharge status: Home / Self Care | Payer: Medicare Other | Source: Ambulatory Visit | Attending: Physical Medicine & Rehabilitation | Admitting: Physical Medicine & Rehabilitation

## 2022-03-17 DIAGNOSIS — M4722 Other spondylosis with radiculopathy, cervical region: Secondary | ICD-10-CM | POA: Diagnosis not present

## 2022-03-17 DIAGNOSIS — M5412 Radiculopathy, cervical region: Secondary | ICD-10-CM

## 2022-03-17 MED ORDER — IOPAMIDOL (ISOVUE-M 300) INJECTION 61%
1.0000 mL | Freq: Once | INTRAMUSCULAR | Status: AC
  Administered 2022-03-17: 1 mL via EPIDURAL

## 2022-03-17 MED ORDER — TRIAMCINOLONE ACETONIDE 40 MG/ML IJ SUSP (RADIOLOGY)
60.0000 mg | Freq: Once | INTRAMUSCULAR | Status: AC
  Administered 2022-03-17: 60 mg via EPIDURAL

## 2022-03-17 NOTE — Discharge Instructions (Signed)

## 2022-03-22 ENCOUNTER — Encounter: Payer: Self-pay | Admitting: Family Medicine

## 2022-03-23 ENCOUNTER — Other Ambulatory Visit: Payer: Self-pay

## 2022-03-23 DIAGNOSIS — K219 Gastro-esophageal reflux disease without esophagitis: Secondary | ICD-10-CM

## 2022-03-23 MED ORDER — OMEPRAZOLE 40 MG PO CPDR: 40.0000 mg | DELAYED_RELEASE_CAPSULE | Freq: Every day | ORAL | 1 refills | Status: DC

## 2022-03-24 ENCOUNTER — Other Ambulatory Visit: Payer: Self-pay | Admitting: Family Medicine

## 2022-03-24 DIAGNOSIS — M4802 Spinal stenosis, cervical region: Secondary | ICD-10-CM

## 2022-03-24 DIAGNOSIS — M5412 Radiculopathy, cervical region: Secondary | ICD-10-CM

## 2022-03-29 HISTORY — PX: GASTRIC BYPASS: SHX52

## 2022-04-06 ENCOUNTER — Other Ambulatory Visit: Payer: Self-pay | Admitting: Family Medicine

## 2022-04-06 ENCOUNTER — Ambulatory Visit: Payer: Medicaid Other

## 2022-04-06 DIAGNOSIS — I83893 Varicose veins of bilateral lower extremities with other complications: Secondary | ICD-10-CM

## 2022-04-07 NOTE — Telephone Encounter (Signed)
Requested medication (s) are due for refill today: yes  Requested medication (s) are on the active medication list: yes    Last refill: 12/19/21  #30  2 refills  Future visit scheduled yes 06/11/22  Notes to clinic: Failed due to labs, please review. Thank you.  Requested Prescriptions  Pending Prescriptions Disp Refills   furosemide (LASIX) 40 MG tablet [Pharmacy Med Name: FUROSEMIDE 40MG  TABLETS] 30 tablet 2    Sig: TAKE 1 TABLET(40 MG) BY MOUTH DAILY FLUID AS NEEDED FOR SWELLING     Cardiovascular:  Diuretics - Loop Failed - 04/06/2022  3:19 AM      Failed - K in normal range and within 180 days    Potassium  Date Value Ref Range Status  04/28/2017 4.2 3.5 - 5.1 mmol/L Final         Failed - Ca in normal range and within 180 days    Calcium  Date Value Ref Range Status  04/28/2017 9.0 8.9 - 10.3 mg/dL Final         Failed - Na in normal range and within 180 days    Sodium  Date Value Ref Range Status  04/28/2017 138 135 - 145 mmol/L Final         Failed - Cr in normal range and within 180 days    Creatinine, Ser  Date Value Ref Range Status  04/28/2017 0.88 0.44 - 1.00 mg/dL Final         Failed - Cl in normal range and within 180 days    Chloride  Date Value Ref Range Status  04/28/2017 104 101 - 111 mmol/L Final         Failed - Mg Level in normal range and within 180 days    No results found for: "MG"       Passed - Last BP in normal range    BP Readings from Last 1 Encounters:  03/17/22 (!) 138/58         Passed - Valid encounter within last 6 months    Recent Outpatient Visits           2 months ago Acute cystitis with hematuria   [place], DO   3 months ago Varicose veins of lower extremity with edema, bilateral   [place], DO   4 months ago Acute non-recurrent frontal sinusitis   [place], DO   5 months ago  Essential hypertension   [place], DO       Future Appointments             In 2 months [place], Devonne Doughty, DO [facility]

## 2022-04-08 DIAGNOSIS — Z713 Dietary counseling and surveillance: Secondary | ICD-10-CM | POA: Diagnosis not present

## 2022-04-09 DIAGNOSIS — Z9884 Bariatric surgery status: Secondary | ICD-10-CM | POA: Diagnosis not present

## 2022-04-09 DIAGNOSIS — Z79899 Other long term (current) drug therapy: Secondary | ICD-10-CM | POA: Diagnosis not present

## 2022-04-09 DIAGNOSIS — Z01818 Encounter for other preprocedural examination: Secondary | ICD-10-CM | POA: Diagnosis not present

## 2022-04-09 DIAGNOSIS — Z87891 Personal history of nicotine dependence: Secondary | ICD-10-CM | POA: Diagnosis not present

## 2022-04-09 DIAGNOSIS — I10 Essential (primary) hypertension: Secondary | ICD-10-CM | POA: Diagnosis not present

## 2022-04-09 DIAGNOSIS — Z713 Dietary counseling and surveillance: Secondary | ICD-10-CM | POA: Diagnosis not present

## 2022-04-10 ENCOUNTER — Encounter (INDEPENDENT_AMBULATORY_CARE_PROVIDER_SITE_OTHER): Payer: Self-pay

## 2022-04-22 DIAGNOSIS — Z01818 Encounter for other preprocedural examination: Secondary | ICD-10-CM | POA: Diagnosis not present

## 2022-04-22 DIAGNOSIS — E8809 Other disorders of plasma-protein metabolism, not elsewhere classified: Secondary | ICD-10-CM | POA: Diagnosis not present

## 2022-04-22 DIAGNOSIS — I10 Essential (primary) hypertension: Secondary | ICD-10-CM | POA: Diagnosis not present

## 2022-04-22 DIAGNOSIS — Z79899 Other long term (current) drug therapy: Secondary | ICD-10-CM | POA: Diagnosis not present

## 2022-04-22 DIAGNOSIS — G4733 Obstructive sleep apnea (adult) (pediatric): Secondary | ICD-10-CM | POA: Diagnosis not present

## 2022-04-22 DIAGNOSIS — K219 Gastro-esophageal reflux disease without esophagitis: Secondary | ICD-10-CM | POA: Diagnosis not present

## 2022-04-22 DIAGNOSIS — R7303 Prediabetes: Secondary | ICD-10-CM | POA: Diagnosis not present

## 2022-04-22 DIAGNOSIS — I890 Lymphedema, not elsewhere classified: Secondary | ICD-10-CM | POA: Diagnosis not present

## 2022-04-24 ENCOUNTER — Ambulatory Visit
Admission: RE | Admit: 2022-04-24 | Discharge: 2022-04-24 | Disposition: A | Discharge status: Home / Self Care | Payer: Medicaid Other | Source: Ambulatory Visit | Attending: Family Medicine | Admitting: Family Medicine

## 2022-04-24 DIAGNOSIS — M5412 Radiculopathy, cervical region: Secondary | ICD-10-CM

## 2022-04-24 DIAGNOSIS — M47812 Spondylosis without myelopathy or radiculopathy, cervical region: Secondary | ICD-10-CM | POA: Diagnosis not present

## 2022-04-24 DIAGNOSIS — M4802 Spinal stenosis, cervical region: Secondary | ICD-10-CM

## 2022-04-27 ENCOUNTER — Encounter (INDEPENDENT_AMBULATORY_CARE_PROVIDER_SITE_OTHER): Payer: Self-pay

## 2022-04-27 DIAGNOSIS — R112 Nausea with vomiting, unspecified: Secondary | ICD-10-CM | POA: Diagnosis not present

## 2022-04-27 DIAGNOSIS — Z79899 Other long term (current) drug therapy: Secondary | ICD-10-CM | POA: Diagnosis not present

## 2022-04-27 DIAGNOSIS — R0682 Tachypnea, not elsewhere classified: Secondary | ICD-10-CM | POA: Diagnosis not present

## 2022-04-27 DIAGNOSIS — R000 Tachycardia, unspecified: Secondary | ICD-10-CM | POA: Diagnosis not present

## 2022-04-27 DIAGNOSIS — I890 Lymphedema, not elsewhere classified: Secondary | ICD-10-CM | POA: Diagnosis not present

## 2022-04-27 DIAGNOSIS — Z87891 Personal history of nicotine dependence: Secondary | ICD-10-CM | POA: Diagnosis not present

## 2022-04-27 DIAGNOSIS — Z91048 Other nonmedicinal substance allergy status: Secondary | ICD-10-CM | POA: Diagnosis not present

## 2022-04-27 DIAGNOSIS — Z888 Allergy status to other drugs, medicaments and biological substances status: Secondary | ICD-10-CM | POA: Diagnosis not present

## 2022-04-27 DIAGNOSIS — G4730 Sleep apnea, unspecified: Secondary | ICD-10-CM | POA: Diagnosis not present

## 2022-04-27 DIAGNOSIS — R109 Unspecified abdominal pain: Secondary | ICD-10-CM | POA: Diagnosis not present

## 2022-04-27 DIAGNOSIS — J690 Pneumonitis due to inhalation of food and vomit: Secondary | ICD-10-CM | POA: Diagnosis not present

## 2022-04-27 DIAGNOSIS — Z9884 Bariatric surgery status: Secondary | ICD-10-CM | POA: Diagnosis not present

## 2022-04-27 DIAGNOSIS — G4733 Obstructive sleep apnea (adult) (pediatric): Secondary | ICD-10-CM | POA: Diagnosis not present

## 2022-04-27 DIAGNOSIS — E8809 Other disorders of plasma-protein metabolism, not elsewhere classified: Secondary | ICD-10-CM | POA: Diagnosis not present

## 2022-04-27 DIAGNOSIS — G8929 Other chronic pain: Secondary | ICD-10-CM | POA: Diagnosis not present

## 2022-04-27 DIAGNOSIS — K5989 Other specified functional intestinal disorders: Secondary | ICD-10-CM | POA: Diagnosis not present

## 2022-04-27 DIAGNOSIS — I10 Essential (primary) hypertension: Secondary | ICD-10-CM | POA: Diagnosis not present

## 2022-04-27 DIAGNOSIS — N179 Acute kidney failure, unspecified: Secondary | ICD-10-CM | POA: Diagnosis not present

## 2022-04-27 DIAGNOSIS — M5412 Radiculopathy, cervical region: Secondary | ICD-10-CM | POA: Diagnosis not present

## 2022-04-27 DIAGNOSIS — M5430 Sciatica, unspecified side: Secondary | ICD-10-CM | POA: Diagnosis not present

## 2022-04-27 DIAGNOSIS — R7303 Prediabetes: Secondary | ICD-10-CM | POA: Diagnosis not present

## 2022-04-27 DIAGNOSIS — Z8614 Personal history of Methicillin resistant Staphylococcus aureus infection: Secondary | ICD-10-CM | POA: Diagnosis not present

## 2022-04-27 DIAGNOSIS — R0902 Hypoxemia: Secondary | ICD-10-CM | POA: Diagnosis not present

## 2022-04-27 DIAGNOSIS — K219 Gastro-esophageal reflux disease without esophagitis: Secondary | ICD-10-CM | POA: Diagnosis not present

## 2022-05-03 ENCOUNTER — Encounter: Payer: Self-pay | Admitting: Family Medicine

## 2022-05-03 DIAGNOSIS — I10 Essential (primary) hypertension: Secondary | ICD-10-CM

## 2022-05-04 ENCOUNTER — Ambulatory Visit
Admission: RE | Admit: 2022-05-04 | Discharge: 2022-05-04 | Disposition: A | Discharge status: Home / Self Care | Payer: Medicare Other | Attending: Family Medicine | Admitting: Family Medicine

## 2022-05-04 ENCOUNTER — Ambulatory Visit (INDEPENDENT_AMBULATORY_CARE_PROVIDER_SITE_OTHER): Payer: Medicare Other | Admitting: Family Medicine

## 2022-05-04 ENCOUNTER — Ambulatory Visit
Admission: RE | Admit: 2022-05-04 | Discharge: 2022-05-04 | Disposition: A | Discharge status: Home / Self Care | Payer: Medicare Other | Source: Ambulatory Visit | Attending: Family Medicine | Admitting: Family Medicine

## 2022-05-04 ENCOUNTER — Encounter: Payer: Self-pay | Admitting: Family Medicine

## 2022-05-04 VITALS — BP 151/95 | HR 96 | Ht 67.0 in | Wt 369.0 lb

## 2022-05-04 DIAGNOSIS — Z9884 Bariatric surgery status: Secondary | ICD-10-CM

## 2022-05-04 DIAGNOSIS — R0602 Shortness of breath: Secondary | ICD-10-CM

## 2022-05-04 DIAGNOSIS — R0600 Dyspnea, unspecified: Secondary | ICD-10-CM | POA: Diagnosis not present

## 2022-05-04 DIAGNOSIS — R051 Acute cough: Secondary | ICD-10-CM | POA: Diagnosis not present

## 2022-05-04 DIAGNOSIS — B379 Candidiasis, unspecified: Secondary | ICD-10-CM | POA: Diagnosis not present

## 2022-05-04 DIAGNOSIS — J690 Pneumonitis due to inhalation of food and vomit: Secondary | ICD-10-CM

## 2022-05-04 DIAGNOSIS — R059 Cough, unspecified: Secondary | ICD-10-CM | POA: Diagnosis not present

## 2022-05-04 MED ORDER — FLUCONAZOLE 150 MG PO TABS: ORAL_TABLET | ORAL | 0 refills | Status: DC

## 2022-05-04 MED ORDER — AMOXICILLIN-POT CLAVULANATE 875-125 MG PO TABS: 1.0000 | ORAL_TABLET | Freq: Two times a day (BID) | ORAL | 0 refills | Status: DC

## 2022-05-04 NOTE — Progress Notes (Signed)
Subjective:    Patient ID: Suzanne Wilcox, female    DOB: September 01, 1975, 46 y.o.   MRN: [id number]  Suzanne Wilcox is a 46 y.o. female presenting on 05/04/2022 for Pneumonia   HPI  [facility] 04/27/22 S/p Gastric Bypass Laparoscopic Roux-En Y Secondary bacterial pneumonia  HOSPITAL FOLLOW-UP VISIT  Hospital/Location: [facility] Date of Admission: 04/27/22 Date of Discharge: 05/02/22 Transitions of care telephone call: Not completed.  Reason for Admission: Gastric Bypass / Aspiration Pneumonia  FOLLOW-UP  - Hospital H&P and Discharge Summary have been reviewed - Patient presents today about 2 days after recent hospitalization. Brief summary of recent course, patient proceeded with electric gastric bypass surgery on 04/27/22, hospitalized, and later found to have aspiration pneumonia post-op on 04/30/22 started IV antibiotics Zosyn, completed course, not discharged on oral antibiotic, on lovenox  - Today reports overall has done well after discharge. Symptoms of shortness of breath and cough still present, some improved. She was intubated for surgery and has had some issues following that. History of OSA  She has surgeon follow up next week for wound checks, has some bruising but incisions are doing well  I have reviewed the discharge medication list, and have reconciled the current and discharge medications today.       12/19/2021   10:22 AM 11/28/2021    9:39 AM 10/29/2021   10:35 AM  Depression screen PHQ 2/9  Decreased Interest 2 2 1   Down, Depressed, Hopeless 1 1 1   PHQ - 2 Score 3 3 2   Altered sleeping 2 3 3   Tired, decreased energy 3 3 2   Change in appetite 3 3 3   Feeling bad or failure about yourself  2 2 2   Trouble concentrating 2 1 2   Moving slowly or fidgety/restless 0 0 2  Suicidal thoughts 0 0 0  PHQ-9 Score 15 15 16   Difficult doing work/chores Very difficult Extremely dIfficult Somewhat difficult    Social History   Tobacco Use   Smoking status:  Former    Packs/day: 1.00    Years: 18.00    Total pack years: 18.00    Types: Cigarettes    Quit date: 04/29/2009    Years since quitting: 13.0   Smokeless tobacco: Never  Vaping Use   Vaping Use: Never used  Substance Use Topics   Alcohol use: Not Currently    Comment: OCCASION   Drug use: No    Review of Systems Per HPI unless specifically indicated above     Objective:    BP (!) 151/95   Pulse 96   Ht 5\' 7"  (1.702 m)   Wt (!) 369 lb (167.4 kg)   SpO2 96%   BMI 57.79 kg/m   Wt Readings from Last 3 Encounters:  05/04/22 (!) 369 lb (167.4 kg)  02/06/22 (!) 378 lb (171.5 kg)  01/19/22 (!) 378 lb (171.5 kg)    Physical Exam Vitals and nursing note reviewed.  Constitutional:      General: She is not in acute distress.    Appearance: She is well-developed. She is obese. She is not diaphoretic.     Comments: Well-appearing, comfortable, cooperative  HENT:     Head: Normocephalic and atraumatic.  Eyes:     General:        Right eye: No discharge.        Left eye: No discharge.     Conjunctiva/sclera: Conjunctivae normal.  Neck:     Thyroid: No thyromegaly.  Cardiovascular:  Rate and Rhythm: Normal rate and regular rhythm.     Heart sounds: Normal heart sounds. No murmur heard. Pulmonary:     Effort: Pulmonary effort is normal. No respiratory distress.     Breath sounds: Normal breath sounds. No wheezing or rales.     Comments: Mild reduced air movement at lung bases Musculoskeletal:        General: Normal range of motion.     Cervical back: Normal range of motion and neck supple.  Lymphadenopathy:     Cervical: No cervical adenopathy.  Skin:    General: Skin is warm and dry.     Findings: No erythema or rash.     Comments: Lap incisions on abdomen. Did not review all wounds today, notable bruising and reviewed photos provided by patient with no wound complications  Neurological:     Mental Status: She is alert and oriented to person, place, and time.   Psychiatric:        Behavior: Behavior normal.     Comments: Well groomed, good eye contact, normal speech and thoughts    I have personally reviewed the radiology report from 05/04/22 CXR.   CLINICAL DATA:  Dyspnea and cough. Follow-up aspiration pneumonia. Recent gastric bypass.   EXAM: CHEST - 2 VIEW   COMPARISON:  Chest radiographs 01/25/2016   FINDINGS: The cardiomediastinal silhouette is within normal limits. The lungs are hypoinflated with streaky bibasilar opacities and possible trace pleural effusions. No pneumothorax is identified. No acute osseous abnormality is seen.   IMPRESSION: Streaky bibasilar opacities which could reflect atelectasis or pneumonia.     Electronically Signed   By: Sebastian Ache M.D.   On: 05/04/2022 12:13  Results for orders placed or performed in visit on 01/19/22  Urine Culture   Specimen: Urine  Result Value Ref Range   MICRO NUMBER: [id number]    SPECIMEN QUALITY: Adequate    Sample Source URINE, CLEAN CATCH    STATUS: FINAL    Result: No Growth   POCT Urinalysis Dipstick  Result Value Ref Range   Color, UA Orange    Clarity, UA Cloudy    Glucose, UA Negative Negative   Bilirubin, UA Negative    Ketones, UA Negative    Spec Grav, UA 1.020 1.010 - 1.025   Blood, UA Moderate ++    pH, UA 5.0 5.0 - 8.0   Protein, UA Negative Negative   Urobilinogen, UA 0.2 0.2 or 1.0 E.U./dL   Nitrite, UA Negative    Leukocytes, UA Trace (A) Negative   Appearance     Odor        Assessment & Plan:   Problem List Items Addressed This Visit   None Visit Diagnoses     Aspiration pneumonitis (HCC)    -  Primary   Relevant Medications   amoxicillin-clavulanate (AUGMENTIN) 875-125 MG tablet   Other Relevant Orders   DG Chest 2 View   History of Roux-en-Y gastric bypass       Shortness of breath       Relevant Medications   amoxicillin-clavulanate (AUGMENTIN) 875-125 MG tablet   Other Relevant Orders   DG Chest 2 View   Acute cough        Relevant Medications   amoxicillin-clavulanate (AUGMENTIN) 875-125 MG tablet   Other Relevant Orders   DG Chest 2 View   Antibiotic-induced yeast infection       Relevant Medications   fluconazole (DIFLUCAN) 150 MG tablet  HFU S/p Lap Gastric Bypass 04/27/22 Post op day 7  Secondary aspiration pneumonitis in hospital, treated but still has dyspnea and cough  Lower airways still not opening all the way and also with the intubation swelling contributing Post op atelectasis as well Recommend find incentive spirometer  We will extend antibiotic Augmentin course for aspiration pneumonia coverage  Chest X-ray STAT today to follow up on - results show streaky bibasilar opacity, on exam most suggestive of atelectasis, but still providing empiric Augmentin coverage.  Added Diflucan.  Upcoming post-op surgical wound check scheduled    Orders Placed This Encounter  Procedures   DG Chest 2 View    Standing Status:   Future    Standing Expiration Date:   05/05/2023    Order Specific Question:   Reason for Exam (SYMPTOM  OR DIAGNOSIS REQUIRED)    Answer:   dyspnea, cough, follow-up aspiration pneumonia from hospital 04/30/22    Order Specific Question:   Is patient pregnant?    Answer:   No    Order Specific Question:   Preferred imaging location?    Answer:   [facility]     Meds ordered this encounter  Medications   amoxicillin-clavulanate (AUGMENTIN) 875-125 MG tablet    Sig: Take 1 tablet by mouth 2 (two) times daily.    Dispense:  20 tablet    Refill:  0   fluconazole (DIFLUCAN) 150 MG tablet    Sig: Take one every other day for 10 days    Dispense:  5 tablet    Refill:  0      Follow up plan: Return if symptoms worsen or fail to improve.   Nobie Putnam, [facility] 05/04/2022, 11:35 AM

## 2022-05-04 NOTE — Patient Instructions (Addendum)
Thank you for coming to the office today.  Lower airways still not opening all the way and also with the intubation swelling contributing  We will extend antibiotic Augmentin course for aspiration pneumonia coverage  Added Diflucan.  Check for an incentive spirometer at one of these locations.  [facility] [address] [place], [place] [address] Ph: [place]. [address] [place], [place] [address] Ph: [place] ([employer] etc) Retail: [phone] Customer Service: [phone] Toll Free: [phone]  [facility] [address] [place], [place] [address] Open until [place] Phone: [phone]   Please schedule a Follow-up Appointment to: Return if symptoms worsen or fail to improve.  If you have any other questions or concerns, please feel free to call the office or send a message through [place]. You may also schedule an earlier appointment if necessary.  Additionally, you may be receiving a survey about your experience at our office within a few days to 1 week by e-mail or mail. We value your feedback.  Nobie Putnam, DO [place]

## 2022-05-05 MED ORDER — AMLODIPINE BESYLATE 5 MG PO TABS: 5.0000 mg | ORAL_TABLET | Freq: Every day | ORAL | 1 refills | Status: DC

## 2022-05-11 DIAGNOSIS — M47812 Spondylosis without myelopathy or radiculopathy, cervical region: Secondary | ICD-10-CM | POA: Diagnosis not present

## 2022-05-11 DIAGNOSIS — M5412 Radiculopathy, cervical region: Secondary | ICD-10-CM | POA: Diagnosis not present

## 2022-05-11 DIAGNOSIS — Z79891 Long term (current) use of opiate analgesic: Secondary | ICD-10-CM | POA: Diagnosis not present

## 2022-05-12 DIAGNOSIS — Z48815 Encounter for surgical aftercare following surgery on the digestive system: Secondary | ICD-10-CM | POA: Diagnosis not present

## 2022-05-12 DIAGNOSIS — K912 Postsurgical malabsorption, not elsewhere classified: Secondary | ICD-10-CM | POA: Diagnosis not present

## 2022-05-12 DIAGNOSIS — Z9884 Bariatric surgery status: Secondary | ICD-10-CM | POA: Diagnosis not present

## 2022-05-12 DIAGNOSIS — Z713 Dietary counseling and surveillance: Secondary | ICD-10-CM | POA: Diagnosis not present

## 2022-05-25 ENCOUNTER — Ambulatory Visit: Payer: Self-pay

## 2022-05-25 NOTE — Telephone Encounter (Signed)
Woke up Sunday fever and body aches, pulled muscle in back, hoarse, right earache, fever 100 since yesterday Denies SOB Cough- phlegm does not know color  Chief Complaint: muscle aches Symptoms: fever, cold and flu sx, fatigue, right earache,cough Frequency: fever began yesterday Pertinent Negatives: Patient denies SOB,  Disposition: []ED /[]Urgent Care (no appt availability in office) / [x]Appointment(In office/virtual)/ [] [place] [facility]/ []Home Care/ []Refused Recommended Disposition /[][place] [facility]/ [] Follow-up with PCP Additional Notes: made appt at next available with PCP Pt did not tale Augmentin as ordered. Advised pt to start med. Please ask provider to review.    Reason for Disposition  [1] MODERATE pain (e.g., interferes with normal activities) AND [2] present > 3 days  Answer Assessment - Initial Assessment Questions 1. ONSET: "When did the muscle aches or body pains start?"      Friday   2. LOCATION: "What part of your body is hurting?" (e.g., entire body, arms, legs)      Muscle aches 3. SEVERITY: "How bad is the pain?" (Scale 1-10; or mild, moderate, severe)   - MILD (1-3): doesn\'t interfere with normal activities    - MODERATE (4-7): interferes with normal activities or awakens from sleep    - SEVERE (8-10):  excruciating pain, unable to do any normal activities      moderate 4. CAUSE: "What do you think is causing the pains?"     Virus? 5. FEVER: "Have you been having fever?"     10 0.0 6. OTHER SYMPTOMS: "Do you have any other symptoms?" (e.g., chest pain, weakness, rash, cold or flu symptoms, weight loss)     Cold and flu, fatigue, right earache 7. PREGNANCY: "Is there any chance you are pregnant?" "When was your last menstrual period?"     N/a 8. TRAVEL: "Have you traveled out of the country in the last month?" (e.g., travel history, exposures)     N/a  Protocols used: Muscle Aches and Body Pain-A-AH

## 2022-05-26 NOTE — Telephone Encounter (Addendum)
She was prescribed Augmentin back early November for possible aspiration pneumonia. That is a totally separate thing than this current symptom report it seems.  If she never took augmentin, that is fine if she prefers to take it now.   Also I would expect her to have received counseling on doing home COVID testing, and report any other sick contacts RSV Flu COVID etc, we are experiencing increase #s of RSV and variant of COVID now. So this should be done first before her in person apt.   She may need swabs when she arrives to test for these conditions. Or she can do virtual and come by for swabs.  Saralyn Pilar, DO [facility] 05/26/2022, 10:45 AM

## 2022-05-28 ENCOUNTER — Ambulatory Visit (INDEPENDENT_AMBULATORY_CARE_PROVIDER_SITE_OTHER): Payer: Medicare Other | Admitting: Family Medicine

## 2022-05-28 ENCOUNTER — Encounter: Payer: Self-pay | Admitting: Family Medicine

## 2022-05-28 VITALS — BP 149/92 | HR 102 | Ht 67.0 in | Wt 348.0 lb

## 2022-05-28 DIAGNOSIS — J0110 Acute frontal sinusitis, unspecified: Secondary | ICD-10-CM

## 2022-05-28 DIAGNOSIS — J9801 Acute bronchospasm: Secondary | ICD-10-CM

## 2022-05-28 MED ORDER — HYDROCOD POLI-CHLORPHE POLI ER 10-8 MG/5ML PO SUER: 5.0000 mL | Freq: Two times a day (BID) | ORAL | 0 refills | Status: DC | PRN

## 2022-05-28 NOTE — Patient Instructions (Signed)
° °  Please schedule a Follow-up Appointment to: No follow-ups on file. ° °If you have any other questions or concerns, please feel free to call the office or send a message through MyChart. You may also schedule an earlier appointment if necessary. ° °Additionally, you may be receiving a survey about your experience at our office within a few days to 1 week by e-mail or mail. We value your feedback. ° °Manolito Jurewicz, DO °[facility] °

## 2022-05-28 NOTE — Progress Notes (Signed)
Subjective:    Patient ID: Suzanne Wilcox, female    DOB: 1975-07-15, 46 y.o.   MRN: [id number]  Suzanne Wilcox is a 46 y.o. female presenting on 05/28/2022 for Cough   HPI  Cough URI Initially felt hoarse voice sick then woke up weekend with fever sore throat and cough, some pain with coughing on abdomen and throat.  Some mild productive cough. - COVID Negative testing. - Fevers improved. - Previously treated 11/6 with possible residual aspiration pneumonia from post op, but never started the Augmentin . She is now on it for 3 days and some improved, has 7 more days.      05/28/2022    8:43 AM 12/19/2021   10:22 AM 11/28/2021    9:39 AM  Depression screen PHQ 2/9  Decreased Interest 3 2 2   Down, Depressed, Hopeless 1 1 1   PHQ - 2 Score 4 3 3   Altered sleeping 3 2 3   Tired, decreased energy 3 3 3   Change in appetite 2 3 3   Feeling bad or failure about yourself  2 2 2   Trouble concentrating 2 2 1   Moving slowly or fidgety/restless 0 0 0  Suicidal thoughts 0 0 0  PHQ-9 Score 16 15 15   Difficult doing work/chores Very difficult Very difficult Extremely dIfficult    Social History   Tobacco Use   Smoking status: Former    Packs/day: 1.00    Years: 18.00    Total pack years: 18.00    Types: Cigarettes    Quit date: 04/29/2009    Years since quitting: 13.0   Smokeless tobacco: Never  Vaping Use   Vaping Use: Never used  Substance Use Topics   Alcohol use: Not Currently    Comment: OCCASION   Drug use: No    Review of Systems Per HPI unless specifically indicated above     Objective:    BP (!) 149/92   Pulse (!) 102   Ht 5\' 7"  (1.702 m)   Wt (!) 348 lb (157.9 kg)   SpO2 98%   BMI 54.50 kg/m   Wt Readings from Last 3 Encounters:  05/28/22 (!) 348 lb (157.9 kg)  05/04/22 (!) 369 lb (167.4 kg)  02/06/22 (!) 378 lb (171.5 kg)    Physical Exam Vitals and nursing note reviewed.  Constitutional:      General: She is not in acute distress.    Appearance:  She is well-developed. She is not diaphoretic.     Comments: Well-appearing, comfortable, cooperative  HENT:     Head: Normocephalic and atraumatic.  Eyes:     General:        Right eye: No discharge.        Left eye: No discharge.     Conjunctiva/sclera: Conjunctivae normal.  Neck:     Thyroid: No thyromegaly.  Cardiovascular:     Rate and Rhythm: Normal rate and regular rhythm.     Heart sounds: Normal heart sounds. No murmur heard. Pulmonary:     Effort: Pulmonary effort is normal. No respiratory distress.     Breath sounds: Normal breath sounds. No wheezing or rales.     Comments: cough Musculoskeletal:        General: Normal range of motion.     Cervical back: Normal range of motion and neck supple.  Lymphadenopathy:     Cervical: No cervical adenopathy.  Skin:    General: Skin is warm and dry.     Findings: No erythema  or rash.  Neurological:     Mental Status: She is alert and oriented to person, place, and time.  Psychiatric:        Behavior: Behavior normal.     Comments: Well groomed, good eye contact, normal speech and thoughts       Results for orders placed or performed in visit on 01/19/22  Urine Culture   Specimen: Urine  Result Value Ref Range   MICRO NUMBER: [id number]    SPECIMEN QUALITY: Adequate    Sample Source URINE, CLEAN CATCH    STATUS: FINAL    Result: No Growth   POCT Urinalysis Dipstick  Result Value Ref Range   Color, UA Orange    Clarity, UA Cloudy    Glucose, UA Negative Negative   Bilirubin, UA Negative    Ketones, UA Negative    Spec Grav, UA 1.020 1.010 - 1.025   Blood, UA Moderate ++    pH, UA 5.0 5.0 - 8.0   Protein, UA Negative Negative   Urobilinogen, UA 0.2 0.2 or 1.0 E.U./dL   Nitrite, UA Negative    Leukocytes, UA Trace (A) Negative   Appearance     Odor        Assessment & Plan:   Problem List Items Addressed This Visit   None Visit Diagnoses     Acute non-recurrent frontal sinusitis    -  Primary   Relevant  Medications   chlorpheniramine-HYDROcodone (TUSSIONEX) 10-8 MG/5ML   Cough due to bronchospasm       Relevant Medications   chlorpheniramine-HYDROcodone (TUSSIONEX) 10-8 MG/5ML       Sinusitis / Bronchitis Previous concern for aspiration PNEUMONIA has resolved. Prior X-ray reviewed  Will agree with plan to continue current Augmentin she is already taking, can cover sinus / lower respiratory Will add cough syrup due to post-op concerns with her cough causing discomfort.  Meds ordered this encounter  Medications   chlorpheniramine-HYDROcodone (TUSSIONEX) 10-8 MG/5ML    Sig: Take 5 mLs by mouth every 12 (twelve) hours as needed for cough.    Dispense:  115 mL    Refill:  0     Follow up plan: Return if symptoms worsen or fail to improve.   Saralyn Pilar, DO [facility] 05/28/2022, 8:55 AM

## 2022-06-09 ENCOUNTER — Encounter: Payer: Self-pay | Admitting: Family Medicine

## 2022-06-10 ENCOUNTER — Encounter: Payer: Medicare Other | Admitting: Family Medicine

## 2022-06-16 DIAGNOSIS — Z79891 Long term (current) use of opiate analgesic: Secondary | ICD-10-CM | POA: Diagnosis not present

## 2022-06-16 DIAGNOSIS — M5412 Radiculopathy, cervical region: Secondary | ICD-10-CM | POA: Diagnosis not present

## 2022-06-16 DIAGNOSIS — M47812 Spondylosis without myelopathy or radiculopathy, cervical region: Secondary | ICD-10-CM | POA: Diagnosis not present

## 2022-06-18 DIAGNOSIS — I890 Lymphedema, not elsewhere classified: Secondary | ICD-10-CM | POA: Diagnosis not present

## 2022-07-03 DIAGNOSIS — M542 Cervicalgia: Secondary | ICD-10-CM | POA: Diagnosis not present

## 2022-07-03 DIAGNOSIS — D509 Iron deficiency anemia, unspecified: Secondary | ICD-10-CM | POA: Diagnosis not present

## 2022-07-03 DIAGNOSIS — Z4889 Encounter for other specified surgical aftercare: Secondary | ICD-10-CM | POA: Diagnosis not present

## 2022-07-03 DIAGNOSIS — I10 Essential (primary) hypertension: Secondary | ICD-10-CM | POA: Diagnosis not present

## 2022-07-03 DIAGNOSIS — Z9884 Bariatric surgery status: Secondary | ICD-10-CM | POA: Diagnosis not present

## 2022-07-06 ENCOUNTER — Encounter: Payer: Self-pay | Admitting: Family Medicine

## 2022-07-06 ENCOUNTER — Ambulatory Visit (INDEPENDENT_AMBULATORY_CARE_PROVIDER_SITE_OTHER): Payer: Medicare Other | Admitting: Family Medicine

## 2022-07-06 VITALS — BP 132/88 | HR 95 | Ht 67.0 in | Wt 327.0 lb

## 2022-07-06 DIAGNOSIS — E782 Mixed hyperlipidemia: Secondary | ICD-10-CM

## 2022-07-06 DIAGNOSIS — Z9884 Bariatric surgery status: Secondary | ICD-10-CM

## 2022-07-06 DIAGNOSIS — R7309 Other abnormal glucose: Secondary | ICD-10-CM | POA: Diagnosis not present

## 2022-07-06 DIAGNOSIS — H4422 Degenerative myopia, left eye: Secondary | ICD-10-CM | POA: Diagnosis not present

## 2022-07-06 DIAGNOSIS — H04123 Dry eye syndrome of bilateral lacrimal glands: Secondary | ICD-10-CM | POA: Diagnosis not present

## 2022-07-06 DIAGNOSIS — Z1211 Encounter for screening for malignant neoplasm of colon: Secondary | ICD-10-CM

## 2022-07-06 DIAGNOSIS — M47816 Spondylosis without myelopathy or radiculopathy, lumbar region: Secondary | ICD-10-CM | POA: Diagnosis not present

## 2022-07-06 DIAGNOSIS — H53032 Strabismic amblyopia, left eye: Secondary | ICD-10-CM | POA: Diagnosis not present

## 2022-07-06 DIAGNOSIS — K5900 Constipation, unspecified: Secondary | ICD-10-CM | POA: Diagnosis not present

## 2022-07-06 DIAGNOSIS — Z0000 Encounter for general adult medical examination without abnormal findings: Secondary | ICD-10-CM

## 2022-07-06 DIAGNOSIS — I10 Essential (primary) hypertension: Secondary | ICD-10-CM | POA: Diagnosis not present

## 2022-07-06 DIAGNOSIS — F411 Generalized anxiety disorder: Secondary | ICD-10-CM | POA: Diagnosis not present

## 2022-07-06 MED ORDER — METHOCARBAMOL 750 MG PO TABS: 750.0000 mg | ORAL_TABLET | Freq: Three times a day (TID) | ORAL | 1 refills | Status: DC | PRN

## 2022-07-06 NOTE — Patient Instructions (Addendum)
Thank you for coming to the office today.  Labs today including cholesterol and A1c sugar.  Re ordered Methocarbamol 750mg  q 8 hr or 3 times a day for 90 pills  Keep in touch after you see [facility]  Consider the Movantik for opioid constipation.  -------------  No ear infection. R ear has very minimal ear wax. There is fluid and pressure behind ear drums.  Start nasal steroid Flonase 2 sprays in each nostril daily for 4-6 weeks, may repeat course seasonally or as needed  Back up plan is Azelastine - allergy spray if interested  Consider adding Claritin  ------------------  Ordered the Cologuard (home kit) test for colon cancer screening. Stay tuned for further updates.  It will be shipped to you directly. If not received in 2-4 weeks, call us or the company.   If you send it back and no results are received in 2-4 weeks, call us or the company as well!   Colon Cancer Screening: - For all adults age 78+ routine colon cancer screening is highly recommended.     - Recent guidelines from [place] recommend starting age of 57 - Early detection of colon cancer is important, because often there are no warning signs or symptoms, also if found early usually it can be cured. Late stage is hard to treat.   - If Cologuard is NEGATIVE, then it is good for 3 years before next due - If Cologuard is POSITIVE, then it is strongly advised to get a Colonoscopy, which allows the GI doctor to locate the source of the cancer or polyp (even very early stage) and treat it by removing it. ------------------------- Follow instructions to collect sample, you may call the company for any help or questions, 24/7 telephone support at [phone].   Please schedule a Follow-up Appointment to: Return in about 6 months (around 01/04/2023) for 6 month follow-up Updates from specialist.  If you have any other questions or concerns, please feel free to call the office or send a message  through [place]. You may also schedule an earlier appointment if necessary.  Additionally, you may be receiving a survey about your experience at our office within a few days to 1 week by e-mail or mail. We value your feedback.  Nobie Putnam, DO [place]

## 2022-07-06 NOTE — Progress Notes (Signed)
Subjective:    Patient ID: Suzanne Wilcox, female    DOB: [birth year], 47 y.o.   MRN: [id number]  Suzanne Wilcox is a 47 y.o. female presenting on 07/06/2022 for No chief complaint on file.   HPI  Here for Annual Physical and Labs., she had labs done at [facility] 07/03/22 as well in mychart / CareEverywhere  Lymphedema Followed by Vascular She acquired Lymphedema pump Off Furosemide Improving now  S/p gastric bypass approx 40-50 lbs  Goal to resume exercise regimen when ready.  She was referred to new Pain Management in [place], [facility]. Due to higher dose of Benzodiazpine and Pain Medication.  She had some issues of constipation and bowels post op after Gastric Bypass. She is taking Miralax regularly, Sennakot Some improved bowel movements now On Oxycodone up to 7.5mg  dose, previously on 5mg , has adjusted Concerns with Opioid Induced Constipation.  Following with Psychiatry  Health Maintenance: Cervical Cancer screening, due next year will review.  Screening for Colon CA - due today starting age 34+, ordered Cologuard.     05/28/2022    8:43 AM 12/19/2021   10:22 AM 11/28/2021    9:39 AM  Depression screen PHQ 2/9  Decreased Interest 3 2 2   Down, Depressed, Hopeless 1 1 1   PHQ - 2 Score 4 3 3   Altered sleeping 3 2 3   Tired, decreased energy 3 3 3   Change in appetite 2 3 3   Feeling bad or failure about yourself  2 2 2   Trouble concentrating 2 2 1   Moving slowly or fidgety/restless 0 0 0  Suicidal thoughts 0 0 0  PHQ-9 Score 16 15 15   Difficult doing work/chores Very difficult Very difficult Extremely dIfficult    Past Medical History:  Diagnosis Date   Allergy    Anxiety    Bipolar disorder ([place])    Bipolar disorder ([place])    Depression    Generalized headaches    GERD (gastroesophageal reflux disease)    Hx of varicella    Hyperlipidemia    Hypertension    Missed abortions    X 6   Obesity 04/29/2012   Postoperative retention of urine 03/28/2016    Postpartum care following cesarean delivery (9/29) 03/27/2016   Sleep apnea    Termination of pregnancy (fetus) 03/22/2004   X 1   Vaginal Pap smear, abnormal    Past Surgical History:  Procedure Laterality Date   CESAREAN SECTION  2011   X 1 [place] N/A 05/22/2014   Procedure: Repeat CESAREAN SECTION;  Surgeon: Lovenia Kim, MD;  Location: [facility];  Service: Obstetrics;  Laterality: N/A;  EDD: 05/31/14   CESAREAN SECTION WITH BILATERAL TUBAL LIGATION Bilateral 03/27/2016   Procedure: Repeat CESAREAN SECTION WITH BILATERAL TUBAL LIGATION;  Surgeon: Brien Few, MD;  Location: [place];  Service: Obstetrics;  Laterality: Bilateral;  EDD: 04/01/16   CHOLECYSTECTOMY N/A 04/29/2017   Procedure: LAPAROSCOPIC CHOLECYSTECTOMY WITH INTRAOPERATIVE CHOLANGIOGRAM;  Surgeon: Alphonsa Overall, MD;  Location: [facility];  Service: General;  Laterality: N/A;  ERAS PATHWAY   DILATION AND CURETTAGE OF UTERUS   2005, 2010     x2 MAB   ESOPHAGOGASTRODUODENOSCOPY N/A 01/11/2017   Procedure: ESOPHAGOGASTRODUODENOSCOPY (EGD);  Surgeon: Ronnette Juniper, MD;  Location: [facility];  Service: Gastroenterology;  Laterality: N/A;   IR GENERIC HISTORICAL  03/26/2016   IR FLUORO GUIDE CV LINE RIGHT 03/26/2016 [facility]   IR GENERIC HISTORICAL  03/26/2016   IR  US GUIDE VASC ACCESS RIGHT 03/26/2016 [facility]   WISDOM TOOTH EXTRACTION     Social History   Socioeconomic History   Marital status: Single    Spouse name: Not on file   Number of children: Not on file   Years of education: Not on file   Highest education level: Not on file  Occupational History   Not on file  Tobacco Use   Smoking status: Former    Packs/day: 1.00    Years: 18.00    Total pack years: 18.00    Types: Cigarettes    Quit date: 04/29/2009    Years since quitting: 13.1   Smokeless tobacco: Never  Vaping Use   Vaping Use: Never used  Substance and Sexual Activity   Alcohol use: Not Currently     Comment: OCCASION   Drug use: No   Sexual activity: Not on file  Other Topics Concern   Not on file  Social History Narrative   Right handed   One story home   4 children   Drinks caffeine 1-2 cups a day   Social Determinants of Health   Financial Resource Strain: Not on file  Food Insecurity: Not on file  Transportation Needs: Not on file  Physical Activity: Not on file  Stress: Not on file  Social Connections: Not on file  Intimate Partner Violence: Not on file   Family History  Problem Relation Age of Onset   Depression Mother    Skin cancer Mother    Pancreatic cancer Father    Kidney Stones Father    Stroke Father    Dementia Father    Skin cancer Sister    Depression Paternal Grandmother    Breast cancer Neg Hx    Current Outpatient Medications on File Prior to Visit  Medication Sig   amLODipine (NORVASC) 5 MG tablet Take 1 tablet (5 mg total) by mouth daily.   amphetamine-dextroamphetamine (ADDERALL) 30 MG tablet Take 30 mg by mouth daily.   aspirin-acetaminophen-caffeine (EXCEDRIN MIGRAINE) 250-250-65 MG tablet Take 2 tablets by mouth 2 (two) times daily as needed for headache.   buPROPion (WELLBUTRIN) 100 MG tablet Take 100 mg by mouth 2 (two) times daily. 2 in the morning and 1 in the afternoon   CAPLYTA 42 MG capsule Take 42 mg by mouth at bedtime.   ondansetron (ZOFRAN) 8 MG tablet Take 8 mg by mouth every 8 (eight) hours as needed for nausea or vomiting. Pt has not started medication yet   oxyCODONE-acetaminophen (PERCOCET) 7.5-325 MG tablet Take 2 tablets by mouth every 4 (four) hours as needed.   QUEtiapine (SEROQUEL) 100 MG tablet Take 100-200 mg by mouth at bedtime as needed.   No current facility-administered medications on file prior to visit.    Review of Systems  Constitutional:  Negative for activity change, appetite change, chills, diaphoresis, fatigue and fever.  HENT:  Negative for congestion and hearing loss.   Eyes:  Negative for visual  disturbance.  Respiratory:  Negative for cough, chest tightness, shortness of breath and wheezing.   Cardiovascular:  Negative for chest pain, palpitations and leg swelling.  Gastrointestinal:  Negative for abdominal pain, constipation, diarrhea, nausea and vomiting.  Genitourinary:  Negative for dysuria, frequency and hematuria.  Musculoskeletal:  Positive for back pain. Negative for arthralgias and neck pain.  Skin:  Negative for rash.  Neurological:  Negative for dizziness, weakness, light-headedness, numbness and headaches.  Hematological:  Negative for adenopathy.  Psychiatric/Behavioral:  Negative for behavioral  problems, dysphoric mood and sleep disturbance.    Per HPI unless specifically indicated above     Objective:    BP 132/88   Pulse 95   Ht 5\' 7"  (1.702 m)   Wt (!) 327 lb (148.3 kg)   SpO2 98%   BMI 51.22 kg/m   Wt Readings from Last 3 Encounters:  07/06/22 (!) 327 lb (148.3 kg)  05/28/22 (!) 348 lb (157.9 kg)  05/04/22 (!) 369 lb (167.4 kg)    Physical Exam Vitals and nursing note reviewed.  Constitutional:      General: She is not in acute distress.    Appearance: She is well-developed. She is obese. She is not diaphoretic.     Comments: Well-appearing, comfortable, cooperative  HENT:     Head: Normocephalic and atraumatic.  Eyes:     General:        Right eye: No discharge.        Left eye: No discharge.     Conjunctiva/sclera: Conjunctivae normal.     Pupils: Pupils are equal, round, and reactive to light.  Neck:     Thyroid: No thyromegaly.  Cardiovascular:     Rate and Rhythm: Normal rate and regular rhythm.     Pulses: Normal pulses.     Heart sounds: Normal heart sounds. No murmur heard. Pulmonary:     Effort: Pulmonary effort is normal. No respiratory distress.     Breath sounds: Normal breath sounds. No wheezing or rales.  Abdominal:     General: Bowel sounds are normal. There is no distension.     Palpations: Abdomen is soft. There is no  mass.     Tenderness: There is no abdominal tenderness.  Musculoskeletal:        General: No tenderness. Normal range of motion.     Cervical back: Normal range of motion and neck supple.     Comments: Upper / Lower Extremities: - Normal muscle tone, strength bilateral upper extremities 5/5, lower extremities 5/5  Lymphadenopathy:     Cervical: No cervical adenopathy.  Skin:    General: Skin is warm and dry.     Findings: No erythema or rash.  Neurological:     Mental Status: She is alert and oriented to person, place, and time.     Comments: Distal sensation intact to light touch all extremities  Psychiatric:        Mood and Affect: Mood normal.        Behavior: Behavior normal.        Thought Content: Thought content normal.     Comments: Well groomed, good eye contact, normal speech and thoughts    Results for orders placed or performed in visit on 01/19/22  Urine Culture   Specimen: Urine  Result Value Ref Range   MICRO NUMBER: [id number]    SPECIMEN QUALITY: Adequate    Sample Source URINE, CLEAN CATCH    STATUS: FINAL    Result: No Growth   POCT Urinalysis Dipstick  Result Value Ref Range   Color, UA Orange    Clarity, UA Cloudy    Glucose, UA Negative Negative   Bilirubin, UA Negative    Ketones, UA Negative    Spec Grav, UA 1.020 1.010 - 1.025   Blood, UA Moderate ++    pH, UA 5.0 5.0 - 8.0   Protein, UA Negative Negative   Urobilinogen, UA 0.2 0.2 or 1.0 E.U./dL   Nitrite, UA Negative    Leukocytes, UA Trace (A)  Negative   Appearance     Odor        Assessment & Plan:   Problem List Items Addressed This Visit     Essential hypertension   GAD (generalized anxiety disorder)   Relevant Medications   buPROPion (WELLBUTRIN) 100 MG tablet   Spondylosis of lumbar joint   Relevant Medications   oxyCODONE-acetaminophen (PERCOCET) 7.5-325 MG tablet   methocarbamol (ROBAXIN) 750 MG tablet   Other Visit Diagnoses     Annual physical exam    -  Primary    Relevant Orders   Hepatic function panel   Lipid panel   Hemoglobin A1c   History of Roux-en-Y gastric bypass       Relevant Orders   Hepatic function panel   Lipid panel   Hemoglobin A1c   Mixed hyperlipidemia       Relevant Orders   Lipid panel   Abnormal glucose       Relevant Orders   Hemoglobin A1c   Screening for colon cancer       Relevant Orders   Cologuard   Constipation, unspecified constipation type           Updated Health Maintenance information Reviewed recent lab results with patient from other provider few days ago Add other labs today to be drawn. Lipid A1c  Vascular Lymphedema Followed by Vascular specialist. On Lymphedema pump  Encouraged improvement to lifestyle with diet and exercise Goal of weight loss continued, s/p bariatric surgery  Lumbar DDD Chronic Back Pain Re ordered Methocarbamol 750mg  q 8 hr or 3 times a day for 90 pills  Opiate chronic pain management handled by pain specialist Update today she will be switched to [facility] in [place] She had been dose increased on pain medication previously and will continue to adjust accordingly by their guidelines. Secondary OIC - Consider the Movantik for opioid constipation.  Eustachian Tube Dysfunction No AOM infection Start nasal steroid Flonase 2 sprays in each nostril daily for 4-6 weeks, may repeat course seasonally or as needed Back up plan is Azelastine - allergy spray if interested Consider adding Claritin  ------------------  Ordered the Cologuard (home kit) test for colon cancer screening. Stay tuned for further updates.  Due for routine colon cancer screening. Never had colonoscopy (not interested), no family history colon cancer. - Discussion today about recommendations for either Colonoscopy or Cologuard screening, benefits and risks of screening, interested in Cologuard, understands that if positive then recommendation is for diagnostic colonoscopy to follow-up. - Ordered  Cologuard today  Orders Placed This Encounter  Procedures   Hepatic function panel   Lipid panel    Order Specific Question:   Has the patient fasted?    Answer:   Yes   Hemoglobin A1c   Cologuard      Meds ordered this encounter  Medications   methocarbamol (ROBAXIN) 750 MG tablet    Sig: Take 1 tablet (750 mg total) by mouth every 8 (eight) hours as needed for muscle spasms.    Dispense:  90 tablet    Refill:  1      Follow up plan: Return in about 6 months (around 01/04/2023) for 6 month follow-up Updates from specialist.  03/07/2023, DO [facility] 07/06/2022, 10:05 AM

## 2022-07-07 LAB — HEPATIC FUNCTION PANEL
AG Ratio: 1.6 (calc) (ref 1.0–2.5)
ALT: 18 U/L (ref 6–29)
AST: 18 U/L (ref 10–35)
Albumin: 3.9 g/dL (ref 3.6–5.1)
Alkaline phosphatase (APISO): 63 U/L (ref 31–125)
Bilirubin, Direct: 0.1 mg/dL (ref 0.0–0.2)
Globulin: 2.4 g/dL (calc) (ref 1.9–3.7)
Indirect Bilirubin: 0.3 mg/dL (calc) (ref 0.2–1.2)
Total Bilirubin: 0.4 mg/dL (ref 0.2–1.2)
Total Protein: 6.3 g/dL (ref 6.1–8.1)

## 2022-07-07 LAB — HEMOGLOBIN A1C
Hgb A1c MFr Bld: 5.5 % of total Hgb (ref <5.7)
Mean Plasma Glucose: 111 mg/dL
eAG (mmol/L): 6.2 mmol/L

## 2022-07-07 LAB — LIPID PANEL
Cholesterol: 169 mg/dL (ref <200)
HDL: 54 mg/dL (ref >=50)
LDL Cholesterol (Calc): 97 mg/dL (calc)
Non-HDL Cholesterol (Calc): 115 mg/dL (calc) (ref <130)
Total CHOL/HDL Ratio: 3.1 (calc) (ref <5.0)
Triglycerides: 89 mg/dL (ref <150)

## 2022-07-17 DIAGNOSIS — Z1211 Encounter for screening for malignant neoplasm of colon: Secondary | ICD-10-CM | POA: Diagnosis not present

## 2022-07-29 DIAGNOSIS — K912 Postsurgical malabsorption, not elsewhere classified: Secondary | ICD-10-CM | POA: Diagnosis not present

## 2022-07-29 DIAGNOSIS — Z713 Dietary counseling and surveillance: Secondary | ICD-10-CM | POA: Diagnosis not present

## 2022-07-29 DIAGNOSIS — Z9884 Bariatric surgery status: Secondary | ICD-10-CM | POA: Diagnosis not present

## 2022-07-31 ENCOUNTER — Ambulatory Visit (INDEPENDENT_AMBULATORY_CARE_PROVIDER_SITE_OTHER): Payer: 59

## 2022-07-31 VITALS — Ht 67.0 in | Wt 327.0 lb

## 2022-07-31 DIAGNOSIS — Z0000 Encounter for general adult medical examination without abnormal findings: Secondary | ICD-10-CM | POA: Diagnosis not present

## 2022-07-31 NOTE — Patient Instructions (Signed)
Ms. Suzanne Wilcox , Thank you for taking time to come for your Medicare Wellness Visit. I appreciate your ongoing commitment to your health goals. Please review the following plan we discussed and let me know if I can assist you in the future.   These are the goals we discussed:  Goals      DIET - EAT MORE FRUITS AND VEGETABLES        This is a list of the screening recommended for you and due dates:  Health Maintenance  Topic Date Due   COVID-19 Vaccine (1) Never done   Hepatitis C Screening: USPSTF Recommendation to screen - Ages 27-79 yo.  Never done   Pap Smear  Never done   Cologuard (Stool DNA test)  Never done   Medicare Annual Wellness Visit  08/01/2023   DTaP/Tdap/Td vaccine (2 - Td or Tdap) 04/17/2024   Flu Shot  Completed   HIV Screening  Completed   HPV Vaccine  Aged Out    Advanced directives: no  Conditions/risks identified: none  Next appointment: Follow up in one year for your annual wellness visit. 08/06/23 @ 8:45 am by phone  Preventive Care 40-64 Years, Female Preventive care refers to lifestyle choices and visits with your health care provider that can promote health and wellness. What does preventive care include? A yearly physical exam. This is also called an annual well check. Dental exams once or twice a year. Routine eye exams. Ask your health care provider how often you should have your eyes checked. Personal lifestyle choices, including: Daily care of your teeth and gums. Regular physical activity. Eating a healthy diet. Avoiding tobacco and drug use. Limiting alcohol use. Practicing safe sex. Taking low-dose aspirin daily starting at age 57. Taking vitamin and mineral supplements as recommended by your health care provider. What happens during an annual well check? The services and screenings done by your health care provider during your annual well check will depend on your age, overall health, lifestyle risk factors, and family history of  disease. Counseling  Your health care provider may ask you questions about your: Alcohol use. Tobacco use. Drug use. Emotional well-being. Home and relationship well-being. Sexual activity. Eating habits. Work and work Statistician. Method of birth control. Menstrual cycle. Pregnancy history. Screening  You may have the following tests or measurements: Height, weight, and BMI. Blood pressure. Lipid and cholesterol levels. These may be checked every 5 years, or more frequently if you are over 76 years old. Skin check. Lung cancer screening. You may have this screening every year starting at age 42 if you have a 30-pack-year history of smoking and currently smoke or have quit within the past 15 years. Fecal occult blood test (FOBT) of the stool. You may have this test every year starting at age 110. Flexible sigmoidoscopy or colonoscopy. You may have a sigmoidoscopy every 5 years or a colonoscopy every 10 years starting at age 71. Hepatitis C blood test. Hepatitis B blood test. Sexually transmitted disease (STD) testing. Diabetes screening. This is done by checking your blood sugar (glucose) after you have not eaten for a while (fasting). You may have this done every 1-3 years. Mammogram. This may be done every 1-2 years. Talk to your health care provider about when you should start having regular mammograms. This may depend on whether you have a family history of breast cancer. BRCA-related cancer screening. This may be done if you have a family history of breast, ovarian, tubal, or peritoneal cancers. Pelvic exam  and Pap test. This may be done every 3 years starting at age 46. Starting at age 3, this may be done every 5 years if you have a Pap test in combination with an HPV test. Bone density scan. This is done to screen for osteoporosis. You may have this scan if you are at high risk for osteoporosis. Discuss your test results, treatment options, and if necessary, the need for more  tests with your health care provider. Vaccines  Your health care provider may recommend certain vaccines, such as: Influenza vaccine. This is recommended every year. Tetanus, diphtheria, and acellular pertussis (Tdap, Td) vaccine. You may need a Td booster every 10 years. Zoster vaccine. You may need this after age 74. Pneumococcal 13-valent conjugate (PCV13) vaccine. You may need this if you have certain conditions and were not previously vaccinated. Pneumococcal polysaccharide (PPSV23) vaccine. You may need one or two doses if you smoke cigarettes or if you have certain conditions. Talk to your health care provider about which screenings and vaccines you need and how often you need them. This information is not intended to replace advice given to you by your health care provider. Make sure you discuss any questions you have with your health care provider. Document Released: 07/12/2015 Document Revised: 03/04/2016 Document Reviewed: 04/16/2015 Elsevier Interactive Patient Education  2017 [place] Prevention in the Home Falls can cause injuries. They can happen to people of all ages. There are many things you can do to make your home safe and to help prevent falls. What can I do on the outside of my home? Regularly fix the edges of walkways and driveways and fix any cracks. Remove anything that might make you trip as you walk through a door, such as a raised step or threshold. Trim any bushes or trees on the path to your home. Use bright outdoor lighting. Clear any walking paths of anything that might make someone trip, such as rocks or tools. Regularly check to see if handrails are loose or broken. Make sure that both sides of any steps have handrails. Any raised decks and porches should have guardrails on the edges. Have any leaves, snow, or ice cleared regularly. Use sand or salt on walking paths during winter. Clean up any spills in your garage right away. This includes  oil or grease spills. What can I do in the bathroom? Use night lights. Install grab bars by the toilet and in the tub and shower. Do not use towel bars as grab bars. Use non-skid mats or decals in the tub or shower. If you need to sit down in the shower, use a plastic, non-slip stool. Keep the floor dry. Clean up any water that spills on the floor as soon as it happens. Remove soap buildup in the tub or shower regularly. Attach bath mats securely with double-sided non-slip rug tape. Do not have throw rugs and other things on the floor that can make you trip. What can I do in the bedroom? Use night lights. Make sure that you have a light by your bed that is easy to reach. Do not use any sheets or blankets that are too big for your bed. They should not hang down onto the floor. Have a firm chair that has side arms. You can use this for support while you get dressed. Do not have throw rugs and other things on the floor that can make you trip. What can I do in the kitchen? Clean up  any spills right away. Avoid walking on wet floors. Keep items that you use a lot in easy-to-reach places. If you need to reach something above you, use a strong step stool that has a grab bar. Keep electrical cords out of the way. Do not use floor polish or wax that makes floors slippery. If you must use wax, use non-skid floor wax. Do not have throw rugs and other things on the floor that can make you trip. What can I do with my stairs? Do not leave any items on the stairs. Make sure that there are handrails on both sides of the stairs and use them. Fix handrails that are broken or loose. Make sure that handrails are as long as the stairways. Check any carpeting to make sure that it is firmly attached to the stairs. Fix any carpet that is loose or worn. Avoid having throw rugs at the top or bottom of the stairs. If you do have throw rugs, attach them to the floor with carpet tape. Make sure that you have a light  switch at the top of the stairs and the bottom of the stairs. If you do not have them, ask someone to add them for you. What else can I do to help prevent falls? Wear shoes that: Do not have high heels. Have rubber bottoms. Are comfortable and fit you well. Are closed at the toe. Do not wear sandals. If you use a stepladder: Make sure that it is fully opened. Do not climb a closed stepladder. Make sure that both sides of the stepladder are locked into place. Ask someone to hold it for you, if possible. Clearly mark and make sure that you can see: Any grab bars or handrails. First and last steps. Where the edge of each step is. Use tools that help you move around (mobility aids) if they are needed. These include: Canes. Walkers. Scooters. Crutches. Turn on the lights when you go into a dark area. Replace any light bulbs as soon as they burn out. Set up your furniture so you have a clear path. Avoid moving your furniture around. If any of your floors are uneven, fix them. If there are any pets around you, be aware of where they are. Review your medicines with your doctor. Some medicines can make you feel dizzy. This can increase your chance of falling. Ask your doctor what other things that you can do to help prevent falls. This information is not intended to replace advice given to you by your health care provider. Make sure you discuss any questions you have with your health care provider. Document Released: 04/11/2009 Document Revised: 11/21/2015 Document Reviewed: 07/20/2014 Elsevier Interactive Patient Education  2017 [employer].

## 2022-07-31 NOTE — Progress Notes (Signed)
Virtual Visit via Telephone Note  I connected with  Suzanne Wilcox on 07/31/22 at  8:45 AM EST by telephone and verified that I am speaking with the correct person using two identifiers.  Location: Patient: home Provider: [facility] Persons participating in the virtual visit: [place]   I discussed the limitations, risks, security and privacy concerns of performing an evaluation and management service by telephone and the availability of in person appointments. The patient expressed understanding and agreed to proceed.  Interactive audio and video telecommunications were attempted between this nurse and patient, however failed, due to patient having technical difficulties OR patient did not have access to video capability.  We continued and completed visit with audio only.  Some vital signs may be absent or patient reported.   Dionisio David, LPN  Subjective:   Suzanne Wilcox is a 47 y.o. female who presents for Medicare Annual (Subsequent) preventive examination.  Review of Systems     Cardiac Risk Factors include: advanced age (>39men, >31 women);hypertension     Objective:    There were no vitals filed for this visit. There is no height or weight on file to calculate BMI.     07/31/2022    8:48 AM 10/17/2021   11:12 AM 12/16/2020    8:28 PM 08/18/2019    8:16 AM 03/24/2019    4:26 PM 07/26/2018   11:46 AM 04/28/2017    8:15 AM  Advanced Directives  Does Patient Have a Medical Advance Directive? No No No No No No No  Does patient want to make changes to medical advance directive? No - Patient declined        Would patient like information on creating a medical advance directive? No - Patient declined No - Patient declined Yes (MAU/Ambulatory/Procedural Areas - Information given) Yes (MAU/Ambulatory/Procedural Areas - Information given)  No - Patient declined Yes (MAU/Ambulatory/Procedural Areas - Information given)    Current Medications (verified) Outpatient  Encounter Medications as of 07/31/2022  Medication Sig   ALPRAZolam (XANAX) 1 MG tablet Take 1 mg by mouth 2 (two) times daily as needed for anxiety.   amLODipine (NORVASC) 5 MG tablet Take 1 tablet (5 mg total) by mouth daily.   amphetamine-dextroamphetamine (ADDERALL) 30 MG tablet Take 30 mg by mouth daily.   aspirin-acetaminophen-caffeine (EXCEDRIN MIGRAINE) 250-250-65 MG tablet Take 2 tablets by mouth 2 (two) times daily as needed for headache.   buPROPion (WELLBUTRIN) 100 MG tablet Take 100 mg by mouth 2 (two) times daily. 2 in the morning and 1 in the afternoon   CAPLYTA 42 MG capsule Take 42 mg by mouth at bedtime.   methocarbamol (ROBAXIN) 750 MG tablet Take 1 tablet (750 mg total) by mouth every 8 (eight) hours as needed for muscle spasms.   ondansetron (ZOFRAN) 8 MG tablet Take 8 mg by mouth every 8 (eight) hours as needed for nausea or vomiting. Pt has not started medication yet   QUEtiapine (SEROQUEL) 100 MG tablet Take 100-200 mg by mouth at bedtime as needed.   No facility-administered encounter medications on file as of 07/31/2022.    Allergies (verified) Adhesive [tape] and Lyrica [pregabalin]   History: Past Medical History:  Diagnosis Date   Allergy    Anxiety    Bipolar disorder ([place])    Bipolar disorder ([place])    Depression    Generalized headaches    GERD (gastroesophageal reflux disease)    Hx of varicella    Hyperlipidemia    Hypertension  Missed abortions    X 6   Obesity 04/29/2012   Postoperative retention of urine 03/28/2016   Postpartum care following cesarean delivery (9/29) 03/27/2016   Sleep apnea    Termination of pregnancy (fetus) 03/22/2004   X 1   Vaginal Pap smear, abnormal    Past Surgical History:  Procedure Laterality Date   CESAREAN SECTION  2011   X 1 [place] N/A 05/22/2014   Procedure: Repeat CESAREAN SECTION;  Surgeon: Lovenia Kim, MD;  Location: [facility];  Service: Obstetrics;  Laterality: N/A;  EDD:  05/31/14   CESAREAN SECTION WITH BILATERAL TUBAL LIGATION Bilateral 03/27/2016   Procedure: Repeat CESAREAN SECTION WITH BILATERAL TUBAL LIGATION;  Surgeon: Brien Few, MD;  Location: [place] [facility];  Service: Obstetrics;  Laterality: Bilateral;  EDD: 04/01/16   CHOLECYSTECTOMY N/A 04/29/2017   Procedure: LAPAROSCOPIC CHOLECYSTECTOMY WITH INTRAOPERATIVE CHOLANGIOGRAM;  Surgeon: Alphonsa Overall, MD;  Location: [facility];  Service: General;  Laterality: N/A;  ERAS PATHWAY   DILATION AND CURETTAGE OF UTERUS   2005, 2010     x2 MAB   ESOPHAGOGASTRODUODENOSCOPY N/A 01/11/2017   Procedure: ESOPHAGOGASTRODUODENOSCOPY (EGD);  Surgeon: Ronnette Juniper, MD;  Location: [facility];  Service: Gastroenterology;  Laterality: N/A;   IR GENERIC HISTORICAL  03/26/2016   IR FLUORO GUIDE CV LINE RIGHT 03/26/2016 [facility]   IR GENERIC HISTORICAL  03/26/2016   IR US GUIDE VASC ACCESS RIGHT 03/26/2016 [facility]   WISDOM TOOTH EXTRACTION     Family History  Problem Relation Age of Onset   Depression Mother    Skin cancer Mother    Pancreatic cancer Father    Kidney Stones Father    Stroke Father    Dementia Father    Skin cancer Sister    Depression Paternal Grandmother    Breast cancer Neg Hx    Social History   Socioeconomic History   Marital status: Single    Spouse name: Not on file   Number of children: Not on file   Years of education: Not on file   Highest education level: Not on file  Occupational History   Not on file  Tobacco Use   Smoking status: Former    Packs/day: 1.00    Years: 18.00    Total pack years: 18.00    Types: Cigarettes    Quit date: 04/29/2009    Years since quitting: 13.2   Smokeless tobacco: Never  Vaping Use   Vaping Use: Never used  Substance and Sexual Activity   Alcohol use: Not Currently    Comment: OCCASION   Drug use: No   Sexual activity: Not on file  Other Topics Concern   Not on file  Social History Narrative   Right handed   One story home   4  children   Drinks caffeine 1-2 cups a day   Social Determinants of Health   Financial Resource Strain: Low Risk  (07/31/2022)   Overall Financial Resource Strain (CARDIA)    Difficulty of Paying Living Expenses: Not hard at all  Food Insecurity: No Food Insecurity (07/31/2022)   Hunger Vital Sign    Worried About Running Out of Food in the Last Year: Never true    Ran Out of Food in the Last Year: Never true  Transportation Needs: No Transportation Needs (07/31/2022)   PRAPARE - Hydrologist (Medical): No    Lack of Transportation (Non-Medical): No  Physical Activity: Insufficiently  Active (07/31/2022)   Exercise Vital Sign    Days of Exercise per Week: 2 days    Minutes of Exercise per Session: 20 min  Stress: No Stress Concern Present (07/31/2022)   [employer] of Occupational Health - Occupational Stress Questionnaire    Feeling of Stress : Only a little  Social Connections: Socially Isolated (07/31/2022)   Social Connection and Isolation Panel [NHANES]    Frequency of Communication with Friends and Family: Three times a week    Frequency of Social Gatherings with Friends and Family: Once a week    Attends Religious Services: Never    Database administrator or Organizations: No    Attends Engineer, structural: Never    Marital Status: Divorced    Tobacco Counseling Counseling given: Not Answered   Clinical Intake:  Pre-visit preparation completed: Yes  Pain : No/denies pain     Nutritional Risks: None Diabetes: No  How often do you need to have someone help you when you read instructions, pamphlets, or other written materials from your doctor or pharmacy?: 1 - Never  Diabetic?no  Interpreter Needed?: No  Information entered by :: Kennedy Bucker, LPN   Activities of Daily Living    07/31/2022    8:49 AM 07/27/2022    9:49 AM  In your present state of health, do you have any difficulty performing the following activities:   Hearing? 0 0  Vision? 0 0  Difficulty concentrating or making decisions? 0 0  Walking or climbing stairs? 0 0  Dressing or bathing? 0 0  Doing errands, shopping? 0 0  Preparing Food and eating ? N N  Using the Toilet? N N  In the past six months, have you accidently leaked urine? Y Y  Do you have problems with loss of bowel control? N N  Managing your Medications? N N  Managing your Finances? N N  Housekeeping or managing your Housekeeping? Malvin Johns    Patient Care Team: Smitty Cords, DO as PCP - General (Family Medicine) Drema Dallas, DO as Consulting Physician (Neurology)  Indicate any recent Medical Services you may have received from other than [facility] providers in the past year (date may be approximate).     Assessment:   This is a routine wellness examination for [place].  Hearing/Vision screen Hearing Screening - Comments:: No aids Vision Screening - Comments:: Wears glasses- Dr.Woodard  Dietary issues and exercise activities discussed: Current Exercise Habits: Home exercise routine, Type of exercise: walking, Time (Minutes): 20, Frequency (Times/Week): 2, Weekly Exercise (Minutes/Week): 40, Intensity: Mild   Goals Addressed             This Visit's Progress    DIET - EAT MORE FRUITS AND VEGETABLES         Depression Screen    07/31/2022    8:46 AM 05/28/2022    8:43 AM 12/19/2021   10:22 AM 11/28/2021    9:39 AM 10/29/2021   10:35 AM 01/10/2014    9:49 AM  PHQ 2/9 Scores  PHQ - 2 Score 0 4 3 3 2  0  PHQ- 9 Score 0 16 15 15 16      Fall Risk    07/31/2022    8:49 AM 07/27/2022    9:49 AM 05/28/2022    8:43 AM 12/19/2021   10:22 AM 11/28/2021    9:39 AM  Fall Risk   Falls in the past year? 1 1 0 0 0  Number falls in past  yr: 0 0 0 0 0  Injury with Fall? 0 0 0 0 0  Risk for fall due to : History of fall(s)  No Fall Risks No Fall Risks No Fall Risks  Follow up Falls evaluation completed;Falls prevention discussed  Falls evaluation completed Falls  evaluation completed Falls evaluation completed    FALL RISK PREVENTION PERTAINING TO THE HOME:  Any stairs in or around the home? Yes  If so, are there any without handrails? No  Home free of loose throw rugs in walkways, pet beds, electrical cords, etc? Yes  Adequate lighting in your home to reduce risk of falls? Yes   ASSISTIVE DEVICES UTILIZED TO PREVENT FALLS:  Life alert? No  Use of a cane, walker or w/c? No  Grab bars in the bathroom? No  Shower chair or bench in shower? Yes  Elevated toilet seat or a handicapped toilet? Yes    Cognitive Function:        07/31/2022    8:54 AM  6CIT Screen  What Year? 0 points  What month? 0 points  What time? 0 points  Count back from 20 0 points  Months in reverse 0 points  Repeat phrase 0 points  Total Score 0 points    Immunizations Immunization History  Administered Date(s) Administered   Influenza,inj,Quad PF,6+ Mos 06/14/2017, 05/02/2022   Influenza-Unspecified 04/17/2014, 04/21/2019   Tdap 04/17/2014    TDAP status: Up to date  Flu Vaccine status: Up to date  Pneumococcal vaccine status: Declined,  Education has been provided regarding the importance of this vaccine but patient still declined. Advised may receive this vaccine at local pharmacy or Health Dept. Aware to provide a copy of the vaccination record if obtained from local pharmacy or Health Dept. Verbalized acceptance and understanding.   Covid-19 vaccine status: Declined, Education has been provided regarding the importance of this vaccine but patient still declined. Advised may receive this vaccine at local pharmacy or Health Dept.or vaccine clinic. Aware to provide a copy of the vaccination record if obtained from local pharmacy or Health Dept. Verbalized acceptance and understanding.  Qualifies for Shingles Vaccine? No   Zostavax completed No   Shingrix Completed?: No.    Education has been provided regarding the importance of this vaccine. Patient has  been advised to call insurance company to determine out of pocket expense if they have not yet received this vaccine. Advised may also receive vaccine at local pharmacy or Health Dept. Verbalized acceptance and understanding.  Screening Tests Health Maintenance  Topic Date Due   COVID-19 Vaccine (1) Never done   Hepatitis C Screening  Never done   PAP SMEAR-Modifier  Never done   Fecal DNA (Cologuard)  Never done   Medicare Annual Wellness (AWV)  08/01/2023   DTaP/Tdap/Td (2 - Td or Tdap) 04/17/2024   INFLUENZA VACCINE  Completed   HIV Screening  Completed   HPV VACCINES  Aged Out    Health Maintenance  Health Maintenance Due  Topic Date Due   COVID-19 Vaccine (1) Never done   Hepatitis C Screening  Never done   PAP SMEAR-Modifier  Never done   Fecal DNA (Cologuard)  Never done    Colorectal cancer screening: Type of screening: Cologuard. Completed recently. Repeat every 3 years- sent 2 weeks ago, awaiting results  Mammogram status: Completed 01/15/22. Repeat every year   Lung Cancer Screening: (Low Dose CT Chest recommended if Age 52-80 years, 30 pack-year currently smoking OR have quit w/in 15years.) does not  qualify.   Lung Cancer Screening Referral: had CT scan while in hospital in November  Additional Screening:  Hepatitis C Screening: does qualify; Completed no  Vision Screening: Recommended annual ophthalmology exams for early detection of glaucoma and other disorders of the eye. Is the patient up to date with their annual eye exam?  Yes  Who is the provider or what is the name of the office in which the patient attends annual eye exams? Dr.Woodard If pt is not established with a provider, would they like to be referred to a provider to establish care? No .   Dental Screening: Recommended annual dental exams for proper oral hygiene  Community Resource Referral / Chronic Care Management: CRR required this visit?  No   CCM required this visit?  No      Plan:      I have personally reviewed and noted the following in the patient's chart:   Medical and social history Use of alcohol, tobacco or illicit drugs  Current medications and supplements including opioid prescriptions. Patient is not currently taking opioid prescriptions. Functional ability and status Nutritional status Physical activity Advanced directives List of other physicians Hospitalizations, surgeries, and ER visits in previous 12 months Vitals Screenings to include cognitive, depression, and falls Referrals and appointments  In addition, I have reviewed and discussed with patient certain preventive protocols, quality metrics, and best practice recommendations. A written personalized care plan for preventive services as well as general preventive health recommendations were provided to patient.     Hal Hope, LPN   4/[id number]/3474   Nurse Notes: none

## 2022-08-02 LAB — COLOGUARD: COLOGUARD: POSITIVE — AB

## 2022-08-04 ENCOUNTER — Encounter: Payer: Self-pay | Admitting: Family Medicine

## 2022-08-04 DIAGNOSIS — R195 Other fecal abnormalities: Secondary | ICD-10-CM

## 2022-08-06 ENCOUNTER — Telehealth: Payer: Self-pay

## 2022-08-06 ENCOUNTER — Other Ambulatory Visit: Payer: Self-pay

## 2022-08-06 DIAGNOSIS — R195 Other fecal abnormalities: Secondary | ICD-10-CM

## 2022-08-06 DIAGNOSIS — Z1211 Encounter for screening for malignant neoplasm of colon: Secondary | ICD-10-CM

## 2022-08-06 MED ORDER — NA SULFATE-K SULFATE-MG SULF 17.5-3.13-1.6 GM/177ML PO SOLN: 1.0000 | Freq: Once | ORAL | 0 refills | Status: AC

## 2022-08-06 NOTE — Telephone Encounter (Signed)
Gastroenterology Pre-Procedure Review  Request Date: 09/04/22 Requesting Physician: Dr. 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  She has requested not to have a breathing tube during her procedure because she acquired aspiration pneumonia in the past as a result of this.    1. Are you having any GI issues? Patients 1st colonoscopy, she has a positive colorectal screening 2. Do you have a personal history of Polyps? no 3. Do you have a family history of Colon Cancer or Polyps? no 4. Diabetes Mellitus? no 5. Joint replacements in the past 12 months?no 6. Major health problems in the past 3 months?no 7. Any artificial heart valves, MVP, or defibrillator?no    MEDICATIONS & ALLERGIES:    Patient reports the following regarding taking any anticoagulation/antiplatelet therapy:   Plavix, Coumadin, Eliquis, Xarelto, Lovenox, Pradaxa, Brilinta, or Effient? no Aspirin? no  Patient confirms/reports the following medications:  Current Outpatient Medications  Medication Sig Dispense Refill   ALPRAZolam (XANAX) 1 MG tablet Take 1 mg by mouth 2 (two) times daily as needed for anxiety.     amLODipine (NORVASC) 5 MG tablet Take 1 tablet (5 mg total) by mouth daily. 90 tablet 1   amphetamine-dextroamphetamine (ADDERALL) 30 MG tablet Take 30 mg by mouth daily.     aspirin-acetaminophen-caffeine (EXCEDRIN MIGRAINE) 250-250-65 MG tablet Take 2 tablets by mouth 2 (two) times daily as needed for headache.     buPROPion (WELLBUTRIN) 100 MG tablet Take 100 mg by mouth 2 (two) times daily. 2 in the morning and 1 in the afternoon     CAPLYTA 42 MG capsule Take 42 mg by mouth at bedtime.     methocarbamol (ROBAXIN) 750 MG tablet Take 1 tablet (750 mg total) by mouth every 8 (eight) hours as needed for muscle spasms. 90 tablet 1   ondansetron (ZOFRAN) 8 MG tablet Take 8 mg by mouth every 8 (eight) hours as needed  for nausea or vomiting. Pt has not started medication yet     QUEtiapine (SEROQUEL) 100 MG tablet Take 100-200 mg by mouth at bedtime as needed.     No current facility-administered medications for this visit.    Patient confirms/reports the following allergies:  Allergies  Allergen Reactions   Adhesive [Tape] Rash   Lyrica [Pregabalin] Photosensitivity    No orders of the defined types were placed in this encounter.   AUTHORIZATION INFORMATION Primary Insurance: 1D#: Group #:  Secondary Insurance: 1D#: Group #:  SCHEDULE INFORMATION: Date: 09/04/22 Time: Location: [facility]

## 2022-08-14 ENCOUNTER — Encounter (INDEPENDENT_AMBULATORY_CARE_PROVIDER_SITE_OTHER): Payer: Self-pay | Admitting: Nurse Practitioner

## 2022-08-14 ENCOUNTER — Ambulatory Visit (INDEPENDENT_AMBULATORY_CARE_PROVIDER_SITE_OTHER): Payer: 59 | Admitting: Nurse Practitioner

## 2022-08-14 VITALS — BP 145/97 | HR 83 | Resp 16 | Wt 317.4 lb

## 2022-08-14 DIAGNOSIS — I8312 Varicose veins of left lower extremity with inflammation: Secondary | ICD-10-CM

## 2022-08-14 DIAGNOSIS — I8311 Varicose veins of right lower extremity with inflammation: Secondary | ICD-10-CM | POA: Diagnosis not present

## 2022-08-14 DIAGNOSIS — I890 Lymphedema, not elsewhere classified: Secondary | ICD-10-CM | POA: Diagnosis not present

## 2022-08-14 DIAGNOSIS — I10 Essential (primary) hypertension: Secondary | ICD-10-CM | POA: Diagnosis not present

## 2022-08-15 ENCOUNTER — Encounter (INDEPENDENT_AMBULATORY_CARE_PROVIDER_SITE_OTHER): Payer: Self-pay | Admitting: Nurse Practitioner

## 2022-08-15 NOTE — Progress Notes (Signed)
Subjective:    Patient ID: Suzanne Wilcox, female    DOB: [birth year], 47 y.o.   MRN: [id number] Chief Complaint  Patient presents with   Follow-up    6 month follow up    Suzanne Wilcox is a 47 year old female who presents today for follow-up regarding her lymphedema and venous insufficiency.  She recently had gastric bypass surgery has lost about 60 pounds since her last visit.  She has not the swelling is under much better control.  She does have some occasional swelling but it is largely controlled with use of elevation and her lymphedema pump.  She also notes that she has not been having any extensive pain from her varicosities.  No large varicosities have erupted.  No issues with bleeding.    Review of Systems  Cardiovascular:  Positive for leg swelling.  All other systems reviewed and are negative.      Objective:   Physical Exam Vitals reviewed.  HENT:     Head: Normocephalic.  Cardiovascular:     Rate and Rhythm: Normal rate.  Pulmonary:     Effort: Pulmonary effort is normal.  Skin:    General: Skin is warm and dry.  Neurological:     Mental Status: She is alert and oriented to person, place, and time.  Psychiatric:        Mood and Affect: Mood normal.        Behavior: Behavior normal.        Thought Content: Thought content normal.        Judgment: Judgment normal.     BP (!) 145/97 (BP Location: Left Arm)   Pulse 83   Resp 16   Wt (!) 317 lb 6.4 oz (144 kg)   BMI 49.71 kg/m   Past Medical History:  Diagnosis Date   Allergy    Anxiety    Bipolar disorder (HCC)    Bipolar disorder (HCC)    Depression    Generalized headaches    GERD (gastroesophageal reflux disease)    Hx of varicella    Hyperlipidemia    Hypertension    Missed abortions    X 6   Obesity 04/29/2012   Postoperative retention of urine 03/28/2016   Postpartum care following cesarean delivery (9/29) 03/27/2016   Sleep apnea    Termination of pregnancy (fetus) 03/22/2004   X 1    Vaginal Pap smear, abnormal     Social History   Socioeconomic History   Marital status: Single    Spouse name: Not on file   Number of children: Not on file   Years of education: Not on file   Highest education level: Not on file  Occupational History   Not on file  Tobacco Use   Smoking status: Former    Packs/day: 1.00    Years: 18.00    Total pack years: 18.00    Types: Cigarettes    Quit date: 04/29/2009    Years since quitting: 13.3   Smokeless tobacco: Never  Vaping Use   Vaping Use: Never used  Substance and Sexual Activity   Alcohol use: Not Currently    Comment: OCCASION   Drug use: No   Sexual activity: Not on file  Other Topics Concern   Not on file  Social History Narrative   Right handed   One story home   4 children   Drinks caffeine 1-2 cups a day   Social Determinants of Health   Financial Resource Strain: Low  Risk  (07/31/2022)   Overall Financial Resource Strain (CARDIA)    Difficulty of Paying Living Expenses: Not hard at all  Food Insecurity: No Food Insecurity (07/31/2022)   Hunger Vital Sign    Worried About Running Out of Food in the Last Year: Never true    Ran Out of Food in the Last Year: Never true  Transportation Needs: No Transportation Needs (07/31/2022)   PRAPARE - Hydrologist (Medical): No    Lack of Transportation (Non-Medical): No  Physical Activity: Insufficiently Active (07/31/2022)   Exercise Vital Sign    Days of Exercise per Week: 2 days    Minutes of Exercise per Session: 20 min  Stress: No Stress Concern Present (07/31/2022)   [place]    Feeling of Stress : Only a little  Social Connections: Socially Isolated (07/31/2022)   Social Connection and Isolation Panel [NHANES]    Frequency of Communication with Friends and Family: Three times a week    Frequency of Social Gatherings with Friends and Family: Once a week    Attends  Religious Services: Never    Marine scientist or Organizations: No    Attends Archivist Meetings: Never    Marital Status: Divorced  Human resources officer Violence: Not At Risk (07/31/2022)   Humiliation, Afraid, Rape, and Kick questionnaire    Fear of Current or Ex-Partner: No    Emotionally Abused: No    Physically Abused: No    Sexually Abused: No    Past Surgical History:  Procedure Laterality Date   CESAREAN SECTION  2011   X 1 [place] N/A 05/22/2014   Procedure: Repeat CESAREAN SECTION;  Surgeon: Lovenia Kim, MD;  Location: [facility];  Service: Obstetrics;  Laterality: N/A;  EDD: 05/31/14   CESAREAN SECTION WITH BILATERAL TUBAL LIGATION Bilateral 03/27/2016   Procedure: Repeat CESAREAN SECTION WITH BILATERAL TUBAL LIGATION;  Surgeon: Brien Few, MD;  Location: [facility];  Service: Obstetrics;  Laterality: Bilateral;  EDD: 04/01/16   CHOLECYSTECTOMY N/A 04/29/2017   Procedure: LAPAROSCOPIC CHOLECYSTECTOMY WITH INTRAOPERATIVE CHOLANGIOGRAM;  Surgeon: Alphonsa Overall, MD;  Location: [facility];  Service: General;  Laterality: N/A;  ERAS PATHWAY   DILATION AND CURETTAGE OF UTERUS   2005, 2010     x2 MAB   ESOPHAGOGASTRODUODENOSCOPY N/A 01/11/2017   Procedure: ESOPHAGOGASTRODUODENOSCOPY (EGD);  Surgeon: Ronnette Juniper, MD;  Location: [facility];  Service: Gastroenterology;  Laterality: N/A;   IR GENERIC HISTORICAL  03/26/2016   IR FLUORO GUIDE CV LINE RIGHT 03/26/2016 [facility]   IR GENERIC HISTORICAL  03/26/2016   IR US GUIDE VASC ACCESS RIGHT 03/26/2016 [facility]   WISDOM TOOTH EXTRACTION      Family History  Problem Relation Age of Onset   Depression Mother    Skin cancer Mother    Pancreatic cancer Father    Kidney Stones Father    Stroke Father    Dementia Father    Skin cancer Sister    Depression Paternal Grandmother    Breast cancer Neg Hx     Allergies  Allergen Reactions   Adhesive [Tape] Rash   Lyrica [Pregabalin]  Photosensitivity       Latest Ref Rng & Units 04/28/2017    8:20 AM 03/28/2016    5:20 AM 03/27/2016   10:50 AM  CBC  WBC 4.0 - 10.5 K/uL 6.3  14.3  10.9  Hemoglobin 12.0 - 15.0 g/dL 13.4  10.1  12.2   Hematocrit 36.0 - 46.0 % 40.5  29.6  35.8   Platelets 150 - 400 K/uL 310  218  238       CMP     Component Value Date/Time   NA 138 04/28/2017 0820   K 4.2 04/28/2017 0820   CL 104 04/28/2017 0820   CO2 26 04/28/2017 0820   GLUCOSE 95 04/28/2017 0820   BUN 19 04/28/2017 0820   CREATININE 0.88 04/28/2017 0820   CALCIUM 9.0 04/28/2017 0820   PROT 6.3 07/06/2022 1043   ALBUMIN 3.9 04/28/2017 0820   AST 18 07/06/2022 1043   ALT 18 07/06/2022 1043   ALKPHOS 72 04/28/2017 0820   BILITOT 0.4 07/06/2022 1043   GFRNONAA >60 04/28/2017 0820   GFRAA >60 04/28/2017 0820     No results found.     Assessment & Plan:   1. Lymphedema The patient has lost significant weight since her last visit.  This is likely helped her lymphedema significantly.  She also recently received her lymphedema pumps with this will also help significantly with her lymphedema.  Patient is advised to continue with conservative tactics for lymphedema in addition to use of her lymphedema pump regularly.  Will have the patient return in 6 months or sooner if issues arise.  2. Essential hypertension Continue antihypertensive medications as already ordered, these medications have been reviewed and there are no changes at this time.  3. Varicose veins of both lower extremities with inflammation Pain from the varicosities not worsening.  She is also not having any worsening symptoms.  Based on this we will continue with conservative therapy as outlined above.   Current Outpatient Medications on File Prior to Visit  Medication Sig Dispense Refill   ALPRAZolam (XANAX) 1 MG tablet Take 1 mg by mouth 2 (two) times daily as needed for anxiety.     amLODipine (NORVASC) 5 MG tablet Take 1 tablet (5 mg total) by mouth  daily. 90 tablet 1   amphetamine-dextroamphetamine (ADDERALL) 30 MG tablet Take 30 mg by mouth daily.     aspirin-acetaminophen-caffeine (EXCEDRIN MIGRAINE) 250-250-65 MG tablet Take 2 tablets by mouth 2 (two) times daily as needed for headache.     buPROPion (WELLBUTRIN) 100 MG tablet Take 100 mg by mouth 2 (two) times daily. 2 in the morning and 1 in the afternoon     CAPLYTA 42 MG capsule Take 42 mg by mouth at bedtime.     methocarbamol (ROBAXIN) 750 MG tablet Take 1 tablet (750 mg total) by mouth every 8 (eight) hours as needed for muscle spasms. 90 tablet 1   ondansetron (ZOFRAN) 8 MG tablet Take 8 mg by mouth every 8 (eight) hours as needed for nausea or vomiting. Pt has not started medication yet     QUEtiapine (SEROQUEL) 100 MG tablet Take 100-200 mg by mouth at bedtime as needed.     No current facility-administered medications on file prior to visit.    There are no Patient Instructions on file for this visit. No follow-ups on file.   Kris Hartmann, NP

## 2022-08-18 ENCOUNTER — Ambulatory Visit (INDEPENDENT_AMBULATORY_CARE_PROVIDER_SITE_OTHER): Payer: 59 | Admitting: Family Medicine

## 2022-08-18 ENCOUNTER — Encounter: Payer: Self-pay | Admitting: Family Medicine

## 2022-08-18 VITALS — HR 97 | Temp 99.0°F | Ht 67.0 in | Wt 310.0 lb

## 2022-08-18 DIAGNOSIS — R051 Acute cough: Secondary | ICD-10-CM | POA: Diagnosis not present

## 2022-08-18 DIAGNOSIS — J0110 Acute frontal sinusitis, unspecified: Secondary | ICD-10-CM

## 2022-08-18 DIAGNOSIS — B349 Viral infection, unspecified: Secondary | ICD-10-CM

## 2022-08-18 DIAGNOSIS — T3695XA Adverse effect of unspecified systemic antibiotic, initial encounter: Secondary | ICD-10-CM

## 2022-08-18 DIAGNOSIS — B379 Candidiasis, unspecified: Secondary | ICD-10-CM | POA: Diagnosis not present

## 2022-08-18 LAB — POC COVID19 BINAXNOW: SARS Coronavirus 2 Ag: NEGATIVE

## 2022-08-18 LAB — POC INFLUENZA A&B (BINAX/QUICKVUE)
Influenza A, POC: NEGATIVE
Influenza B, POC: NEGATIVE

## 2022-08-18 MED ORDER — FLUCONAZOLE 150 MG PO TABS: ORAL_TABLET | ORAL | 0 refills | Status: DC

## 2022-08-18 MED ORDER — AMOXICILLIN 500 MG PO CAPS: 500.0000 mg | ORAL_CAPSULE | Freq: Two times a day (BID) | ORAL | 0 refills | Status: DC

## 2022-08-18 NOTE — Patient Instructions (Addendum)
Thank you for coming to the office today.  Likely viral syndrome Vs sinusitis  Keep on supportive care treatments OTC  Continue nasal spray Tylenol / cold flu etc medication  If still fever / sinus symptoms persistent or worsening in 48-72 hours, okay to take the antibiotic Amoxicillin cover for sinus infection.  Negative for COVID and Flu Today.  Likely needs to run its course.  Please schedule a Follow-up Appointment to: No follow-ups on file.  If you have any other questions or concerns, please feel free to call the office or send a message through [place]. You may also schedule an earlier appointment if necessary.  Additionally, you may be receiving a survey about your experience at our office within a few days to 1 week by e-mail or mail. We value your feedback.  Nobie Putnam, DO [place]

## 2022-08-18 NOTE — Progress Notes (Signed)
Subjective:    Patient ID: Suzanne Wilcox, female    DOB: 09-Jul-1975, 47 y.o.   MRN: [id number]  Suzanne Wilcox is a 47 y.o. female presenting on 08/18/2022 for Fever, Cough, Diarrhea, Headache, and Nasal Congestion  Patient presents for a same day appointment.  HPI  Viral Syndrome Fever, Sinusitis One of her kids woke up with fever and diarrhea, 24 hours Taking Tylenol Here for viral testing Cough Sinus drainage pressure Admit headache cough diarrhea Denies any nausea vomiting       07/31/2022    8:46 AM 05/28/2022    8:43 AM 12/19/2021   10:22 AM  Depression screen PHQ 2/9  Decreased Interest 0 3 2  Down, Depressed, Hopeless 0 1 1  PHQ - 2 Score 0 4 3  Altered sleeping 0 3 2  Tired, decreased energy 0 3 3  Change in appetite 0 2 3  Feeling bad or failure about yourself  0 2 2  Trouble concentrating 0 2 2  Moving slowly or fidgety/restless 0 0 0  Suicidal thoughts 0 0 0  PHQ-9 Score 0 16 15  Difficult doing work/chores Not difficult at all Very difficult Very difficult    Social History   Tobacco Use   Smoking status: Former    Packs/day: 1.00    Years: 18.00    Total pack years: 18.00    Types: Cigarettes    Quit date: 04/29/2009    Years since quitting: 13.3   Smokeless tobacco: Never  Vaping Use   Vaping Use: Never used  Substance Use Topics   Alcohol use: Not Currently    Comment: OCCASION   Drug use: No    Review of Systems Per HPI unless specifically indicated above     Objective:    Pulse 97   Temp 99 F (37.2 C) (Oral)   Ht 5' 7"$  (1.702 m)   Wt (!) 310 lb (140.6 kg)   SpO2 97%   BMI 48.55 kg/m   Wt Readings from Last 3 Encounters:  08/18/22 (!) 310 lb (140.6 kg)  08/14/22 (!) 317 lb 6.4 oz (144 kg)  07/31/22 (!) 327 lb (148.3 kg)    Physical Exam Vitals and nursing note reviewed.  Constitutional:      General: She is not in acute distress.    Appearance: She is well-developed. She is not diaphoretic.     Comments: Well-appearing,  comfortable, cooperative  HENT:     Head: Normocephalic and atraumatic.  Eyes:     General:        Right eye: No discharge.        Left eye: No discharge.     Conjunctiva/sclera: Conjunctivae normal.  Neck:     Thyroid: No thyromegaly.  Cardiovascular:     Rate and Rhythm: Normal rate and regular rhythm.     Heart sounds: Normal heart sounds. No murmur heard. Pulmonary:     Effort: Pulmonary effort is normal. No respiratory distress.     Breath sounds: Normal breath sounds. No wheezing or rales.  Musculoskeletal:        General: Normal range of motion.     Cervical back: Normal range of motion and neck supple.  Lymphadenopathy:     Cervical: No cervical adenopathy.  Skin:    General: Skin is warm and dry.     Findings: No erythema or rash.  Neurological:     Mental Status: She is alert and oriented to person, place, and time.  Psychiatric:        Behavior: Behavior normal.     Comments: Well groomed, good eye contact, normal speech and thoughts    Results for orders placed or performed in visit on 08/18/22  POC Influenza A&B (Binax test)  Result Value Ref Range   Influenza A, POC Negative Negative   Influenza B, POC Negative Negative  POC COVID-19  Result Value Ref Range   SARS Coronavirus 2 Ag Negative Negative      Assessment & Plan:   Problem List Items Addressed This Visit   None Visit Diagnoses     Acute viral syndrome    -  Primary   Relevant Medications   fluconazole (DIFLUCAN) 150 MG tablet   Acute cough       Relevant Orders   POC Influenza A&B (Binax test) (Completed)   POC COVID-19 (Completed)   Antibiotic-induced yeast infection       Relevant Medications   fluconazole (DIFLUCAN) 150 MG tablet   Acute non-recurrent frontal sinusitis       Relevant Medications   amoxicillin (AMOXIL) 500 MG capsule   fluconazole (DIFLUCAN) 150 MG tablet       Likely viral syndrome Vs sinusitis  Keep on supportive care treatments OTC  Continue nasal  spray Tylenol / cold flu etc medication  If still fever / sinus symptoms persistent or worsening in 48-72 hours, okay to take the antibiotic Amoxicillin cover for sinus infection.  Negative for COVID and Flu Today.  Likely needs to run its course.  Meds ordered this encounter  Medications   amoxicillin (AMOXIL) 500 MG capsule    Sig: Take 1 capsule (500 mg total) by mouth 2 (two) times daily. For 10 days    Dispense:  20 capsule    Refill:  0   fluconazole (DIFLUCAN) 150 MG tablet    Sig: Take one tablet by mouth on Day 1. Repeat dose 2nd tablet on Day 3.    Dispense:  2 tablet    Refill:  0      Follow up plan: Return if symptoms worsen or fail to improve.   Nobie Putnam, [facility] 08/18/2022, 11:37 AM

## 2022-09-01 ENCOUNTER — Telehealth: Payer: Self-pay | Admitting: Gastroenterology

## 2022-09-01 NOTE — Telephone Encounter (Signed)
Pt has colonoscopy questions please rerturn [phone]

## 2022-09-03 ENCOUNTER — Encounter: Payer: Self-pay | Admitting: Gastroenterology

## 2022-09-04 ENCOUNTER — Ambulatory Visit
Admission: RE | Admit: 2022-09-04 | Discharge: 2022-09-04 | Disposition: A | Discharge status: Home / Self Care | Payer: 59 | Source: Ambulatory Visit | Attending: Gastroenterology | Admitting: Gastroenterology

## 2022-09-04 ENCOUNTER — Other Ambulatory Visit: Payer: Self-pay

## 2022-09-04 ENCOUNTER — Ambulatory Visit: Payer: 59 | Admitting: Certified Registered Nurse Anesthetist

## 2022-09-04 ENCOUNTER — Encounter: Payer: Self-pay | Admitting: Gastroenterology

## 2022-09-04 ENCOUNTER — Encounter
Admission: RE | Disposition: A | Discharge status: Home / Self Care | Payer: Self-pay | Source: Ambulatory Visit | Attending: Gastroenterology

## 2022-09-04 DIAGNOSIS — F319 Bipolar disorder, unspecified: Secondary | ICD-10-CM | POA: Insufficient documentation

## 2022-09-04 DIAGNOSIS — I10 Essential (primary) hypertension: Secondary | ICD-10-CM | POA: Diagnosis not present

## 2022-09-04 DIAGNOSIS — G4730 Sleep apnea, unspecified: Secondary | ICD-10-CM | POA: Insufficient documentation

## 2022-09-04 DIAGNOSIS — R195 Other fecal abnormalities: Secondary | ICD-10-CM | POA: Insufficient documentation

## 2022-09-04 DIAGNOSIS — K644 Residual hemorrhoidal skin tags: Secondary | ICD-10-CM | POA: Insufficient documentation

## 2022-09-04 DIAGNOSIS — E785 Hyperlipidemia, unspecified: Secondary | ICD-10-CM | POA: Diagnosis not present

## 2022-09-04 DIAGNOSIS — F419 Anxiety disorder, unspecified: Secondary | ICD-10-CM | POA: Diagnosis not present

## 2022-09-04 DIAGNOSIS — K5730 Diverticulosis of large intestine without perforation or abscess without bleeding: Secondary | ICD-10-CM | POA: Insufficient documentation

## 2022-09-04 DIAGNOSIS — K219 Gastro-esophageal reflux disease without esophagitis: Secondary | ICD-10-CM | POA: Diagnosis not present

## 2022-09-04 DIAGNOSIS — Z79899 Other long term (current) drug therapy: Secondary | ICD-10-CM | POA: Insufficient documentation

## 2022-09-04 DIAGNOSIS — Z6841 Body Mass Index (BMI) 40.0 and over, adult: Secondary | ICD-10-CM | POA: Diagnosis not present

## 2022-09-04 DIAGNOSIS — Z9884 Bariatric surgery status: Secondary | ICD-10-CM | POA: Diagnosis not present

## 2022-09-04 DIAGNOSIS — Z1211 Encounter for screening for malignant neoplasm of colon: Secondary | ICD-10-CM | POA: Insufficient documentation

## 2022-09-04 DIAGNOSIS — Z87891 Personal history of nicotine dependence: Secondary | ICD-10-CM | POA: Insufficient documentation

## 2022-09-04 HISTORY — PX: COLONOSCOPY WITH PROPOFOL: SHX5780

## 2022-09-04 LAB — POCT PREGNANCY, URINE: Preg Test, Ur: NEGATIVE

## 2022-09-04 SURGERY — COLONOSCOPY WITH PROPOFOL
Anesthesia: General

## 2022-09-04 MED ORDER — DEXMEDETOMIDINE HCL IN NACL 80 MCG/20ML IV SOLN
INTRAVENOUS | Status: DC | PRN
  Administered 2022-09-04: 8 ug via INTRAVENOUS
  Administered 2022-09-04: 4 ug via INTRAVENOUS
  Administered 2022-09-04: 8 ug via INTRAVENOUS
  Administered 2022-09-04 (×3): 4 ug via INTRAVENOUS

## 2022-09-04 MED ORDER — LIDOCAINE HCL (CARDIAC) PF 100 MG/5ML IV SOSY
PREFILLED_SYRINGE | INTRAVENOUS | Status: DC | PRN
  Administered 2022-09-04: 100 mg via INTRAVENOUS

## 2022-09-04 MED ORDER — PROPOFOL 500 MG/50ML IV EMUL
INTRAVENOUS | Status: DC | PRN
  Administered 2022-09-04: 140 ug/kg/min via INTRAVENOUS

## 2022-09-04 MED ORDER — PHENYLEPHRINE 80 MCG/ML (10ML) SYRINGE FOR IV PUSH (FOR BLOOD PRESSURE SUPPORT)
PREFILLED_SYRINGE | INTRAVENOUS | Status: AC
  Filled 2022-09-04: qty 10

## 2022-09-04 MED ORDER — PROPOFOL 10 MG/ML IV BOLUS
INTRAVENOUS | Status: AC
  Filled 2022-09-04: qty 20

## 2022-09-04 MED ORDER — GLYCOPYRROLATE 0.2 MG/ML IJ SOLN
INTRAMUSCULAR | Status: DC | PRN
  Administered 2022-09-04: .2 mg via INTRAVENOUS

## 2022-09-04 MED ORDER — GLYCOPYRROLATE 0.2 MG/ML IJ SOLN
INTRAMUSCULAR | Status: AC
  Filled 2022-09-04: qty 4

## 2022-09-04 MED ORDER — SODIUM CHLORIDE 0.9 % IV SOLN: INTRAVENOUS | Status: DC

## 2022-09-04 MED ORDER — PROPOFOL 10 MG/ML IV BOLUS
INTRAVENOUS | Status: DC | PRN
  Administered 2022-09-04: 20 mg via INTRAVENOUS
  Administered 2022-09-04: 30 mg via INTRAVENOUS
  Administered 2022-09-04: 50 mg via INTRAVENOUS
  Administered 2022-09-04 (×2): 30 mg via INTRAVENOUS

## 2022-09-04 MED ORDER — PHENYLEPHRINE 80 MCG/ML (10ML) SYRINGE FOR IV PUSH (FOR BLOOD PRESSURE SUPPORT)
PREFILLED_SYRINGE | INTRAVENOUS | Status: DC | PRN
  Administered 2022-09-04 (×2): 80 ug via INTRAVENOUS

## 2022-09-04 NOTE — Anesthesia Preprocedure Evaluation (Signed)
Anesthesia Evaluation  Patient identified by MRN, date of birth, ID band Patient awake    Reviewed: Allergy & Precautions, NPO status , Patient's Chart, lab work & pertinent test results  History of Anesthesia Complications Negative for: history of anesthetic complications  Airway Mallampati: I  TM Distance: >3 FB Neck ROM: Full    Dental no notable dental hx. (+) Teeth Intact   Pulmonary sleep apnea , pneumonia, resolved, neg COPD, Patient abstained from smoking.Not current smoker, former smoker Hx aspiration pneumonia after her gastric bypass 4 months ago. Hs recovered fully since then.   Pulmonary exam normal breath sounds clear to auscultation       Cardiovascular Exercise Tolerance: Good METShypertension, (-) CAD and (-) Past MI (-) dysrhythmias  Rhythm:Regular Rate:Normal - Systolic murmurs    Neuro/Psych  Headaches PSYCHIATRIC DISORDERS Anxiety Depression Bipolar Disorder      GI/Hepatic ,GERD  Medicated and Controlled,,(+)     (-) substance abuse    Endo/Other  neg diabetes  Morbid obesity  Renal/GU negative Renal ROS     Musculoskeletal   Abdominal  (+) + obese  Peds  Hematology   Anesthesia Other Findings Past Medical History: No date: Allergy No date: Anxiety No date: Bipolar disorder (HCC) No date: Bipolar disorder ([facility]) No date: Depression No date: Generalized headaches No date: GERD (gastroesophageal reflux disease) No date: Hx of varicella No date: Hyperlipidemia No date: Hypertension No date: Missed abortions     Comment:  X 6 04/29/2012: Obesity 03/28/2016: Postoperative retention of urine 03/27/2016: Postpartum care following cesarean delivery (9/29) No date: Sleep apnea 03/22/2004: Termination of pregnancy (fetus)     Comment:  X 1 No date: Vaginal Pap smear, abnormal  Reproductive/Obstetrics                              Anesthesia Physical Anesthesia  Plan  ASA: 3  Anesthesia Plan: General   Post-op Pain Management: Minimal or no pain anticipated   Induction: Intravenous  PONV Risk Score and Plan: 3 and Propofol infusion, TIVA and Ondansetron  Airway Management Planned: Nasal Cannula  Additional Equipment: None  Intra-op Plan:   Post-operative Plan:   Informed Consent: I have reviewed the patients History and Physical, chart, labs and discussed the procedure including the risks, benefits and alternatives for the proposed anesthesia with the patient or authorized representative who has indicated his/her understanding and acceptance.     Dental advisory given  Plan Discussed with: CRNA and Surgeon  Anesthesia Plan Comments: (Discussed risks of anesthesia with patient, including possibility of difficulty with spontaneous ventilation under anesthesia necessitating airway intervention, PONV, and rare risks such as cardiac or respiratory or neurological events, and allergic reactions. Discussed the role of CRNA in patient's perioperative care. Patient understands. Patient informed about increased incidence of above perioperative risk due to high BMI. Patient understands.  )         Anesthesia Quick Evaluation

## 2022-09-04 NOTE — Anesthesia Procedure Notes (Signed)
Date/Time: 09/04/2022 11:32 AM  Performed by: Lily Peer, Semya Klinke, CRNAPre-anesthesia Checklist: Patient identified, Emergency Drugs available, Suction available, Patient being monitored and Timeout performed Patient Re-evaluated:Patient Re-evaluated prior to induction Oxygen Delivery Method: Simple face mask Induction Type: IV induction

## 2022-09-04 NOTE — Transfer of Care (Signed)
Immediate Anesthesia Transfer of Care Note  Patient: Suzanne Wilcox  Procedure(s) Performed: COLONOSCOPY WITH PROPOFOL  Patient Location: Endoscopy Unit  Anesthesia Type:General  Level of Consciousness: awake and drowsy  Airway & Oxygen Therapy: Patient Spontanous Breathing  Post-op Assessment: Report given to RN and Post -op Vital signs reviewed and stable  Post vital signs: Reviewed and stable  Last Vitals:  Vitals Value Taken Time  BP 122/65   Temp    Pulse 73 09/04/22 1220  Resp 18 09/04/22 1220  SpO2 100 % 09/04/22 1220  Vitals shown include unvalidated device data.  Last Pain:  Vitals:   09/04/22 1056  TempSrc: Temporal  PainSc: 0-No pain         Complications: No notable events documented.

## 2022-09-04 NOTE — H&P (Signed)
Cephas Darby, MD [address]  [place], [place] [address]  Main: [phone]  Fax: [phone] Pager: [phone]  Primary Care Physician:  Olin Hauser, DO Primary Gastroenterologist:  Dr. Cephas Darby  Pre-Procedure History & Physical: HPI:  Suzanne Wilcox is a 47 y.o. female is here for an colonoscopy.   Past Medical History:  Diagnosis Date   Allergy    Anxiety    Bipolar disorder ([place])    Bipolar disorder ([place])    Depression    Generalized headaches    GERD (gastroesophageal reflux disease)    Hx of varicella    Hyperlipidemia    Hypertension    Missed abortions    X 6   Obesity 04/29/2012   Postoperative retention of urine 03/28/2016   Postpartum care following cesarean delivery (9/29) 03/27/2016   Sleep apnea    Termination of pregnancy (fetus) 03/22/2004   X 1   Vaginal Pap smear, abnormal     Past Surgical History:  Procedure Laterality Date   CESAREAN SECTION  06/29/2009   X 1 [place] N/A 05/22/2014   Procedure: Repeat CESAREAN SECTION;  Surgeon: Lovenia Kim, MD;  Location: [facility];  Service: Obstetrics;  Laterality: N/A;  EDD: 05/31/14   CESAREAN SECTION WITH BILATERAL TUBAL LIGATION Bilateral 03/27/2016   Procedure: Repeat CESAREAN SECTION WITH BILATERAL TUBAL LIGATION;  Surgeon: Brien Few, MD;  Location: [facility];  Service: Obstetrics;  Laterality: Bilateral;  EDD: 04/01/16   CHOLECYSTECTOMY N/A 04/29/2017   Procedure: LAPAROSCOPIC CHOLECYSTECTOMY WITH INTRAOPERATIVE CHOLANGIOGRAM;  Surgeon: Alphonsa Overall, MD;  Location: [facility];  Service: General;  Laterality: N/A;  ERAS PATHWAY   DILATION AND CURETTAGE OF UTERUS   2005, 2010     x2 MAB   ESOPHAGOGASTRODUODENOSCOPY N/A 01/11/2017   Procedure: ESOPHAGOGASTRODUODENOSCOPY (EGD);  Surgeon: Ronnette Juniper, MD;  Location: [facility];  Service: Gastroenterology;  Laterality: N/A;   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 Authorizing Provider  ALPRAZolam Duanne Moron) 1 MG tablet Take 1 mg by mouth 2 (two) times daily as needed for anxiety.    [provider]  amLODipine (NORVASC) 5 MG tablet Take 1 tablet (5 mg total) by mouth daily. 05/05/22   Karamalegos, Devonne Doughty, DO  amoxicillin (AMOXIL) 500 MG capsule Take 1 capsule (500 mg total) by mouth 2 (two) times daily. For 10 days 08/18/22   Olin Hauser, DO  amphetamine-dextroamphetamine (ADDERALL) 30 MG tablet Take 30 mg by mouth daily.    [provider]  aspirin-acetaminophen-caffeine (EXCEDRIN MIGRAINE) [id number] MG tablet Take 2 tablets by mouth 2 (two) times daily as needed for headache.    [provider]  buPROPion (WELLBUTRIN) 100 MG tablet Take 100 mg by mouth 2 (two) times daily. 2 in the morning and 1 in the afternoon 06/08/22   [provider]  CAPLYTA 42 MG capsule Take 42 mg by mouth at bedtime. 10/13/21   [provider]  fluconazole (DIFLUCAN) 150 MG tablet Take one tablet by mouth on Day 1. Repeat dose 2nd tablet on Day 3. 08/18/22   Olin Hauser, DO  methocarbamol (  ROBAXIN) 750 MG tablet Take 1 tablet (750 mg total) by mouth every 8 (eight) hours as needed for muscle spasms. 07/06/22   Karamalegos, Devonne Doughty, DO  ondansetron (ZOFRAN) 8 MG tablet Take 8 mg by mouth every 8 (eight) hours as needed for nausea or vomiting. Pt has not started medication yet    [provider]  pantoprazole (PROTONIX) 40 MG tablet Take 40 mg by mouth 2 (two) times daily. 04/12/22   [provider]  QUEtiapine (SEROQUEL) 100 MG tablet Take 100-200 mg by mouth at bedtime as needed.    [provider]    Allergies as of  08/06/2022 - Review Complete 07/31/2022  Allergen Reaction Noted   Adhesive [tape] Rash 01/25/2016   Lyrica [pregabalin] Photosensitivity 08/17/2018    Family History  Problem Relation Age of Onset   Depression Mother    Skin cancer Mother    Pancreatic cancer Father    Kidney Stones Father    Stroke Father    Dementia Father    Skin cancer Sister    Depression Paternal Grandmother    Breast cancer Neg Hx     Social History   Socioeconomic History   Marital status: Single    Spouse name: Not on file   Number of children: Not on file   Years of education: Not on file   Highest education level: Not on file  Occupational History   Not on file  Tobacco Use   Smoking status: Former    Packs/day: 1.00    Years: 18.00    Total pack years: 18.00    Types: Cigarettes    Quit date: 04/29/2009    Years since quitting: 13.3   Smokeless tobacco: Never  Vaping Use   Vaping Use: Never used  Substance and Sexual Activity   Alcohol use: Not Currently    Comment: OCCASION   Drug use: No   Sexual activity: Not on file  Other Topics Concern   Not on file  Social History Narrative   Right handed   One story home   4 children   Drinks caffeine 1-2 cups a day   Social Determinants of Health   Financial Resource Strain: Low Risk  (07/31/2022)   Overall Financial Resource Strain (CARDIA)    Difficulty of Paying Living Expenses: Not hard at all  Food Insecurity: No Food Insecurity (07/31/2022)   Hunger Vital Sign    Worried About Running Out of Food in the Last Year: Never true    Ran Out of Food in the Last Year: Never true  Transportation Needs: No Transportation Needs (07/31/2022)   PRAPARE - Hydrologist (Medical): No    Lack of Transportation (Non-Medical): No  Physical Activity: Insufficiently Active (07/31/2022)   Exercise Vital Sign    Days of Exercise per Week: 2 days    Minutes of Exercise per Session: 20 min  Stress: No Stress Concern Present  (07/31/2022)   Ruth    Feeling of Stress : Only a little  Social Connections: Socially Isolated (07/31/2022)   Social Connection and Isolation Panel [NHANES]    Frequency of Communication with Friends and Family: Three times a week    Frequency of Social Gatherings with Friends and Family: Once a week    Attends Religious Services: Never    Marine scientist or Organizations: No    Attends Archivist Meetings: Never  Marital Status: Divorced  Human resources officer Violence: Not At Risk (07/31/2022)   Humiliation, Afraid, Rape, and Kick questionnaire    Fear of Current or Ex-Partner: No    Emotionally Abused: No    Physically Abused: No    Sexually Abused: No    Review of Systems: See HPI, otherwise negative ROS  Physical Exam: BP 133/85   Pulse 71   Temp (!) 97.3 F (36.3 C) (Temporal)   Resp 20   Ht '5\' 7"'$  (1.702 m)   Wt (!) 137.4 kg   SpO2 99%   BMI 47.46 kg/m  General:   Alert,  pleasant and cooperative in NAD Head:  Normocephalic and atraumatic. Neck:  Supple; no masses or thyromegaly. Lungs:  Clear throughout to auscultation.    Heart:  Regular rate and rhythm. Abdomen:  Soft, nontender and nondistended. Normal bowel sounds, without guarding, and without rebound.   Neurologic:  Alert and  oriented x4;  grossly normal neurologically.  Impression/Plan: EVERLEA BODDICKER is here for an colonoscopy to be performed for cologuard positive  Risks, benefits, limitations, and alternatives regarding  colonoscopy have been reviewed with the patient.  Questions have been answered.  All parties agreeable.   Sherri Sear, MD  09/04/2022, 11:26 AM

## 2022-09-04 NOTE — Anesthesia Postprocedure Evaluation (Signed)
Anesthesia Post Note  Patient: Suzanne Wilcox  Procedure(s) Performed: COLONOSCOPY WITH PROPOFOL  Patient location during evaluation: Endoscopy Anesthesia Type: General Level of consciousness: awake and alert Pain management: pain level controlled Vital Signs Assessment: post-procedure vital signs reviewed and stable Respiratory status: spontaneous breathing, nonlabored ventilation, respiratory function stable and patient connected to nasal cannula oxygen Cardiovascular status: blood pressure returned to baseline and stable Postop Assessment: no apparent nausea or vomiting Anesthetic complications: no   No notable events documented.   Last Vitals:  Vitals:   09/04/22 1220 09/04/22 1243  BP: 122/65 (!) 162/81  Pulse:  67  Resp:    Temp: 36.8 C   SpO2:  99%    Last Pain:  Vitals:   09/04/22 1243  TempSrc:   PainSc: 0-No pain                 Arita Miss

## 2022-09-04 NOTE — Op Note (Signed)
[facility] Gastroenterology Patient Name: Suzanne Wilcox Procedure Date: 09/04/2022 11:24 AM MRN: [id number] Account #: [id number] Date of Birth: 06-06-1976 Admit Type: Outpatient Age: 47 Room: [facility] ENDO ROOM 2 Gender: Female Note Status: Finalized Instrument Name: Colonoscope [id number] Procedure:             Colonoscopy Indications:           This is the patient's first colonoscopy, Positive                         Cologuard test Providers:             Lin Landsman MD, MD Referring MD:          Olin Hauser (Referring MD) Medicines:             General Anesthesia Complications:         No immediate complications. Estimated blood loss: None. Procedure:             Pre-Anesthesia Assessment:                        - Prior to the procedure, a History and Physical was                         performed, and patient medications and allergies were                         reviewed. The patient is competent. The risks and                         benefits of the procedure and the sedation options and                         risks were discussed with the patient. All questions                         were answered and informed consent was obtained.                         Patient identification and proposed procedure were                         verified by the physician, the nurse, the                         anesthesiologist, the anesthetist and the technician                         in the pre-procedure area in the procedure room in the                         endoscopy suite. Mental Status Examination: alert and                         oriented. Airway Examination: normal oropharyngeal                         airway and neck mobility. Respiratory Examination:  clear to auscultation. CV Examination: normal.                         Prophylactic Antibiotics: The patient does not require                         prophylactic antibiotics.  Prior Anticoagulants: The                         patient has taken no anticoagulant or antiplatelet                         agents. ASA Grade Assessment: III - A patient with                         severe systemic disease. After reviewing the risks and                         benefits, the patient was deemed in satisfactory                         condition to undergo the procedure. The anesthesia                         plan was to use general anesthesia. Immediately prior                         to administration of medications, the patient was                         re-assessed for adequacy to receive sedatives. The                         heart rate, respiratory rate, oxygen saturations,                         blood pressure, adequacy of pulmonary ventilation, and                         response to care were monitored throughout the                         procedure. The physical status of the patient was                         re-assessed after the procedure.                        After obtaining informed consent, the colonoscope was                         passed under direct vision. Throughout the procedure,                         the patient's blood pressure, pulse, and oxygen                         saturations were monitored continuously. The  Colonoscope was introduced through the anus and                         advanced to the the cecum, identified by appendiceal                         orifice and ileocecal valve. The colonoscopy was                         extremely difficult due to significant looping and the                         patient's body habitus. Successful completion of the                         procedure was aided by changing the patient to a                         supine position and applying abdominal pressure. The                         patient tolerated the procedure well. The quality of                         the bowel  preparation was evaluated using the BBPS                         [facility] Bowel Preparation Scale) with scores of: Right                         Colon = 2 (minor amount of residual staining, small                         fragments of stool and/or opaque liquid, but mucosa                         seen well), Transverse Colon = 3 (entire mucosa seen                         well with no residual staining, small fragments of                         stool or opaque liquid) and Left Colon = 3 (entire                         mucosa seen well with no residual staining, small                         fragments of stool or opaque liquid). The total BBPS                         score equals 8. The ileocecal valve, appendiceal                         orifice, and rectum were photographed. Findings:      The perianal and digital rectal examinations were normal. Pertinent  negatives include normal sphincter tone and no palpable rectal lesions.      Multiple small-mouthed diverticula were found in the sigmoid colon.      Non-bleeding external hemorrhoids were found during retroflexion. The       hemorrhoids were medium-sized. Impression:            - Diverticulosis in the sigmoid colon.                        - Non-bleeding external hemorrhoids.                        - No specimens collected. Recommendation:        - Discharge patient to home (with escort).                        - Resume previous diet today.                        - Continue present medications.                        - Repeat colonoscopy in 10 years for screening                         purposes. Procedure Code(s):     --- Professional ---                        [phone], Colonoscopy, flexible; diagnostic, including                         collection of specimen(s) by brushing or washing, when                         performed (separate procedure) Diagnosis Code(s):     --- Professional ---                        K64.4, Residual  hemorrhoidal skin tags                        R19.5, Other fecal abnormalities                        K57.30, Diverticulosis of large intestine without                         perforation or abscess without bleeding CPT copyright 2022 American Medical Association. All rights reserved. The codes documented in this report are preliminary and upon coder review may  be revised to meet current compliance requirements. Dr. Ulyess Mort Lin Landsman MD, MD 09/04/2022 12:18:43 PM This report has been signed electronically. Number of Addenda: 0 Note Initiated On: 09/04/2022 11:24 AM Scope Withdrawal Time: 0 hours 8 minutes 2 seconds  Total Procedure Duration: 0 hours 39 minutes 29 seconds  Estimated Blood Loss:  Estimated blood loss: none.      [facility]

## 2022-09-07 ENCOUNTER — Encounter: Payer: Self-pay | Admitting: Gastroenterology

## 2022-09-23 ENCOUNTER — Ambulatory Visit (INDEPENDENT_AMBULATORY_CARE_PROVIDER_SITE_OTHER): Payer: 59 | Admitting: Family Medicine

## 2022-09-23 VITALS — BP 140/88 | HR 90 | Ht 67.0 in | Wt 295.2 lb

## 2022-09-23 DIAGNOSIS — M25511 Pain in right shoulder: Secondary | ICD-10-CM

## 2022-09-23 DIAGNOSIS — M5412 Radiculopathy, cervical region: Secondary | ICD-10-CM | POA: Diagnosis not present

## 2022-09-23 DIAGNOSIS — M722 Plantar fascial fibromatosis: Secondary | ICD-10-CM

## 2022-09-23 DIAGNOSIS — M542 Cervicalgia: Secondary | ICD-10-CM

## 2022-09-23 DIAGNOSIS — G8929 Other chronic pain: Secondary | ICD-10-CM

## 2022-09-23 MED ORDER — PREDNISONE 20 MG PO TABS: ORAL_TABLET | ORAL | 0 refills | Status: DC

## 2022-09-23 NOTE — Patient Instructions (Addendum)
Thank you for coming to the office today.  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 - This is inflammation of the fibrous connection on the bottom of the foot, and can have small micro tears over time that become painful. It usually will have flare ups lasting days to weeks, and may come back after it heals if it is re-aggravated again. - Often there is a bone spur or arthritis of the heel bone that causes this - Also it may be caused by abnormal footwear, walking pattern or other problems  If you are experiencing an acute flare with pain, this is usually worst first thing in the morning when the plantar fascia is tight and stiff. First step out of bed is painful usually, and it may gradually improve with stretching and walking.  Recommend: - Rest / relative rest with activity modification avoid overuse / prolonged stand - Ice packs (make sure you use a towel or sock / something to protect skin)  May also try topical muscle rub, icy hot, tiger balm  Start the exercises listed below, gradually increase them as instructed. May be sore at first and hopefully will stretch out and help reduce pain later.  Plantar Fascia - Tension Night Splint  Please schedule a Follow-up Appointment to: Return if symptoms worsen or fail to improve.  If you have any other questions or concerns, please feel free to call the office or send a message through [place]. You may also schedule an earlier appointment if necessary.  Additionally, you may be receiving a survey about your experience at our office within a few days to 1 week by e-mail or mail. We value your feedback.  Nobie Putnam, DO [facility], [facility]              Plantar Fascia Stretches / Exercises  See other page with pictures  of each exercise.  Start with 1 or 2 of these exercises that you are most comfortable with. Do not do any exercises that cause you significant worsening pain. Some of these may cause some "stretching soreness" but it should go away after you stop the exercise, and get better over time. Gradually increase up to 3-4 exercises as tolerated.  You may begin exercising the muscles of your foot right away by gently stretching them as follows:  Stretching: Towel stretch: Sit on a hard surface with your injured leg stretched out in front of you. Loop a towel around the ball of your foot and pull the towel toward your body keeping your knee straight. Hold this position for 15 to 30 seconds then relax. Repeat 3 times. When the towel stretch becomes to easy, you may begin doing the standing calf stretch.  Standing calf stretch: Facing a wall, put your hands against the wall at about eye level. Keep the injured leg back, the uninjured leg forward, and the heel of your injured leg on the floor. Turn your injured foot slightly inward (as if you were pigeon-toed) as you slowly lean into the wall until you feel a stretch in the back of your calf. Hold for 15 to 30 seconds. Repeat 3 times. Do this exercise several times each day. When you can stand comfortably on your injured foot, you  can begin stretching the bottom of your foot using the plantar fascia stretch.  Plantar fascia stretch: Stand with the ball of your injured foot on a stair. Reach for the bottom step with your heel until you feel a stretch in the arch of your foot. Hold this position for 15 to 30 seconds and then relax. Repeat 3 times. After you have stretched the bottom muscles of your foot, you can begin strengthening the top muscles of your foot.  Frozen can roll: Roll your bare injured foot back and forth from your heel to your mid-arch over a frozen juice can. Repeat for 3 to 5 minutes. This exercise is particularly helpful if done first thing in  the morning. Towel pickup: With your heel on the ground, pick up a towel with your toes. Release. Repeat 10 to 20 times. When this gets easy, add more resistance by placing a book or small weight on the towel. Static and dynamic balance exercises Place a chair next to your non-injured leg and stand upright. (This will provide you with balance if needed.) Stand on your injured foot. Try to raise the arch of your foot while keeping your toes on the floor. Try to maintain this position and balance on your injured side for 30 seconds. This exercise can be made more difficult by doing it on a piece of foam or a pillow, or with your eyes closed. Stand in the same position as above. Keep your foot in this position and reach forward in front of you with your injured side's hand, allowing your knee to bend. Repeat this 10 times while maintaining the arch height. This exercise can be made more difficult by reaching farther in front of you. Do 2 sets. Stand in the same position as above. While maintaining your arch height, reach the injured side's hand across your body toward the chair. The farther you reach, the more challenging the exercise. Do 2 sets of 10.  Next, you can begin strengthening the muscles of your foot and lower leg by using elastic tubing.  Strengthening: Resisted dorsiflexion: Sit with your injured leg out straight and your foot facing a doorway. Tie a loop in one end of the tubing. Put your foot through the loop so that the tubing goes around the arch of your foot. Tie a knot in the other end of the tubing and shut the knot in the door. Move backward until there is tension in the tubing. Keeping your knee straight, pull your foot toward your body, stretching the tubing. Slowly return to the starting position. Do 3 sets of 10. Resisted plantar flexion: Sit with your leg outstretched and loop the middle section of the tubing around the ball of your foot. Hold the ends of the tubing in both hands.  Gently press the ball of your foot down and point your toes, stretching the tubing. Return to the starting position. Do 3 sets of 10. Resisted inversion: Sit with your legs out straight and cross your uninjured leg over your injured ankle. Wrap the tubing around the ball of your injured foot and then loop it around your uninjured foot so that the tubing is anchored there at one end. Hold the other end of the tubing in your hand. Turn your injured foot inward and upward. This will stretch the tubing. Return to the starting position. Do 3 sets of 10. Resisted eversion: Sit with both legs stretched out in front of you, with your feet about a shoulder's width apart.  Tie a loop in one end of the tubing. Put your injured foot through the loop so that the tubing goes around the arch of that foot and wraps around the outside of the uninjured foot. Hold onto the other end of the tubing with your hand to provide tension. Turn your injured foot up and out. Make sure you keep your uninjured foot still so that it will allow the tubing to stretch as you move your injured foot. Return to the starting position. Do 3 sets of 10.

## 2022-09-23 NOTE — Progress Notes (Signed)
Subjective:    Patient ID: Suzanne Wilcox, female    DOB: 05-29-1976, 47 y.o.   MRN: [id number]  Suzanne Wilcox is a 47 y.o. female presenting on 09/23/2022 for Neck Pain  Patient presents for a same day appointment.  HPI  Right Shoulder / Neck / Cervical Spine pain - Radiculopathy Recent flare now Similar to before, with nerve flare and it can radiate into R arm and can limit her motion if severe She has been able to come off of the pain medication Percocet, had problem with rebound pain, now doing better off this She has done well with exercising and overall improve. She was doing very well with Excedrin without aspirin for tension headache x 2 a day and Methocarbamol muscle relaxant On PPI Pantoprazole 40mg  TWICE A DAY, for stomach protection, s/p bariatric surgery  Right sided plantar fasciitis flare as well. Worse pain with new shoes. She has orthotic insoles now, improved. Has done stretching before.      09/23/2022    8:48 AM 07/31/2022    8:46 AM 05/28/2022    8:43 AM  Depression screen PHQ 2/9  Decreased Interest 1 0 3  Down, Depressed, Hopeless 1 0 1  PHQ - 2 Score 2 0 4  Altered sleeping 3 0 3  Tired, decreased energy 1 0 3  Change in appetite 1 0 2  Feeling bad or failure about yourself  1 0 2  Trouble concentrating 2 0 2  Moving slowly or fidgety/restless 0 0 0  Suicidal thoughts 0 0 0  PHQ-9 Score 10 0 16  Difficult doing work/chores Somewhat difficult Not difficult at all Very difficult    Social History   Tobacco Use   Smoking status: Former    Packs/day: 1.00    Years: 18.00    Additional pack years: 0.00    Total pack years: 18.00    Types: Cigarettes    Quit date: 04/29/2009    Years since quitting: 13.4   Smokeless tobacco: Never  Vaping Use   Vaping Use: Never used  Substance Use Topics   Alcohol use: Not Currently    Comment: OCCASION   Drug use: No    Review of Systems Per HPI unless specifically indicated above     Objective:     BP (!) 140/88   Pulse 90   Ht 5\' 7"  (1.702 m)   Wt 295 lb 3.2 oz (133.9 kg)   SpO2 99%   BMI 46.23 kg/m   Wt Readings from Last 3 Encounters:  09/23/22 295 lb 3.2 oz (133.9 kg)  09/04/22 (!) 303 lb (137.4 kg)  08/18/22 (!) 310 lb (140.6 kg)    Physical Exam Results for orders placed or performed during the hospital encounter of 09/04/22  Pregnancy, urine POC  Result Value Ref Range   Preg Test, Ur NEGATIVE NEGATIVE      Assessment & Plan:   Problem List Items Addressed This Visit   None Visit Diagnoses     Cervical radiculopathy    -  Primary   Relevant Medications   predniSONE (DELTASONE) 20 MG tablet   Neck pain on right side       Relevant Medications   predniSONE (DELTASONE) 20 MG tablet   Chronic right shoulder pain       Relevant Medications   predniSONE (DELTASONE) 20 MG tablet   Plantar fasciitis           For the radicular nerve pinch in neck /  shoulder, acute on chronic flare  Prednisone taper over 12 days 40 mg for 4 days (20mg  x 2) 20 mg for 4 days (20mg  x 1) 10 mg for 4 days (half of 20mg )  She has cleared w/ bariatrics okay to take steroid. She is on PPI 40mg  twice a day as protection from PUD as well. We are using lower dose but longer course.  Use muscle relaxant AS NEEDED as well and conservative therapy.  R Plantar Fasciitis Conservative approach, see AVS Already treating w/ prednisone oral Advice on Tension Night Splint    Meds ordered this encounter  Medications   predniSONE (DELTASONE) 20 MG tablet    Sig: Take 2 tablets daily (40mg ) for 4 days, take 1 tab daily (20mg ) for 4 days, take half tab daily (10mg ) for 4 days    Dispense:  14 tablet    Refill:  0      Follow up plan: Return if symptoms worsen or fail to improve.  Nobie Putnam, [facility] 09/23/2022, 8:52 AM

## 2022-09-26 ENCOUNTER — Encounter: Payer: Self-pay | Admitting: Family Medicine

## 2022-09-26 DIAGNOSIS — G8929 Other chronic pain: Secondary | ICD-10-CM

## 2022-09-26 DIAGNOSIS — M5412 Radiculopathy, cervical region: Secondary | ICD-10-CM

## 2022-09-26 DIAGNOSIS — M542 Cervicalgia: Secondary | ICD-10-CM

## 2022-09-28 MED ORDER — PREDNISONE 20 MG PO TABS: ORAL_TABLET | ORAL | 0 refills | Status: DC

## 2022-11-07 ENCOUNTER — Other Ambulatory Visit: Payer: Self-pay | Admitting: Family Medicine

## 2022-11-07 DIAGNOSIS — I10 Essential (primary) hypertension: Secondary | ICD-10-CM

## 2022-11-09 NOTE — Telephone Encounter (Signed)
Requested Prescriptions  Pending Prescriptions Disp Refills   amLODipine (NORVASC) 5 MG tablet [Pharmacy Med Name: AMLODIPINE BESYLATE 5MG  TABLETS] 90 tablet 1    Sig: TAKE 1 TABLET(5 MG) BY MOUTH DAILY     Cardiovascular: Calcium Channel Blockers 2 Failed - 11/07/2022  3:27 AM      Failed - Last BP in normal range    BP Readings from Last 1 Encounters:  09/23/22 (!) 140/88         Passed - Last Heart Rate in normal range    Pulse Readings from Last 1 Encounters:  09/23/22 90         Passed - Valid encounter within last 6 months    Recent Outpatient Visits           1 month ago Cervical radiculopathy   [place] [facility] [place], Netta Neat, DO   2 months ago Acute viral syndrome   [place] [facility] [place], Netta Neat, DO   4 months ago Annual physical exam   [place] [facility] Smitty Cords, DO   5 months ago Acute non-recurrent frontal sinusitis   [place] [facility] Smitty Cords, DO   6 months ago Aspiration pneumonitis [facility])   [place] [facility] Althea Charon, Netta Neat, DO       Future Appointments             In 1 month Althea Charon, Netta Neat, DO  [facility], [facility]

## 2022-12-10 ENCOUNTER — Other Ambulatory Visit: Payer: Self-pay | Admitting: Family Medicine

## 2022-12-10 NOTE — Telephone Encounter (Signed)
Medication Refill - Medication: pantoprazole (PROTONIX) 40 MG tablet for twice a day specifically   Has the patient contacted their pharmacy? Yes.   (Agent: If no, request that the patient contact the pharmacy for the refill. If patient does not wish to contact the pharmacy document the reason why and proceed with request.) (Agent: If yes, when and what did the pharmacy advise?)  Preferred Pharmacy (with phone number or street name):  [id number] [place], [place] - [address] AT [facility] [address] [facility]  [address] [place] [address]  Phone: [phone] Fax: [phone]   Has the patient been seen for an appointment in the last year OR does the patient have an upcoming appointment? Yes.    Agent: Please be advised that RX refills may take up to 3 business days. We ask that you follow-up with your pharmacy.

## 2022-12-10 NOTE — Telephone Encounter (Signed)
Requested medication (s) are due for refill today:   Provider to review  Requested medication (s) are on the active medication list:   Yes as historical   Future visit scheduled:   Yes   Last ordered: 04/12/2022 as historical     Requested Prescriptions  Pending Prescriptions Disp Refills   pantoprazole (PROTONIX) 40 MG tablet      Sig: Take 1 tablet (40 mg total) by mouth 2 (two) times daily.     Gastroenterology: Proton Pump Inhibitors Passed - 12/10/2022 10:12 AM      Passed - Valid encounter within last 12 months    Recent Outpatient Visits           2 months ago Cervical radiculopathy   [place] [facility] [place], Netta Neat, DO   3 months ago Acute viral syndrome   [place] [facility] [place], Netta Neat, DO   5 months ago Annual physical exam   [place] [facility] Smitty Cords, DO   6 months ago Acute non-recurrent frontal sinusitis   [place] [facility] Smitty Cords, DO   7 months ago Aspiration pneumonitis [facility])   [place] [facility] Althea Charon, Netta Neat, DO       Future Appointments             In 3 weeks Althea Charon, Netta Neat, DO  [facility], [place]

## 2022-12-11 MED ORDER — PANTOPRAZOLE SODIUM 40 MG PO TBEC: 40.0000 mg | DELAYED_RELEASE_TABLET | Freq: Two times a day (BID) | ORAL | 2 refills | Status: DC

## 2023-01-04 ENCOUNTER — Ambulatory Visit (INDEPENDENT_AMBULATORY_CARE_PROVIDER_SITE_OTHER): Payer: 59 | Admitting: Family Medicine

## 2023-01-04 ENCOUNTER — Encounter: Payer: Self-pay | Admitting: Family Medicine

## 2023-01-04 VITALS — BP 138/84 | HR 79 | Temp 98.1°F | Ht 67.0 in | Wt 270.4 lb

## 2023-01-04 DIAGNOSIS — I10 Essential (primary) hypertension: Secondary | ICD-10-CM

## 2023-01-04 DIAGNOSIS — R252 Cramp and spasm: Secondary | ICD-10-CM | POA: Diagnosis not present

## 2023-01-04 DIAGNOSIS — M5412 Radiculopathy, cervical region: Secondary | ICD-10-CM | POA: Diagnosis not present

## 2023-01-04 DIAGNOSIS — E876 Hypokalemia: Secondary | ICD-10-CM

## 2023-01-04 NOTE — Patient Instructions (Addendum)
Thank you for coming to the office today.  Labs for muscle cramping - OTC natural option is Hyland's Leg Cramps (Dissolving tablet) take as needed for muscle cramps  We can refer to Neurology if needed for headaches if not improving.  Please schedule a Follow-up Appointment to: Return in about 6 months (around 07/07/2023) for 6 month fasting lab only then 1 week later Annual Physical.  If you have any other questions or concerns, please feel free to call the office or send a message through MyChart. You may also schedule an earlier appointment if necessary.  Additionally, you may be receiving a survey about your experience at our office within a few days to 1 week by e-mail or mail. We value your feedback.  Saralyn Pilar, DO [facility], [place]

## 2023-01-04 NOTE — Progress Notes (Signed)
Subjective:    Patient ID: Suzanne Wilcox, female    DOB: 20-Sep-1975, 47 y.o.   MRN: [id number]  Suzanne Wilcox is a 47 y.o. female presenting on 01/04/2023 for Hypertension   HPI  Lymphedema Followed by Vascular She acquired Lymphedema pump Off Furosemide  Has upcoming Vascular follow-up Has Lasix at home if needed. Still some swelling worse with heat in summer.   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 S/p cervical disc surgery Not endorsing nausea vomiting migraine symptoms associated Has plenty of muscle relaxants, Methocarbamol Future consider Neurology for headache assessment  Weight loss successful 100 lbs down since 04/2022, and overall >60-70 lbs recently       01/04/2023   10:24 AM 09/23/2022    8:48 AM 07/31/2022    8:46 AM  Depression screen PHQ 2/9  Decreased Interest 1 1 0  Down, Depressed, Hopeless 1 1 0  PHQ - 2 Score 2 2 0  Altered sleeping 3 3 0  Tired, decreased energy 2 1 0  Change in appetite 1 1 0  Feeling bad or failure about yourself  2 1 0  Trouble concentrating 3 2 0  Moving slowly or fidgety/restless 0 0 0  Suicidal thoughts 0 0 0  PHQ-9 Score 13 10 0  Difficult doing work/chores Extremely dIfficult Somewhat difficult Not difficult at all    Social History   Tobacco Use   Smoking status: Former    Packs/day: 1.00    Years: 18.00    Additional pack years: 0.00    Total pack years: 18.00    Types: Cigarettes    Quit date: 04/29/2009    Years since quitting: 13.6   Smokeless tobacco: Never  Vaping Use   Vaping Use: Never used  Substance Use Topics   Alcohol use: Yes    Comment: OCCASIONALLY   Drug use: No    Review of Systems Per HPI unless specifically indicated above      Objective:    BP 138/84 (BP Location: Left Arm, Cuff Size: Normal)   Pulse 79   Temp 98.1 F (36.7 C) (Oral)   Ht 5\' 7"  (1.702 m)   Wt 270 lb 6.4 oz (122.7 kg)   LMP 12/27/2022   SpO2 99%   BMI 42.35 kg/m   Wt Readings from Last 3 Encounters:  01/04/23 270 lb 6.4 oz (122.7 kg)  09/23/22 295 lb 3.2 oz (133.9 kg)  09/04/22 (!) 303 lb (137.4 kg)    Physical Exam Vitals and nursing note reviewed.  Constitutional:      General: She is not in acute distress.    Appearance: She is well-developed. She is obese. She is not diaphoretic.     Comments: Well-appearing, comfortable, cooperative  HENT:     Head: Normocephalic and atraumatic.  Eyes:     General:        Right eye: No discharge.        Left eye: No discharge.     Conjunctiva/sclera: Conjunctivae normal.  Neck:     Thyroid: No thyromegaly.  Cardiovascular:     Rate and Rhythm: Normal rate and regular rhythm.     Heart sounds: Normal heart sounds. No murmur heard. Pulmonary:     Effort: Pulmonary effort is normal. No  respiratory distress.     Breath sounds: Normal breath sounds. No wheezing or rales.  Musculoskeletal:        General: Normal range of motion.     Cervical back: Normal range of motion and neck supple.  Lymphadenopathy:     Cervical: No cervical adenopathy.  Skin:    General: Skin is warm and dry.     Findings: No erythema or rash.  Neurological:     Mental Status: She is alert and oriented to person, place, and time.  Psychiatric:        Behavior: Behavior normal.     Comments: Well groomed, good eye contact, normal speech and thoughts       Results for orders placed or performed during the hospital encounter of 09/04/22  Pregnancy, urine POC  Result Value Ref Range   Preg Test, Ur NEGATIVE NEGATIVE      Assessment & Plan:   Problem List Items Addressed This Visit     Essential hypertension   Other Visit Diagnoses     Muscle cramping    -  Primary   Relevant Orders   BASIC METABOLIC  PANEL WITH GFR   Magnesium   Hypokalemia       Relevant Orders   BASIC METABOLIC PANEL WITH GFR   Hypomagnesemia       Relevant Orders   Magnesium   Cervical radiculopathy           Followed by Pain Management for Neck Pain Discussion on Headaches may be cervicogenic etiology No migraine symptoms We can refer to Neurology if needed for headaches if not improving.  Labs for muscle cramping today K and Mag - OTC natural option is Hyland's Leg Cramps (Dissolving tablet) take as needed for muscle cramps She will use Liquid IV and other remedy as well. Likely related to bariatric surgery nutrient loss.  HYPERTENSION Elevated today  No orders of the defined types were placed in this encounter.     Follow up plan: Return in about 6 months (around 07/07/2023) for 6 month fasting lab only then 1 week later Annual Physical.   Saralyn Pilar, DO [facility] 01/04/2023, 10:39 AM

## 2023-01-05 LAB — BASIC METABOLIC PANEL WITH GFR
BUN: 17 mg/dL (ref 7–25)
CO2: 29 mmol/L (ref 20–32)
Calcium: 9.5 mg/dL (ref 8.6–10.2)
Chloride: 104 mmol/L (ref 98–110)
Creat: 0.75 mg/dL (ref 0.50–0.99)
Glucose, Bld: 100 mg/dL (ref 65–139)
Potassium: 4.2 mmol/L (ref 3.5–5.3)
Sodium: 139 mmol/L (ref 135–146)
eGFR: 99 mL/min/{1.73_m2} (ref >=60)

## 2023-01-05 LAB — MAGNESIUM: Magnesium: 2.3 mg/dL (ref 1.5–2.5)

## 2023-01-25 ENCOUNTER — Ambulatory Visit: Payer: Self-pay | Admitting: *Deleted

## 2023-01-25 NOTE — Telephone Encounter (Signed)
Summary: muscular discomfort / rx req   The patient shares that they are currently traveling and their right side sciatic discomfort has increased as well as shoulder discomfort on their left side  The discomfort has been ongoing for roughly two days  The patient would like to be prescribed something for their discomfort  Please contact the patient further when possible           Chief Complaint: sciatica flare up back, neck left shoulder, requesting medication  Symptoms: nerve pain left shoulder area, neck and sciatica pain . Denies N/T in arm or fingers. Possible from ride in car to go on vacation. Frequency: 2 days  Pertinent Negatives: Patient denies chest pain no difficulty breathing no fever no N/T Disposition: [] ED /[] Urgent Care (no appt availability in office) / [] Appointment(In office/virtual)/ []  [facility]/ [] Home Care/ [x] Refused Recommended Disposition /[] [employer] [facility]/ []  Follow-up with PCP Additional Notes:   Requesting medication for sciatica flare up , steroids. Patient out of town on vacation unable to come in for appt. Recommended UC if needed. Patient would like medication called in to pharmacy [facility] : [address], [place]. [phone]. Please advise      Reason for Disposition  [1] MODERATE pain (e.g., interferes with normal activities) AND [2] present > 3 days    2 days  Answer Assessment - Initial Assessment Questions 1. ONSET: "When did the muscle aches or body pains start?"      2 days ago  2. LOCATION: "What part of your body is hurting?" (e.g., entire body, arms, legs)      Neck , back left shoulder area 3. SEVERITY: "How bad is the pain?" (Scale 1-10; or mild, moderate, severe)   - MILD (1-3): doesn't interfere with normal activities    - MODERATE (4-7): interferes with normal activities or awakens from sleep    - SEVERE (8-10):  excruciating pain, unable to do any normal activities      Worsening  pain since riding in car out of town on vacation 4. CAUSE: "What do you think is causing the pains?"     Sciatica flare up  5. FEVER: "Have you been having fever?"     No  6. OTHER SYMPTOMS: "Do you have any other symptoms?" (e.g., chest pain, weakness, rash, cold or flu symptoms, weight loss)     Nerve flare up right sciatica and left shoulder, neck area 7. PREGNANCY: "Is there any chance you are pregnant?" "When was your last menstrual period?"     na 8. TRAVEL: "Have you traveled out of the country in the last month?" (e.g., travel history, exposures)     na  Protocols used: Muscle Aches and Body Pain-A-AH

## 2023-02-01 DIAGNOSIS — Z713 Dietary counseling and surveillance: Secondary | ICD-10-CM | POA: Diagnosis not present

## 2023-02-01 DIAGNOSIS — Z48815 Encounter for surgical aftercare following surgery on the digestive system: Secondary | ICD-10-CM | POA: Diagnosis not present

## 2023-02-01 DIAGNOSIS — K912 Postsurgical malabsorption, not elsewhere classified: Secondary | ICD-10-CM | POA: Diagnosis not present

## 2023-02-01 DIAGNOSIS — Z87891 Personal history of nicotine dependence: Secondary | ICD-10-CM | POA: Diagnosis not present

## 2023-02-01 DIAGNOSIS — Z9884 Bariatric surgery status: Secondary | ICD-10-CM | POA: Diagnosis not present

## 2023-02-05 ENCOUNTER — Encounter: Payer: Self-pay | Admitting: Family Medicine

## 2023-02-12 ENCOUNTER — Ambulatory Visit (INDEPENDENT_AMBULATORY_CARE_PROVIDER_SITE_OTHER): Payer: 59 | Admitting: Vascular Surgery

## 2023-02-25 ENCOUNTER — Other Ambulatory Visit: Payer: Self-pay | Admitting: Oncology

## 2023-02-25 DIAGNOSIS — Z006 Encounter for examination for normal comparison and control in clinical research program: Secondary | ICD-10-CM

## 2023-02-26 ENCOUNTER — Ambulatory Visit (INDEPENDENT_AMBULATORY_CARE_PROVIDER_SITE_OTHER): Payer: 59 | Admitting: Vascular Surgery

## 2023-03-02 ENCOUNTER — Encounter (INDEPENDENT_AMBULATORY_CARE_PROVIDER_SITE_OTHER): Payer: Self-pay | Admitting: Vascular Surgery

## 2023-03-02 ENCOUNTER — Ambulatory Visit (INDEPENDENT_AMBULATORY_CARE_PROVIDER_SITE_OTHER): Payer: 59 | Admitting: Vascular Surgery

## 2023-03-02 VITALS — BP 142/88 | HR 78 | Resp 18 | Ht 67.0 in | Wt 259.8 lb

## 2023-03-02 DIAGNOSIS — I10 Essential (primary) hypertension: Secondary | ICD-10-CM

## 2023-03-02 DIAGNOSIS — I890 Lymphedema, not elsewhere classified: Secondary | ICD-10-CM | POA: Diagnosis not present

## 2023-03-02 DIAGNOSIS — I83811 Varicose veins of right lower extremities with pain: Secondary | ICD-10-CM

## 2023-03-02 NOTE — Assessment & Plan Note (Signed)
blood pressure control important in reducing the progression of atherosclerotic disease. On appropriate oral medications.  

## 2023-03-02 NOTE — Progress Notes (Signed)
MRN : [id number]  Suzanne Wilcox is a 47 y.o. (26-Oct-1975) female who presents with chief complaint of  Chief Complaint  Patient presents with   Follow-up    f/u in 6 months with no studies -  .  History of Present Illness: Patient returns today in follow up of lymphedema and venous insufficiency.  She is doing quite well.  Her legs have minimal swelling.  She uses her lymphedema pump intermittently when she has symptoms.  She wears compression socks for long trips and other instances but does not have to wear these on a daily basis.  She has continued to have significant weight loss and is down about 60 pounds from her visit 6 months ago.  This has dramatically helped her legs.  She has known mild reflux but we have not treated aggressively because she has had good symptom relief with conservative therapies and weight loss.  Current Outpatient Medications  Medication Sig Dispense Refill   ACETAMINOPHEN-CAFFEINE PO Take 1 capsule by mouth in the morning and at bedtime.     ALPRAZolam (XANAX) 1 MG tablet Take 1 mg by mouth 2 (two) times daily as needed for anxiety.     amLODipine (NORVASC) 5 MG tablet TAKE 1 TABLET(5 MG) BY MOUTH DAILY 90 tablet 1   amphetamine-dextroamphetamine (ADDERALL) 30 MG tablet Take 30 mg by mouth daily.     Biotin 1 MG CAPS Take by mouth.     buPROPion (WELLBUTRIN) 100 MG tablet Take 100 mg by mouth 2 (two) times daily. 2 in the morning and 1 in the afternoon     calcium citrate (CALCITRATE - DOSED IN MG ELEMENTAL CALCIUM) 950 (200 Ca) MG tablet Take 200 mg of elemental calcium by mouth daily.     CAPLYTA 42 MG capsule Take 42 mg by mouth at bedtime.     co-enzyme Q-10 30 MG capsule Take 30 mg by mouth 3 (three) times daily.     Magnesium-Potassium 40-40 MG CAPS Take by mouth.     methocarbamol (ROBAXIN) 750 MG tablet Take 1 tablet (750 mg total) by mouth every 8 (eight) hours as needed for muscle spasms. 90 tablet 1   Multiple Vitamin (MULTI-VITAMIN DAILY PO) Take  by mouth.     nystatin cream (MYCOSTATIN) Apply 1 Application topically 2 (two) times daily.     Omega-3 Fatty Acids (FISH OIL) 300 MG CAPS Take by mouth.     ondansetron (ZOFRAN) 8 MG tablet Take 8 mg by mouth every 8 (eight) hours as needed for nausea or vomiting. Pt has not started medication yet     oxyCODONE-acetaminophen (PERCOCET/ROXICET) 5-325 MG tablet Take 1 tablet by mouth every 4 (four) hours as needed.     pantoprazole (PROTONIX) 40 MG tablet Take 1 tablet (40 mg total) by mouth 2 (two) times daily before a meal. 60 tablet 2   No current facility-administered medications for this visit.    Past Medical History:  Diagnosis Date   Allergy    Anxiety    Bipolar disorder (HCC)    Bipolar disorder (HCC)    Depression    Generalized headaches    GERD (gastroesophageal reflux disease)    Hx of varicella    Hyperlipidemia    Hypertension    Missed abortions    X 6   Obesity 04/29/2012   Postoperative retention of urine 03/28/2016   Postpartum care following cesarean delivery (9/29) 03/27/2016   Sleep apnea    Termination of pregnancy (fetus)  03/22/2004   X 1   Vaginal Pap smear, abnormal     Past Surgical History:  Procedure Laterality Date   CESAREAN SECTION  06/29/2009   X 1 [facility] [place]   CESAREAN SECTION N/A 05/22/2014   Procedure: Repeat CESAREAN SECTION;  Surgeon: Lenoard Aden, MD;  Location: [facility];  Service: Obstetrics;  Laterality: N/A;  EDD: 05/31/14   CESAREAN SECTION WITH BILATERAL TUBAL LIGATION Bilateral 03/27/2016   Procedure: Repeat CESAREAN SECTION WITH BILATERAL TUBAL LIGATION;  Surgeon: Olivia Mackie, MD;  Location: [facility];  Service: Obstetrics;  Laterality: Bilateral;  EDD: 04/01/16   CHOLECYSTECTOMY N/A 04/29/2017   Procedure: LAPAROSCOPIC CHOLECYSTECTOMY WITH INTRAOPERATIVE CHOLANGIOGRAM;  Surgeon: Ovidio Kin, MD;  Location: [facility];  Service: General;  Laterality: N/A;  ERAS PATHWAY   COLONOSCOPY WITH PROPOFOL N/A 09/04/2022    Procedure: COLONOSCOPY WITH PROPOFOL;  Surgeon: Toney Reil, MD;  Location: [facility];  Service: Gastroenterology;  Laterality: N/A;   DILATION AND CURETTAGE OF UTERUS   2005, 2010     x2 [facility]   ESOPHAGOGASTRODUODENOSCOPY N/A 01/11/2017   Procedure: ESOPHAGOGASTRODUODENOSCOPY (EGD);  Surgeon: Kerin Salen, MD;  Location: [facility];  Service: Gastroenterology;  Laterality: N/A;   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   IR GENERIC HISTORICAL  03/26/2016   IR FLUORO GUIDE CV LINE RIGHT 03/26/2016 [facility]   IR GENERIC HISTORICAL  03/26/2016   IR US GUIDE VASC ACCESS RIGHT 03/26/2016 [facility]   WISDOM TOOTH EXTRACTION       Social History   Tobacco Use   Smoking status: Former    Current packs/day: 0.00    Average packs/day: 1 pack/day for 18.0 years (18.0 ttl pk-yrs)    Types: Cigarettes    Start date: 04/30/1991    Quit date: 04/29/2009    Years since quitting: 13.8   Smokeless tobacco: Never  Vaping Use   Vaping status: Never Used  Substance Use Topics   Alcohol use: Yes    Comment: OCCASIONALLY   Drug use: No      Family History  Problem Relation Age of Onset   Depression Mother    Skin cancer Mother    Pancreatic cancer Father    Kidney Stones Father    Stroke Father    Dementia Father    Skin cancer Sister    Depression Paternal Grandmother    Breast cancer Neg Hx      Allergies  Allergen Reactions   Wound Dressing Adhesive Itching, Other (See Comments) and Swelling   Wound Dressings Itching, Other (See Comments) and Swelling   Adhesive [Tape] Rash   Lyrica [Pregabalin] Photosensitivity     REVIEW OF SYSTEMS (Negative unless checked)  Constitutional: [x] Weight loss  [] Fever  [] Chills Cardiac: [] Chest pain   [] Chest pressure   [] Palpitations   [] Shortness of breath when laying flat   [] Shortness of breath at rest   [] Shortness of breath with exertion. Vascular:  [] Pain in legs with walking   [] Pain in legs at rest   [] Pain in legs when laying flat    [] Claudication   [] Pain in feet when walking  [] Pain in feet at rest  [] Pain in feet when laying flat   [] History of DVT   [] Phlebitis   [x] Swelling in legs   [x] Varicose veins   [] Non-healing ulcers Pulmonary:   [] Uses home oxygen   [] Productive cough   [] Hemoptysis   [] Wheeze  [] COPD   [] Asthma Neurologic:  [] Dizziness  [] Blackouts   [] Seizures   []   History of stroke   [] History of TIA  [] Aphasia   [] Temporary blindness   [] Dysphagia   [] Weakness or numbness in arms   [] Weakness or numbness in legs Musculoskeletal:  [] Arthritis   [] Joint swelling   [x] Joint pain   [] Low back pain Hematologic:  [] Easy bruising  [] Easy bleeding   [] Hypercoagulable state   [] Anemic   Gastrointestinal:  [] Blood in stool   [] Vomiting blood  [] Gastroesophageal reflux/heartburn   [] Abdominal pain Genitourinary:  [] Chronic kidney disease   [] Difficult urination  [] Frequent urination  [] Burning with urination   [] Hematuria Skin:  [] Rashes   [] Ulcers   [] Wounds Psychological:  [] History of anxiety   []  History of major depression.  Physical Examination  BP (!) 142/88 (BP Location: Left Arm)   Pulse 78   Resp 18   Ht 5\' 7"  (1.702 m)   Wt 259 lb 12.8 oz (117.8 kg)   BMI 40.69 kg/m  Gen:  WD/WN, NAD Head: [place]/AT, No temporalis wasting. Ear/Nose/Throat: Hearing grossly intact, nares w/o erythema or drainage Eyes: Conjunctiva clear. Sclera non-icteric Neck: Supple.  Trachea midline Pulmonary:  Good air movement, no use of accessory muscles.  Cardiac: RRR, no JVD Vascular: Fairly prominent varicosities in the right calf and medial ankle with the largest measuring about 2 mm in diameter.  Scattered on the left. Vessel Right Left  Radial Palpable Palpable                          PT Palpable Palpable  DP Palpable Palpable   Gastrointestinal: soft, non-tender/non-distended. No guarding/reflex.  Musculoskeletal: M/S 5/5 throughout.  No deformity or atrophy.  No appreciable edema present today. Neurologic:  Sensation grossly intact in extremities.  Symmetrical.  Speech is fluent.  Psychiatric: Judgment intact, Mood & affect appropriate for pt's clinical situation. Dermatologic: No rashes or ulcers noted.  No cellulitis or open wounds.      Labs Recent Results (from the past 2160 hour(s))  BASIC METABOLIC PANEL WITH GFR     Status: None   Collection Time: 01/04/23 10:55 AM  Result Value Ref Range   Glucose, Bld 100 65 - 139 mg/dL    Comment: .        Non-fasting reference interval .    BUN 17 7 - 25 mg/dL   Creat 8.65 7.84 - 6.96 mg/dL   eGFR 99 > OR = 60 EX/BMW/4.13K4   BUN/Creatinine Ratio SEE NOTE: 6 - 22 (calc)    Comment:    Not Reported: BUN and Creatinine are within    reference range. .    Sodium 139 135 - 146 mmol/L   Potassium 4.2 3.5 - 5.3 mmol/L   Chloride 104 98 - 110 mmol/L   CO2 29 20 - 32 mmol/L   Calcium 9.5 8.6 - 10.2 mg/dL  Magnesium     Status: None   Collection Time: 01/04/23 10:55 AM  Result Value Ref Range   Magnesium 2.3 1.5 - 2.5 mg/dL    Radiology No results found.  Assessment/Plan  Lymphedema Symptom control is excellent at this point.  Continue to use her lymphedema pump as needed.  Compression socks and elevation continue.  Plan follow-up on an annual basis at this point.  Essential hypertension blood pressure control important in reducing the progression of atherosclerotic disease. On appropriate oral medications.   Varicose veins of leg with pain, right Mild reflux seen a year ago.  Symptoms much better after weight loss and conservative therapies.  No plan for invasive therapies at this point.  Recheck in 1 year.  We would perform a reflux study if she has worsening symptoms.    Festus Barren, MD  03/02/2023 9:44 AM    This note was created with Dragon medical transcription system.  Any errors from dictation are purely unintentional

## 2023-03-02 NOTE — Assessment & Plan Note (Signed)
Symptom control is excellent at this point.  Continue to use her lymphedema pump as needed.  Compression socks and elevation continue.  Plan follow-up on an annual basis at this point.

## 2023-03-02 NOTE — Assessment & Plan Note (Signed)
Mild reflux seen a year ago.  Symptoms much better after weight loss and conservative therapies.  No plan for invasive therapies at this point.  Recheck in 1 year.  We would perform a reflux study if she has worsening symptoms.

## 2023-04-02 ENCOUNTER — Encounter: Payer: Self-pay | Admitting: Family Medicine

## 2023-04-02 ENCOUNTER — Ambulatory Visit (INDEPENDENT_AMBULATORY_CARE_PROVIDER_SITE_OTHER): Payer: 59 | Admitting: Family Medicine

## 2023-04-02 VITALS — BP 134/74 | HR 78 | Ht 67.0 in | Wt 257.0 lb

## 2023-04-02 DIAGNOSIS — M5020 Other cervical disc displacement, unspecified cervical region: Secondary | ICD-10-CM

## 2023-04-02 DIAGNOSIS — E876 Hypokalemia: Secondary | ICD-10-CM

## 2023-04-02 DIAGNOSIS — M542 Cervicalgia: Secondary | ICD-10-CM | POA: Diagnosis not present

## 2023-04-02 DIAGNOSIS — L209 Atopic dermatitis, unspecified: Secondary | ICD-10-CM | POA: Diagnosis not present

## 2023-04-02 DIAGNOSIS — G8929 Other chronic pain: Secondary | ICD-10-CM | POA: Insufficient documentation

## 2023-04-02 DIAGNOSIS — L720 Epidermal cyst: Secondary | ICD-10-CM

## 2023-04-02 DIAGNOSIS — R252 Cramp and spasm: Secondary | ICD-10-CM

## 2023-04-02 MED ORDER — POTASSIUM CHLORIDE CRYS ER 20 MEQ PO TBCR
20.0000 meq | EXTENDED_RELEASE_TABLET | Freq: Every day | ORAL | 1 refills | Status: DC
Start: 2023-04-02 — End: 2023-07-22

## 2023-04-02 MED ORDER — OXYCODONE-ACETAMINOPHEN 5-325 MG PO TABS: 1.0000 | ORAL_TABLET | ORAL | 0 refills | Status: DC | PRN

## 2023-04-02 MED ORDER — TRIAMCINOLONE ACETONIDE 0.5 % EX CREA: 1.0000 | TOPICAL_CREAM | Freq: Two times a day (BID) | CUTANEOUS | 0 refills | Status: DC

## 2023-04-02 NOTE — Progress Notes (Signed)
Subjective:    Patient ID: Suzanne Wilcox, female    DOB: 05/02/1976, 47 y.o.   MRN: [id number]  Suzanne Wilcox is a 47 y.o. female presenting on 04/02/2023 for Rash (Developed about 1-2 weeks ago on the back/Constant itching, there is a cyst like lump ) and Back Pain   HPI  Discussed the use of AI scribe software for clinical note transcription with the patient, who gave verbal consent to proceed.    Rash on Back Itchy spot for >20 years Larger 2+ months lump under skin that is itching It has raised and also gone down New problem 2 weeks - Off to the side also had a rash with small bumps that has been itchy Skin appears discolored Tried Hydrocortisone and Eczema cream, [place] + Lidocaine topical Some days good and others   Chronic Pain Neck Pain, Herniated disc Cervical R Shoulder pain radiating into head neck  Previously with Previously w [facility] / Pain Management /  Neurosurgery / Spine specialist managing on opioid but they cannot manage pain long term. She was sent to pain clinic and did not have a good experience, prefers to manage with PCP now. She takes Oxycodone-Acetaminophen 5/325 AS NEEDED 60 tab since June. She takes intermittent only, not daily basis. She is avoiding mixing Alprazolam and Percocet Cannot take NSAID Advil due to history of bariatric surgery due to contraindication to avoid PUD risk  The patient also reported persistent muscle cramps in the legs at night, which have not improved significantly despite taking a potassium and magnesium supplement. The cramps are described as atypical, often occurring in the foot or up the shins, rather than presenting as a typical charley horse. The patient has tried various interventions, including drinking a liquid IV mid-spasm, but the cramps persist.       04/02/2023    9:06 AM 01/04/2023   10:24 AM 09/23/2022    8:48 AM  Depression screen PHQ 2/9  Decreased Interest 1 1 1   Down, Depressed, Hopeless 0 1  1  PHQ - 2 Score 1 2 2   Altered sleeping 3 3 3   Tired, decreased energy 1 2 1   Change in appetite 1 1 1   Feeling bad or failure about yourself  3 2 1   Trouble concentrating 3 3 2   Moving slowly or fidgety/restless 1 0 0  Suicidal thoughts 0 0 0  PHQ-9 Score 13 13 10   Difficult doing work/chores Very difficult Extremely dIfficult Somewhat difficult    Social History   Tobacco Use   Smoking status: Former    Current packs/day: 0.00    Average packs/day: 1 pack/day for 18.0 years (18.0 ttl pk-yrs)    Types: Cigarettes    Start date: 04/30/1991    Quit date: 04/29/2009    Years since quitting: 13.9   Smokeless tobacco: Never  Vaping Use   Vaping status: Never Used  Substance Use Topics   Alcohol use: Yes    Comment: OCCASIONALLY   Drug use: No    Review of Systems Per HPI unless specifically indicated above     Objective:    BP 134/74   Pulse 78   Ht 5\' 7"  (1.702 m)   Wt 257 lb (116.6 kg)   SpO2 100%   BMI 40.25 kg/m   Wt Readings from Last 3 Encounters:  04/02/23 257 lb (116.6 kg)  03/02/23 259 lb 12.8 oz (117.8 kg)  01/04/23 270 lb 6.4 oz (122.7 kg)    Physical  Exam Vitals and nursing note reviewed.  Constitutional:      General: She is not in acute distress.    Appearance: Normal appearance. She is well-developed. She is not diaphoretic.     Comments: Well-appearing, comfortable, cooperative  HENT:     Head: Normocephalic and atraumatic.  Eyes:     General:        Right eye: No discharge.        Left eye: No discharge.     Conjunctiva/sclera: Conjunctivae normal.  Neck:     Comments: Reduced ROM neck Cardiovascular:     Rate and Rhythm: Normal rate.  Pulmonary:     Effort: Pulmonary effort is normal.  Skin:    General: Skin is warm and dry.     Findings: Lesion (Left upper back / shoulder posterior lipoma like density nodular sub-Q 1-2 cm) and rash (Left shoulder see photo posterior) present. No erythema.  Neurological:     Mental Status: She is  alert and oriented to person, place, and time.  Psychiatric:        Mood and Affect: Mood normal.        Behavior: Behavior normal.        Thought Content: Thought content normal.     Comments: Well groomed, good eye contact, normal speech and thoughts     Left Shoulder posterior / back    Results for orders placed or performed in visit on 01/04/23  BASIC METABOLIC PANEL WITH GFR  Result Value Ref Range   Glucose, Bld 100 65 - 139 mg/dL   BUN 17 7 - 25 mg/dL   Creat 1.61 0.96 - 0.45 mg/dL   eGFR 99 > OR = 60 WU/JWJ/1.91Y7   BUN/Creatinine Ratio SEE NOTE: 6 - 22 (calc)   Sodium 139 135 - 146 mmol/L   Potassium 4.2 3.5 - 5.3 mmol/L   Chloride 104 98 - 110 mmol/L   CO2 29 20 - 32 mmol/L   Calcium 9.5 8.6 - 10.2 mg/dL  Magnesium  Result Value Ref Range   Magnesium 2.3 1.5 - 2.5 mg/dL      Assessment & Plan:   Problem List Items Addressed This Visit     Chronic neck pain   Relevant Medications   oxyCODONE-acetaminophen (PERCOCET/ROXICET) 5-325 MG tablet   Herniated cervical intervertebral disc   Relevant Medications   oxyCODONE-acetaminophen (PERCOCET/ROXICET) 5-325 MG tablet   Other Visit Diagnoses     Atopic dermatitis, unspecified type    -  Primary   Relevant Medications   triamcinolone cream (KENALOG) 0.5 %   Milia       Muscle cramp       Hypokalemia       Relevant Medications   potassium chloride SA (KLOR-CON M) 20 MEQ tablet       Assessment and Plan    Back Rash - suspected Milia or benign rash. Chronic itchiness in the same area for over 20 years with recent development of a lump and a rash with small bumps. The lump is likely a lipoma or a deep cyst. The rash is likely milia. The skin discoloration is likely related to the rash. -Continue with skincare and exfoliation techniques. -Prescribe a stronger cortisone cream for itch relief - Triamcinolone 0.5%  Herniated Cervical Disc Chronic Pain [facility] longer [facility]. Since no  further procedure scheduled. Chronic pain managed with intermittent use of Percocet. I have agreed to manage her pain longer term with intermittent orders only. She is  not taking daily pain management. Reviewed rx guidelines for controlled opioid and have printed letter and she has reviewed and signed the pain contract  -Prescribe 30 tablets of Oxycodone 5/325mg  to be taken every 4 hours as needed for pain. -Schedule a follow-up appointment in 3 months.  Muscle Cramps Persistent muscle cramps in the legs at night despite taking potassium and magnesium supplements. Potassium levels are slightly low. -Prescribe 20 milliequivalents of potassium to be taken once daily. -Continue with current magnesium supplement. -Consider switching the timing of the magnesium supplement to morning.  General Health Maintenance -Schedule annual check-up in January.       Meds ordered this encounter  Medications   triamcinolone cream (KENALOG) 0.5 %    Sig: Apply 1 Application topically 2 (two) times daily. To affected areas, for up to 2 weeks.    Dispense:  30 g    Refill:  0   oxyCODONE-acetaminophen (PERCOCET/ROXICET) 5-325 MG tablet    Sig: Take 1 tablet by mouth every 4 (four) hours as needed for moderate pain.    Dispense:  60 tablet    Refill:  0   potassium chloride SA (KLOR-CON M) 20 MEQ tablet    Sig: Take 1 tablet (20 mEq total) by mouth daily.    Dispense:  90 tablet    Refill:  1      Follow up plan: Return in about 3 months (around 07/03/2023).    Saralyn Pilar, DO [facility] 04/02/2023, 9:52 AM

## 2023-04-02 NOTE — Patient Instructions (Signed)
Thank you for coming to the office today.    Please schedule a Follow-up Appointment to: No follow-ups on file.  If you have any other questions or concerns, please feel free to call the office or send a message through MyChart. You may also schedule an earlier appointment if necessary.  Additionally, you may be receiving a survey about your experience at our office within a few days to 1 week by e-mail or mail. We value your feedback.  Naira Standiford, DO [facility] 

## 2023-04-14 ENCOUNTER — Ambulatory Visit: Payer: 59 | Admitting: Family Medicine

## 2023-04-20 ENCOUNTER — Other Ambulatory Visit
Admission: RE | Admit: 2023-04-20 | Discharge: 2023-04-20 | Disposition: A | Discharge status: Home / Self Care | Payer: 59 | Source: Ambulatory Visit | Attending: Oncology | Admitting: Oncology

## 2023-04-20 DIAGNOSIS — Z006 Encounter for examination for normal comparison and control in clinical research program: Secondary | ICD-10-CM | POA: Insufficient documentation

## 2023-04-22 DIAGNOSIS — Z713 Dietary counseling and surveillance: Secondary | ICD-10-CM | POA: Diagnosis not present

## 2023-04-22 DIAGNOSIS — K912 Postsurgical malabsorption, not elsewhere classified: Secondary | ICD-10-CM | POA: Diagnosis not present

## 2023-04-22 DIAGNOSIS — Z9884 Bariatric surgery status: Secondary | ICD-10-CM | POA: Diagnosis not present

## 2023-04-22 DIAGNOSIS — Z48815 Encounter for surgical aftercare following surgery on the digestive system: Secondary | ICD-10-CM | POA: Diagnosis not present

## 2023-04-27 ENCOUNTER — Ambulatory Visit: Payer: Self-pay

## 2023-04-27 NOTE — Telephone Encounter (Signed)
  Chief Complaint: Ear pain - cracking in ear Symptoms: Above Frequency: last night Pertinent Negatives: Patient denies fever, dizziness Disposition: [] ED /[] Urgent Care (no appt availability in office) / [x] Appointment(In office/virtual)/ []  [facility]/ [] Home Care/ [] Refused Recommended Disposition /[] [facility]/ []  Follow-up with PCP Additional Notes: Pt was blowing her nose last night. She heard a pop in her right ear, followed by a very sharp pain. Sharp pain has resolved, but pt still has an achy constant pain. Pt states she has cracking in that ear, and the sharp pain returns when she blows her nose or sneezes. Appt for tomorrow at [facility].    Answer Assessment - Initial Assessment Questions 1. LOCATION: "Which ear is involved?"     right 2. ONSET: "When did the ear start hurting"      Late last night 3. SEVERITY: "How bad is the pain?"  (Scale 1-10; mild, moderate or severe)   - MILD (1-3): doesn't interfere with normal activities    - MODERATE (4-7): interferes with normal activities or awakens from sleep    - SEVERE (8-10): excruciating pain, unable to do any normal activities      Achy constant 4. URI SYMPTOMS: "Do you have a runny nose or cough?"     yes 5. FEVER: "Do you have a fever?" If Yes, ask: "What is your temperature, how was it measured, and when did it start?"     no 6. CAUSE: "Have you been swimming recently?", "How often do you use Q-TIPS?", "Have you had any recent air travel or scuba diving?"     no 7. OTHER SYMPTOMS: "Do you have any other symptoms?" (e.g., headache, stiff neck, dizziness, vomiting, runny nose, decreased hearing)     congestion  Protocols used: Earache-A-AH

## 2023-04-28 ENCOUNTER — Ambulatory Visit (INDEPENDENT_AMBULATORY_CARE_PROVIDER_SITE_OTHER): Payer: 59 | Admitting: Family Medicine

## 2023-04-28 ENCOUNTER — Ambulatory Visit: Payer: 59 | Admitting: Physician Assistant

## 2023-04-28 VITALS — BP 132/80 | HR 93 | Resp 16 | Ht 67.0 in | Wt 257.0 lb

## 2023-04-28 DIAGNOSIS — H9201 Otalgia, right ear: Secondary | ICD-10-CM | POA: Diagnosis not present

## 2023-04-28 DIAGNOSIS — T700XXA Otitic barotrauma, initial encounter: Secondary | ICD-10-CM

## 2023-04-28 DIAGNOSIS — H60501 Unspecified acute noninfective otitis externa, right ear: Secondary | ICD-10-CM | POA: Diagnosis not present

## 2023-04-28 DIAGNOSIS — H6121 Impacted cerumen, right ear: Secondary | ICD-10-CM

## 2023-04-28 MED ORDER — CIPROFLOXACIN-DEXAMETHASONE 0.3-0.1 % OT SUSP: 4.0000 [drp] | Freq: Two times a day (BID) | OTIC | 0 refills | Status: DC

## 2023-04-28 NOTE — Progress Notes (Signed)
Patient ID: Suzanne Wilcox, female    DOB: Jun 16, 1976, 47 y.o.   MRN: [id number]  PCP: Smitty Cords, DO  Chief Complaint  Patient presents with   Ear Pain    R, x3 days. Feeling better    Subjective:   Suzanne Wilcox is a 47 y.o. female, presents to clinic with CC of the following:  HPI   Acute severe right ear pain happened after she did a sinus spray and then blew her nose, she has noted ear pain and pressure since then x 3 days, decreased hearing, felt like pressure and sounded like fluid draining from ear and a sensation of it going down neck Ear pain is a little better today She has seen ENT [place] for posterior oropharynx congestion and excessive secretions - Dr. Andee Poles encouraged her to use Xlear sinus spray - she uses occasionally when having increased OP or nasopharyngeal congestion She denies recent URI sx No allergy meds or other sinus sprays Denies fever/chills/sweats   Patient Active Problem List   Diagnosis Date Noted   Chronic neck pain 04/02/2023   Lymphedema 03/02/2023   Varicose veins of leg with pain, right 03/02/2023   Encounter for screening colonoscopy 09/04/2022   Essential hypertension 10/29/2021   Bilateral lower extremity edema 10/29/2021   Class 3 severe obesity due to excess calories without serious comorbidity with body mass index (BMI) of 50.0 to 59.9 in adult (HCC) 10/29/2021   OSA (obstructive sleep apnea) 10/29/2021   Attention deficit hyperactivity disorder (ADHD), combined type 10/29/2021   GAD (generalized anxiety disorder) 10/29/2021   Excessive daytime sleepiness 12/16/2020   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   Herniated cervical intervertebral disc 06/28/2017   Fatty (change of) liver, not elsewhere classified 12/27/2016   Postoperative retention of urine 03/28/2016   Gallbladder disease 04/30/2015   Severe bipolar I disorder, current or most recent episode depressed (HCC) 05/11/2012   OCD (obsessive compulsive  disorder) 05/11/2012   Substance abuse (HCC) 05/11/2012      Current Outpatient Medications:    ACETAMINOPHEN-CAFFEINE PO, Take 1 capsule by mouth in the morning and at bedtime., Disp: , Rfl:    ALPRAZolam (XANAX) 1 MG tablet, Take 1 mg by mouth 2 (two) times daily as needed for anxiety., Disp: , Rfl:    amLODipine (NORVASC) 5 MG tablet, TAKE 1 TABLET(5 MG) BY MOUTH DAILY, Disp: 90 tablet, Rfl: 1   amphetamine-dextroamphetamine (ADDERALL) 30 MG tablet, Take 30 mg by mouth daily., Disp: , Rfl:    Biotin 1 MG CAPS, Take by mouth., Disp: , Rfl:    buPROPion (WELLBUTRIN) 100 MG tablet, Take 100 mg by mouth 2 (two) times daily. 2 in the morning and 1 in the afternoon, Disp: , Rfl:    calcium citrate (CALCITRATE - DOSED IN MG ELEMENTAL CALCIUM) 950 (200 Ca) MG tablet, Take 200 mg of elemental calcium by mouth daily., Disp: , Rfl:    CAPLYTA 42 MG capsule, Take 42 mg by mouth at bedtime., Disp: , Rfl:    co-enzyme Q-10 30 MG capsule, Take 30 mg by mouth 3 (three) times daily., Disp: , Rfl:    Iron-Vitamin C (VITRON-C) 65-125 MG TABS, Take by mouth., Disp: , Rfl:    Magnesium-Potassium 40-40 MG CAPS, Take by mouth., Disp: , Rfl:    methocarbamol (ROBAXIN) 750 MG tablet, Take 1 tablet (750 mg total) by mouth every 8 (eight) hours as needed for muscle spasms., Disp: 90 tablet, Rfl: 1   Multiple Vitamin (  MULTI-VITAMIN DAILY PO), Take by mouth., Disp: , Rfl:    nystatin cream (MYCOSTATIN), Apply 1 Application topically 2 (two) times daily., Disp: , Rfl:    Omega-3 Fatty Acids (FISH OIL) 300 MG CAPS, Take by mouth., Disp: , Rfl:    ondansetron (ZOFRAN) 8 MG tablet, Take 8 mg by mouth every 8 (eight) hours as needed for nausea or vomiting. Pt has not started medication yet, Disp: , Rfl:    oxyCODONE-acetaminophen (PERCOCET/ROXICET) 5-325 MG tablet, Take 1 tablet by mouth every 4 (four) hours as needed for moderate pain., Disp: 60 tablet, Rfl: 0   pantoprazole (PROTONIX) 40 MG tablet, Take 1 tablet (40 mg  total) by mouth 2 (two) times daily before a meal., Disp: 60 tablet, Rfl: 2   potassium chloride SA (KLOR-CON M) 20 MEQ tablet, Take 1 tablet (20 mEq total) by mouth daily., Disp: 90 tablet, Rfl: 1   triamcinolone cream (KENALOG) 0.5 %, Apply 1 Application topically 2 (two) times daily. To affected areas, for up to 2 weeks., Disp: 30 g, Rfl: 0   Allergies  Allergen Reactions   Wound Dressing Adhesive Itching, Other (See Comments) and Swelling   Wound Dressings Itching, Other (See Comments) and Swelling   Adhesive [Tape] Rash   Lyrica [Pregabalin] Photosensitivity     Social History   Tobacco Use   Smoking status: Former    Current packs/day: 0.00    Average packs/day: 1 pack/day for 18.0 years (18.0 ttl pk-yrs)    Types: Cigarettes    Start date: 04/30/1991    Quit date: 04/29/2009    Years since quitting: 14.0   Smokeless tobacco: Never  Vaping Use   Vaping status: Never Used  Substance Use Topics   Alcohol use: Yes    Comment: OCCASIONALLY   Drug use: No      Chart Review Today: I personally reviewed active problem list, medication list, allergies, family history, social history, health maintenance, notes from last encounter, lab results, imaging with the patient/caregiver today.   Review of Systems  Constitutional: Negative.   HENT: Negative.    Eyes: Negative.   Respiratory: Negative.    Cardiovascular: Negative.   Gastrointestinal: Negative.   Endocrine: Negative.   Genitourinary: Negative.   Musculoskeletal: Negative.   Skin: Negative.   Allergic/Immunologic: Negative.   Neurological: Negative.   Hematological: Negative.   Psychiatric/Behavioral: Negative.    All other systems reviewed and are negative.      Objective:   Vitals:   04/28/23 0919  BP: 132/80  Pulse: 93  Resp: 16  SpO2: 97%  Weight: 257 lb (116.6 kg)  Height: 5\' 7"  (1.702 m)    Body mass index is 40.25 kg/m.  Physical Exam Vitals and nursing note reviewed.  Constitutional:       General: She is not in acute distress.    Appearance: Normal appearance. She is well-developed. She is obese. She is not ill-appearing, toxic-appearing or diaphoretic.     Comments: She appears uncomfortable, but NAD  HENT:     Head: Normocephalic and atraumatic.     Right Ear: There is impacted cerumen (most of TM obstructed by veil of wax). Tympanic membrane is erythematous.     Left Ear: Tympanic membrane, ear canal and external ear normal. No tenderness.  No middle ear effusion. There is no impacted cerumen. Tympanic membrane is not injected, scarred, perforated, erythematous, retracted or bulging.     Ears:     Comments: Right ear visible portions of canal erythematous/injected  Visible portion of TM erythematous, landmarks are not visible with obstructing wax No drainage or severe edema noted in canal     Nose: Congestion and rhinorrhea present. Rhinorrhea is clear.     Mouth/Throat:     Lips: Pink.     Mouth: Mucous membranes are moist.     Pharynx: Oropharynx is clear. Uvula midline.  Eyes:     General:        Right eye: No discharge.        Left eye: No discharge.     Conjunctiva/sclera: Conjunctivae normal.  Neck:     Trachea: No tracheal deviation.  Cardiovascular:     Rate and Rhythm: Normal rate and regular rhythm.  Pulmonary:     Effort: Pulmonary effort is normal. No respiratory distress.     Breath sounds: No stridor.  Musculoskeletal:        General: Normal range of motion.  Skin:    General: Skin is warm and dry.     Findings: No rash.  Neurological:     Mental Status: She is alert.     Motor: No abnormal muscle tone.     Coordination: Coordination normal.  Psychiatric:        Behavior: Behavior normal.      Results for orders placed or performed in visit on 01/04/23  BASIC METABOLIC PANEL WITH GFR  Result Value Ref Range   Glucose, Bld 100 65 - 139 mg/dL   BUN 17 7 - 25 mg/dL   Creat 4.09 8.11 - 9.14 mg/dL   eGFR 99 > OR = 60 NW/GNF/6.21H0    BUN/Creatinine Ratio SEE NOTE: 6 - 22 (calc)   Sodium 139 135 - 146 mmol/L   Potassium 4.2 3.5 - 5.3 mmol/L   Chloride 104 98 - 110 mmol/L   CO2 29 20 - 32 mmol/L   Calcium 9.5 8.6 - 10.2 mg/dL  Magnesium  Result Value Ref Range   Magnesium 2.3 1.5 - 2.5 mg/dL   Indication: Cerumen impaction of the ear  Medical necessity statement: On physical examination, cerumen impairs clinically significant portions of the external auditory canal, and tympanic membrane. Noted obstructive, copious cerumen that cannot be removed without magnification and instrumentations requiring MD/APP skills/procedure  Consent: Discussed benefits and risks of procedure and verbal consent obtained  Procedure:  Cerumen Disimpaction  Patient was prepped for the procedure.  Utilized an otoscope to assess and take note of the ear canal, the tympanic membrane, and the presence, amount, and placement of the cerumen.  Gentle ear lavage with warm water and hydrogen peroxide performed on the right ear.    Soft plastic curette was also utilized to remove cerumen by myself with direct visualization.   Post procedure examination: shows cerumen was partially removed. Patient had pain and procedure was stopped prematurely (see below).  The patient is made aware that they may experience temporary vertigo, temporary hearing loss, and temporary discomfort.  If these symptom last for more than 24 hours to follow up in clinic.     Assessment & Plan:   Pt with right otalgia x 3 days after doing a nasal spray/rinse and blowing nose - we discussed possible otitis externa or ETD and treatment.  I explained I could not see her entire TM so it was difficult to tell there was fluid or infection in middle ear.  She did wish for irrigation to remove wax - we discussed procedure and risk/SE and she wished to proceed.  I did discuss  possible discomfort due to the erythema noted and encouraged her to tell us to stop if any severe pain    Procedure was started by CMA and pt reported pain and procedure was stopped immediately  I reexamined her after More of TM was visible, TM was injected/erythematous, I did not see a perforation,TM looked inflamed and landmarks not all visible, did not appear bulging or to have purulence in inner ear. Possibly some barotrauma vs ETD + otitis externa  Tx with ciprodex, she can try decongestants, antihistamines and intranasal steroid sprays.  No oral abx at this time Tx pain with tylenol/ibuprofen (if tolerated) Encouraged her to get TM recheck in about 5-7 days  1. Right ear pain  - ciprofloxacin-dexamethasone (CIPRODEX) OTIC suspension; Place 4 drops into the right ear 2 (two) times daily.  Dispense: 7.5 mL; Refill: 0  2. Acute otitis externa of right ear, unspecified type  - ciprofloxacin-dexamethasone (CIPRODEX) OTIC suspension; Place 4 drops into the right ear 2 (two) times daily.  Dispense: 7.5 mL; Refill: 0  3. Otitic barotrauma of right ear  - ciprofloxacin-dexamethasone (CIPRODEX) OTIC suspension; Place 4 drops into the right ear 2 (two) times daily.  Dispense: 7.5 mL; Refill: 0  4. Impacted cerumen of right ear  - Ear Lavage   F/up for ear/TM recheck in 5-7 d  Danelle Berry, PA-C 04/28/23 9:39 AM

## 2023-05-03 ENCOUNTER — Ambulatory Visit (INDEPENDENT_AMBULATORY_CARE_PROVIDER_SITE_OTHER): Payer: 59 | Admitting: Family Medicine

## 2023-05-03 ENCOUNTER — Encounter: Payer: Self-pay | Admitting: Family Medicine

## 2023-05-03 VITALS — BP 136/78 | HR 90 | Wt 253.0 lb

## 2023-05-03 DIAGNOSIS — J0110 Acute frontal sinusitis, unspecified: Secondary | ICD-10-CM

## 2023-05-03 DIAGNOSIS — B379 Candidiasis, unspecified: Secondary | ICD-10-CM | POA: Diagnosis not present

## 2023-05-03 DIAGNOSIS — R059 Cough, unspecified: Secondary | ICD-10-CM

## 2023-05-03 MED ORDER — IPRATROPIUM BROMIDE 0.06 % NA SOLN: 2.0000 | Freq: Four times a day (QID) | NASAL | 0 refills | Status: DC

## 2023-05-03 MED ORDER — AMOXICILLIN-POT CLAVULANATE 875-125 MG PO TABS: 1.0000 | ORAL_TABLET | Freq: Two times a day (BID) | ORAL | 0 refills | Status: DC

## 2023-05-03 MED ORDER — GUAIFENESIN-CODEINE 100-10 MG/5ML PO SYRP: 5.0000 mL | ORAL_SOLUTION | Freq: Every evening | ORAL | 0 refills | Status: DC | PRN

## 2023-05-03 MED ORDER — FLUCONAZOLE 150 MG PO TABS: ORAL_TABLET | ORAL | 0 refills | Status: DC

## 2023-05-03 MED ORDER — BENZONATATE 100 MG PO CAPS: 100.0000 mg | ORAL_CAPSULE | Freq: Three times a day (TID) | ORAL | 0 refills | Status: DC | PRN

## 2023-05-03 NOTE — Patient Instructions (Addendum)

## 2023-05-03 NOTE — Progress Notes (Signed)
Subjective:    Patient ID: Suzanne Wilcox, female    DOB: 06/19/1976, 47 y.o.   MRN: [id number]  Suzanne Wilcox is a 47 y.o. female presenting on 05/03/2023 for Ear Pain (Follow up, pain has improved but still present), Cough, and Generalized Body Aches   HPI  Discussed the use of AI scribe software for clinical note transcription with the patient, who gave verbal consent to proceed.      Right Ear Pain / Pressure Sinusitis  Reports last week 04/26/23 acute ear pain and pressure and fluid crackling, she took advil and did hot compress, still had pain pressure in R ear. She had muffled loss of hearing associated. She did the walk in visit on 10/30, at that time, they removed some ear wax at that time. She was found to have erythematous R ear drum and bulging. Treated with Ciprodex ear drops for otitis externa and ear lavage.  Today now 6 days later, she says R ear pain is improved 80%. She says other symptoms worsened with sinus congestion drainage, now some mild achy symptoms Left ear. Admits some sinus drainage thicker green gray phlegm and lower chest congestion. Admits mild sore throat over weekend. Some improvement. Taking Ibuprofen advil and already on PPI asking about taking extra dose of PPI to protect stomach - Admits body aches but without fever. Denies nausea vomiting diarrhea dyspnea      05/03/2023    9:23 AM 04/28/2023    9:19 AM 04/02/2023    9:06 AM  Depression screen PHQ 2/9  Decreased Interest 2 1 1   Down, Depressed, Hopeless 2 0 0  PHQ - 2 Score 4 1 1   Altered sleeping 3  3  Tired, decreased energy 3  1  Change in appetite 1  1  Feeling bad or failure about yourself  2  3  Trouble concentrating 3  3  Moving slowly or fidgety/restless 0  1  Suicidal thoughts 0  0  PHQ-9 Score 16  13  Difficult doing work/chores Very difficult  Very difficult    Social History   Tobacco Use   Smoking status: Former    Current packs/day: 0.00    Average packs/day: 1  pack/day for 18.0 years (18.0 ttl pk-yrs)    Types: Cigarettes    Start date: 04/30/1991    Quit date: 04/29/2009    Years since quitting: 14.0   Smokeless tobacco: Never  Vaping Use   Vaping status: Never Used  Substance Use Topics   Alcohol use: Yes    Comment: OCCASIONALLY   Drug use: No    Review of Systems Per HPI unless specifically indicated above     Objective:    BP 136/78   Pulse 90   Wt 253 lb (114.8 kg)   SpO2 98%   BMI 39.63 kg/m   Wt Readings from Last 3 Encounters:  05/03/23 253 lb (114.8 kg)  04/28/23 257 lb (116.6 kg)  04/02/23 257 lb (116.6 kg)    Physical Exam Vitals and nursing note reviewed.  Constitutional:      General: She is not in acute distress.    Appearance: She is well-developed. She is not diaphoretic.     Comments: Well-appearing, comfortable, cooperative  HENT:     Head: Normocephalic and atraumatic.     Right Ear: Ear canal and external ear normal. There is no impacted cerumen.     Left Ear: Ear canal and external ear normal. There is no impacted cerumen.  Ears:     Comments: No erythema TMs no bulging. Residual effusion L>R TM Eyes:     General:        Right eye: No discharge.        Left eye: No discharge.     Conjunctiva/sclera: Conjunctivae normal.  Neck:     Thyroid: No thyromegaly.  Cardiovascular:     Rate and Rhythm: Normal rate and regular rhythm.     Heart sounds: Normal heart sounds. No murmur heard. Pulmonary:     Effort: Pulmonary effort is normal. No respiratory distress.     Breath sounds: Normal breath sounds. No wheezing or rales.  Musculoskeletal:        General: Normal range of motion.     Cervical back: Normal range of motion and neck supple.     Right lower leg: No edema.     Left lower leg: No edema.  Lymphadenopathy:     Cervical: No cervical adenopathy.  Skin:    General: Skin is warm and dry.     Findings: No erythema or rash.  Neurological:     Mental Status: She is alert and oriented to  person, place, and time.  Psychiatric:        Behavior: Behavior normal.     Comments: Well groomed, good eye contact, normal speech and thoughts      Results for orders placed or performed in visit on 01/04/23  BASIC METABOLIC PANEL WITH GFR  Result Value Ref Range   Glucose, Bld 100 65 - 139 mg/dL   BUN 17 7 - 25 mg/dL   Creat 9.14 7.82 - 9.56 mg/dL   eGFR 99 > OR = 60 OZ/HYQ/6.57Q4   BUN/Creatinine Ratio SEE NOTE: 6 - 22 (calc)   Sodium 139 135 - 146 mmol/L   Potassium 4.2 3.5 - 5.3 mmol/L   Chloride 104 98 - 110 mmol/L   CO2 29 20 - 32 mmol/L   Calcium 9.5 8.6 - 10.2 mg/dL  Magnesium  Result Value Ref Range   Magnesium 2.3 1.5 - 2.5 mg/dL      Assessment & Plan:   Problem List Items Addressed This Visit   None Visit Diagnoses     Acute non-recurrent frontal sinusitis    -  Primary   Relevant Medications   ipratropium (ATROVENT) 0.06 % nasal spray   benzonatate (TESSALON) 100 MG capsule   guaiFENesin-codeine (ROBITUSSIN AC) 100-10 MG/5ML syrup   amoxicillin-clavulanate (AUGMENTIN) 875-125 MG tablet   fluconazole (DIFLUCAN) 150 MG tablet   Cough, unspecified type       Relevant Medications   benzonatate (TESSALON) 100 MG capsule   guaiFENesin-codeine (ROBITUSSIN AC) 100-10 MG/5ML syrup   Antibiotic-induced yeast infection       Relevant Medications   fluconazole (DIFLUCAN) 150 MG tablet      Assessment and Plan    Right Ear Pain / Otitis Externa vs Media Onset >1 week ago already treated w/ Ciprodex for 5+ days per walk in clinic Improved by 80% after using steroid and antibiotic ear drops. No current signs of infection on examination. Possible residual fluid behind the eardrum. -Continue current treatment plan.  Sinusitis New onset of sinus congestion, cough, and chest discomfort. No signs of lower respiratory infection on examination. Likely sinusitis with postnasal drip. Cannot rule out flu or covid viral syndrome, now duration >5+ days, decline testing  today  -Start Tessalon Perles for daytime cough suppression. -Start Codeine cough syrup for nighttime cough suppression. -Start Atrovent nasal  spray for decongestion.  Back Up plan only if not improved >7 days symptoms  - -Print prescription for Augmentin + Diflucan as a backup plan if symptoms worsen.  Gastric Discomfort Possible irritation from Advil use. Currently on Pantoprazole twice daily. -Continue Pantoprazole. Add over-the-counter Gaviscon as needed for additional stomach protection.     Meds ordered this encounter  Medications   ipratropium (ATROVENT) 0.06 % nasal spray    Sig: Place 2 sprays into both nostrils 4 (four) times daily. For up to 5-7 days then stop.    Dispense:  15 mL    Refill:  0   benzonatate (TESSALON) 100 MG capsule    Sig: Take 1 capsule (100 mg total) by mouth 3 (three) times daily as needed for cough.    Dispense:  30 capsule    Refill:  0   guaiFENesin-codeine (ROBITUSSIN AC) 100-10 MG/5ML syrup    Sig: Take 5-10 mLs by mouth at bedtime as needed for cough.    Dispense:  118 mL    Refill:  0   amoxicillin-clavulanate (AUGMENTIN) 875-125 MG tablet    Sig: Take 1 tablet by mouth 2 (two) times daily.    Dispense:  20 tablet    Refill:  0   fluconazole (DIFLUCAN) 150 MG tablet    Sig: Take one tablet by mouth on Day 1. Repeat dose 2nd tablet on Day 3.    Dispense:  2 tablet    Refill:  0     Follow up plan: Return if symptoms worsen or fail to improve.   Saralyn Pilar, DO [facility] 05/03/2023, 9:40 AM

## 2023-05-04 ENCOUNTER — Other Ambulatory Visit: Payer: Self-pay | Admitting: Family Medicine

## 2023-05-04 DIAGNOSIS — I10 Essential (primary) hypertension: Secondary | ICD-10-CM

## 2023-05-05 NOTE — Telephone Encounter (Signed)
Requested Prescriptions  Pending Prescriptions Disp Refills   amLODipine (NORVASC) 5 MG tablet [Pharmacy Med Name: AMLODIPINE BESYLATE 5MG  TABLETS] 90 tablet 1    Sig: TAKE 1 TABLET(5 MG) BY MOUTH DAILY     Cardiovascular: Calcium Channel Blockers 2 Passed - 05/04/2023  3:20 AM      Passed - Last BP in normal range    BP Readings from Last 1 Encounters:  05/03/23 136/78         Passed - Last Heart Rate in normal range    Pulse Readings from Last 1 Encounters:  05/03/23 90         Passed - Valid encounter within last 6 months    Recent Outpatient Visits           2 days ago Acute non-recurrent frontal sinusitis   [place] [facility] [place], Netta Neat, DO   1 week ago Right ear pain   [facility] Danelle Berry, PA-C   1 month ago Atopic dermatitis, unspecified type   [place] Smitty Cords, DO   4 months ago Muscle cramping   [facility] Smitty Cords, DO   7 months ago Cervical radiculopathy   [place] [facility] [place], Netta Neat, DO       Future Appointments             In 2 months Althea Charon, Netta Neat, DO [place] [facility], [facility]

## 2023-05-10 ENCOUNTER — Telehealth: Payer: Self-pay

## 2023-05-10 ENCOUNTER — Other Ambulatory Visit: Payer: Self-pay | Admitting: Family Medicine

## 2023-05-10 DIAGNOSIS — I10 Essential (primary) hypertension: Secondary | ICD-10-CM

## 2023-05-10 MED ORDER — AMLODIPINE BESYLATE 5 MG PO TABS: 5.0000 mg | ORAL_TABLET | Freq: Every day | ORAL | 1 refills | Status: DC

## 2023-05-10 NOTE — Telephone Encounter (Signed)
Requested Prescriptions  Pending Prescriptions Disp Refills   amLODipine (NORVASC) 5 MG tablet 90 tablet 1    Sig: Take 1 tablet (5 mg total) by mouth daily.     Cardiovascular: Calcium Channel Blockers 2 Passed - 05/10/2023  1:41 PM      Passed - Last BP in normal range    BP Readings from Last 1 Encounters:  05/03/23 136/78         Passed - Last Heart Rate in normal range    Pulse Readings from Last 1 Encounters:  05/03/23 90         Passed - Valid encounter within last 6 months    Recent Outpatient Visits           1 week ago Acute non-recurrent frontal sinusitis   [place] [facility] [place], Netta Neat, DO   1 week ago Right ear pain   [place] [facility] Danelle Berry, PA-C   1 month ago Atopic dermatitis, unspecified type   [facility] Smitty Cords, DO   4 months ago Muscle cramping   [place] [facility] Smitty Cords, DO   7 months ago Cervical radiculopathy   [place] [facility] [place], Netta Neat, DO       Future Appointments             In 1 month Althea Charon, Netta Neat, DO [place] [facility], [facility]

## 2023-05-10 NOTE — Telephone Encounter (Signed)
Spoke with patient, appointment scheduled for evaluation.

## 2023-05-10 NOTE — Telephone Encounter (Signed)
Pt is calling in because the pharmacy is saying that they don't have a refill request for amLODipine (NORVASC) 5 MG tablet [[id number]] even thought it's showing as being sent over.

## 2023-05-11 ENCOUNTER — Encounter: Payer: Self-pay | Admitting: Family Medicine

## 2023-05-11 ENCOUNTER — Ambulatory Visit (INDEPENDENT_AMBULATORY_CARE_PROVIDER_SITE_OTHER): Payer: 59 | Admitting: Family Medicine

## 2023-05-11 VITALS — BP 132/76 | HR 98 | Ht 67.0 in | Wt 256.0 lb

## 2023-05-11 DIAGNOSIS — Z9884 Bariatric surgery status: Secondary | ICD-10-CM | POA: Diagnosis not present

## 2023-05-11 DIAGNOSIS — Z48815 Encounter for surgical aftercare following surgery on the digestive system: Secondary | ICD-10-CM | POA: Diagnosis not present

## 2023-05-11 DIAGNOSIS — F319 Bipolar disorder, unspecified: Secondary | ICD-10-CM

## 2023-05-11 DIAGNOSIS — Z713 Dietary counseling and surveillance: Secondary | ICD-10-CM | POA: Diagnosis not present

## 2023-05-11 DIAGNOSIS — K912 Postsurgical malabsorption, not elsewhere classified: Secondary | ICD-10-CM | POA: Diagnosis not present

## 2023-05-11 DIAGNOSIS — I10 Essential (primary) hypertension: Secondary | ICD-10-CM | POA: Diagnosis not present

## 2023-05-11 DIAGNOSIS — F411 Generalized anxiety disorder: Secondary | ICD-10-CM | POA: Diagnosis not present

## 2023-05-11 DIAGNOSIS — F410 Panic disorder [episodic paroxysmal anxiety] without agoraphobia: Secondary | ICD-10-CM | POA: Diagnosis not present

## 2023-05-11 DIAGNOSIS — D509 Iron deficiency anemia, unspecified: Secondary | ICD-10-CM | POA: Diagnosis not present

## 2023-05-11 NOTE — Patient Instructions (Addendum)
Thank you for coming to the office today.  Okay to take the Alprazolam existing medication for anxiety panic and elevated BP / HR. Okay to take same day as the pain medication Oxycodone-Acetaminopphen 5/325, prefer to space them out within 8-12 hours of each other if possible when taking on the same day. Monitor how you feel if taking both. Discuss further with Psychiatry on the dosage for the Alprazolam and timing etc.  We can consider a low dose Beta blocker as needed for anxiety / elevated BP HR - Usually prefer a short acting Metoprolol, Carvedilol, or Atenolol - would be only intermittent  One alternative scenario is okay to take the Amlodipine 5mg  extra tablet in the same day for BP elevation. But ideally this not be best because it can interfere with daily BP regimen   Please schedule a Follow-up Appointment to: Return if symptoms worsen or fail to improve.  If you have any other questions or concerns, please feel free to call the office or send a message through MyChart. You may also schedule an earlier appointment if necessary.  Additionally, you may be receiving a survey about your experience at our office within a few days to 1 week by e-mail or mail. We value your feedback.  Saralyn Pilar, DO [facility], [place]

## 2023-05-11 NOTE — Progress Notes (Signed)
Subjective:    Patient ID: Suzanne Wilcox, female    DOB: Mar 28, 1976, 47 y.o.   MRN: [id number]  Suzanne Wilcox is a 47 y.o. female presenting on 05/11/2023 for Hypertension (Psychiatrist increased wellbutrin to 4 a day and added hydroxazine.  Patient has not started this yet)   HPI  Discussed the use of AI scribe software for clinical note transcription with the patient, who gave verbal consent to proceed.   The patient, with a history of hypertension, bipolar 1, seasonal affective disorder, and panic attacks, presents with concerns about elevated blood pressure and heart rate due to worsening anxiety panic recently.  She reports that these symptoms are likely tied to her panic attacks, which have been particularly severe over the past week with seasonal affective disorder impacting her now. The patient describes episodes of feeling her heartbeat in her ears, having hot and red ears, and experiencing spikes in blood pressure. She has been monitoring her blood pressure at home, with readings ranging from 130s/70s to 140s/90s, and has noticed that these elevations coincide with panic attacks.  For HYPERTENSION, she currently takes Amlodipine 5mg  daily. She is tolerating it well and it controls BP most of the time except during times of stress and panic. She has taken 2nd dose at times and it works. But not every day.  Today BP is normal range. She had several BP readings in 130s/90s then it has improved to 130s/80s and down to 130s/70 back to baseline. Seems to elevate during panic attack  Recently Psychiatry adjusted her medication with inc Wellbutrin 100mg  from 200mg  in AM and 100mg  in afternoon and now dose was increased to 200mg  twice a day. Also Psychiatry ordered Hydroxyzine for anxiety panic AS NEEDED. Has not started this yet. The patient also takes alprazolam as needed for anxiety and panic attacks, and has been using a 10,000 lux light for light therapy to manage her seasonal affective  disorder.  Regarding her panic attacks she takes Alprazolam now at lower dose 1mg  half or whole tab AS NEEDED. She has taken it a bit more often in this season with anxiety worsening. She tries to avoid taking it same time as her pain medication with oxycodone.  She has not been on beta blocker.      05/11/2023    9:23 AM 05/03/2023    9:23 AM 04/28/2023    9:19 AM  Depression screen PHQ 2/9  Decreased Interest 3 2 1   Down, Depressed, Hopeless 2 2 0  PHQ - 2 Score 5 4 1   Altered sleeping 2 3   Tired, decreased energy 3 3   Change in appetite 1 1   Feeling bad or failure about yourself  3 2   Trouble concentrating 3 3   Moving slowly or fidgety/restless 0 0   Suicidal thoughts 0 0   PHQ-9 Score 17 16   Difficult doing work/chores Extremely dIfficult Very difficult       05/11/2023    9:23 AM 05/03/2023    9:24 AM 04/02/2023    9:07 AM 01/04/2023   10:25 AM  GAD 7 : Generalized Anxiety Score  Nervous, Anxious, on Edge 3 3 3 2   Control/stop worrying 3 2 1 2   Worry too much - different things 3 2 2 2   Trouble relaxing 3 2 3 2   Restless 1 2 2 1   Easily annoyed or irritable 3 3 2 2   Afraid - awful might happen 3 2 1 1   Total  GAD 7 Score 19 16 14 12   Anxiety Difficulty    Not difficult at all      Social History   Tobacco Use   Smoking status: Former    Current packs/day: 0.00    Average packs/day: 1 pack/day for 18.0 years (18.0 ttl pk-yrs)    Types: Cigarettes    Start date: 04/30/1991    Quit date: 04/29/2009    Years since quitting: 14.0   Smokeless tobacco: Never  Vaping Use   Vaping status: Never Used  Substance Use Topics   Alcohol use: Yes    Comment: OCCASIONALLY   Drug use: No    Review of Systems Per HPI unless specifically indicated above     Objective:    BP 132/76 (BP Location: Left Arm, Patient Position: Sitting, Cuff Size: Large)   Pulse 98   Ht 5\' 7"  (1.702 m)   Wt 256 lb (116.1 kg)   BMI 40.10 kg/m   Wt Readings from Last 3 Encounters:   05/11/23 256 lb (116.1 kg)  05/03/23 253 lb (114.8 kg)  04/28/23 257 lb (116.6 kg)    Physical Exam Vitals and nursing note reviewed.  Constitutional:      General: She is not in acute distress.    Appearance: She is well-developed. She is not diaphoretic.     Comments: Well-appearing, comfortable, cooperative  HENT:     Head: Normocephalic and atraumatic.  Eyes:     General:        Right eye: No discharge.        Left eye: No discharge.     Conjunctiva/sclera: Conjunctivae normal.  Neck:     Thyroid: No thyromegaly.  Cardiovascular:     Rate and Rhythm: Regular rhythm. Tachycardia present.     Heart sounds: Normal heart sounds. No murmur heard. Pulmonary:     Effort: Pulmonary effort is normal. No respiratory distress.     Breath sounds: Normal breath sounds. No wheezing or rales.  Musculoskeletal:        General: Normal range of motion.     Cervical back: Normal range of motion and neck supple.  Lymphadenopathy:     Cervical: No cervical adenopathy.  Skin:    General: Skin is warm and dry.     Findings: No erythema or rash.  Neurological:     Mental Status: She is alert and oriented to person, place, and time.  Psychiatric:        Behavior: Behavior normal.     Comments: Well groomed, good eye contact, normal speech and thoughts      Results for orders placed or performed in visit on 01/04/23  BASIC METABOLIC PANEL WITH GFR  Result Value Ref Range   Glucose, Bld 100 65 - 139 mg/dL   BUN 17 7 - 25 mg/dL   Creat 0.86 5.78 - 4.69 mg/dL   eGFR 99 > OR = 60 GE/XBM/8.41L2   BUN/Creatinine Ratio SEE NOTE: 6 - 22 (calc)   Sodium 139 135 - 146 mmol/L   Potassium 4.2 3.5 - 5.3 mmol/L   Chloride 104 98 - 110 mmol/L   CO2 29 20 - 32 mmol/L   Calcium 9.5 8.6 - 10.2 mg/dL  Magnesium  Result Value Ref Range   Magnesium 2.3 1.5 - 2.5 mg/dL      Assessment & Plan:   Problem List Items Addressed This Visit     Bipolar 1 disorder (HCC)   Essential hypertension -  Primary   GAD (  generalized anxiety disorder)   Other Visit Diagnoses     Panic attacks            Hypertension Blood pressure spikes associated with panic attacks. Currently managed with amlodipine 5mg  daily. -Continue amlodipine 5mg  daily. -Offer beta blocker low dose immediate release AS NEEDED option Metoprolol vs Carvedilol for back up plan for anxiety and heightened BP / HR in future. She declines new med today, reconsider if has recurrent problem. -Last option would consider taking an additional amlodipine 5mg  tablet during episodes of elevated blood pressure, but I would prefer not to use Amlodipine as a AS NEEDED, due to duration of med and pill counts.  GAD Anxiety / Panic Attacks Associated with seasonal affective disorder and causing elevated blood pressure and heart rate. Currently managed with alprazolam 1mg  (half tablet twice daily) and Wellbutrin 100mg  (two in the morning, two in the afternoon). Psychiatrist has recommended increasing Wellbutrin dosage and adding hydroxyzine. -Continue alprazolam and Wellbutrin as prescribed by psychiatrist. -Use hydroxyzine as needed for anxiety. -Consider taking additional alprazolam during heightened anxiety episodes. She will discuss with Psych. We reviewed dosing regimen and okay to take on same day 8-12 hours later after Oxycodone PRN -Consider low-dose beta blocker as needed for anxiety and elevated blood pressure/heart rate.  Seasonal Affective Disorder Currently managed with Wellbutrin and light therapy. -Continue Wellbutrin and light therapy as prescribed by psychiatrist.  Pain Management Patient is on oxycodone-acetaminophen 5-325 for back pain. -Continue oxycodone-acetaminophen as needed for pain. -Take alprazolam and oxycodone-acetaminophen at least 8-12 hours apart if possible.   No orders of the defined types were placed in this encounter.    Follow up plan: Return if symptoms worsen or fail to  improve.   Saralyn Pilar, DO [facility] 05/11/2023, 9:31 AM

## 2023-05-29 ENCOUNTER — Other Ambulatory Visit: Payer: Self-pay | Admitting: Family Medicine

## 2023-05-29 DIAGNOSIS — K219 Gastro-esophageal reflux disease without esophagitis: Secondary | ICD-10-CM

## 2023-05-31 ENCOUNTER — Other Ambulatory Visit: Payer: Self-pay

## 2023-05-31 MED ORDER — OMEPRAZOLE 40 MG PO CPDR: 40.0000 mg | DELAYED_RELEASE_CAPSULE | Freq: Every day | ORAL | 3 refills | Status: DC

## 2023-06-01 NOTE — Telephone Encounter (Signed)
The original prescription was discontinued on 07/06/2022 by Burnell Blanks, CMA. Renewing this prescription may not be appropriate.   Requested Prescriptions  Pending Prescriptions Disp Refills   omeprazole (PRILOSEC) 40 MG capsule [Pharmacy Med Name: OMEPRAZOLE 40MG  CAPSULES] 90 capsule 1    Sig: TAKE 1 CAPSULE(40 MG) BY MOUTH AT BEDTIME     Gastroenterology: Proton Pump Inhibitors Passed - 05/29/2023  3:18 AM      Passed - Valid encounter within last 12 months    Recent Outpatient Visits           3 weeks ago Essential hypertension   [facility], Netta Neat, DO   4 weeks ago Acute non-recurrent frontal sinusitis   [facility] [place], Netta Neat, DO   1 month ago Right ear pain   [facility] Danelle Berry, PA-C   2 months ago Atopic dermatitis, unspecified type   [facility] Smitty Cords, DO   4 months ago Muscle cramping   [facility] Smitty Cords, DO       Future Appointments             In 1 month Althea Charon, Netta Neat, DO [facility]

## 2023-06-03 ENCOUNTER — Encounter: Payer: Self-pay | Admitting: Family Medicine

## 2023-06-04 ENCOUNTER — Encounter: Payer: Self-pay | Admitting: Internal Medicine

## 2023-06-04 ENCOUNTER — Telehealth: Payer: Self-pay | Admitting: Family Medicine

## 2023-06-04 ENCOUNTER — Ambulatory Visit (INDEPENDENT_AMBULATORY_CARE_PROVIDER_SITE_OTHER): Payer: 59 | Admitting: Internal Medicine

## 2023-06-04 VITALS — BP 155/82 | Ht 67.0 in | Wt 247.0 lb

## 2023-06-04 DIAGNOSIS — J3089 Other allergic rhinitis: Secondary | ICD-10-CM

## 2023-06-04 DIAGNOSIS — J069 Acute upper respiratory infection, unspecified: Secondary | ICD-10-CM | POA: Diagnosis not present

## 2023-06-04 DIAGNOSIS — R059 Cough, unspecified: Secondary | ICD-10-CM

## 2023-06-04 MED ORDER — PREDNISONE 10 MG PO TABS: ORAL_TABLET | ORAL | 0 refills | Status: DC

## 2023-06-04 MED ORDER — HYDROCOD POLI-CHLORPHE POLI ER 10-8 MG/5ML PO SUER: 5.0000 mL | Freq: Two times a day (BID) | ORAL | 0 refills | Status: AC | PRN

## 2023-06-04 NOTE — Patient Instructions (Signed)

## 2023-06-04 NOTE — Telephone Encounter (Signed)
Pt had appt yesterday and was sent in cough syrup, she states Dr Sampson Si was supposed to send in steroids also, but it not at the pharmacy.

## 2023-06-04 NOTE — Progress Notes (Signed)
Subjective:    Patient ID: Suzanne Wilcox, female    DOB: 08-01-1975, 47 y.o.   MRN: [id number]  HPI  Discussed the use of AI scribe software for clinical note transcription with the patient, who gave verbal consent to proceed.  The patient, with a history of gastric bypass surgery, presents with a prolonged illness that began before Halloween. She describes a constellation of symptoms including sinus pain, nasal congestion, cough, sore throat, nausea, and occasional body aches. The patient also reports loose stools and an unusual odor. She took a home covid test which was negative.  The patient was seen on November 4th for a ruptured eardrum and was prescribed augmentin, tessalon, robitussin ac, and atrovent nasal spray. Despite this treatment, the patient reports that her symptoms have worsened again, particularly in the last two days. She has been managing her symptoms with over-the-counter medications like dayquil.  She denies any changes in her living situation, diet, or medication regimen that could account for her prolonged illness.    Review of Systems   Past Medical History:  Diagnosis Date   Allergy    Anxiety    Bipolar disorder (HCC)    Bipolar disorder (HCC)    Depression    Generalized headaches    GERD (gastroesophageal reflux disease)    Hx of varicella    Hyperlipidemia    Hypertension    Missed abortions    X 6   Obesity 04/29/2012   Postoperative retention of urine 03/28/2016   Postpartum care following cesarean delivery (9/29) 03/27/2016   Sleep apnea    Termination of pregnancy (fetus) 03/22/2004   X 1   Vaginal Pap smear, abnormal     Current Outpatient Medications  Medication Sig Dispense Refill   ACETAMINOPHEN-CAFFEINE PO Take 1 capsule by mouth in the morning and at bedtime.     ALPRAZolam (XANAX) 1 MG tablet Take 1 mg by mouth 2 (two) times daily as needed for anxiety.     amLODipine (NORVASC) 5 MG tablet Take 1 tablet (5 mg total) by mouth  daily. 90 tablet 1   amphetamine-dextroamphetamine (ADDERALL) 30 MG tablet Take 30 mg by mouth daily.     Biotin 1 MG CAPS Take by mouth.     buPROPion (WELLBUTRIN) 100 MG tablet Take 100 mg by mouth 2 (two) times daily. 2 in the morning and 2 in the afternoon     calcium citrate (CALCITRATE - DOSED IN MG ELEMENTAL CALCIUM) 950 (200 Ca) MG tablet Take 200 mg of elemental calcium by mouth daily.     CAPLYTA 42 MG capsule Take 42 mg by mouth at bedtime.     co-enzyme Q-10 30 MG capsule Take 30 mg by mouth 3 (three) times daily.     ipratropium (ATROVENT) 0.06 % nasal spray Place 2 sprays into both nostrils 4 (four) times daily. For up to 5-7 days then stop. 15 mL 0   Iron-Vitamin C (VITRON-C) 65-125 MG TABS Take by mouth.     Magnesium-Potassium 40-40 MG CAPS Take by mouth.     methocarbamol (ROBAXIN) 750 MG tablet Take 1 tablet (750 mg total) by mouth every 8 (eight) hours as needed for muscle spasms. 90 tablet 1   Multiple Vitamin (MULTI-VITAMIN DAILY PO) Take by mouth.     nystatin cream (MYCOSTATIN) Apply 1 Application topically 2 (two) times daily.     Omega-3 Fatty Acids (FISH OIL) 300 MG CAPS Take by mouth.     omeprazole (PRILOSEC) 40 MG  capsule Take 1 capsule (40 mg total) by mouth daily. 90 capsule 3   ondansetron (ZOFRAN) 8 MG tablet Take 8 mg by mouth every 8 (eight) hours as needed for nausea or vomiting. Pt has not started medication yet     oxyCODONE-acetaminophen (PERCOCET/ROXICET) 5-325 MG tablet Take 1 tablet by mouth every 4 (four) hours as needed for moderate pain. 60 tablet 0   pantoprazole (PROTONIX) 40 MG tablet Take 1 tablet (40 mg total) by mouth 2 (two) times daily before a meal. 60 tablet 2   potassium chloride SA (KLOR-CON M) 20 MEQ tablet Take 1 tablet (20 mEq total) by mouth daily. 90 tablet 1   triamcinolone cream (KENALOG) 0.5 % Apply 1 Application topically 2 (two) times daily. To affected areas, for up to 2 weeks. 30 g 0   No current facility-administered  medications for this visit.    Allergies  Allergen Reactions   Wound Dressing Adhesive Itching, Other (See Comments) and Swelling   Wound Dressings Itching, Other (See Comments) and Swelling   Adhesive [Tape] Rash   Lyrica [Pregabalin] Photosensitivity    Family History  Problem Relation Age of Onset   Depression Mother    Skin cancer Mother    Pancreatic cancer Father    Kidney Stones Father    Stroke Father    Dementia Father    Skin cancer Sister    Depression Paternal Grandmother    Breast cancer Neg Hx     Social History   Socioeconomic History   Marital status: Single    Spouse name: Not on file   Number of children: Not on file   Years of education: Not on file   Highest education level: Some college, no degree  Occupational History   Not on file  Tobacco Use   Smoking status: Former    Current packs/day: 0.00    Average packs/day: 1 pack/day for 18.0 years (18.0 ttl pk-yrs)    Types: Cigarettes    Start date: 04/30/1991    Quit date: 04/29/2009    Years since quitting: 14.1   Smokeless tobacco: Never  Vaping Use   Vaping status: Never Used  Substance and Sexual Activity   Alcohol use: Yes    Comment: OCCASIONALLY   Drug use: No   Sexual activity: Not on file  Other Topics Concern   Not on file  Social History Narrative   Right handed   One story home   4 children   Drinks caffeine 1-2 cups a day   Social Determinants of Health   Financial Resource Strain: Low Risk  (04/28/2023)   Overall Financial Resource Strain (CARDIA)    Difficulty of Paying Living Expenses: Not very hard  Food Insecurity: Food Insecurity Present (04/28/2023)   Hunger Vital Sign    Worried About Running Out of Food in the Last Year: Sometimes true    Ran Out of Food in the Last Year: Never true  Transportation Needs: No Transportation Needs (04/28/2023)   PRAPARE - Administrator, Civil Service (Medical): No    Lack of Transportation (Non-Medical): No   Physical Activity: Insufficiently Active (04/28/2023)   Exercise Vital Sign    Days of Exercise per Week: 2 days    Minutes of Exercise per Session: 20 min  Stress: Stress Concern Present (04/28/2023)   [employer] of Occupational Health - Occupational Stress Questionnaire    Feeling of Stress : To some extent  Social Connections: Moderately Integrated (04/28/2023)  Social Advertising account executive [NHANES]    Frequency of Communication with Friends and Family: More than three times a week    Frequency of Social Gatherings with Friends and Family: Once a week    Attends Religious Services: 1 to 4 times per year    Active Member of [employer] or Organizations: Yes    Attends Banker Meetings: 1 to 4 times per year    Marital Status: Divorced  Catering manager Violence: Not At Risk (07/31/2022)   Humiliation, Afraid, Rape, and Kick questionnaire    Fear of Current or Ex-Partner: No    Emotionally Abused: No    Physically Abused: No    Sexually Abused: No     Constitutional: Denies fever, malaise, fatigue, headache or abrupt weight changes.  HEENT: Pt reports sinus pressure, nasal congestion, left ear pain and sore throat. Denies eye pain, eye redness,  ringing in the ears, wax buildup, runny nose, bloody nose. Respiratory: Pt reports cough. Denies difficulty breathing, shortness of breath.   Cardiovascular: Denies chest pain, chest tightness, palpitations or swelling in the hands or feet.  Gastrointestinal: Pt reports nausea, loose stools. Denies abdominal pain, bloating, constipation, or blood in the stool.  Musculoskeletal: Pt reports body aches. Denies decrease in range of motion, difficulty with gait, or joint pain and swelling.  Skin: Denies redness, rashes, lesions or ulcercations.  Neurological: Denies dizziness, difficulty with memory, difficulty with speech or problems with balance and coordination.    No other specific complaints in a complete review of  systems (except as listed in HPI above).      Objective:   Physical Exam BP (!) 155/82   Ht 5\' 7"  (1.702 m)   Wt 247 lb (112 kg)   BMI 38.69 kg/m   Wt Readings from Last 3 Encounters:  05/11/23 256 lb (116.1 kg)  05/03/23 253 lb (114.8 kg)  04/28/23 257 lb (116.6 kg)    General: Appears her stated age, appears unwell but in NAD. Skin: Warm, dry and intact.  HEENT: Head: normal shape and size, maxillary sinus pressure noted; Eyes: sclera white, no icterus, conjunctiva pink, PERRLA and EOMs intact; Ears: Tm's gray and intact, normal light reflex; Nose: mucosa pink and moist, septum midline; Throat/Mouth: Teeth present, mucosa pink and moist, + PND, no exudate, lesions or ulcerations noted.  Neck:  No adenopathy noted. Cardiovascular: Normal rate and rhythm. S1,S2 noted.  No murmur, rubs or gallops noted. No JVD or BLE edema. No carotid bruits noted. Pulmonary/Chest: Normal effort and positive vesicular breath sounds, with intermittent bilateral expiratory wheezing noted. No respiratory distress. No  rales or ronchi noted.  Neurological: Alert and oriented.   BMET    Component Value Date/Time   NA 139 01/04/2023 1055   K 4.2 01/04/2023 1055   CL 104 01/04/2023 1055   CO2 29 01/04/2023 1055   GLUCOSE 100 01/04/2023 1055   BUN 17 01/04/2023 1055   CREATININE 0.75 01/04/2023 1055   CALCIUM 9.5 01/04/2023 1055   GFRNONAA >60 04/28/2017 0820   GFRAA >60 04/28/2017 0820    Lipid Panel     Component Value Date/Time   CHOL 169 07/06/2022 1043   TRIG 89 07/06/2022 1043   HDL 54 07/06/2022 1043   CHOLHDL 3.1 07/06/2022 1043   LDLCALC 97 07/06/2022 1043    CBC    Component Value Date/Time   WBC 6.3 04/28/2017 0820   RBC 4.70 04/28/2017 0820   HGB 13.4 04/28/2017 0820   HCT 40.5  04/28/2017 0820   PLT 310 04/28/2017 0820   MCV 86.2 04/28/2017 0820   MCH 28.5 04/28/2017 0820   MCHC 33.1 04/28/2017 0820   RDW 13.5 04/28/2017 0820   LYMPHSABS 1.8 04/28/2017 0820   MONOABS  0.3 04/28/2017 0820   EOSABS 0.1 04/28/2017 0820   BASOSABS 0.0 04/28/2017 0820    Hgb A1C Lab Results  Component Value Date   HGBA1C 5.5 07/06/2022            Assessment & Plan:  Assessment and Plan    Viral Upper Respiratory Infection with Cough Persistent symptoms including sinus pain, nasal congestion, runny nose, cough, and sore throat. No improvement with Augmentin, Teflon, Robitussin AC, and Atrovent nasal spray. Possible bronchitis noted on exam. -Start Prednisone taper x 6 days. -Refill cough syrup (Tussionex). -Start daily antihistamine such as Claritin, Allegra and Zyrtec (patient has at home). -Continue Atrovent nasal spray for sinus pressure.    Follow-up with your PCP as previously scheduled Nicki Reaper, NP

## 2023-06-09 LAB — GENECONNECT MOLECULAR SCREEN: Genetic Analysis Overall Interpretation: POSITIVE — AB

## 2023-06-10 ENCOUNTER — Telehealth: Payer: Self-pay | Admitting: Medical Genetics

## 2023-06-10 DIAGNOSIS — Z1509 Genetic susceptibility to other malignant neoplasm: Secondary | ICD-10-CM

## 2023-06-10 NOTE — Telephone Encounter (Signed)
[place] [facility] 06/10/2023 3:53 PM  FIRST ATTEMPT: Confirmed I was speaking with Suzanne Wilcox [id number] by using name and DOB. Informed participant the reason for this call is to provide results for the above study. Results revealed Hereditary Breast and Ovarian Syndrome. Genetic counseling was offered and participant requests a call back to schedule. Will also ask the PCP to refer to oncology for further evaluation and management. All questions were answered, and participant was thanked for their time and support of the above study. Participant was encouraged to contact [facility] if they have any further questions or concerns.

## 2023-06-11 ENCOUNTER — Encounter: Payer: Self-pay | Admitting: Medical Genetics

## 2023-06-11 ENCOUNTER — Telehealth: Payer: Self-pay | Admitting: Medical Genetics

## 2023-06-11 DIAGNOSIS — Z1501 Genetic susceptibility to malignant neoplasm of breast: Secondary | ICD-10-CM | POA: Insufficient documentation

## 2023-06-11 DIAGNOSIS — Z15068 Genetic susceptibility to other malignant neoplasm of digestive system: Secondary | ICD-10-CM | POA: Insufficient documentation

## 2023-06-11 NOTE — Telephone Encounter (Signed)
[place] [facility] 12/13/20214 @ 8:52 AM  Genetic counseling was offered and participant is scheduled. All questions were answered, and participant was thanked for their time and support of the above study. Participant was encouraged to contact [facility] if they have any further questions or concerns.     Newman Nip, BS [facility]  [facility] Clinical Research Specialist II Direct Dial: [phone]  Fax: [phone] Website: [facility]  [facility] Research Program  [facility]

## 2023-06-11 NOTE — Telephone Encounter (Signed)
Referral placed to [facility] for Oncology, further eval and management of BRCA2 positive on [place] [facility] genetic testing.  Saralyn Pilar, DO [facility] 06/11/2023, 12:43 PM   -------------------  Copied routed chart message from Dr Blake Divine  Dear Dr. Althea Charon, [name] participated in our genomic population screening program and was found to have a change in the BRCA2 gene. 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 I mentioned to Y[place]ai that I would ask you to refer her to Oncology at [facility] (e.g., Dr. Christian Mate) for further evaluation and management. She was interested in that referral.   In addition, our research team will set up a genetic counseling visit for her.   Please let me know if you have any questions.   [place] Haldeman-Englert, MD   -------------------------------------

## 2023-06-11 NOTE — Addendum Note (Signed)
Addended by: Smitty Cords on: 06/11/2023 12:43 PM   Modules accepted: Orders

## 2023-06-17 ENCOUNTER — Inpatient Hospital Stay: Payer: 59 | Attending: Oncology | Admitting: Oncology

## 2023-06-17 ENCOUNTER — Encounter: Payer: Self-pay | Admitting: Oncology

## 2023-06-17 ENCOUNTER — Inpatient Hospital Stay: Payer: 59

## 2023-06-17 VITALS — BP 146/84 | HR 84 | Temp 97.4°F | Resp 18 | Ht 67.0 in | Wt 246.0 lb

## 2023-06-17 DIAGNOSIS — Z1501 Genetic susceptibility to malignant neoplasm of breast: Secondary | ICD-10-CM | POA: Insufficient documentation

## 2023-06-17 DIAGNOSIS — Z7189 Other specified counseling: Secondary | ICD-10-CM | POA: Diagnosis not present

## 2023-06-17 DIAGNOSIS — Z1509 Genetic susceptibility to other malignant neoplasm: Secondary | ICD-10-CM | POA: Diagnosis not present

## 2023-06-17 NOTE — Progress Notes (Signed)
[facility]  [phone]) [phone] Fax:(336) [phone]  ID: Suzanne Wilcox OB: 10-Jul-1975  MR#: [id number]  ION#:[id number]  Patient Care Team: Smitty Cords, DO as PCP - General (Family Medicine) Drema Dallas, DO as Consulting Physician (Neurology)  CHIEF COMPLAINT: BRCA2 positive  INTERVAL HISTORY: Patient is a 47 year old female who was recently found to carry the BRCA2 gene through the [employer] study.  She has no personal or family history of cancer.  She also recently found out that her biological father was a sperm donor and died in the 93s.  She was able to track down his sister who reported no malignancy in their family.  She is anxious, but otherwise feels well.  She has no neurologic complaints.  She denies any recent fevers or illnesses.  She has a good appetite and denies weight loss.  She has no chest pain, shortness of breath, cough, or hemoptysis.  She denies any nausea, vomiting, constipation, or diarrhea.  She has no urinary complaints.  Patient otherwise feels well and offers no further specific complaints today.  REVIEW OF SYSTEMS:   Review of Systems  Constitutional: Negative.  Negative for fever, malaise/fatigue and weight loss.  Respiratory: Negative.  Negative for cough, hemoptysis and shortness of breath.   Cardiovascular: Negative.  Negative for chest pain and leg swelling.  Gastrointestinal: Negative.  Negative for abdominal pain.  Genitourinary: Negative.  Negative for dysuria.  Musculoskeletal: Negative.  Negative for back pain.  Skin: Negative.  Negative for rash.  Neurological: Negative.  Negative for focal weakness, weakness and headaches.  Psychiatric/Behavioral:  The patient is nervous/anxious.     As per HPI. Otherwise, a complete review of systems is negative.  PAST MEDICAL HISTORY: Past Medical History:  Diagnosis Date   Allergy    Anxiety    Bipolar disorder (HCC)    Bipolar disorder (HCC)    Depression     Generalized headaches    GERD (gastroesophageal reflux disease)    Hx of varicella    Hyperlipidemia    Hypertension    Missed abortions    X 6   Obesity 04/29/2012   Postoperative retention of urine 03/28/2016   Postpartum care following cesarean delivery (9/29) 03/27/2016   Sleep apnea    Termination of pregnancy (fetus) 03/22/2004   X 1   Vaginal Pap smear, abnormal     PAST SURGICAL HISTORY: Past Surgical History:  Procedure Laterality Date   CESAREAN SECTION  06/29/2009   X 1 [facility] [place]   CESAREAN SECTION N/A 05/22/2014   Procedure: Repeat CESAREAN SECTION;  Surgeon: Lenoard Aden, MD;  Location: [facility];  Service: Obstetrics;  Laterality: N/A;  EDD: 05/31/14   CESAREAN SECTION WITH BILATERAL TUBAL LIGATION Bilateral 03/27/2016   Procedure: Repeat CESAREAN SECTION WITH BILATERAL TUBAL LIGATION;  Surgeon: Olivia Mackie, MD;  Location: [facility];  Service: Obstetrics;  Laterality: Bilateral;  EDD: 04/01/16   CHOLECYSTECTOMY N/A 04/29/2017   Procedure: LAPAROSCOPIC CHOLECYSTECTOMY WITH INTRAOPERATIVE CHOLANGIOGRAM;  Surgeon: Ovidio Kin, MD;  Location: [facility];  Service: General;  Laterality: N/A;  ERAS PATHWAY   COLONOSCOPY WITH PROPOFOL N/A 09/04/2022   Procedure: COLONOSCOPY WITH PROPOFOL;  Surgeon: Toney Reil, MD;  Location: [facility];  Service: Gastroenterology;  Laterality: N/A;   DILATION AND CURETTAGE OF UTERUS   2005, 2010     x2 MAB   ESOPHAGOGASTRODUODENOSCOPY N/A 01/11/2017   Procedure: ESOPHAGOGASTRODUODENOSCOPY (EGD);  Surgeon: Kerin Salen, MD;  Location: [facility];  Service:  Gastroenterology;  Laterality: N/A;   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   IR GENERIC HISTORICAL  03/26/2016   IR FLUORO GUIDE CV LINE RIGHT 03/26/2016 [facility]   IR GENERIC HISTORICAL  03/26/2016   IR US GUIDE VASC ACCESS RIGHT 03/26/2016 [facility]   WISDOM TOOTH EXTRACTION      FAMILY HISTORY: Family History  Problem Relation Age of Onset   Depression  Mother    Skin cancer Mother    Pancreatic cancer Father    Kidney Stones Father    Stroke Father    Dementia Father    Skin cancer Sister    Depression Paternal Grandmother    Breast cancer Neg Hx     ADVANCED DIRECTIVES (Y/N):  N  HEALTH MAINTENANCE: Social History   Tobacco Use   Smoking status: Former    Current packs/day: 0.00    Average packs/day: 1 pack/day for 18.0 years (18.0 ttl pk-yrs)    Types: Cigarettes    Start date: 04/30/1991    Quit date: 04/29/2009    Years since quitting: 14.1   Smokeless tobacco: Never  Vaping Use   Vaping status: Never Used  Substance Use Topics   Alcohol use: Yes    Comment: OCCASIONALLY   Drug use: No     Colonoscopy:  PAP:  Bone density:  Lipid panel:  Allergies  Allergen Reactions   Wound Dressing Adhesive Itching, Other (See Comments) and Swelling   Wound Dressings Itching, Other (See Comments) and Swelling   Adhesive [Tape] Rash   Lyrica [Pregabalin] Photosensitivity    Current Outpatient Medications  Medication Sig Dispense Refill   ACETAMINOPHEN-CAFFEINE PO Take 1 capsule by mouth in the morning and at bedtime.     ALPRAZolam (XANAX) 1 MG tablet Take 1 mg by mouth 2 (two) times daily as needed for anxiety.     amLODipine (NORVASC) 5 MG tablet Take 1 tablet (5 mg total) by mouth daily. 90 tablet 1   amphetamine-dextroamphetamine (ADDERALL) 30 MG tablet Take 30 mg by mouth daily.     Biotin 1 MG CAPS Take by mouth.     buPROPion (WELLBUTRIN) 100 MG tablet Take 100 mg by mouth 2 (two) times daily. 2 in the morning and 2 in the afternoon     calcium citrate (CALCITRATE - DOSED IN MG ELEMENTAL CALCIUM) 950 (200 Ca) MG tablet Take 200 mg of elemental calcium by mouth daily.     CAPLYTA 42 MG capsule Take 42 mg by mouth at bedtime.     co-enzyme Q-10 30 MG capsule Take 30 mg by mouth 3 (three) times daily.     ipratropium (ATROVENT) 0.06 % nasal spray Place 2 sprays into both nostrils 4 (four) times daily. For up to 5-7  days then stop. 15 mL 0   Iron-Vitamin C (VITRON-C) 65-125 MG TABS Take by mouth.     Magnesium-Potassium 40-40 MG CAPS Take by mouth.     methocarbamol (ROBAXIN) 750 MG tablet Take 1 tablet (750 mg total) by mouth every 8 (eight) hours as needed for muscle spasms. 90 tablet 1   Multiple Vitamin (MULTI-VITAMIN DAILY PO) Take by mouth.     nystatin cream (MYCOSTATIN) Apply 1 Application topically 2 (two) times daily.     Omega-3 Fatty Acids (FISH OIL) 300 MG CAPS Take by mouth.     ondansetron (ZOFRAN) 8 MG tablet Take 8 mg by mouth every 8 (eight) hours as needed for nausea or vomiting. Pt has not started medication yet  oxyCODONE-acetaminophen (PERCOCET/ROXICET) 5-325 MG tablet Take 1 tablet by mouth every 4 (four) hours as needed for moderate pain. 60 tablet 0   pantoprazole (PROTONIX) 40 MG tablet Take 1 tablet (40 mg total) by mouth 2 (two) times daily before a meal. 60 tablet 2   potassium chloride SA (KLOR-CON M) 20 MEQ tablet Take 1 tablet (20 mEq total) by mouth daily. 90 tablet 1   triamcinolone cream (KENALOG) 0.5 % Apply 1 Application topically 2 (two) times daily. To affected areas, for up to 2 weeks. 30 g 0   No current facility-administered medications for this visit.    OBJECTIVE: Vitals:   06/17/23 1107  BP: (!) 146/84  Pulse: 84  Resp: 18  Temp: (!) 97.4 F (36.3 C)  SpO2: 100%     Body mass index is 38.53 kg/m.    ECOG FS:0 - Asymptomatic  General: Well-developed, well-nourished, no acute distress. Eyes: Pink conjunctiva, anicteric sclera. HEENT: Normocephalic, moist mucous membranes. Lungs: No audible wheezing or coughing. Heart: Regular rate and rhythm. Abdomen: Soft, nontender, no obvious distention. Musculoskeletal: No edema, cyanosis, or clubbing. Neuro: Alert, answering all questions appropriately. Cranial nerves grossly intact. Skin: No rashes or petechiae noted. Psych: Normal affect. Lymphatics: No cervical, calvicular, axillary or inguinal  LAD.   LAB RESULTS:  Lab Results  Component Value Date   NA 139 01/04/2023   K 4.2 01/04/2023   CL 104 01/04/2023   CO2 29 01/04/2023   GLUCOSE 100 01/04/2023   BUN 17 01/04/2023   CREATININE 0.75 01/04/2023   CALCIUM 9.5 01/04/2023   PROT 6.3 07/06/2022   ALBUMIN 3.9 04/28/2017   AST 18 07/06/2022   ALT 18 07/06/2022   ALKPHOS 72 04/28/2017   BILITOT 0.4 07/06/2022   GFRNONAA >60 04/28/2017   GFRAA >60 04/28/2017    Lab Results  Component Value Date   WBC 6.3 04/28/2017   NEUTROABS 4.1 04/28/2017   HGB 13.4 04/28/2017   HCT 40.5 04/28/2017   MCV 86.2 04/28/2017   PLT 310 04/28/2017     STUDIES: No results found.  ASSESSMENT: BRCA2 positive  PLAN:    BRCA2 positive: Patient has no personal or reported history of malignancy.  She has 3 daughters all under the age of 62.  We discussed at length her lifetime risk of developing both breast and ovarian cancer as well as other types of malignancies such as pancreatic cancer.  We also discussed risk reduction strategies which include bilateral mastectomy plus or minus reconstruction, tamoxifen, or yearly screening with MRI alternating with mammogram every 6 months.  We also discussed the possibility of a total hysterectomy.  Patient states that she wishes to proceed with a total hysterectomy and a referral has been made to gynecology.  She states she will likely proceed with bilateral mastectomy and reconstruction, but wanted to have a conversation with plastic surgery prior to making a final decision.  A referral has been sent to plastic surgery as well.  No follow-up has been scheduled at this time, but have instructed to call clinic and scheduled follow-up if she has any further questions.  I spent a total of 60 minutes reviewing chart data, face-to-face evaluation with the patient, counseling and coordination of care as detailed above.   Patient expressed understanding and was in agreement with this plan. She also  understands that She can call clinic at any time with any questions, concerns, or complaints.    Cancer Staging  No matching staging information was found for the  patient.   Jeralyn Ruths, MD   06/17/2023 11:33 AM

## 2023-06-21 ENCOUNTER — Telehealth: Payer: Self-pay

## 2023-06-21 DIAGNOSIS — Z1509 Genetic susceptibility to other malignant neoplasm: Secondary | ICD-10-CM

## 2023-06-21 NOTE — Telephone Encounter (Signed)
Copied from CRM [id number]. Topic: Referral - Request for Referral >> Jun 18, 2023 11:46 AM Franchot Heidelberg wrote: Pt called requesting referrals for a mammogram and a breast MRI, please advise

## 2023-06-21 NOTE — Addendum Note (Signed)
Addended by: Smitty Cords on: 06/21/2023 12:17 PM   Modules accepted: Orders

## 2023-06-21 NOTE — Telephone Encounter (Signed)
[facility] - Please notify patient that we have placed orders and will work with our referral coordinator to assist with scheduling.  The patient may try to call / schedule [facility] to schedule her Mammo.  [facility] [address], [address] [place], [place] [address] Phone: [phone]  The MRI will be scheduled by our team.  ------------  Yes, I reviewed Dr Milinda Cave notes. She is BRCA2 Gene Positive.  She was asked to get Mammogram + MRI it seems at this time.  I have placed both orders.  Also I will route to [facility] for authorizing these orders and assist with scheduling.  Thank you!  Saralyn Pilar, DO [facility] 06/21/2023, 12:14 PM

## 2023-06-22 ENCOUNTER — Other Ambulatory Visit: Payer: Self-pay | Admitting: Family Medicine

## 2023-06-22 DIAGNOSIS — M5020 Other cervical disc displacement, unspecified cervical region: Secondary | ICD-10-CM

## 2023-06-24 MED ORDER — OXYCODONE-ACETAMINOPHEN 5-325 MG PO TABS: 1.0000 | ORAL_TABLET | Freq: Two times a day (BID) | ORAL | 0 refills | Status: DC

## 2023-06-28 NOTE — Progress Notes (Signed)
PA started  Suzanne Wilcox (Key: [id number]) Need Help? Call us at [phone] Status New (Not sent to plan) Drug oxyCODONE-Acetaminophen 5-325MG  tablets ePA cloud logo Form [employer] Medicare Part D Electronic Prior Authorization Form [phone] NCPDP)

## 2023-06-29 ENCOUNTER — Institutional Professional Consult (permissible substitution): Payer: 59 | Admitting: Plastic Surgery

## 2023-06-29 ENCOUNTER — Ambulatory Visit
Admission: RE | Admit: 2023-06-29 | Discharge: 2023-06-29 | Disposition: A | Discharge status: Home / Self Care | Payer: 59 | Source: Ambulatory Visit | Attending: Family Medicine | Admitting: Family Medicine

## 2023-06-29 DIAGNOSIS — R92323 Mammographic fibroglandular density, bilateral breasts: Secondary | ICD-10-CM | POA: Diagnosis not present

## 2023-06-29 DIAGNOSIS — Z1239 Encounter for other screening for malignant neoplasm of breast: Secondary | ICD-10-CM | POA: Diagnosis not present

## 2023-06-29 DIAGNOSIS — Z1509 Genetic susceptibility to other malignant neoplasm: Secondary | ICD-10-CM | POA: Diagnosis present

## 2023-06-29 DIAGNOSIS — Z1501 Genetic susceptibility to malignant neoplasm of breast: Secondary | ICD-10-CM | POA: Insufficient documentation

## 2023-06-29 MED ORDER — GADOBUTROL 1 MMOL/ML IV SOLN
10.0000 mL | Freq: Once | INTRAVENOUS | Status: AC | PRN
  Administered 2023-06-29: 10 mL via INTRAVENOUS

## 2023-06-30 ENCOUNTER — Other Ambulatory Visit: Payer: Self-pay | Admitting: Family Medicine

## 2023-06-30 DIAGNOSIS — E876 Hypokalemia: Secondary | ICD-10-CM

## 2023-07-01 ENCOUNTER — Other Ambulatory Visit: Payer: Self-pay | Admitting: Family Medicine

## 2023-07-01 DIAGNOSIS — Z0000 Encounter for general adult medical examination without abnormal findings: Secondary | ICD-10-CM

## 2023-07-01 DIAGNOSIS — F411 Generalized anxiety disorder: Secondary | ICD-10-CM

## 2023-07-01 DIAGNOSIS — F319 Bipolar disorder, unspecified: Secondary | ICD-10-CM

## 2023-07-01 DIAGNOSIS — R7309 Other abnormal glucose: Secondary | ICD-10-CM

## 2023-07-01 DIAGNOSIS — I10 Essential (primary) hypertension: Secondary | ICD-10-CM

## 2023-07-01 DIAGNOSIS — K912 Postsurgical malabsorption, not elsewhere classified: Secondary | ICD-10-CM

## 2023-07-02 ENCOUNTER — Other Ambulatory Visit: Payer: 59

## 2023-07-02 DIAGNOSIS — K912 Postsurgical malabsorption, not elsewhere classified: Secondary | ICD-10-CM | POA: Diagnosis not present

## 2023-07-02 DIAGNOSIS — Z0000 Encounter for general adult medical examination without abnormal findings: Secondary | ICD-10-CM

## 2023-07-02 DIAGNOSIS — R7309 Other abnormal glucose: Secondary | ICD-10-CM | POA: Diagnosis not present

## 2023-07-02 DIAGNOSIS — I10 Essential (primary) hypertension: Secondary | ICD-10-CM | POA: Diagnosis not present

## 2023-07-02 DIAGNOSIS — F319 Bipolar disorder, unspecified: Secondary | ICD-10-CM

## 2023-07-03 LAB — CBC WITH DIFFERENTIAL/PLATELET
Absolute Lymphocytes: 1615 {cells}/uL (ref 850–3900)
Absolute Monocytes: 381 {cells}/uL (ref 200–950)
Basophils Absolute: 0 {cells}/uL (ref 0–200)
Basophils Relative: 0 %
Eosinophils Absolute: 0 {cells}/uL — ABNORMAL LOW (ref 15–500)
Eosinophils Relative: 0 %
HCT: 42.3 % (ref 35.0–45.0)
Hemoglobin: 13.6 g/dL (ref 11.7–15.5)
MCH: 29.9 pg (ref 27.0–33.0)
MCHC: 32.2 g/dL (ref 32.0–36.0)
MCV: 93 fL (ref 80.0–100.0)
MPV: 11 fL (ref 7.5–12.5)
Monocytes Relative: 9.3 %
Neutro Abs: 2103 {cells}/uL (ref 1500–7800)
Neutrophils Relative %: 51.3 %
Platelets: 260 10*3/uL (ref 140–400)
RBC: 4.55 10*6/uL (ref 3.80–5.10)
RDW: 13.3 % (ref 11.0–15.0)
Total Lymphocyte: 39.4 %
WBC: 4.1 10*3/uL (ref 3.8–10.8)

## 2023-07-03 LAB — COMPLETE METABOLIC PANEL WITH GFR
AG Ratio: 2.1 (calc) (ref 1.0–2.5)
ALT: 17 U/L (ref 6–29)
AST: 18 U/L (ref 10–35)
Albumin: 4.2 g/dL (ref 3.6–5.1)
Alkaline phosphatase (APISO): 65 U/L (ref 31–125)
BUN: 15 mg/dL (ref 7–25)
CO2: 28 mmol/L (ref 20–32)
Calcium: 9.2 mg/dL (ref 8.6–10.2)
Chloride: 105 mmol/L (ref 98–110)
Creat: 0.75 mg/dL (ref 0.50–0.99)
Globulin: 2 g/dL (ref 1.9–3.7)
Glucose, Bld: 90 mg/dL (ref 65–99)
Potassium: 4.7 mmol/L (ref 3.5–5.3)
Sodium: 141 mmol/L (ref 135–146)
Total Bilirubin: 0.4 mg/dL (ref 0.2–1.2)
Total Protein: 6.2 g/dL (ref 6.1–8.1)
eGFR: 99 mL/min/{1.73_m2} (ref >=60)

## 2023-07-03 LAB — LIPID PANEL
Cholesterol: 186 mg/dL (ref <200)
HDL: 76 mg/dL (ref >=50)
LDL Cholesterol (Calc): 96 mg/dL
Non-HDL Cholesterol (Calc): 110 mg/dL (ref <130)
Total CHOL/HDL Ratio: 2.4 (calc) (ref <5.0)
Triglycerides: 53 mg/dL (ref <150)

## 2023-07-03 LAB — MAGNESIUM: Magnesium: 2.2 mg/dL (ref 1.5–2.5)

## 2023-07-03 LAB — HEMOGLOBIN A1C
Hgb A1c MFr Bld: 5.3 %{Hb} (ref <5.7)
Mean Plasma Glucose: 105 mg/dL
eAG (mmol/L): 5.8 mmol/L

## 2023-07-03 LAB — TSH: TSH: 2.34 m[IU]/L

## 2023-07-03 NOTE — Telephone Encounter (Signed)
 Requested Prescriptions  Refused Prescriptions Disp Refills   potassium chloride  SA (KLOR-CON  M) 20 MEQ tablet [Pharmacy Med Name: POTASSIUM CL ER TABLETS] 90 tablet 1    Sig: TAKE 1 TABLET(20 MEQ) BY MOUTH DAILY     Endocrinology:  Minerals - Potassium Supplementation Passed - 07/03/2023  3:15 PM      Passed - K in normal range and within 360 days    Potassium  Date Value Ref Range Status  07/02/2023 4.7 3.5 - 5.3 mmol/L Final         Passed - Cr in normal range and within 360 days    Creat  Date Value Ref Range Status  07/02/2023 0.75 0.50 - 0.99 mg/dL Final         Passed - Valid encounter within last 12 months    Recent Outpatient Visits           4 weeks ago Viral URI with cough   [place] [facility] [place], Angeline ORN, NP   1 month ago Essential hypertension   [place] [facility] [place], Marsa PARAS, DO   2 months ago Acute non-recurrent frontal sinusitis   [place] [facility] [place] Marsa PARAS, DO   2 months ago Right ear pain   [facility] [address], PA-C   3 months ago Atopic dermatitis, unspecified type   [place] [facility] [place] Marsa PARAS, DO       Future Appointments             In 2 weeks [place], Marsa PARAS, DO [place] [facility], [facility]             Too soon to refill reordered 04/02/23 #90

## 2023-07-07 ENCOUNTER — Encounter: Payer: 59 | Admitting: Family Medicine

## 2023-07-09 ENCOUNTER — Institutional Professional Consult (permissible substitution): Payer: 59 | Admitting: Plastic Surgery

## 2023-07-13 ENCOUNTER — Ambulatory Visit
Admission: RE | Admit: 2023-07-13 | Discharge: 2023-07-13 | Disposition: A | Discharge status: Home / Self Care | Payer: 59 | Source: Ambulatory Visit | Attending: Family Medicine | Admitting: Family Medicine

## 2023-07-13 ENCOUNTER — Encounter: Payer: Self-pay | Admitting: Family Medicine

## 2023-07-13 DIAGNOSIS — Z1509 Genetic susceptibility to other malignant neoplasm: Secondary | ICD-10-CM | POA: Insufficient documentation

## 2023-07-13 DIAGNOSIS — Z1501 Genetic susceptibility to malignant neoplasm of breast: Secondary | ICD-10-CM | POA: Insufficient documentation

## 2023-07-13 DIAGNOSIS — Z1231 Encounter for screening mammogram for malignant neoplasm of breast: Secondary | ICD-10-CM | POA: Diagnosis not present

## 2023-07-22 ENCOUNTER — Ambulatory Visit: Payer: 59 | Admitting: Family Medicine

## 2023-07-22 ENCOUNTER — Encounter: Payer: Self-pay | Admitting: Family Medicine

## 2023-07-22 ENCOUNTER — Encounter: Payer: 59 | Admitting: Family Medicine

## 2023-07-22 VITALS — BP 130/84 | HR 93 | Ht 67.0 in | Wt 241.0 lb

## 2023-07-22 DIAGNOSIS — F411 Generalized anxiety disorder: Secondary | ICD-10-CM | POA: Diagnosis not present

## 2023-07-22 DIAGNOSIS — F319 Bipolar disorder, unspecified: Secondary | ICD-10-CM

## 2023-07-22 DIAGNOSIS — F902 Attention-deficit hyperactivity disorder, combined type: Secondary | ICD-10-CM

## 2023-07-22 DIAGNOSIS — K912 Postsurgical malabsorption, not elsewhere classified: Secondary | ICD-10-CM

## 2023-07-22 DIAGNOSIS — G4733 Obstructive sleep apnea (adult) (pediatric): Secondary | ICD-10-CM

## 2023-07-22 DIAGNOSIS — K1379 Other lesions of oral mucosa: Secondary | ICD-10-CM | POA: Diagnosis not present

## 2023-07-22 DIAGNOSIS — I10 Essential (primary) hypertension: Secondary | ICD-10-CM | POA: Diagnosis not present

## 2023-07-22 DIAGNOSIS — Z0000 Encounter for general adult medical examination without abnormal findings: Secondary | ICD-10-CM | POA: Diagnosis not present

## 2023-07-22 DIAGNOSIS — Z1501 Genetic susceptibility to malignant neoplasm of breast: Secondary | ICD-10-CM

## 2023-07-22 DIAGNOSIS — Z1509 Genetic susceptibility to other malignant neoplasm: Secondary | ICD-10-CM

## 2023-07-22 MED ORDER — MAGIC MOUTHWASH W/LIDOCAINE: ORAL | 0 refills | Status: DC

## 2023-07-22 NOTE — Patient Instructions (Addendum)
Thank you for coming to the office today.  Keep up with the BRCA2 work up and specialists.  Stop Potassium tablet Limit alcohol / electrolyte rehydration to help cramping   You have been referred for a Coronary Calcium Score Cardiac CT Scan. This is a screening test for patients aged 47-50+ with cardiovascular risk factors or who are healthy but would be interested in Cardiovascular Screening for heart disease. Even if there is a family history of heart disease, this imaging can be useful. Typically it can be done every 5+ years or at a different timeline we agree on  The scan will look at the chest and mainly focus on the heart and identify early signs of calcium build up or blockages within the heart arteries. It is not 100% accurate for identifying blockages or heart disease, but it is useful to help [place] predict who may have some early changes or be at risk in the future for a heart attack or cardiovascular problem.  The results are reviewed by a Cardiologist and they will document the results. It should become available on MyChart. Typically the results are divided into percentiles based on other patients of the same demographic and age. So it will compare your risk to others similar to you. If you have a higher score >99 or higher percentile >75%tile, it is recommended to consider Statin cholesterol therapy and or referral to Cardiologist. I will try to help explain your results and if we have questions we can contact the Cardiologist.  You will be contacted for scheduling. Usually it is done at any imaging facility through [facility], [facility] or [facility].  The cost is $99 flat fee total and it does not go through insurance, so no authorization is required.    Please schedule a Follow-up Appointment to: Return if symptoms worsen or fail to improve.  If you have any other questions or concerns, please feel free to call the office or send a message  through MyChart. You may also schedule an earlier appointment if necessary.  Additionally, you may be receiving a survey about your experience at our office within a few days to 1 week by e-mail or mail. We value your feedback.  Saralyn Pilar, DO [facility], [place]
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
## 2023-07-27 ENCOUNTER — Ambulatory Visit (INDEPENDENT_AMBULATORY_CARE_PROVIDER_SITE_OTHER): Payer: 59 | Admitting: Obstetrics and Gynecology

## 2023-07-27 ENCOUNTER — Encounter: Payer: Self-pay | Admitting: Obstetrics and Gynecology

## 2023-07-27 VITALS — BP 142/88 | HR 85 | Ht 67.0 in | Wt 249.0 lb

## 2023-07-27 DIAGNOSIS — Z7689 Persons encountering health services in other specified circumstances: Secondary | ICD-10-CM

## 2023-07-27 DIAGNOSIS — Z1501 Genetic susceptibility to malignant neoplasm of breast: Secondary | ICD-10-CM | POA: Diagnosis not present

## 2023-07-27 DIAGNOSIS — Z1509 Genetic susceptibility to other malignant neoplasm: Secondary | ICD-10-CM | POA: Diagnosis not present

## 2023-07-27 NOTE — Progress Notes (Addendum)
HPI:      Suzanne Wilcox is a 48 y.o. [id number] who LMP was Patient's last menstrual period was 07/12/2023.  Subjective:   She presents today because she has been found to have the BRCA2 gene.  She is considering pelvic surgery to decrease her risk of ovarian cancer.  She is scheduled to meet a plastic surgeon next week to discuss bilateral mastectomy. She currently has somewhat irregular cycles of bleeding approximately every 2 months.  She was on OCPs. She has had a prior tubal ligation.    Hx: The following portions of the patient's history were reviewed and updated as appropriate:             She  has a past medical history of Allergy, Anxiety, Bipolar disorder (HCC), Bipolar disorder (HCC), Depression, Generalized headaches, GERD (gastroesophageal reflux disease), varicella, Hyperlipidemia, Hypertension, Missed abortions, Obesity (04/29/2012), Postoperative retention of urine (03/28/2016), Postpartum care following cesarean delivery (9/29) (03/27/2016), Sleep apnea, Termination of pregnancy (fetus) (03/22/2004), and Vaginal Pap smear, abnormal. She does not have any pertinent problems on file. She  has a past surgical history that includes Cesarean section (06/29/2009); Dilation and curettage of uterus ( 2005, 2010); Wisdom tooth extraction; Cesarean section (N/A, 05/22/2014); ir generic historical (03/26/2016); ir generic historical (03/26/2016); Cesarean section with bilateral tubal ligation (Bilateral, 03/27/2016); Esophagogastroduodenoscopy (N/A, 01/11/2017); Cholecystectomy (N/A, 04/29/2017); Gastric bypass (03/2022); and Colonoscopy with propofol (N/A, 09/04/2022). Her family history includes Depression in her mother and paternal grandmother; HIV/AIDS in her father; Skin cancer in her mother and sister. She  reports that she quit smoking about 14 years ago. Her smoking use included cigarettes. She started smoking about 32 years ago. She has a 18 pack-year smoking history. She has never  used smokeless tobacco. She reports current alcohol use. She reports that she does not use drugs. She has a current medication list which includes the following prescription(s): acetaminophen-caffeine, alprazolam, amlodipine, amphetamine-dextroamphetamine, biotin, bupropion, calcium citrate, caplyta, co-enzyme q-10, vitron-c, magnesium-potassium, multiple vitamin, nystatin cream, fish oil, ondansetron, oxycodone-acetaminophen, pantoprazole, triamcinolone cream, ipratropium, magic mouthwash w/lidocaine, and methocarbamol. She is allergic to wound dressing adhesive, wound dressings, adhesive [tape], and lyrica [pregabalin].       Review of Systems:  Review of Systems  Constitutional: Denied constitutional symptoms, night sweats, recent illness, fatigue, fever, insomnia and weight loss.  Eyes: Denied eye symptoms, eye pain, photophobia, vision change and visual disturbance.  Ears/Nose/Throat/Neck: Denied ear, nose, throat or neck symptoms, hearing loss, nasal discharge, sinus congestion and sore throat.  Cardiovascular: Denied cardiovascular symptoms, arrhythmia, chest pain/pressure, edema, exercise intolerance, orthopnea and palpitations.  Respiratory: Denied pulmonary symptoms, asthma, pleuritic pain, productive sputum, cough, dyspnea and wheezing.  Gastrointestinal: Denied, gastro-esophageal reflux, melena, nausea and vomiting.  Genitourinary: Denied genitourinary symptoms including symptomatic vaginal discharge, pelvic relaxation issues, and urinary complaints.  Musculoskeletal: Denied musculoskeletal symptoms, stiffness, swelling, muscle weakness and myalgia.  Dermatologic: Denied dermatology symptoms, rash and scar.  Neurologic: Denied neurology symptoms, dizziness, headache, neck pain and syncope.  Psychiatric: Denied psychiatric symptoms, anxiety and depression.  Endocrine: Denied endocrine symptoms including hot flashes and night sweats.   Meds:   Current Outpatient Medications on File  Prior to Visit  Medication Sig Dispense Refill   ACETAMINOPHEN-CAFFEINE PO Take 1 capsule by mouth in the morning and at bedtime.     ALPRAZolam (XANAX) 1 MG tablet Take 1 mg by mouth 2 (two) times daily as needed for anxiety.     amLODipine (NORVASC) 5 MG tablet Take 1 tablet (5 mg  total) by mouth daily. 90 tablet 1   amphetamine-dextroamphetamine (ADDERALL) 30 MG tablet Take 30 mg by mouth daily.     Biotin 1 MG CAPS Take by mouth.     buPROPion (WELLBUTRIN) 100 MG tablet Take 100 mg by mouth 2 (two) times daily. 2 in the morning and 2 in the afternoon     calcium citrate (CALCITRATE - DOSED IN MG ELEMENTAL CALCIUM) 950 (200 Ca) MG tablet Take 200 mg of elemental calcium by mouth daily.     CAPLYTA 42 MG capsule Take 42 mg by mouth at bedtime.     co-enzyme Q-10 30 MG capsule Take 30 mg by mouth 3 (three) times daily.     Iron-Vitamin C (VITRON-C) 65-125 MG TABS Take by mouth.     Magnesium-Potassium 40-40 MG CAPS Take by mouth.     Multiple Vitamin (MULTI-VITAMIN DAILY PO) Take by mouth.     nystatin cream (MYCOSTATIN) Apply 1 Application topically 2 (two) times daily.     Omega-3 Fatty Acids (FISH OIL) 300 MG CAPS Take by mouth.     ondansetron (ZOFRAN) 8 MG tablet Take 8 mg by mouth every 8 (eight) hours as needed for nausea or vomiting. Pt has not started medication yet     oxyCODONE-acetaminophen (PERCOCET/ROXICET) 5-325 MG tablet Take 1 tablet by mouth every 12 (twelve) hours. Maximum daily dose is 2 tablets 60 tablet 0   pantoprazole (PROTONIX) 40 MG tablet Take 1 tablet (40 mg total) by mouth 2 (two) times daily before a meal. 60 tablet 2   triamcinolone cream (KENALOG) 0.5 % Apply 1 Application topically 2 (two) times daily. To affected areas, for up to 2 weeks. 30 g 0   ipratropium (ATROVENT) 0.06 % nasal spray Place 2 sprays into both nostrils 4 (four) times daily. For up to 5-7 days then stop. (Patient not taking: Reported on 07/27/2023) 15 mL 0   magic mouthwash w/lidocaine SOLN  Swish, gargle, and spit one to two teaspoonfuls every six hours as needed. Shake well before using. (Patient not taking: Reported on 07/27/2023) 120 mL 0   methocarbamol (ROBAXIN) 750 MG tablet Take 1 tablet (750 mg total) by mouth every 8 (eight) hours as needed for muscle spasms. (Patient not taking: Reported on 07/27/2023) 90 tablet 1   No current facility-administered medications on file prior to visit.      Objective:     Vitals:   07/27/23 1003  BP: (!) 147/87  Pulse: 98   Filed Weights   07/27/23 1003  Weight: 249 lb (112.9 kg)                        Assessment:    [id number] Patient Active Problem List   Diagnosis Date Noted   BRCA2 gene mutation positive 06/11/2023   Chronic neck pain 04/02/2023   Lymphedema 03/02/2023   Varicose veins of leg with pain, right 03/02/2023   Encounter for screening colonoscopy 09/04/2022   Essential hypertension 10/29/2021   Bilateral lower extremity edema 10/29/2021   Class 3 severe obesity due to excess calories without serious comorbidity with body mass index (BMI) of 50.0 to 59.9 in adult [facility]) 10/29/2021   OSA (obstructive sleep apnea) 10/29/2021   Attention deficit hyperactivity disorder (ADHD), combined type 10/29/2021   GAD (generalized anxiety disorder) 10/29/2021   Excessive daytime sleepiness 12/16/2020   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 Establishing care with new doctor, encounter for   2. BRCA2 gene mutation positive        Plan:            1.  We have discussed multiple options regarding her BRCA2 genetic mutation and its relationship to pelvis.  Her primary pelvic risk is for ovarian  CA.  We have discussed the decreased risk with tubal ligation.  We have also discussed a decreased risk with OCPs. She will discuss with her plastic surgeon use of estrogen after mastectomy.  This may determine which direction she would like to go. Oophorectomy rather than LAVH BSO discussed. Questions answered. Once she gets more information we can determine what seems to be the best course of action for her and she can make better decisions.  2.  Screening ovarian ultrasound and Roma testing ordered. Orders Orders Placed This Encounter  Procedures   Ovarian Malignancy Risk-ROMA    No orders of the defined types were placed in this encounter.     F/U  Return for We will contact her with any abnormal test results.  Suzanne Wilcox, M.D. 07/27/2023 10:54 AM

## 2023-07-27 NOTE — Progress Notes (Signed)
Patient presents today to discuss hysterectomy and oophorectomy. She completed genetic screening with resulted in a positive BRCA2 gene. Patient also states concerns of irregular cycles over the past 2 years, BTL for birth control.

## 2023-07-28 ENCOUNTER — Encounter (INDEPENDENT_AMBULATORY_CARE_PROVIDER_SITE_OTHER): Payer: Self-pay

## 2023-07-28 ENCOUNTER — Encounter: Payer: Self-pay | Admitting: Oncology

## 2023-07-28 LAB — OVARIAN MALIGNANCY RISK-ROMA
Cancer Antigen (CA) 125: 23.8 U/mL (ref 0.0–38.1)
HE4: 66.3 pmol/L — ABNORMAL HIGH (ref 0.0–63.6)
Postmenopausal ROMA: 1.97
Premenopausal ROMA: 1.4 — ABNORMAL HIGH

## 2023-07-28 LAB — POSTMENOPAUSAL INTERP: LOW

## 2023-07-28 LAB — PREMENOPAUSAL INTERP: HIGH

## 2023-07-29 ENCOUNTER — Encounter: Payer: Self-pay | Admitting: Obstetrics and Gynecology

## 2023-07-29 ENCOUNTER — Other Ambulatory Visit: Payer: Self-pay | Admitting: *Deleted

## 2023-07-29 DIAGNOSIS — Z1509 Genetic susceptibility to other malignant neoplasm: Secondary | ICD-10-CM

## 2023-07-30 ENCOUNTER — Encounter: Payer: Self-pay | Admitting: Family Medicine

## 2023-07-30 ENCOUNTER — Ambulatory Visit
Admission: RE | Admit: 2023-07-30 | Discharge: 2023-07-30 | Disposition: A | Discharge status: Home / Self Care | Payer: Self-pay | Source: Ambulatory Visit | Attending: Family Medicine | Admitting: Family Medicine

## 2023-07-30 DIAGNOSIS — I10 Essential (primary) hypertension: Secondary | ICD-10-CM | POA: Insufficient documentation

## 2023-08-03 ENCOUNTER — Ambulatory Visit
Admission: RE | Admit: 2023-08-03 | Discharge: 2023-08-03 | Disposition: A | Discharge status: Home / Self Care | Payer: 59 | Source: Ambulatory Visit | Attending: Obstetrics and Gynecology | Admitting: Obstetrics and Gynecology

## 2023-08-03 DIAGNOSIS — Z1501 Genetic susceptibility to malignant neoplasm of breast: Secondary | ICD-10-CM | POA: Insufficient documentation

## 2023-08-03 DIAGNOSIS — Z1509 Genetic susceptibility to other malignant neoplasm: Secondary | ICD-10-CM | POA: Diagnosis present

## 2023-08-04 ENCOUNTER — Inpatient Hospital Stay: Payer: 59 | Attending: Oncology | Admitting: Obstetrics and Gynecology

## 2023-08-04 ENCOUNTER — Other Ambulatory Visit: Payer: Self-pay | Admitting: Nurse Practitioner

## 2023-08-04 ENCOUNTER — Encounter: Payer: Self-pay | Admitting: Obstetrics and Gynecology

## 2023-08-04 VITALS — BP 143/83 | HR 82 | Temp 98.7°F | Resp 20

## 2023-08-04 DIAGNOSIS — K219 Gastro-esophageal reflux disease without esophagitis: Secondary | ICD-10-CM | POA: Insufficient documentation

## 2023-08-04 DIAGNOSIS — Z808 Family history of malignant neoplasm of other organs or systems: Secondary | ICD-10-CM | POA: Diagnosis not present

## 2023-08-04 DIAGNOSIS — F319 Bipolar disorder, unspecified: Secondary | ICD-10-CM | POA: Insufficient documentation

## 2023-08-04 DIAGNOSIS — Z1509 Genetic susceptibility to other malignant neoplasm: Secondary | ICD-10-CM | POA: Insufficient documentation

## 2023-08-04 DIAGNOSIS — I83811 Varicose veins of right lower extremities with pain: Secondary | ICD-10-CM | POA: Diagnosis not present

## 2023-08-04 DIAGNOSIS — Z1502 Genetic susceptibility to malignant neoplasm of ovary: Secondary | ICD-10-CM | POA: Diagnosis not present

## 2023-08-04 DIAGNOSIS — F411 Generalized anxiety disorder: Secondary | ICD-10-CM | POA: Diagnosis not present

## 2023-08-04 DIAGNOSIS — Z6841 Body Mass Index (BMI) 40.0 and over, adult: Secondary | ICD-10-CM | POA: Insufficient documentation

## 2023-08-04 DIAGNOSIS — N939 Abnormal uterine and vaginal bleeding, unspecified: Secondary | ICD-10-CM | POA: Diagnosis not present

## 2023-08-04 DIAGNOSIS — Z7189 Other specified counseling: Secondary | ICD-10-CM

## 2023-08-04 DIAGNOSIS — N926 Irregular menstruation, unspecified: Secondary | ICD-10-CM

## 2023-08-04 DIAGNOSIS — N83201 Unspecified ovarian cyst, right side: Secondary | ICD-10-CM | POA: Diagnosis present

## 2023-08-04 DIAGNOSIS — R590 Localized enlarged lymph nodes: Secondary | ICD-10-CM | POA: Diagnosis not present

## 2023-08-04 DIAGNOSIS — D252 Subserosal leiomyoma of uterus: Secondary | ICD-10-CM | POA: Insufficient documentation

## 2023-08-04 DIAGNOSIS — Z87891 Personal history of nicotine dependence: Secondary | ICD-10-CM | POA: Insufficient documentation

## 2023-08-04 DIAGNOSIS — G4733 Obstructive sleep apnea (adult) (pediatric): Secondary | ICD-10-CM | POA: Diagnosis not present

## 2023-08-04 DIAGNOSIS — E66813 Obesity, class 3: Secondary | ICD-10-CM | POA: Diagnosis not present

## 2023-08-04 DIAGNOSIS — Z79899 Other long term (current) drug therapy: Secondary | ICD-10-CM | POA: Insufficient documentation

## 2023-08-04 DIAGNOSIS — Z148 Genetic carrier of other disease: Secondary | ICD-10-CM | POA: Diagnosis not present

## 2023-08-04 DIAGNOSIS — R978 Other abnormal tumor markers: Secondary | ICD-10-CM | POA: Diagnosis not present

## 2023-08-04 DIAGNOSIS — Z1501 Genetic susceptibility to malignant neoplasm of breast: Secondary | ICD-10-CM | POA: Insufficient documentation

## 2023-08-04 DIAGNOSIS — F429 Obsessive-compulsive disorder, unspecified: Secondary | ICD-10-CM | POA: Insufficient documentation

## 2023-08-04 DIAGNOSIS — I10 Essential (primary) hypertension: Secondary | ICD-10-CM | POA: Insufficient documentation

## 2023-08-04 MED ORDER — METRONIDAZOLE IVPB CUSTOM: 500.0000 mg | Freq: Once | INTRAVENOUS | Status: DC

## 2023-08-04 NOTE — Patient Instructions (Signed)
 Your surgery will be scheduled August 10, 2022 You will have a pre-admission telephone visit prior to surgery. This visit will take around 40-45 minutes. Gyn oncology will see you 4-6 weeks following surgery for a post operative visit.          Laparoscopy Laparoscopy is a procedure to diagnose diseases in the abdomen. During the procedure, a thin, lighted, pencil-sized instrument called a laparoscope is inserted into the abdomen through an incision. The laparoscope allows your health care provider to look at the organs inside your body. LET [facility] CARE PROVIDER KNOW ABOUT: Any allergies you have. All medicines you are taking, including vitamins, herbs, eye drops, creams, and over-the-counter medicines. Previous problems you or members of your family have had with the use of anesthetics. Any blood disorders you have. Previous surgeries you have had. Medical conditions you have. RISKS AND COMPLICATIONS  Generally, this is a safe procedure. However, problems can occur, which may include: Infection. Bleeding. Damage to other organs. Allergic reaction to the anesthetics used during the procedure. BEFORE THE PROCEDURE Do not eat or drink anything after midnight on the night before the procedure or as directed by your health care provider. Ask your health care provider about: Changing or stopping your regular medicines. Taking medicines such as aspirin and ibuprofen . These medicines can thin your blood. Do not take these medicines before your procedure if your health care provider instructs you not to. Plan to have someone take you home after the procedure. PROCEDURE You may be given a medicine to help you relax (sedative). You will be given a medicine to make you sleep (general anesthetic). Your abdomen will be inflated with a gas. This will make your organs easier to see. Small incisions will be made in your abdomen. A laparoscope and other small instruments will be inserted into  the abdomen through the incisions. A tissue sample may be removed from an organ in the abdomen for examination. The instruments will be removed from the abdomen. The gas will be released. The incisions will be closed with stitches (sutures). AFTER THE PROCEDURE  Your blood pressure, heart rate, breathing rate, and blood oxygen level will be monitored often until the medicines you were given have worn off.   This information is not intended to replace advice given to you by your health care provider. Make sure you discuss any questions you have with your health care provider.                                             Bowel Symptoms After Surgery After gynecologic surgery, women often have temporary changes in bowel function (constipation and gas pain).  Following are tips to help prevent and treat common bowel problems.  It also tells you when to call the doctor.  This is important because some symptoms might be a sign of a more serious bowel problem such as obstruction (bowel blockage).  These problems are rare but can happen after gynecologic surgery.   Besides surgery, what can temporarily affect bowel function? 1. Dietary changes   2. Decreased physical activity   3.Antibiotics   4. Pain medication   How can I prevent constipation (three days or more without a stool)? Include fiber in your diet: whole grains, raw or dried fruits & vegetables, prunes, prune/pear juiceDrink at least 8 glasses of liquid (preferably water) every day Avoid:  Gas forming foods such as broccoli, beans, peas, salads, cabbage, sweet potatoes Greasy, fatty, or fried foods Activity helps bowel function return to normal, walk around the house at least 3-4 times each day for 15 minutes or longer, if tolerated.  Rocking in a rocking chair is preferable to sitting still. Stool softeners: these are not laxatives, but serve to soften the stool to avoid straining.  Take 2-4 times a day until normal bowel function returns          Examples: Colace or generic equivalent (Docusate) Bulk laxatives: provide a concentrated source of fiber.  They do not stimulate the bowel.  Take 1-2 times each day until normal bowel function return.              Examples: Citrucel, Metamucil, Fiberal, Fibercon   What can I take for "Gas Pains"? Simethicone  (Mylicon, Gas-X, Maalox-Gas, Mylanta-Gas) take 3-4 times a day Maalox Regular - take 3-4 times a day Mylanta Regular - take 3-4 times a day   What can I take if I become constipated? Start with stool softeners and add additional laxatives below as needed to have a bowel movement every 1-2 days  Stool softeners 1-2 tablets, 2 times a day Senokot 1-2 tablets, 1-2 times a day Glycerin  suppository can soften hard stool take once a day Bisacodyl suppository once a day  Milk of Magnesia 30 mL 1-2 times a day Fleets or tap water enema    What can I do for nausea?  Limit most solid foods for 24-48 hours Continue eating small frequent amounts of liquids and/or bland soft foods Toast, crackers, cooked cereal (grits, cream of wheat, rice) Benadryl : a mild anti-nausea medicine can be obtained without a prescription. May cause drowsiness, especially if taken with narcotic pain medicines Contact provider for prescription nausea medication     What can I do, or take for diarrhea (more than five loose stools per day)? Drink plenty of clear fluids to prevent dehydration May take Kaopectate, Pepto-Bismol, Imodium, or probiotics for 1-2 days Anusol or Preparation-H can be helpful for hemorrhoids and irritated tissue around anus   When should I call the doctor?             CONSTIPATION:  Not relieved after three days following the above program VOMITING: That contains blood, "coffee ground" material More the three times/hour and unable to keep down nausea medication for more than eight hours With dry mouth, dark or strong urine, feeling light-headed, dizzy, or confused With severe  abdominal pain or bloating for more than 24 hours DIARRHEA: That continues for more then 24-48 hours despite treatment That contains blood or tarry material With dry mouth, dark or strong urine, feeling light~headed, dizzy, or confused FEVER: 101 F or higher along with nausea, vomiting, gas pain, diarrhea UNABLE TO: Pass gas from rectum for more than 24 hours Tolerate liquids by mouth for more than 24 hours        Laparoscopic Hysterectomy, Care After Refer to this sheet in the next few weeks. These instructions provide you with information on caring for yourself after your procedure. Your health care provider may also give you more specific instructions. Your treatment has been planned according to current medical practices, but problems sometimes occur. Call your health care provider if you have any problems or questions after your procedure. What can I expect after the procedure? Pain and bruising at the incision sites. You will be given pain medicine to control it. Menopausal symptoms such as hot  flashes, night sweats, and insomnia if your ovaries were removed. Sore throat from the breathing tube that was inserted during surgery. Follow these instructions at home: Only take over-the-counter or prescription medicines for pain, discomfort, or fever as directed by your health care provider. Do not take aspirin. It can cause bleeding. Do not drive when taking pain medicine. Follow your health care provider's advice regarding diet, exercise, lifting, driving, and general activities. Resume your usual diet as directed and allowed. Get plenty of rest and sleep. Do not douche, use tampons, or have sexual intercourse for at least 6 weeks, or until your health care provider gives you permission. Change your bandages (dressings) as directed by your health care provider. Monitor your temperature and notify your health care provider of a fever. Take showers instead of baths for 2-3 weeks. Do  not drink alcohol until your health care provider gives you permission. If you develop constipation, you may take a mild laxative with your health care provider's permission. Bran foods may help with constipation problems. Drinking enough fluids to keep your urine clear or pale yellow may help as well. Try to have someone home with you for 1-2 weeks to help around the house. Keep all of your follow-up appointments as directed by your health care provider. Contact a health care provider if: You have swelling, redness, or increasing pain around your incision sites. You have pus coming from your incision. You notice a bad smell coming from your incision. Your incision breaks open. You feel dizzy or lightheaded. You have pain or bleeding when you urinate. You have persistent diarrhea. You have persistent nausea and vomiting. You have abnormal vaginal discharge. You have a rash. You have any type of abnormal reaction or develop an allergy to your medicine. You have poor pain control with your prescribed medicine. Get help right away if: You have chest pain or shortness of breath. You have severe abdominal pain that is not relieved with pain medicine. You have pain or swelling in your legs. This information is not intended to replace advice given to you by your health care provider. Make sure you discuss any questions you have with your health care provider. Document Released: 04/05/2013 Document Revised: 11/21/2015 Document Reviewed: 01/03/2013 Elsevier Interactive Patient Education  2017 [employer].

## 2023-08-04 NOTE — Addendum Note (Signed)
 Addended by: Ezell Hollow on: 08/04/2023 02:45 PM   Modules accepted: Orders
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
## 2023-08-05 ENCOUNTER — Ambulatory Visit
Admission: RE | Admit: 2023-08-05 | Discharge: 2023-08-05 | Disposition: A | Discharge status: Home / Self Care | Payer: 59 | Source: Ambulatory Visit | Attending: Nurse Practitioner | Admitting: Nurse Practitioner

## 2023-08-05 DIAGNOSIS — Z1501 Genetic susceptibility to malignant neoplasm of breast: Secondary | ICD-10-CM | POA: Diagnosis present

## 2023-08-05 DIAGNOSIS — K769 Liver disease, unspecified: Secondary | ICD-10-CM | POA: Insufficient documentation

## 2023-08-05 DIAGNOSIS — K449 Diaphragmatic hernia without obstruction or gangrene: Secondary | ICD-10-CM | POA: Insufficient documentation

## 2023-08-05 DIAGNOSIS — Z1509 Genetic susceptibility to other malignant neoplasm: Secondary | ICD-10-CM | POA: Diagnosis present

## 2023-08-05 DIAGNOSIS — K429 Umbilical hernia without obstruction or gangrene: Secondary | ICD-10-CM | POA: Insufficient documentation

## 2023-08-05 DIAGNOSIS — R591 Generalized enlarged lymph nodes: Secondary | ICD-10-CM | POA: Diagnosis not present

## 2023-08-05 MED ORDER — IOHEXOL 9 MG/ML PO SOLN
500.0000 mL | ORAL | Status: AC
  Administered 2023-08-05: 500 mL via ORAL

## 2023-08-05 MED ORDER — IOHEXOL 350 MG/ML SOLN
100.0000 mL | Freq: Once | INTRAVENOUS | Status: AC | PRN
  Administered 2023-08-05: 100 mL via INTRAVENOUS

## 2023-08-06 ENCOUNTER — Encounter: Payer: Self-pay | Admitting: Plastic Surgery

## 2023-08-06 ENCOUNTER — Ambulatory Visit (INDEPENDENT_AMBULATORY_CARE_PROVIDER_SITE_OTHER): Payer: 59 | Admitting: Plastic Surgery

## 2023-08-06 ENCOUNTER — Encounter: Payer: Self-pay | Admitting: Nurse Practitioner

## 2023-08-06 ENCOUNTER — Ambulatory Visit: Payer: 59

## 2023-08-06 VITALS — BP 141/85 | HR 82 | Ht 67.5 in | Wt 248.6 lb

## 2023-08-06 DIAGNOSIS — N6481 Ptosis of breast: Secondary | ICD-10-CM

## 2023-08-06 DIAGNOSIS — Z1501 Genetic susceptibility to malignant neoplasm of breast: Secondary | ICD-10-CM | POA: Diagnosis not present

## 2023-08-06 DIAGNOSIS — Z0000 Encounter for general adult medical examination without abnormal findings: Secondary | ICD-10-CM | POA: Diagnosis not present

## 2023-08-06 DIAGNOSIS — Z1509 Genetic susceptibility to other malignant neoplasm: Secondary | ICD-10-CM | POA: Diagnosis not present

## 2023-08-06 DIAGNOSIS — I890 Lymphedema, not elsewhere classified: Secondary | ICD-10-CM

## 2023-08-06 DIAGNOSIS — F319 Bipolar disorder, unspecified: Secondary | ICD-10-CM

## 2023-08-06 DIAGNOSIS — F1910 Other psychoactive substance abuse, uncomplicated: Secondary | ICD-10-CM | POA: Diagnosis not present

## 2023-08-06 NOTE — Patient Instructions (Addendum)
 Suzanne Wilcox , Thank you for taking time to come for your Medicare Wellness Visit. I appreciate your ongoing commitment to your health goals. Please review the following plan we discussed and let me know if I can assist you in the future.   Referrals/Orders/Follow-Ups/Clinician Recommendations: NONE  This is a list of the screening recommended for you and due dates:  Health Maintenance  Topic Date Due   COVID-19 Vaccine (1) Never done   Hepatitis C Screening  Never done   Pap with HPV screening  Never done   Flu Shot  09/27/2023*   Medicare Annual Wellness Visit  08/05/2024   DTaP/Tdap/Td vaccine (3 - Td or Tdap) 01/14/2026   Colon Cancer Screening  09/03/2032   HIV Screening  Completed   HPV Vaccine  Aged Out   Cologuard (Stool DNA test)  Discontinued  *Topic was postponed. The date shown is not the original due date.    Advanced directives: (Copy Requested) Please bring a copy of your health care power of attorney and living will to the office to be added to your chart at your convenience.  Next Medicare Annual Wellness Visit scheduled for next year: Yes   08/11/24 @ 8:50 AM BY PHONE

## 2023-08-06 NOTE — Progress Notes (Signed)
 Patient ID: Suzanne Wilcox, female    DOB: August 22, 1975, 48 y.o.   MRN: [id number]   Chief Complaint  Patient presents with   Advice Only    The patient is a 48 year old female here for consultation for breast reconstruction.  She is 5 feet 7 inches tall and weighs 248 pounds.  This is down from 396 pounds.  She quit smoking about 15 years ago.  She is about a J/H bra size and would like to be a D or a double D.  She does not know if there is a family history of breast cancer.  She went for evaluation and was found to have BRCA2 positive gene and had an MRI which was clear.  She was seeing Dr. Jacobo.  Does not sound like she has a general urgent yet.  She has severe ptosis of both breasts with a tattoo on the left breast.  Her past medical history is positive for anxiety, bipolar, depression, headaches, reflux, sleep apnea, hyperlipidemia and hypertension.  She has had a C-section, a gastric bypass, a cholecystectomy, and wisdom teeth removed.  The patient is interested in reconstruction.     Review of Systems  Constitutional: Negative.   HENT: Negative.    Eyes: Negative.   Respiratory: Negative.    Cardiovascular: Negative.   Gastrointestinal: Negative.   Endocrine: Negative.   Genitourinary: Negative.   Musculoskeletal: Negative.     Past Medical History:  Diagnosis Date   Allergy    Anxiety    Bipolar disorder (HCC)    Bipolar disorder (HCC)    Depression    Generalized headaches    GERD (gastroesophageal reflux disease)    Hx of varicella    Hyperlipidemia    Hypertension    Missed abortions    X 6   Obesity 04/29/2012   Postoperative retention of urine 03/28/2016   Postpartum care following cesarean delivery (9/29) 03/27/2016   Sleep apnea    Termination of pregnancy (fetus) 03/22/2004   X 1   Vaginal Pap smear, abnormal     Past Surgical History:  Procedure Laterality Date   CESAREAN SECTION  06/29/2009   X 1 [facility] [place]   CESAREAN SECTION N/A 05/22/2014    Procedure: Repeat CESAREAN SECTION;  Surgeon: Charlie JINNY Flowers, MD;  Location: [facility];  Service: Obstetrics;  Laterality: N/A;  EDD: 05/31/14   CESAREAN SECTION WITH BILATERAL TUBAL LIGATION Bilateral 03/27/2016   Procedure: Repeat CESAREAN SECTION WITH BILATERAL TUBAL LIGATION;  Surgeon: Charlie Flowers, MD;  Location: [facility];  Service: Obstetrics;  Laterality: Bilateral;  EDD: 04/01/16   CHOLECYSTECTOMY N/A 04/29/2017   Procedure: LAPAROSCOPIC CHOLECYSTECTOMY WITH INTRAOPERATIVE CHOLANGIOGRAM;  Surgeon: Ethyl Lenis, MD;  Location: [facility];  Service: General;  Laterality: N/A;  ERAS PATHWAY   COLONOSCOPY WITH PROPOFOL  N/A 09/04/2022   Procedure: COLONOSCOPY WITH PROPOFOL ;  Surgeon: Unk Corinn Skiff, MD;  Location: [facility];  Service: Gastroenterology;  Laterality: N/A;   DILATION AND CURETTAGE OF UTERUS   2005, 2010     x2 [facility]   ESOPHAGOGASTRODUODENOSCOPY N/A 01/11/2017   Procedure: ESOPHAGOGASTRODUODENOSCOPY (EGD);  Surgeon: Saintclair Jasper, MD;  Location: [facility];  Service: Gastroenterology;  Laterality: N/A;   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   IR GENERIC HISTORICAL  03/26/2016   IR FLUORO GUIDE CV LINE RIGHT 03/26/2016 [facility]   IR GENERIC HISTORICAL  03/26/2016   IR US  GUIDE VASC ACCESS RIGHT 03/26/2016 [facility]   WISDOM TOOTH EXTRACTION  Current Outpatient Medications:    acetaminophen -caffeine (EXCEDRIN TENSION HEADACHE) 500-65 MG TABS per tablet, Take 2 tablets by mouth daily. May take another 2 tablet dose throughout the day as needed for a headache, Disp: , Rfl:    ALPRAZolam (XANAX) 1 MG tablet, Take 1 mg by mouth 3 (three) times daily as needed for anxiety., Disp: , Rfl:    amLODipine  (NORVASC ) 5 MG tablet, Take 1 tablet (5 mg total) by mouth daily., Disp: 90 tablet, Rfl: 1   amphetamine-dextroamphetamine (ADDERALL) 30 MG tablet, Take 30 mg by mouth daily. May take a second 30 mg dose in the afternoon as needed for attention, Disp: , Rfl:    buPROPion   (WELLBUTRIN ) 100 MG tablet, Take 100-200 mg by mouth See admin instructions. Take 200 mg in the morning and 100 mg in the evening, Disp: , Rfl:    Calcium Citrate-Vitamin D (CALCIUM CITRATE CHEWY BITE PO), Take 1 tablet by mouth 3 (three) times daily., Disp: , Rfl:    CAPLYTA 42 MG capsule, Take 42 mg by mouth at bedtime., Disp: , Rfl:    Coenzyme Q10 (COQ-10 PO), Take 1 tablet by mouth daily., Disp: , Rfl:    Iron-Vitamin C (VITRON-C) 65-125 MG TABS, Take 1 tablet by mouth 2 (two) times daily., Disp: , Rfl:    magic mouthwash w/lidocaine  SOLN, Swish, gargle, and spit one to two teaspoonfuls every six hours as needed. Shake well before using., Disp: 120 mL, Rfl: 0   MAGNESIUM-POTASSIUM PO, Take 1 tablet by mouth daily., Disp: , Rfl:    Multiple Vitamins-Minerals (BARIATRIC MULTIVITAMINS/IRON) CAPS, Take 1 capsule by mouth daily., Disp: , Rfl:    nystatin (MYCOSTATIN/NYSTOP) powder, Apply 1 Application topically 2 (two) times daily as needed (rash)., Disp: , Rfl:    nystatin cream (MYCOSTATIN), Apply 1 Application topically 2 (two) times daily as needed (rash)., Disp: , Rfl:    Omega-3 Fatty Acids (FISH OIL PO), Take 1 capsule by mouth daily., Disp: , Rfl:    ondansetron  (ZOFRAN ) 8 MG tablet, Take 8 mg by mouth every 8 (eight) hours as needed for nausea or vomiting. Pt has not started medication yet, Disp: , Rfl:    oxyCODONE -acetaminophen  (PERCOCET/ROXICET) 5-325 MG tablet, Take 1 tablet by mouth every 12 (twelve) hours. Maximum daily dose is 2 tablets (Patient taking differently: Take 1 tablet by mouth every 12 (twelve) hours as needed for moderate pain (pain score 4-6). Maximum daily dose is 2 tablets), Disp: 60 tablet, Rfl: 0   pantoprazole  (PROTONIX ) 40 MG tablet, Take 1 tablet (40 mg total) by mouth 2 (two) times daily before a meal., Disp: 60 tablet, Rfl: 2   Phenylephrine -Acetaminophen  (TYLENOL  SINUS CONGESTION/PAIN PO), Take 2 tablets by mouth daily as needed (congestion)., Disp: , Rfl:     Objective:   Vitals:   08/06/23 1157  BP: (!) 141/85  Pulse: 82  SpO2: 98%    Physical Exam Vitals and nursing note reviewed.  Constitutional:      Appearance: Normal appearance.  HENT:     Head: Atraumatic.  Cardiovascular:     Rate and Rhythm: Normal rate.     Pulses: Normal pulses.  Pulmonary:     Effort: Pulmonary effort is normal.  Abdominal:     Palpations: Abdomen is soft.  Musculoskeletal:        General: No swelling or deformity.  Skin:    General: Skin is warm.     Capillary Refill: Capillary refill takes less than 2 seconds.  Coloration: Skin is not jaundiced.     Findings: No bruising.  Neurological:     Mental Status: She is alert and oriented to person, place, and time.  Psychiatric:        Mood and Affect: Mood normal.        Behavior: Behavior normal.        Thought Content: Thought content normal.        Judgment: Judgment normal.     Assessment & Plan:  Bipolar 1 disorder (HCC)  Substance abuse (HCC)  BRCA2 gene mutation positive  Lymphedema  With the patient's medical history and previous surgeries I do not think she will be a good candidate for Diep flap.  She is going to think about whether or not she wants to have the consult.  We spent the majority of the time talking about implant-based reconstruction.  It can go over the muscle or under the muscle.  She is going to think things over and will plan to talk in about a month after she has spoken with a development worker, international aid.  But the overall plan is for bilateral breast reconstruction after bilateral mastectomies for BRCA to positive gene mutation.  She is also planning on a hysterectomy so she we will have that before the breast surgery.  Pictures were obtained of the patient and placed in the chart with the patient's or guardian's permission.   Estefana RAMAN Deaundre Allston, DO

## 2023-08-06 NOTE — Progress Notes (Signed)
 Subjective:   Suzanne Wilcox is a 48 y.o. female who presents for Medicare Annual (Subsequent) preventive examination.  Visit Complete: Virtual I connected with  Suzanne Wilcox on 08/06/23 by a audio enabled telemedicine application and verified that I am speaking with the correct person using two identifiers.  This patient declined Interactive audio and acupuncturist. Therefore the visit was completed with audio only.   Patient Location: Home  Provider Location: Office/Clinic  I discussed the limitations of evaluation and management by telemedicine. The patient expressed understanding and agreed to proceed.  Vital Signs: Because this visit was a virtual/telehealth visit, some criteria may be missing or patient reported. Any vitals not documented were not able to be obtained and vitals that have been documented are patient reported.  Cardiac Risk Factors include: advanced age (>25men, >72 women);hypertension;sedentary lifestyle;obesity (BMI >30kg/m2)     Objective:    Today's Vitals   08/06/23 0856  PainSc: 3    There is no height or weight on file to calculate BMI.     08/06/2023    9:02 AM 08/04/2023   11:09 AM 06/17/2023   11:09 AM 09/04/2022   10:53 AM 07/31/2022    8:48 AM 10/17/2021   11:12 AM 12/16/2020    8:28 PM  Advanced Directives  Does Patient Have a Medical Advance Directive? Yes Yes Yes Yes No No No  Type of Estate Agent of [place];Living will  Healthcare Power of [place];Living will      Does patient want to make changes to medical advance directive? No - Patient declined Yes (ED - Information included in AVS)   No - Patient declined    Copy of Healthcare Power of Attorney in Chart? No - copy requested        Would patient like information on creating a medical advance directive?     No - Patient declined No - Patient declined Yes (MAU/Ambulatory/Procedural Areas - Information given)    Current Medications (verified) Outpatient  Encounter Medications as of 08/06/2023  Medication Sig   acetaminophen -caffeine (EXCEDRIN TENSION HEADACHE) 500-65 MG TABS per tablet Take 2 tablets by mouth daily. May take another 2 tablet dose throughout the day as needed for a headache   ALPRAZolam (XANAX) 1 MG tablet Take 1 mg by mouth 3 (three) times daily as needed for anxiety.   amLODipine  (NORVASC ) 5 MG tablet Take 1 tablet (5 mg total) by mouth daily.   buPROPion  (WELLBUTRIN ) 100 MG tablet Take 100-200 mg by mouth See admin instructions. Take 200 mg in the morning and 100 mg in the evening   Calcium Citrate-Vitamin D (CALCIUM CITRATE CHEWY BITE PO) Take 1 tablet by mouth 3 (three) times daily.   CAPLYTA 42 MG capsule Take 42 mg by mouth at bedtime.   Coenzyme Q10 (COQ-10 PO) Take 1 tablet by mouth daily.   Iron-Vitamin C (VITRON-C) 65-125 MG TABS Take 1 tablet by mouth 2 (two) times daily.   magic mouthwash w/lidocaine  SOLN Swish, gargle, and spit one to two teaspoonfuls every six hours as needed. Shake well before using.   MAGNESIUM-POTASSIUM PO Take 1 tablet by mouth daily.   Multiple Vitamins-Minerals (BARIATRIC MULTIVITAMINS/IRON) CAPS Take 1 capsule by mouth daily.   nystatin (MYCOSTATIN/NYSTOP) powder Apply 1 Application topically 2 (two) times daily as needed (rash).   nystatin cream (MYCOSTATIN) Apply 1 Application topically 2 (two) times daily as needed (rash).   Omega-3 Fatty Acids (FISH OIL PO) Take 1 capsule by mouth daily.  ondansetron  (ZOFRAN ) 8 MG tablet Take 8 mg by mouth every 8 (eight) hours as needed for nausea or vomiting. Pt has not started medication yet   oxyCODONE -acetaminophen  (PERCOCET/ROXICET) 5-325 MG tablet Take 1 tablet by mouth every 12 (twelve) hours. Maximum daily dose is 2 tablets (Patient taking differently: Take 1 tablet by mouth every 12 (twelve) hours as needed for moderate pain (pain score 4-6). Maximum daily dose is 2 tablets)   pantoprazole  (PROTONIX ) 40 MG tablet Take 1 tablet (40 mg total) by  mouth 2 (two) times daily before a meal.   Phenylephrine -Acetaminophen  (TYLENOL  SINUS CONGESTION/PAIN PO) Take 2 tablets by mouth daily as needed (congestion).   amphetamine-dextroamphetamine (ADDERALL) 30 MG tablet Take 30 mg by mouth daily. May take a second 30 mg dose in the afternoon as needed for attention   No facility-administered encounter medications on file as of 08/06/2023.    Allergies (verified) Wound dressing adhesive, Wound dressings, Adhesive [tape], and Lyrica  [pregabalin ]   History: Past Medical History:  Diagnosis Date   Allergy    Anxiety    Bipolar disorder (HCC)    Bipolar disorder (HCC)    Depression    Generalized headaches    GERD (gastroesophageal reflux disease)    Hx of varicella    Hyperlipidemia    Hypertension    Missed abortions    X 6   Obesity 04/29/2012   Postoperative retention of urine 03/28/2016   Postpartum care following cesarean delivery (9/29) 03/27/2016   Sleep apnea    Termination of pregnancy (fetus) 03/22/2004   X 1   Vaginal Pap smear, abnormal    Past Surgical History:  Procedure Laterality Date   CESAREAN SECTION  06/29/2009   X 1 [facility] [place]   CESAREAN SECTION N/A 05/22/2014   Procedure: Repeat CESAREAN SECTION;  Surgeon: Charlie JINNY Flowers, MD;  Location: [facility];  Service: Obstetrics;  Laterality: N/A;  EDD: 05/31/14   CESAREAN SECTION WITH BILATERAL TUBAL LIGATION Bilateral 03/27/2016   Procedure: Repeat CESAREAN SECTION WITH BILATERAL TUBAL LIGATION;  Surgeon: Charlie Flowers, MD;  Location: [facility];  Service: Obstetrics;  Laterality: Bilateral;  EDD: 04/01/16   CHOLECYSTECTOMY N/A 04/29/2017   Procedure: LAPAROSCOPIC CHOLECYSTECTOMY WITH INTRAOPERATIVE CHOLANGIOGRAM;  Surgeon: Ethyl Lenis, MD;  Location: [facility];  Service: General;  Laterality: N/A;  ERAS PATHWAY   COLONOSCOPY WITH PROPOFOL  N/A 09/04/2022   Procedure: COLONOSCOPY WITH PROPOFOL ;  Surgeon: Unk Corinn Skiff, MD;  Location: [facility];  Service:  Gastroenterology;  Laterality: N/A;   DILATION AND CURETTAGE OF UTERUS   2005, 2010     x2 MAB   ESOPHAGOGASTRODUODENOSCOPY N/A 01/11/2017   Procedure: ESOPHAGOGASTRODUODENOSCOPY (EGD);  Surgeon: Saintclair Jasper, MD;  Location: [facility];  Service: Gastroenterology;  Laterality: N/A;   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   IR GENERIC HISTORICAL  03/26/2016   IR FLUORO GUIDE CV LINE RIGHT 03/26/2016 [facility]   IR GENERIC HISTORICAL  03/26/2016   IR US  GUIDE VASC ACCESS RIGHT 03/26/2016 [facility]   WISDOM TOOTH EXTRACTION     Family History  Problem Relation Age of Onset   Depression Mother    Skin cancer Mother    HIV/AIDS Father    Skin cancer Sister    Depression Paternal Grandmother    Breast cancer Neg Hx    Social History   Socioeconomic History   Marital status: Single    Spouse name: Not on file   Number of children: Not on file   Years of education:  Not on file   Highest education level: Some college, no degree  Occupational History   Not on file  Tobacco Use   Smoking status: Former    Current packs/day: 0.00    Average packs/day: 1 pack/day for 18.0 years (18.0 ttl pk-yrs)    Types: Cigarettes    Start date: 04/30/1991    Quit date: 04/29/2009    Years since quitting: 14.2   Smokeless tobacco: Never  Vaping Use   Vaping status: Never Used  Substance and Sexual Activity   Alcohol use: Yes    Comment: OCCASIONALLY   Drug use: No   Sexual activity: Yes    Birth control/protection: Surgical    Comment: BTL  Other Topics Concern   Not on file  Social History Narrative   Right handed   One story home   4 children   Drinks caffeine 1-2 cups a day   Social Drivers of Health   Financial Resource Strain: Low Risk  (08/06/2023)   Overall Financial Resource Strain (CARDIA)    Difficulty of Paying Living Expenses: Not very hard  Food Insecurity: No Food Insecurity (08/06/2023)   Hunger Vital Sign    Worried About Running Out of Food in the Last Year: Never true    Ran  Out of Food in the Last Year: Never true  Transportation Needs: No Transportation Needs (08/06/2023)   PRAPARE - Administrator, Civil Service (Medical): No    Lack of Transportation (Non-Medical): No  Physical Activity: Insufficiently Active (08/06/2023)   Exercise Vital Sign    Days of Exercise per Week: 2 days    Minutes of Exercise per Session: 20 min  Stress: Stress Concern Present (08/06/2023)   [employer] of Occupational Health - Occupational Stress Questionnaire    Feeling of Stress : To some extent  Social Connections: Moderately Integrated (08/06/2023)   Social Connection and Isolation Panel [NHANES]    Frequency of Communication with Friends and Family: Three times a week    Frequency of Social Gatherings with Friends and Family: More than three times a week    Attends Religious Services: 1 to 4 times per year    Active Member of [employer] or Organizations: Yes    Attends Banker Meetings: 1 to 4 times per year    Marital Status: Divorced    Tobacco Counseling Counseling given: Not Answered   Clinical Intake:  Pre-visit preparation completed: Yes  Pain : 0-10 Pain Score: 3  Pain Type: Chronic pain Pain Location: Back Pain Orientation: Lower Pain Descriptors / Indicators: Aching, Discomfort, Constant Pain Onset: More than a month ago Pain Frequency: Constant Pain Relieving Factors: percocet  Pain Relieving Factors: percocet  BMI - recorded: 39 Nutritional Status: BMI > 30  Obese Nutritional Risks: None Diabetes: No  How often do you need to have someone help you when you read instructions, pamphlets, or other written materials from your doctor or pharmacy?: 1 - Never  Interpreter Needed?: No  Information entered by :: JHONNIE DAS, LPN   Activities of Daily Living    08/06/2023    9:04 AM 08/02/2023    8:37 AM  In your present state of health, do you have any difficulty performing the following activities:  Hearing? 0 0  Vision? 0  0  Difficulty concentrating or making decisions? 1 1  Walking or climbing stairs? 0 0  Dressing or bathing? 0 0  Doing errands, shopping? 0 0  Preparing Food and eating ?  N N  Using the Toilet? N N  In the past six months, have you accidently leaked urine? Y Y  Do you have problems with loss of bowel control? N N  Managing your Medications? N N  Managing your Finances? Y Y  Housekeeping or managing your Housekeeping? CINDERELLA CINDERELLA    Patient Care Team: Edman Marsa PARAS, DO as PCP - General (Family Medicine) Skeet Juliene SAUNDERS, DO as Consulting Physician (Neurology) Mevelyn JONETTA Bathe, OD (Optometry)  Indicate any recent Medical Services you may have received from other than [facility] providers in the past year (date may be approximate).     Assessment:   This is a routine wellness examination for Suzanne Wilcox.  Hearing/Vision screen Hearing Screening - Comments:: NO AIDS Vision Screening - Comments:: WEARS GLASSES ALL THE TIME- [facility]   Goals Addressed             This Visit's Progress    DIET - INCREASE WATER INTAKE         Depression Screen    08/06/2023    8:59 AM 06/04/2023   10:58 AM 05/11/2023    9:23 AM 05/03/2023    9:23 AM 04/28/2023    9:19 AM 04/02/2023    9:06 AM 01/04/2023   10:24 AM  PHQ 2/9 Scores  PHQ - 2 Score 0 2 5 4 1 1 2   PHQ- 9 Score 0 12 17 16  13 13     Fall Risk    08/06/2023    9:04 AM 08/02/2023    8:37 AM 05/11/2023    9:23 AM 05/03/2023    9:24 AM 04/28/2023    9:19 AM  Fall Risk   Falls in the past year? 0 1 0 0 0  Number falls in past yr: 0 0  0 0  Injury with Fall? 0 0  0 0  Risk for fall due to : No Fall Risks    No Fall Risks  Follow up Falls prevention discussed;Falls evaluation completed    Falls prevention discussed    MEDICARE RISK AT HOME: Medicare Risk at Home Any stairs in or around the home?: Yes If so, are there any without handrails?: No Home free of loose throw rugs in walkways, pet beds, electrical cords, etc?: Yes Adequate  lighting in your home to reduce risk of falls?: Yes Life alert?: No Use of a cane, walker or w/c?: No Grab bars in the bathroom?: No Shower chair or bench in shower?: Yes Elevated toilet seat or a handicapped toilet?: Yes  TIMED UP AND GO:  Was the test performed?  No    Cognitive Function:        08/06/2023    9:05 AM 07/31/2022    8:54 AM  6CIT Screen  What Year? 0 points 0 points  What month? 0 points 0 points  What time? 0 points 0 points  Count back from 20 0 points 0 points  Months in reverse 0 points 0 points  Repeat phrase 0 points 0 points  Total Score 0 points 0 points    Immunizations Immunization History  Administered Date(s) Administered   Influenza, Seasonal, Injecte, Preservative Fre 03/17/2016   Influenza,inj,Quad PF,6+ Mos 06/14/2017, 05/02/2022   Influenza-Unspecified 04/17/2014, 04/21/2019   Tdap 04/17/2014, 01/15/2016    TDAP status: Up to date  Flu Vaccine status: Declined, Education has been provided regarding the importance of this vaccine but patient still declined. Advised may receive this vaccine at local pharmacy or Health Dept. Aware  to provide a copy of the vaccination record if obtained from local pharmacy or Health Dept. Verbalized acceptance and understanding.  Pneumococcal vaccine status: Declined,  Education has been provided regarding the importance of this vaccine but patient still declined. Advised may receive this vaccine at local pharmacy or Health Dept. Aware to provide a copy of the vaccination record if obtained from local pharmacy or Health Dept. Verbalized acceptance and understanding.   Covid-19 vaccine status: Declined, Education has been provided regarding the importance of this vaccine but patient still declined. Advised may receive this vaccine at local pharmacy or Health Dept.or vaccine clinic. Aware to provide a copy of the vaccination record if obtained from local pharmacy or Health Dept. Verbalized acceptance and  understanding.  Qualifies for Shingles Vaccine? No   Zostavax completed Yes   Shingrix Completed?: No.    Education has been provided regarding the importance of this vaccine. Patient has been advised to call insurance company to determine out of pocket expense if they have not yet received this vaccine. Advised may also receive vaccine at local pharmacy or Health Dept. Verbalized acceptance and understanding.  Screening Tests Health Maintenance  Topic Date Due   COVID-19 Vaccine (1) Never done   Hepatitis C Screening  Never done   Cervical Cancer Screening (HPV/Pap Cotest)  Never done   INFLUENZA VACCINE  09/27/2023 (Originally 01/28/2023)   Medicare Annual Wellness (AWV)  08/05/2024   DTaP/Tdap/Td (3 - Td or Tdap) 01/14/2026   Colonoscopy  09/03/2032   HIV Screening  Completed   HPV VACCINES  Aged Out   Fecal DNA (Cologuard)  Discontinued    Health Maintenance  Health Maintenance Due  Topic Date Due   COVID-19 Vaccine (1) Never done   Hepatitis C Screening  Never done   Cervical Cancer Screening (HPV/Pap Cotest)  Never done    Colorectal cancer screening: Type of screening: Colonoscopy. Completed 09/04/22. Repeat every 10 years  Mammogram status: Completed 07/13/23. Repeat every year   Lung Cancer Screening: (Low Dose CT Chest recommended if Age 19-80 years, 20 pack-year currently smoking OR have quit w/in 15years.) does not qualify.    Additional Screening:  Hepatitis C Screening: does qualify; Completed NO  Vision Screening: Recommended annual ophthalmology exams for early detection of glaucoma and other disorders of the eye. Is the patient up to date with their annual eye exam?  Yes  Who is the provider or what is the name of the office in which the patient attends annual eye exams? [facility] If pt is not established with a provider, would they like to be referred to a provider to establish care? No .   Dental Screening: Recommended annual dental exams for proper oral  hygiene    Community Resource Referral / Chronic Care Management: CRR required this visit?  No   CCM required this visit?  No     Plan:     I have personally reviewed and noted the following in the patient's chart:   Medical and social history Use of alcohol, tobacco or illicit drugs  Current medications and supplements including opioid prescriptions. Patient is currently taking opioid prescriptions. Information provided to patient regarding non-opioid alternatives. Patient advised to discuss non-opioid treatment plan with their provider. Functional ability and status Nutritional status Physical activity Advanced directives List of other physicians Hospitalizations, surgeries, and ER visits in previous 12 months Vitals Screenings to include cognitive, depression, and falls Referrals and appointments  In addition, I have reviewed and discussed with patient certain  preventive protocols, quality metrics, and best practice recommendations. A written personalized care plan for preventive services as well as general preventive health recommendations were provided to patient.     Jhonnie GORMAN Das, LPN   12/28/7972   After Visit Summary: (MyChart) Due to this being a telephonic visit, the after visit summary with patients personalized plan was offered to patient via MyChart   Nurse Notes: NONE

## 2023-08-09 ENCOUNTER — Other Ambulatory Visit: Payer: Self-pay

## 2023-08-09 ENCOUNTER — Encounter
Admission: RE | Admit: 2023-08-09 | Discharge: 2023-08-09 | Disposition: A | Discharge status: Home / Self Care | Payer: 59 | Source: Ambulatory Visit | Attending: Obstetrics and Gynecology | Admitting: Obstetrics and Gynecology

## 2023-08-09 DIAGNOSIS — Z01812 Encounter for preprocedural laboratory examination: Secondary | ICD-10-CM

## 2023-08-09 DIAGNOSIS — D539 Nutritional anemia, unspecified: Secondary | ICD-10-CM

## 2023-08-09 DIAGNOSIS — I10 Essential (primary) hypertension: Secondary | ICD-10-CM

## 2023-08-09 HISTORY — DX: Anemia, unspecified: D64.9

## 2023-08-09 HISTORY — DX: Family history of other specified conditions: Z84.89

## 2023-08-09 HISTORY — DX: Pneumonia, unspecified organism: J18.9

## 2023-08-09 HISTORY — DX: Lymphedema, not elsewhere classified: I89.0

## 2023-08-09 HISTORY — DX: Unspecified osteoarthritis, unspecified site: M19.90

## 2023-08-09 HISTORY — DX: Personal history of other diseases of the digestive system: Z87.19

## 2023-08-09 HISTORY — DX: Other complications of anesthesia, initial encounter: T88.59XA

## 2023-08-09 NOTE — Patient Instructions (Addendum)
 Your procedure is scheduled on: Report to the Registration Desk on the 1st floor of the [facility]. To find out your arrival time, please call [phone] between 1PM - 3PM on: If your arrival time is 6:00 am, do not arrive before that time as the [facility] entrance doors do not open until 6:00 am.  REMEMBER: Instructions that are not followed completely may result in serious medical risk, up to and including death; or upon the discretion of your surgeon and anesthesiologist your surgery may need to be rescheduled.  Do not eat food after midnight the night before surgery.  No gum chewing or hard candies.  You may however, drink CLEAR liquids up to 2 hours before you are scheduled to arrive for your surgery. Do not drink anything within 2 hours of your scheduled arrival time.  Clear liquids include: - water  - apple juice without pulp - gatorade (not RED colors) - black coffee or tea (Do NOT add milk or creamers to the coffee or tea) Do NOT drink anything that is not on this list.   In addition, your doctor has ordered for you to drink the provided:  Ensure Pre-Surgery Clear Carbohydrate Drink  Drinking this carbohydrate drink up to two hours before surgery helps to reduce insulin resistance and improve patient outcomes. Please complete drinking 2 hours before scheduled arrival time.  One week prior to surgery: Stop Anti-inflammatories (NSAIDS) such as Advil , Aleve, Ibuprofen , Motrin , Naproxen, Naprosyn and Aspirin based products such as Excedrin, Goody's Powder, BC Powder.  Stop ANY OVER THE COUNTER supplements until after surgery.  You may continue to take Tylenol  if needed for pain up until the day of surgery.   ON THE DAY OF SURGERY ONLY TAKE THESE MEDICATIONS WITH SIPS OF WATER:  pantoprazole  (PROTONIX )  amLODipine  (NORVASC )  ADDERALL if needed buPROPion  (WELLBUTRIN )     No Alcohol for 24 hours before or after surgery.  No Smoking including e-cigarettes for 24  hours before surgery.  No chewable tobacco products for at least 6 hours before surgery.  No nicotine patches on the day of surgery.  Do not use any "recreational" drugs for at least a week (preferably 2 weeks) before your surgery.  Please be advised that the combination of cocaine and anesthesia may have negative outcomes, up to and including death. If you test positive for cocaine, your surgery will be cancelled.  On the morning of surgery brush your teeth with toothpaste and water, you may rinse your mouth with mouthwash if you wish. Do not swallow any toothpaste or mouthwash.  Use CHG Soap or wipes as directed on instruction sheet.  Do not wear jewelry, make-up, hairpins, clips or nail polish.  For welded (permanent) jewelry: bracelets, anklets, waist bands, etc.  Please have this removed prior to surgery.  If it is not removed, there is a chance that hospital personnel will need to cut it off on the day of surgery.  Do not wear lotions, powders, or perfumes.   Do not shave body hair from the neck down 48 hours before surgery.  Contact lenses, hearing aids and dentures may not be worn into surgery.  Do not bring valuables to the hospital. [facility] is not responsible for any missing/lost belongings or valuables.   Notify your doctor if there is any change in your medical condition (cold, fever, infection).  Wear comfortable clothing (specific to your surgery type) to the hospital.  After surgery, you can help prevent lung complications by doing  breathing exercises.  Take deep breaths and cough every 1-2 hours. Your doctor may order a device called an Incentive Spirometer to help you take deep breaths. When coughing or sneezing, hold a pillow firmly against your incision with both hands. This is called "splinting." Doing this helps protect your incision. It also decreases belly discomfort.  If you are being admitted to the hospital overnight, leave your suitcase in the car.  After surgery it may be brought to your room.  In case of increased patient census, it may be necessary for you, the patient, to continue your postoperative care in the Same Day Surgery department.  If you are being discharged the day of surgery, you will not be allowed to drive home. You will need a responsible individual to drive you home and stay with you for 24 hours after surgery.   If you are taking public transportation, you will need to have a responsible individual with you.  Please call the Pre-admissions Testing Dept. at [phone] if you have any questions about these instructions.  Surgery Visitation Policy:  Patients having surgery or a procedure may have two visitors.  Children under the age of 64 must have an adult with them who is not the patient.  Temporary Visitor Restrictions Due to increasing cases of flu, RSV and COVID-19: Children ages 18 and under will not be able to visit patients in [facility] hospitals under most circumstances.  Inpatient Visitation:    Visiting hours are 7 a.m. to 8 p.m. Up to four visitors are allowed at one time in a patient room. The visitors may rotate out with other people during the day.  One visitor age 60 or older may stay with the patient overnight and must be in the room by 8 p.m.     Preparing for Surgery with CHLORHEXIDINE  GLUCONATE (CHG) Soap  Chlorhexidine  Gluconate (CHG) Soap  o An antiseptic cleaner that kills germs and bonds with the skin to continue killing germs even after washing  o Used for showering the night before surgery and morning of surgery  Before surgery, you can play an important role by reducing the number of germs on your skin.  CHG (Chlorhexidine  gluconate) soap is an antiseptic cleanser which kills germs and bonds with the skin to continue killing germs even after washing.  Please do not use if you have an allergy to CHG or antibacterial soaps. If your skin becomes reddened/irritated stop  using the CHG.  1. Shower the NIGHT BEFORE SURGERY and the MORNING OF SURGERY with CHG soap.  2. If you choose to wash your hair, wash your hair first as usual with your normal shampoo.  3. After shampooing, rinse your hair and body thoroughly to remove the shampoo.  4. Use CHG as you would any other liquid soap. You can apply CHG directly to the skin and wash gently with a scrungie or a clean washcloth.  5. Apply the CHG soap to your body only from the neck down. Do not use on open wounds or open sores. Avoid contact with your eyes, ears, mouth, and genitals (private parts). Wash face and genitals (private parts) with your normal soap.  6. Wash thoroughly, paying special attention to the area where your surgery will be performed.  7. Thoroughly rinse your body with warm water.  8. Do not shower/wash with your normal soap after using and rinsing off the CHG soap.  9. Pat yourself dry with a clean towel.  10. Wear clean pajamas  to bed the night before surgery.  12. Place clean sheets on your bed the night of your first shower and do not sleep with pets.  13. Shower again with the CHG soap on the day of surgery prior to arriving at the hospital.  14. Do not apply any deodorants/lotions/powders.  15. Please wear clean clothes to the hospital.

## 2023-08-10 ENCOUNTER — Other Ambulatory Visit: Payer: Self-pay | Admitting: Family Medicine

## 2023-08-10 ENCOUNTER — Encounter
Admission: RE | Admit: 2023-08-10 | Discharge: 2023-08-10 | Discharge status: Home / Self Care | Payer: 59 | Source: Ambulatory Visit | Attending: Obstetrics and Gynecology | Admitting: Obstetrics and Gynecology

## 2023-08-10 DIAGNOSIS — N838 Other noninflammatory disorders of ovary, fallopian tube and broad ligament: Secondary | ICD-10-CM | POA: Diagnosis not present

## 2023-08-10 DIAGNOSIS — N8302 Follicular cyst of left ovary: Secondary | ICD-10-CM | POA: Diagnosis not present

## 2023-08-10 DIAGNOSIS — F319 Bipolar disorder, unspecified: Secondary | ICD-10-CM | POA: Diagnosis not present

## 2023-08-10 DIAGNOSIS — G4733 Obstructive sleep apnea (adult) (pediatric): Secondary | ICD-10-CM | POA: Diagnosis not present

## 2023-08-10 DIAGNOSIS — F419 Anxiety disorder, unspecified: Secondary | ICD-10-CM | POA: Diagnosis not present

## 2023-08-10 DIAGNOSIS — D539 Nutritional anemia, unspecified: Secondary | ICD-10-CM | POA: Diagnosis not present

## 2023-08-10 DIAGNOSIS — Z9884 Bariatric surgery status: Secondary | ICD-10-CM | POA: Diagnosis not present

## 2023-08-10 DIAGNOSIS — N8003 Adenomyosis of the uterus: Secondary | ICD-10-CM | POA: Diagnosis not present

## 2023-08-10 DIAGNOSIS — I10 Essential (primary) hypertension: Secondary | ICD-10-CM | POA: Diagnosis not present

## 2023-08-10 DIAGNOSIS — Z87891 Personal history of nicotine dependence: Secondary | ICD-10-CM | POA: Diagnosis not present

## 2023-08-10 DIAGNOSIS — Z1501 Genetic susceptibility to malignant neoplasm of breast: Secondary | ICD-10-CM | POA: Insufficient documentation

## 2023-08-10 DIAGNOSIS — D270 Benign neoplasm of right ovary: Secondary | ICD-10-CM | POA: Diagnosis not present

## 2023-08-10 DIAGNOSIS — N8301 Follicular cyst of right ovary: Secondary | ICD-10-CM | POA: Diagnosis not present

## 2023-08-10 DIAGNOSIS — Z01810 Encounter for preprocedural cardiovascular examination: Secondary | ICD-10-CM | POA: Diagnosis not present

## 2023-08-10 DIAGNOSIS — Z79899 Other long term (current) drug therapy: Secondary | ICD-10-CM | POA: Diagnosis not present

## 2023-08-10 DIAGNOSIS — Z01818 Encounter for other preprocedural examination: Secondary | ICD-10-CM | POA: Insufficient documentation

## 2023-08-10 DIAGNOSIS — K219 Gastro-esophageal reflux disease without esophagitis: Secondary | ICD-10-CM | POA: Diagnosis not present

## 2023-08-10 DIAGNOSIS — N888 Other specified noninflammatory disorders of cervix uteri: Secondary | ICD-10-CM | POA: Diagnosis not present

## 2023-08-10 DIAGNOSIS — Z1509 Genetic susceptibility to other malignant neoplasm: Secondary | ICD-10-CM | POA: Insufficient documentation

## 2023-08-10 DIAGNOSIS — N939 Abnormal uterine and vaginal bleeding, unspecified: Secondary | ICD-10-CM | POA: Diagnosis present

## 2023-08-10 LAB — CBC
HCT: 38.6 % (ref 36.0–46.0)
Hemoglobin: 12.5 g/dL (ref 12.0–15.0)
MCH: 30 pg (ref 26.0–34.0)
MCHC: 32.4 g/dL (ref 30.0–36.0)
MCV: 92.6 fL (ref 80.0–100.0)
Platelets: 242 10*3/uL (ref 150–400)
RBC: 4.17 MIL/uL (ref 3.87–5.11)
RDW: 14.7 % (ref 11.5–15.5)
WBC: 5.7 10*3/uL (ref 4.0–10.5)
nRBC: 0 % (ref 0.0–0.2)

## 2023-08-10 LAB — TYPE AND SCREEN
ABO/RH(D): O POS
Antibody Screen: NEGATIVE

## 2023-08-10 NOTE — Telephone Encounter (Signed)
Requested Prescriptions  Pending Prescriptions Disp Refills   amLODipine (NORVASC) 5 MG tablet [Pharmacy Med Name: AMLODIPINE BESYLATE 5MG  TABLETS] 90 tablet 1    Sig: TAKE 1 TABLET(5 MG) BY MOUTH DAILY     Cardiovascular: Calcium Channel Blockers 2 Failed - 08/10/2023  5:18 PM      Failed - Last BP in normal range    BP Readings from Last 1 Encounters:  08/06/23 (!) 141/85         Passed - Last Heart Rate in normal range    Pulse Readings from Last 1 Encounters:  08/06/23 82         Passed - Valid encounter within last 6 months    Recent Outpatient Visits           2 weeks ago Annual physical exam   [facility] [facility] Smitty Cords, DO   2 months ago Viral URI with cough   [facility] [facility] [place], Salvadore Oxford, NP   3 months ago Essential hypertension   [place] [facility] Smitty Cords, DO   3 months ago Acute non-recurrent frontal sinusitis   [place] [facility] Smitty Cords, DO   3 months ago Right ear pain   [facility] Danelle Berry, [place]

## 2023-08-11 ENCOUNTER — Ambulatory Visit: Payer: 59

## 2023-08-11 ENCOUNTER — Ambulatory Visit
Admission: RE | Admit: 2023-08-11 | Discharge: 2023-08-11 | Disposition: A | Discharge status: Home / Self Care | Payer: 59 | Attending: Obstetrics and Gynecology | Admitting: Obstetrics and Gynecology

## 2023-08-11 ENCOUNTER — Ambulatory Visit: Payer: 59 | Admitting: Urgent Care

## 2023-08-11 ENCOUNTER — Other Ambulatory Visit: Payer: Self-pay

## 2023-08-11 ENCOUNTER — Encounter
Admission: RE | Disposition: A | Discharge status: Home / Self Care | Payer: Self-pay | Source: Home / Self Care | Attending: Obstetrics and Gynecology

## 2023-08-11 ENCOUNTER — Other Ambulatory Visit: Payer: Self-pay | Admitting: Medical Genetics

## 2023-08-11 ENCOUNTER — Encounter: Payer: Self-pay | Admitting: Obstetrics and Gynecology

## 2023-08-11 DIAGNOSIS — G4733 Obstructive sleep apnea (adult) (pediatric): Secondary | ICD-10-CM | POA: Insufficient documentation

## 2023-08-11 DIAGNOSIS — Z79899 Other long term (current) drug therapy: Secondary | ICD-10-CM | POA: Insufficient documentation

## 2023-08-11 DIAGNOSIS — K219 Gastro-esophageal reflux disease without esophagitis: Secondary | ICD-10-CM | POA: Insufficient documentation

## 2023-08-11 DIAGNOSIS — D271 Benign neoplasm of left ovary: Secondary | ICD-10-CM | POA: Diagnosis not present

## 2023-08-11 DIAGNOSIS — N926 Irregular menstruation, unspecified: Secondary | ICD-10-CM

## 2023-08-11 DIAGNOSIS — F319 Bipolar disorder, unspecified: Secondary | ICD-10-CM | POA: Insufficient documentation

## 2023-08-11 DIAGNOSIS — Z87891 Personal history of nicotine dependence: Secondary | ICD-10-CM | POA: Diagnosis not present

## 2023-08-11 DIAGNOSIS — I10 Essential (primary) hypertension: Secondary | ICD-10-CM | POA: Insufficient documentation

## 2023-08-11 DIAGNOSIS — N888 Other specified noninflammatory disorders of cervix uteri: Secondary | ICD-10-CM | POA: Diagnosis not present

## 2023-08-11 DIAGNOSIS — D270 Benign neoplasm of right ovary: Secondary | ICD-10-CM | POA: Insufficient documentation

## 2023-08-11 DIAGNOSIS — M5020 Other cervical disc displacement, unspecified cervical region: Secondary | ICD-10-CM

## 2023-08-11 DIAGNOSIS — N838 Other noninflammatory disorders of ovary, fallopian tube and broad ligament: Secondary | ICD-10-CM | POA: Insufficient documentation

## 2023-08-11 DIAGNOSIS — Z9884 Bariatric surgery status: Secondary | ICD-10-CM | POA: Insufficient documentation

## 2023-08-11 DIAGNOSIS — N939 Abnormal uterine and vaginal bleeding, unspecified: Secondary | ICD-10-CM | POA: Diagnosis not present

## 2023-08-11 DIAGNOSIS — F419 Anxiety disorder, unspecified: Secondary | ICD-10-CM | POA: Insufficient documentation

## 2023-08-11 DIAGNOSIS — Z01812 Encounter for preprocedural laboratory examination: Secondary | ICD-10-CM

## 2023-08-11 DIAGNOSIS — N8302 Follicular cyst of left ovary: Secondary | ICD-10-CM | POA: Insufficient documentation

## 2023-08-11 DIAGNOSIS — N8301 Follicular cyst of right ovary: Secondary | ICD-10-CM | POA: Insufficient documentation

## 2023-08-11 DIAGNOSIS — Z1501 Genetic susceptibility to malignant neoplasm of breast: Secondary | ICD-10-CM | POA: Insufficient documentation

## 2023-08-11 DIAGNOSIS — N8003 Adenomyosis of the uterus: Secondary | ICD-10-CM | POA: Insufficient documentation

## 2023-08-11 HISTORY — PX: TOTAL LAPAROSCOPIC HYSTERECTOMY WITH BILATERAL SALPINGO OOPHORECTOMY: SHX6845

## 2023-08-11 LAB — POCT PREGNANCY, URINE: Preg Test, Ur: NEGATIVE

## 2023-08-11 SURGERY — HYSTERECTOMY, TOTAL, LAPAROSCOPIC, WITH BILATERAL SALPINGO-OOPHORECTOMY
Anesthesia: General

## 2023-08-11 MED ORDER — POVIDONE-IODINE 10 % EX SWAB
2.0000 | Freq: Once | CUTANEOUS | Status: AC
Start: 2023-08-11 — End: 2023-08-11
  Administered 2023-08-11: 2 via TOPICAL

## 2023-08-11 MED ORDER — LIDOCAINE HCL (CARDIAC) PF 100 MG/5ML IV SOSY
PREFILLED_SYRINGE | INTRAVENOUS | Status: DC | PRN
  Administered 2023-08-11: 100 mg via INTRAVENOUS

## 2023-08-11 MED ORDER — PROPOFOL 10 MG/ML IV BOLUS
INTRAVENOUS | Status: DC | PRN
  Administered 2023-08-11: 150 ug/kg/min via INTRAVENOUS
  Administered 2023-08-11: 200 mg via INTRAVENOUS
  Administered 2023-08-11: 50 mg via INTRAVENOUS

## 2023-08-11 MED ORDER — ORAL CARE MOUTH RINSE: 15.0000 mL | Freq: Once | OROMUCOSAL | Status: AC

## 2023-08-11 MED ORDER — DEXMEDETOMIDINE HCL IN NACL 80 MCG/20ML IV SOLN
INTRAVENOUS | Status: DC | PRN
  Administered 2023-08-11: 8 ug via INTRAVENOUS
  Administered 2023-08-11: 12 ug via INTRAVENOUS

## 2023-08-11 MED ORDER — CHLORHEXIDINE GLUCONATE 0.12 % MT SOLN
15.0000 mL | Freq: Once | OROMUCOSAL | Status: AC
  Administered 2023-08-11: 15 mL via OROMUCOSAL

## 2023-08-11 MED ORDER — ESTRADIOL 0.05 MG/24HR TD PTWK: 0.0500 mg | MEDICATED_PATCH | TRANSDERMAL | 3 refills | Status: DC

## 2023-08-11 MED ORDER — FENTANYL CITRATE (PF) 100 MCG/2ML IJ SOLN
INTRAMUSCULAR | Status: AC
  Filled 2023-08-11: qty 2

## 2023-08-11 MED ORDER — LACTATED RINGERS IV SOLN: INTRAVENOUS | Status: DC

## 2023-08-11 MED ORDER — PROPOFOL 1000 MG/100ML IV EMUL
INTRAVENOUS | Status: AC
  Filled 2023-08-11: qty 100

## 2023-08-11 MED ORDER — FENTANYL CITRATE (PF) 100 MCG/2ML IJ SOLN
25.0000 ug | INTRAMUSCULAR | Status: DC | PRN
  Administered 2023-08-11 (×2): 50 ug via INTRAVENOUS

## 2023-08-11 MED ORDER — DEXAMETHASONE SODIUM PHOSPHATE 10 MG/ML IJ SOLN
INTRAMUSCULAR | Status: DC | PRN
  Administered 2023-08-11: 10 mg via INTRAVENOUS

## 2023-08-11 MED ORDER — SUCCINYLCHOLINE CHLORIDE 200 MG/10ML IV SOSY
PREFILLED_SYRINGE | INTRAVENOUS | Status: DC | PRN
  Administered 2023-08-11: 160 mg via INTRAVENOUS

## 2023-08-11 MED ORDER — OXYCODONE HCL 5 MG PO TABS
5.0000 mg | ORAL_TABLET | Freq: Once | ORAL | Status: AC | PRN
  Administered 2023-08-11: 5 mg via ORAL

## 2023-08-11 MED ORDER — OXYCODONE HCL 5 MG/5ML PO SOLN: 5.0000 mg | Freq: Once | ORAL | Status: AC | PRN

## 2023-08-11 MED ORDER — CHLORHEXIDINE GLUCONATE 0.12 % MT SOLN
OROMUCOSAL | Status: AC
Start: 2023-08-11 — End: ?
  Filled 2023-08-11: qty 15

## 2023-08-11 MED ORDER — CEFAZOLIN SODIUM-DEXTROSE 2-4 GM/100ML-% IV SOLN
INTRAVENOUS | Status: AC
  Filled 2023-08-11: qty 100

## 2023-08-11 MED ORDER — ROCURONIUM BROMIDE 100 MG/10ML IV SOLN
INTRAVENOUS | Status: DC | PRN
Start: 2023-08-11 — End: 2023-08-11
  Administered 2023-08-11 (×2): 50 mg via INTRAVENOUS
  Administered 2023-08-11: 20 mg via INTRAVENOUS
  Administered 2023-08-11: 30 mg via INTRAVENOUS
  Administered 2023-08-11: 20 mg via INTRAVENOUS

## 2023-08-11 MED ORDER — HEPARIN SODIUM (PORCINE) 5000 UNIT/ML IJ SOLN
5000.0000 [IU] | INTRAMUSCULAR | Status: AC
Start: 2023-08-11 — End: 2023-08-11
  Administered 2023-08-11: 5000 [IU] via SUBCUTANEOUS

## 2023-08-11 MED ORDER — HYDROMORPHONE HCL 1 MG/ML IJ SOLN
INTRAMUSCULAR | Status: AC
  Filled 2023-08-11: qty 1

## 2023-08-11 MED ORDER — HYDROMORPHONE HCL 1 MG/ML IJ SOLN
1.0000 mg | INTRAMUSCULAR | Status: DC | PRN
  Administered 2023-08-11: 1 mg via INTRAVENOUS

## 2023-08-11 MED ORDER — PROPOFOL 10 MG/ML IV BOLUS
INTRAVENOUS | Status: AC
  Filled 2023-08-11: qty 20

## 2023-08-11 MED ORDER — SILVER NITRATE-POT NITRATE 75-25 % EX MISC
CUTANEOUS | Status: AC
  Filled 2023-08-11: qty 10

## 2023-08-11 MED ORDER — OXYCODONE-ACETAMINOPHEN 5-325 MG PO TABS: 1.0000 | ORAL_TABLET | Freq: Four times a day (QID) | ORAL | 0 refills | Status: DC | PRN

## 2023-08-11 MED ORDER — IBUPROFEN 600 MG PO TABS: 600.0000 mg | ORAL_TABLET | Freq: Four times a day (QID) | ORAL | 0 refills | Status: DC | PRN

## 2023-08-11 MED ORDER — FENTANYL CITRATE (PF) 100 MCG/2ML IJ SOLN
INTRAMUSCULAR | Status: DC | PRN
  Administered 2023-08-11 (×2): 50 ug via INTRAVENOUS

## 2023-08-11 MED ORDER — KETOROLAC TROMETHAMINE 30 MG/ML IJ SOLN
INTRAMUSCULAR | Status: AC
  Filled 2023-08-11: qty 1

## 2023-08-11 MED ORDER — BUPIVACAINE HCL (PF) 0.5 % IJ SOLN
INTRAMUSCULAR | Status: AC
  Filled 2023-08-11: qty 30

## 2023-08-11 MED ORDER — DOCUSATE SODIUM 100 MG PO CAPS: 100.0000 mg | ORAL_CAPSULE | Freq: Two times a day (BID) | ORAL | 2 refills | Status: AC | PRN

## 2023-08-11 MED ORDER — PHENYLEPHRINE 80 MCG/ML (10ML) SYRINGE FOR IV PUSH (FOR BLOOD PRESSURE SUPPORT)
PREFILLED_SYRINGE | INTRAVENOUS | Status: DC | PRN
  Administered 2023-08-11: 160 ug via INTRAVENOUS
  Administered 2023-08-11: 80 ug via INTRAVENOUS

## 2023-08-11 MED ORDER — CEFAZOLIN SODIUM-DEXTROSE 2-4 GM/100ML-% IV SOLN
2.0000 g | INTRAVENOUS | Status: AC
Start: 2023-08-11 — End: 2023-08-11
  Administered 2023-08-11: 2 g via INTRAVENOUS

## 2023-08-11 MED ORDER — ROCURONIUM BROMIDE 10 MG/ML (PF) SYRINGE
PREFILLED_SYRINGE | INTRAVENOUS | Status: AC
  Filled 2023-08-11: qty 10

## 2023-08-11 MED ORDER — MIDAZOLAM HCL 2 MG/2ML IJ SOLN
INTRAMUSCULAR | Status: DC | PRN
  Administered 2023-08-11: 2 mg via INTRAVENOUS

## 2023-08-11 MED ORDER — OXYCODONE HCL 5 MG PO TABS
ORAL_TABLET | ORAL | Status: AC
  Filled 2023-08-11: qty 1

## 2023-08-11 MED ORDER — ONDANSETRON HCL 4 MG/2ML IJ SOLN
INTRAMUSCULAR | Status: DC | PRN
  Administered 2023-08-11: 4 mg via INTRAVENOUS

## 2023-08-11 MED ORDER — ACETAMINOPHEN 10 MG/ML IV SOLN
INTRAVENOUS | Status: AC
  Filled 2023-08-11: qty 100

## 2023-08-11 MED ORDER — BUPIVACAINE HCL (PF) 0.5 % IJ SOLN
INTRAMUSCULAR | Status: DC | PRN
  Administered 2023-08-11: 11 mL

## 2023-08-11 MED ORDER — MIDAZOLAM HCL 2 MG/2ML IJ SOLN
INTRAMUSCULAR | Status: AC
  Filled 2023-08-11: qty 2

## 2023-08-11 MED ORDER — SURGIFLO WITH THROMBIN (HEMOSTATIC MATRIX KIT) OPTIME
TOPICAL | Status: DC | PRN
Start: 2023-08-11 — End: 2023-08-11
  Administered 2023-08-11: 1 via TOPICAL

## 2023-08-11 MED ORDER — HYDROMORPHONE HCL 1 MG/ML IJ SOLN
INTRAMUSCULAR | Status: DC | PRN
  Administered 2023-08-11: .25 mg via INTRAVENOUS
  Administered 2023-08-11: .5 mg via INTRAVENOUS
  Administered 2023-08-11: .25 mg via INTRAVENOUS

## 2023-08-11 MED ORDER — ESTRADIOL 0.05 MG/24HR TD PTWK: 0.0500 mg | MEDICATED_PATCH | TRANSDERMAL | Status: DC

## 2023-08-11 MED ORDER — ACETAMINOPHEN 10 MG/ML IV SOLN
INTRAVENOUS | Status: DC | PRN
  Administered 2023-08-11: 1000 mg via INTRAVENOUS

## 2023-08-11 MED ORDER — HEPARIN SODIUM (PORCINE) 5000 UNIT/ML IJ SOLN
INTRAMUSCULAR | Status: AC
  Filled 2023-08-11: qty 1

## 2023-08-11 MED ORDER — SUGAMMADEX SODIUM 200 MG/2ML IV SOLN
INTRAVENOUS | Status: DC | PRN
  Administered 2023-08-11: 400 mg via INTRAVENOUS

## 2023-08-11 MED ORDER — HEMOSTATIC AGENTS (NO CHARGE) OPTIME
TOPICAL | Status: DC | PRN
Start: 2023-08-11 — End: 2023-08-11

## 2023-08-11 SURGICAL SUPPLY — 60 items
APPLICATOR SURGIFLO ENDO (HEMOSTASIS) IMPLANT
BAG URINE DRAIN 2000ML AR STRL (UROLOGICAL SUPPLIES) ×2 IMPLANT
BLADE SURG 15 STRL LF DISP TIS (BLADE) ×2 IMPLANT
CANNULA DILATOR 5 W/SLV (CANNULA) ×4 IMPLANT
CATH URTH 16FR FL 2W BLN LF (CATHETERS) ×2 IMPLANT
CNTNR URN SCR LID CUP LEK RST (MISCELLANEOUS) ×2 IMPLANT
CORD MONOPOLAR M/FML 12FT (MISCELLANEOUS) IMPLANT
DERMABOND ADVANCED .7 DNX12 (GAUZE/BANDAGES/DRESSINGS) ×2 IMPLANT
DEVICE SUTURE ENDOST 10MM (ENDOMECHANICALS) IMPLANT
DRAPE STERI POUCH LG 24X46 STR (DRAPES) ×4 IMPLANT
DRAPE UNDER BUTTOCK W/FLU (DRAPES) ×2 IMPLANT
DRSG TELFA 3X8 NADH STRL (GAUZE/BANDAGES/DRESSINGS) ×2 IMPLANT
ELECT REM PT RETURN 9FT ADLT (ELECTROSURGICAL) ×1
ELECTRODE REM PT RTRN 9FT ADLT (ELECTROSURGICAL) ×2 IMPLANT
GAUZE 4X4 16PLY ~~LOC~~+RFID DBL (SPONGE) ×2 IMPLANT
GLOVE BIO SURGEON STRL SZ8 (GLOVE) ×8 IMPLANT
GLOVE BIOGEL PI IND STRL 8 (GLOVE) ×2 IMPLANT
GOWN STRL REUS W/ TWL LRG LVL3 (GOWN DISPOSABLE) ×2 IMPLANT
GOWN STRL REUS W/ TWL XL LVL3 (GOWN DISPOSABLE) ×6 IMPLANT
GRASPER SUT TROCAR 14GX15 (MISCELLANEOUS) ×2 IMPLANT
IRRIGATION STRYKERFLOW (MISCELLANEOUS) IMPLANT
IRRIGATOR STRYKERFLOW (MISCELLANEOUS)
IV LR IRRIG 3000ML ARTHROMATIC (IV SOLUTION) ×2 IMPLANT
IV NS 1000ML BAXH (IV SOLUTION) IMPLANT
KIT IMAGING PINPOINTPAQ (MISCELLANEOUS) IMPLANT
KIT PINK PAD W/HEAD ARE REST (MISCELLANEOUS) ×1
KIT PINK PAD W/HEAD ARM REST (MISCELLANEOUS) ×2 IMPLANT
KIT PROCEDURE FLUENT (KITS) ×2 IMPLANT
LABEL OR SOLS (LABEL) ×2 IMPLANT
LIGASURE VESSEL 5MM BLUNT TIP (ELECTROSURGICAL) IMPLANT
MANIFOLD NEPTUNE II (INSTRUMENTS) ×2 IMPLANT
MANIPULATOR VCARE LG CRV RETR (MISCELLANEOUS) IMPLANT
MANIPULATOR VCARE SML CRV RETR (MISCELLANEOUS) IMPLANT
MANIPULATOR VCARE STD CRV RETR (MISCELLANEOUS) IMPLANT
NDL INSUFF ACCESS 14 VERSASTEP (NEEDLE) ×2 IMPLANT
NDL SPNL 22GX3.5 QUINCKE BK (NEEDLE) IMPLANT
NEEDLE SPNL 22GX3.5 QUINCKE BK (NEEDLE)
NS IRRIG 500ML POUR BTL (IV SOLUTION) ×2 IMPLANT
OCCLUDER COLPOPNEUMO (BALLOONS) ×2 IMPLANT
PACK DNC HYST (MISCELLANEOUS) ×2 IMPLANT
PACK GYN LAPAROSCOPIC (MISCELLANEOUS) ×2 IMPLANT
PAD OB MATERNITY 11 LF (PERSONAL CARE ITEMS) ×2 IMPLANT
PAD PREP OB/GYN DISP 24X41 (PERSONAL CARE ITEMS) ×2 IMPLANT
SCISSORS LAP 5X35 DISP (ENDOMECHANICALS) ×2 IMPLANT
SEAL ROD LENS SCOPE MYOSURE (ABLATOR) ×2 IMPLANT
SET CYSTO W/LG BORE CLAMP LF (SET/KITS/TRAYS/PACK) ×2 IMPLANT
SET TUBE SMOKE EVAC HIGH FLOW (TUBING) ×2 IMPLANT
SPONGE T-LAP 4X18 ~~LOC~~+RFID (SPONGE) IMPLANT
SURGIFLO W/THROMBIN 8M KIT (HEMOSTASIS) IMPLANT
SUT ENDO VLOC 180-0-8IN (SUTURE) IMPLANT
SUT MNCRL 4-0 27 PS-2 XMFL (SUTURE) ×2
SUT VIC AB 0 CT1 36 (SUTURE) ×2 IMPLANT
SUT VICRYL 0 UR6 27IN ABS (SUTURE) ×2 IMPLANT
SUTURE MNCRL 4-0 27XMF (SUTURE) ×2 IMPLANT
SYR 10ML LL (SYRINGE) ×2 IMPLANT
SYR 50ML LL SCALE MARK (SYRINGE) ×2 IMPLANT
TRAP FLUID SMOKE EVACUATOR (MISCELLANEOUS) ×2 IMPLANT
TROCAR BLUNT TIP 12MM OMST12BT (TROCAR) ×2 IMPLANT
TROCAR VERSASTEP PLUS 12MM (TROCAR) ×2 IMPLANT
WATER STERILE IRR 500ML POUR (IV SOLUTION) ×2 IMPLANT

## 2023-08-11 NOTE — Anesthesia Procedure Notes (Signed)
Procedure Name: Intubation Date/Time: 08/11/2023 7:47 AM  Performed by: Ginger Carne, CRNAPre-anesthesia Checklist: Patient identified, Emergency Drugs available, Suction available, Patient being monitored and Timeout performed Patient Re-evaluated:Patient Re-evaluated prior to induction Oxygen Delivery Method: Circle system utilized Preoxygenation: Pre-oxygenation with 100% oxygen Induction Type: IV induction, Rapid sequence and Cricoid Pressure applied Laryngoscope Size: McGrath and 3 Grade View: Grade I Tube type: Oral Tube size: 7.0 mm Number of attempts: 1 Airway Equipment and Method: Stylet and Video-laryngoscopy Placement Confirmation: ETT inserted through vocal cords under direct vision, positive ETCO2 and breath sounds checked- equal and bilateral Secured at: 21 cm Tube secured with: Tape Dental Injury: Teeth and Oropharynx as per pre-operative assessment

## 2023-08-11 NOTE — Interval H&P Note (Signed)
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  The various methods of treatment have been discussed with the patient and family. After consideration of risks, benefits and other options for treatment, the patient has consented to  Procedure(s): TOTAL LAPAROSCOPIC HYSTERECTOMY WITH BILATERAL SALPINGO OOPHORECTOMY, possible laparotomy, POSSIBLE BIOPSY/OMENTECTOMY/WASHINGS/NODE DISSECTION/BOWEL SURGERY (N/A) DILATATION AND CURETTAGE /HYSTEROSCOPY (N/A) as a surgical intervention.  The patient's history has been reviewed, patient examined, no change in status, stable for surgery.  I have reviewed the patient's chart and labs.  Questions were answered to the patient's satisfaction.  She does wish to have a hyst if not technically difficult in addition to BSO to avoid having to take a progestin with estrogen replacement therapy. She has had 3 C sections, so also discussed possibility of supracervical hyst if there is a lot of scarring of bladder to cervix.    Leida Lauth

## 2023-08-11 NOTE — Op Note (Signed)
Procedure(s): TOTAL LAPAROSCOPIC HYSTERECTOMY WITH BILATERAL SALPINGO OOPHORECTOMY, WASHINGS Procedure Note  Suzanne Wilcox female 48 y.o. 08/11/2023  Indications: The patient is a 48 y.o. 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 She also has a history of irregular menstrual cycles for the past 2-3 years and prior tubal ligation surgery. She desires risk reducing surgery with hysterectomy and removal of ovaries.   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 Also with previous abdominal surgeries (prior C-section x 3 with tubal ligation), gallbladder removal and gastric bypass surgery.  Moderate obesity (BMI 38).   Post-operative Diagnosis: Same  Surgeon: Hildred Laser, MD  Assistants:  Leida Lauth, MD (GYN Oncology).   Anesthesia: General endotracheal anesthesia  Findings: The uterus was sounded to 9 cm. Cervix appeared normal.  Normal upper abdomen.  Fallopian tubes previously surgically interrupted but appeared normal.  Right ovary with small cystic lesion present (appearance of benign simple cyst). Left ovary appeared normal.  Minimal abdominal/pelvic adhesions.  Clear fluid with pelvic washings noted.   Procedure Details: The patient was seen in the Holding Room. The risks, benefits, complications, treatment options, and expected outcomes were discussed with the patient.  The patient concurred with the proposed plan, giving informed consent.  The site of surgery properly noted/marked.  The patient received intravenous antibiotics and had sequential compression devices applied to her lower extremities while in the preoperative area. She also received a dose of IV prophylactic heparin prior to surgery. The patient was taken to the Operating Room, identified as Suzanne Wilcox and the procedure verified as Procedure(s) (LRB): TOTAL LAPAROSCOPIC HYSTERECTOMY WITH  BILATERAL SALPINGO OOPHORECTOMY, WASHINGS (N/A). A Time Out was held and the above information confirmed.  Once in the operating room general anesthesia was administered and was found to be adequate.  She was placed in the dorsal lithotomy position, and was prepped and draped in a sterile manner.  A V-care uterine manipulator was placed at this time.  A Foley catheter was inserted into her bladder and attached to constant drainage. Attention was turned to the abdomen where an 11-mm supraumbilical incision was made and the incision was carried down to the level of the fascia through blunt dissection. The fascia was grasped with kocher clamps, tented up and incised using the Mayo scissors. The fascia was entered and the peritoneum was identified and entered bluntly.  A Hasson trochar was then inserted into the incision and inflated. Correct position was confirmed visually.  The abdomen was then insufflated until the anterior abdominal wall was sufficiently distended.  A 0-degree laparoscope was inserted and the contents of the abdominal cavity were visualized with the findings as above.  Bilateral lower quadrant ports (5 mm) and a suprapubic port (11 mm) were then placed under direct visualization.  The pelvis was then carefully examined. Pelvic washings were then collected from the posterior cul-de-sac.  Attention was then turned to the left round ligament which was clamped and transected with the Ligasure scalpel.  The broad ligament were then dissected down using the monopolar endoshears until the ureter was able to be identified.  A small window was created in the leaf of the broad ligament and the the infundibulopelvic ligament was clamped using the Ligasure scalpel and ligated.  Good hemostasis was noted. The ureter were noted to be safely away from the area of dissection.  The uterine artery was then skeletonized and a bladder flap was created.  The bladder was then bluntly dissected off the lower uterine  segment.  Attention was then turned to the patient's right side, which was treated in a similar manner by taking the infundibulopelvic ligament, the round ligament and the broad ligaments and the bladder flap creation was completed. At this point, attention was turned to the uterine vessels, which were clamped and ligated using the Ligasure scalpel.  There was small amount of bleeding noted, which was controlled using the Ligasure scaplel and the monopolar shears.  Good hemostasis was noted overall.  The uterosacral and cardinal ligaments were clamped, cut and ligated. Bilaterally. Attention was then turned to the cervicovaginal junction, and the monopolar endoshears was used to transect the cervix from the surrounding vagina using the ring of the V-care as a guide. This was done circumferentially allowing total hysterectomy.  The uterus plus cervix and bilateral adnexae were then removed from the vagina and the vaginal cuff incision was then closed with a running suture of 0-Vicryl using the Endo Stitch device.  Overall excellent hemostasis was noted.  The ureters were reexamined bilaterally and were pulsating normally. The abdominal pressure was reduced and a small amount of oozing was noted from the vaginal cuff. Hemostasis was achieved with use of the monopolar endoshears. Floseal was placed over the vaginal cuff repair.    The suprapubic trochar was removed under direct visualization.  A PMI port site closure device was used to reapproximate the fascia of this incision using a 0-Vicryl in a figure-of-eight fashion.  All other trocars were removed under direct visualization, and the abdomen was desufflated.  The fascia of the supraumbilical incision was grasped with Alice clamps and reapproximated using a 0-Vicryl figure of eight stitch.  All skin incisions were closed with 4-0 Monocryl subcuticular stitches and Dermabond. A total of 15 ml of 0.5% Sensorcaine was injected into the incisions. The patient  tolerated the procedures well.  All instruments, needles, and sponge counts were correct x 2. The patient was taken to the recovery room awake, extubated and in stable condition.   An experienced assistant was required given the standard of surgical care given the complexity of the case.  This assistant was needed for exposure, dissection, suctioning, retraction, instrument exchange, and for overall help during the procedure.   Estimated Blood Loss:  50 ml      Drains: straight catheterization prior to procedure with 1600 ml of clear urine         Total IV Fluids:  1300 ml  Specimens: Pelvic washings, Uterus with cervix, bilateral fallopian tubes and ovaries         Implants: None         Complications:  None; patient tolerated the procedure well.         Disposition: PACU - hemodynamically stable.         Condition: stable   Hildred Laser, MD [facility]

## 2023-08-11 NOTE — Anesthesia Postprocedure Evaluation (Signed)
Anesthesia Post Note  Patient: Suzanne Wilcox  Procedure(s) Performed: TOTAL LAPAROSCOPIC HYSTERECTOMY WITH BILATERAL SALPINGO OOPHORECTOMY, WASHINGS  Patient location during evaluation: PACU Anesthesia Type: General Level of consciousness: awake and alert Pain management: pain level controlled Vital Signs Assessment: post-procedure vital signs reviewed and stable Respiratory status: spontaneous breathing, nonlabored ventilation, respiratory function stable and patient connected to nasal cannula oxygen Cardiovascular status: blood pressure returned to baseline and stable Postop Assessment: no apparent nausea or vomiting Anesthetic complications: no   No notable events documented.   Last Vitals:  Vitals:   08/11/23 1145 08/11/23 1211  BP: (!) 140/80 (!) 145/86  Pulse: 75 77  Resp: 20 14  Temp: (!) 36.4 C (!) 36.4 C  SpO2: 99% 100%    Last Pain:  Vitals:   08/11/23 1211  TempSrc: Temporal  PainSc: 6                  Lenard Simmer

## 2023-08-11 NOTE — Transfer of Care (Signed)
Immediate Anesthesia Transfer of Care Note  Patient: Suzanne Wilcox  Procedure(s) Performed: TOTAL LAPAROSCOPIC HYSTERECTOMY WITH BILATERAL SALPINGO OOPHORECTOMY, WASHINGS  Patient Location: PACU  Anesthesia Type:General  Level of Consciousness: awake, alert , and oriented  Airway & Oxygen Therapy: Patient Spontanous Breathing and Patient connected to face mask oxygen  Post-op Assessment: Report given to RN and Post -op Vital signs reviewed and stable  Post vital signs: Reviewed and stable  Last Vitals:  Vitals Value Taken Time  BP 132/82 08/11/23 1058  Temp    Pulse 86 08/11/23 1058  Resp 16 08/11/23 1058  SpO2 100 % 08/11/23 1058    Last Pain:  Vitals:   08/11/23 0650  TempSrc: Oral  PainSc: 7          Complications: No notable events documented.

## 2023-08-11 NOTE — Anesthesia Preprocedure Evaluation (Signed)
Anesthesia Evaluation  Patient identified by MRN, date of birth, ID band Patient awake    Reviewed: Allergy & Precautions, NPO status , Patient's Chart, lab work & pertinent test results  History of Anesthesia Complications (+) PONV and history of anesthetic complications  Airway Mallampati: II  TM Distance: >3 FB Neck ROM: full    Dental  (+) Chipped, Dental Advidsory Given   Pulmonary former smoker   Pulmonary exam normal        Cardiovascular hypertension, negative cardio ROS Normal cardiovascular exam     Neuro/Psych  PSYCHIATRIC DISORDERS Anxiety Depression Bipolar Disorder   negative neurological ROS     GI/Hepatic Neg liver ROS,GERD  Medicated and Poorly Controlled,,  Endo/Other  negative endocrine ROS    Renal/GU      Musculoskeletal   Abdominal   Peds  Hematology negative hematology ROS (+)   Anesthesia Other Findings Patient has recently lost 150 lbs after a gastric bypass procedure. No longer has problems with sleep apnea.  Past Medical History: No date: Allergy No date: Anemia No date: Anxiety No date: Arthritis     Comment:  both knees No date: Bipolar disorder (HCC) No date: Bipolar disorder (HCC) No date: Complication of anesthesia No date: Depression No date: Family history of adverse reaction to anesthesia     Comment:  possible MOM slow to wake up No date: Generalized headaches No date: GERD (gastroesophageal reflux disease) No date: History of hiatal hernia     Comment:  small No date: Hx of varicella No date: Hyperlipidemia No date: Hypertension No date: Lymphedema No date: Missed abortions     Comment:  X 6 04/29/2012: Obesity No date: Pneumonia     Comment:  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 03/28/2016: Postoperative retention of urine 03/27/2016: Postpartum care following cesarean delivery (9/29) No date: Sleep apnea 03/22/2004: Termination of pregnancy (fetus)     Comment:   X 1 No date: Vaginal Pap smear, abnormal  Past Surgical History: 06/29/2009: CESAREAN SECTION     Comment:  X 1 [facility] - [place] 05/22/2014: CESAREAN SECTION; N/A     Comment:  Procedure: Repeat CESAREAN SECTION;  Surgeon: Lenoard Aden, MD;  Location: [facility];  Service: Obstetrics;                Laterality: N/A;  EDD: 05/31/14 03/27/2016: CESAREAN SECTION WITH BILATERAL TUBAL LIGATION; Bilateral     Comment:  Procedure: Repeat CESAREAN SECTION WITH BILATERAL TUBAL               LIGATION;  Surgeon: Olivia Mackie, MD;  Location: [facility]               BIR[facility]ING SUITES;  Service: Obstetrics;  Laterality:               Bilateral;  EDD: 04/01/16 04/29/2017: CHOLECYSTECTOMY; N/A     Comment:  Procedure: LAPAROSCOPIC CHOLECYSTECTOMY WITH               INTRAOPERATIVE CHOLANGIOGRAM;  Surgeon: Ovidio Kin,               MD;  Location: [facility];  Service: General;  Laterality:               N/A;  ERAS PATHWAY 09/04/2022: COLONOSCOPY WITH PROPOFOL; N/A     Comment:  Procedure: COLONOSCOPY WITH PROPOFOL;  Surgeon: Allegra Lai,  Loel Dubonnet, MD;  Location: [facility];  Service:               Gastroenterology;  Laterality: N/A;  2005, 2010: DILATION AND CURETTAGE OF UTERUS     Comment:    x2 MAB 01/11/2017: ESOPHAGOGASTRODUODENOSCOPY; N/A     Comment:  Procedure: ESOPHAGOGASTRODUODENOSCOPY (EGD);  Surgeon:               Kerin Salen, MD;  Location: [facility];  Service:               Gastroenterology;  Laterality: N/A; 03/2022: GASTRIC BYPASS 03/26/2016: IR GENERIC HISTORICAL     Comment:  IR FLUORO GUIDE CV LINE RIGHT 03/26/2016 [facility]-INTERV RAD 03/26/2016: IR GENERIC HISTORICAL     Comment:  IR US GUIDE VASC ACCESS RIGHT 03/26/2016 [facility]-INTER[facility] RAD No date: WISDOM TOOTH EXTRACTION  BMI    Body Mass Index: 38.36 kg/m      Reproductive/Obstetrics negative OB ROS                             Anesthesia Physical Anesthesia Plan  ASA: 2  Anesthesia  Plan: General   Post-op Pain Management:    Induction: Intravenous  PONV Risk Score and Plan: 3 and Ondansetron, Dexamethasone, Midazolam and TIVA  Airway Management Planned: Oral ETT  Additional Equipment:   Intra-op Plan:   Post-operative Plan: Extubation in OR  Informed Consent: I have reviewed the patients History and Physical, chart, labs and discussed the procedure including the risks, benefits and alternatives for the proposed anesthesia with the patient or authorized representative who has indicated his/her understanding and acceptance.     Dental Advisory Given  Plan Discussed with: Anesthesiologist, CRNA and Surgeon  Anesthesia Plan Comments: (Patient consented for risks of anesthesia including but not limited to:  - adverse reactions to medications - damage to eyes, teeth, lips or other oral mucosa - nerve damage due to positioning  - sore throat or hoarseness - Damage to heart, brain, nerves, lungs, other parts of body or loss of life  Patient voiced understanding and assent.)       Anesthesia Quick Evaluation

## 2023-08-12 ENCOUNTER — Encounter: Payer: Self-pay | Admitting: Obstetrics and Gynecology

## 2023-08-12 ENCOUNTER — Other Ambulatory Visit: Payer: 59

## 2023-08-12 LAB — CYTOLOGY - NON PAP

## 2023-08-13 ENCOUNTER — Telehealth: Payer: Self-pay | Admitting: Nurse Practitioner

## 2023-08-13 LAB — SURGICAL PATHOLOGY

## 2023-08-13 NOTE — Telephone Encounter (Signed)
Called patient. She's recovering well. Pain is better controlled. No concerns currently.

## 2023-08-17 NOTE — Progress Notes (Deleted)
 OBSTETRICS/GYNECOLOGY POST-OPERATIVE CLINIC VISIT  Subjective:     Suzanne Wilcox is a 48 y.o. 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 Eating a regular diet {with-without:5700} difficulty. Bowel movements are {normal/abnormal***:19619}. {pain control:13522::"The patient is not having any pain."}  {Common ambulatory SmartLinks:19316}  Review of Systems {ros; complete:30496}   Objective:   LMP 07/12/2023  There is no height or weight on file to calculate BMI.  General:  alert and no distress  Abdomen: soft, bowel sounds active, non-tender  Incision:   {incision:13716::"no dehiscence","incision well approximated","healing well","no drainage","no erythema","no hernia","no seroma","no swelling"}    Pathology:   MRN: [id number] Physician: Leida Lauth DOB/Age 05-Mar-1976 (Age: 35) Gender: F Collected Date: 08/11/2023 Received Date: 08/12/2023  FINAL DIAGNOSIS       1. Uterus, cervix and bilateral fallopian tubes, and ovaries :            CERVIX:      UNREMARKABLE.      NEGATIVE FOR DYSPLASIA OR MALIGNANCY.      ENDOCERVIX:      NABOTHIAN CYSTS.      NEGATIVE FOR HYPERPLASIA, ATYPIA OR MALIGNANCY.      ENDOMETRIUM:      BENIGN PROLIFERATIVE ENDOMETRIUM.      NEGATIVE FOR HYPERPLASIA, ATYPIA OR MALIGNANCY.            MYOMETRIUM:      FOCAL ADENOMYOSIS.      NEGATIVE FOR MALIGNANCY.      SEROSA:      UNREMARKABLE.      NEGATIVE FOR MALIGNANCY.            BILATERAL FALLOPIAN TUBES:      BENIGN FIMBRIATED FALLOPIAN TUBES WITH PARATUBAL CYSTS.      NEGATIVE FOR MALIGNANCY.      RIGHT OVARY:      BENIGN SEROUS CYSTADENOMA.      HEMORRHAGIC FOLLICLE CYST.      NEGATIVE FOR MALIGNANCY.      LEFT OVARY:      HEMORRHAGIC FOLLICLE CYST.      NEGATIVE FOR MALIGNANCY.       DATE SIGNED OUT: 08/13/2023 ELECTRONIC  SIGNATURE : Lance Coon Md, Pathologist, Electronic Signature  MICROSCOPIC DESCRIPTION  CASE COMMENTS STAINS USED IN DIAGNOSIS: H&E H&E H&E H&E H&E H&E H&E H&E H&E H&E H&E H&E H&E H&E H&E H&E H&E H&E H&E H&E H&E H&E H&E H&E H&E H&E H&E H&E H&E H&E H&E H&E H&E H&E H&E H&E    CLINICAL HISTORY  SPECIMEN(S) OBTAINED 1. Uterus, cervix and bilateral fallopian tubes, And Ovaries  SPECIMEN COMMENTS: SPECIMEN CLINICAL INFORMATION: 1. BRACA2 mutation carrier ovarian cyst, elevated HE4    Gross Description 1. Specimen: Hysterectomy with bilateral salpingo-oophorectomy      Weight/integrity: 180.4 g (fresh); intact      Measurements: 10.5 cm superior-inferior; 6.6 cm cornu-cornu; 4.3 cm      anterior-posterior      Cervix: 3.3 x 3.0 cm; 0.8 cm slit-like os      Ectocervix: White-tan, predominantly smooth, and glistening without discrete      lesions.      Endocervix: White-tan moderately trabeculated without discrete lesions.      Serosa: Pink-tan with a scant amount of fibrous adhesions on the posterior      surface and a single 0.5 x 0.4 x 0.1 cm cyst on the anterior surface.  Endometrium: 4.2 cm superior-inferior, 4.3 cm cornu-cornu, 0.1 cm thick;      tan-brown and mildly roughened without discrete lesions.      Myometrium: Up to 2.8 cm thick; pink-tan and mildly trabeculated without      discrete lesions.      Right adnexa: Moderately hemorrhagic soft tissue without discrete lesions.      Fallopian tube: Previously ligated; 3.3 cm (proximal) and 4.8 cm (distal) long,      0.6 cm in diameter (average); multiple paratubal cysts (0.1-0.4 cm in greatest      dimension); purple-pink, tortuous, fimbriated tube. The cut surfaces are grossly      unremarkable without discrete lesions.      Ovary:  4.2 x 3.0 x 1.5 cm; white-yellow, cerebriform surface with a focal area      of pink-purple discoloration. There is a 1.0 cm linear incision with regular,       smooth edges in the discolored area that directly communicates with a 3.1 cm      smooth-walled cystic structure with hemorrhagic staining; discrete papillary      excrescences or contents are not identified. There is an additional 1.5 cm cyst      with a tan-gray, smooth wall and contains thin, tan fluid; papillary      excrescences are grossly absent. The remaining cut surfaces are solid and      heterogenous.      Left adnexa:  Mildly hemorrhagic soft tissue without discrete lesions.      Fallopian tube: Previously ligated; 2.3 cm (proximal) and 4.5 cm (distal) long,      0.7 cm diameter (average); multiple paratubal cysts (0.1 height and 0.7 cm in      greatest dimension); purpleh-pink, tortuous, fimbriated tube with unremarkable      cut surfaces. Discrete lesions are absent.      Ovary:  3.5 x 3.5 x 1.8 cm; tan-yellow, cerebriform surface with a focal area of      purple-gray discoloration. There is a linear incision with regular, smooth edges      at directly communicates with a 2.3 cm smooth-walled cystic structure without      papillary excrescences or contents. The remaining cut surfaces consist of two      additional 1.5 cm intact cysts without papillary excrescences that contain thin,      hemorrhagic fluid. The remaining cut surfaces are solid and heterogenous.      Note: SEE-FIM protocol performed.      Block summary:      1A: Anterior cervix      1B: Posterior cervix      1C: Anterior lower uterine segment (distal inked green)      1D: Posterior low uterine segment (distal inked black)      1E: Anterior endomyometrium (full thickness) including cyst on serosa      (representative)      45F-G: Posterior endomyometrium, full thickness      45F: Bisected, including fibrous adhesions on serosa (representative)      1H-U: Right adnexa      1H-M: Fallopian tube, entire      1H-I: Proximal      1J-M: Distal      1L-M: Including fimbria, serially sectioned      1N-U: Ovary,  entire      1V-ZJ: Left adnexa      1V-ZA: Fallopian tube, entire      1V: Proximal      1W-Z: Distal  1Z-ZA: Fimbria, serially sectioned      1ZB-ZJ: Ovary, entire      Collection time: 08/11/2023 at 0939      Time in formalin: 08/11/2023 at 1135      Cold ischemia time: 116 minutes    Assessment:   Patient s/p TOTAL LAPAROSCOPIC HYSTERECTOMY WITH BILATERAL SALPINGO OOPHORECTOMY, WASHINGS  (surgery)  {doing well:13525::"Doing well postoperatively."}   Plan:   1. Continue any current medications as instructed by provider. 2. Wound care discussed. 3. Operative findings again reviewed. Pathology report discussed. 4. Activity restrictions: {restrictions:13723} 5. Anticipated return to work: {work return:14002}. 6. Follow up: {0-86:57846} {time; units:18646} for ***    Hildred Laser, MD [facility]

## 2023-08-18 ENCOUNTER — Encounter: Payer: 59 | Admitting: Obstetrics and Gynecology

## 2023-08-18 ENCOUNTER — Encounter: Payer: Self-pay | Admitting: Obstetrics and Gynecology

## 2023-08-18 ENCOUNTER — Ambulatory Visit: Payer: 59 | Admitting: Obstetrics and Gynecology

## 2023-08-18 VITALS — BP 129/84 | HR 77 | Resp 16 | Ht 67.5 in | Wt 253.5 lb

## 2023-08-18 DIAGNOSIS — Z8742 Personal history of other diseases of the female genital tract: Secondary | ICD-10-CM

## 2023-08-18 DIAGNOSIS — Z90710 Acquired absence of both cervix and uterus: Secondary | ICD-10-CM

## 2023-08-18 DIAGNOSIS — Z1509 Genetic susceptibility to other malignant neoplasm: Secondary | ICD-10-CM

## 2023-08-18 DIAGNOSIS — G8918 Other acute postprocedural pain: Secondary | ICD-10-CM

## 2023-08-18 DIAGNOSIS — Z4889 Encounter for other specified surgical aftercare: Secondary | ICD-10-CM

## 2023-08-18 MED ORDER — ACETAMINOPHEN 500 MG PO TABS: 500.0000 mg | ORAL_TABLET | Freq: Four times a day (QID) | ORAL | 0 refills | Status: AC | PRN

## 2023-08-18 MED ORDER — OXYCODONE HCL 5 MG PO TABS: 5.0000 mg | ORAL_TABLET | Freq: Four times a day (QID) | ORAL | 0 refills | Status: DC | PRN

## 2023-08-18 NOTE — Progress Notes (Signed)
OBSTETRICS/GYNECOLOGY POST-OPERATIVE CLINIC VISIT  Subjective:     Suzanne Wilcox is a 48 y.o. 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 Eating a regular diet with difficulty, she has some nausea and she gets full faster. Bowel movements are normal with gas and bloating. Pain is not well controlled.  Medications being used: narcotic analgesics including oxycodone/acetaminophen (Percocet, Tylox).  The following portions of the patient's history were reviewed and updated as appropriate: allergies, current medications, past family history, past medical history, past social history, past surgical history, and problem list.  Review of Systems Pertinent items noted in HPI and remainder of comprehensive ROS otherwise negative.   Objective:   BP 129/84   Pulse 77   Resp 16   Ht 5' 7.5" (1.715 m)   Wt 253 lb 8 oz (115 kg)   LMP 07/12/2023   BMI 39.12 kg/m  Body mass index is 39.12 kg/m.  General:  alert and no distress  Abdomen: soft, bowel sounds active, non-tender  Incision:   healing well, no drainage, no erythema, no hernia, no seroma, no swelling, no dehiscence, incision well approximated    Pathology:   MRN: [id number] Physician: Leida Lauth DOB/Age May 13, 1976 (Age: 30) Gender: F Collected Date: 08/11/2023 Received Date: 08/12/2023  FINAL DIAGNOSIS       1. Uterus, cervix and bilateral fallopian tubes, and ovaries :            CERVIX:      UNREMARKABLE.      NEGATIVE FOR DYSPLASIA OR MALIGNANCY.      ENDOCERVIX:      NABOTHIAN CYSTS.      NEGATIVE FOR HYPERPLASIA, ATYPIA OR MALIGNANCY.      ENDOMETRIUM:      BENIGN PROLIFERATIVE ENDOMETRIUM.      NEGATIVE FOR HYPERPLASIA, ATYPIA OR MALIGNANCY.            MYOMETRIUM:      FOCAL ADENOMYOSIS.      NEGATIVE FOR MALIGNANCY.      SEROSA:      UNREMARKABLE.       NEGATIVE FOR MALIGNANCY.            BILATERAL FALLOPIAN TUBES:      BENIGN FIMBRIATED FALLOPIAN TUBES WITH PARATUBAL CYSTS.      NEGATIVE FOR MALIGNANCY.      RIGHT OVARY:      BENIGN SEROUS CYSTADENOMA.      HEMORRHAGIC FOLLICLE CYST.      NEGATIVE FOR MALIGNANCY.      LEFT OVARY:      HEMORRHAGIC FOLLICLE CYST.      NEGATIVE FOR MALIGNANCY.   Assessment:   Patient s/p TOTAL LAPAROSCOPIC HYSTERECTOMY WITH BILATERAL SALPINGO- OOPHORECTOMY, WASHINGS   Postoperative course complicated by postoperative pain   Plan:   1. Continue any current medications as instructed by provider.  Advised on Gas-X and chewing gum for bloating/gas pain.  Will change pain prescription to Roxicodone so patient can take 1000 mg of Tylenol q 6 hrs.  Cannot take NSAIDs due to h/o gastric bypass 2. Wound care discussed. 3. Operative findings again reviewed. Pathology report discussed. 4. Activity restrictions: no bending, stooping, or squatting, no lifting more than 15 pounds, no overhead lifting, and pelvic rest 5. Anticipated return to work:  5-6 weeks . 6. Follow up:  4-5  weeks for final post-operative visit with GYN Oncology.  Hildred Laser, MD [facility]

## 2023-08-23 ENCOUNTER — Telehealth: Payer: Self-pay | Admitting: Plastic Surgery

## 2023-08-25 ENCOUNTER — Encounter: Payer: Self-pay | Admitting: Obstetrics and Gynecology

## 2023-08-26 ENCOUNTER — Ambulatory Visit
Admission: EM | Admit: 2023-08-26 | Discharge: 2023-08-26 | Disposition: A | Discharge status: Home / Self Care | Payer: 59 | Attending: Emergency Medicine | Admitting: Emergency Medicine

## 2023-08-26 ENCOUNTER — Emergency Department: Payer: 59 | Admitting: General Practice

## 2023-08-26 ENCOUNTER — Other Ambulatory Visit: Payer: Self-pay

## 2023-08-26 ENCOUNTER — Encounter: Payer: 59 | Admitting: Obstetrics and Gynecology

## 2023-08-26 ENCOUNTER — Encounter: Payer: Self-pay | Admitting: Emergency Medicine

## 2023-08-26 ENCOUNTER — Encounter
Admission: EM | Disposition: A | Discharge status: Home / Self Care | Payer: Self-pay | Source: Home / Self Care | Attending: Emergency Medicine

## 2023-08-26 ENCOUNTER — Emergency Department: Payer: 59

## 2023-08-26 DIAGNOSIS — F419 Anxiety disorder, unspecified: Secondary | ICD-10-CM | POA: Diagnosis not present

## 2023-08-26 DIAGNOSIS — Z1501 Genetic susceptibility to malignant neoplasm of breast: Secondary | ICD-10-CM | POA: Insufficient documentation

## 2023-08-26 DIAGNOSIS — K219 Gastro-esophageal reflux disease without esophagitis: Secondary | ICD-10-CM | POA: Diagnosis not present

## 2023-08-26 DIAGNOSIS — R1030 Lower abdominal pain, unspecified: Secondary | ICD-10-CM | POA: Diagnosis not present

## 2023-08-26 DIAGNOSIS — N99840 Postprocedural hematoma of a genitourinary system organ or structure following a genitourinary system procedure: Secondary | ICD-10-CM

## 2023-08-26 DIAGNOSIS — Z87891 Personal history of nicotine dependence: Secondary | ICD-10-CM | POA: Insufficient documentation

## 2023-08-26 DIAGNOSIS — K660 Peritoneal adhesions (postprocedural) (postinfection): Secondary | ICD-10-CM | POA: Diagnosis not present

## 2023-08-26 DIAGNOSIS — Z90710 Acquired absence of both cervix and uterus: Secondary | ICD-10-CM | POA: Diagnosis not present

## 2023-08-26 DIAGNOSIS — N99820 Postprocedural hemorrhage and hematoma of a genitourinary system organ or structure following a genitourinary system procedure: Secondary | ICD-10-CM | POA: Diagnosis present

## 2023-08-26 DIAGNOSIS — F319 Bipolar disorder, unspecified: Secondary | ICD-10-CM | POA: Insufficient documentation

## 2023-08-26 DIAGNOSIS — I10 Essential (primary) hypertension: Secondary | ICD-10-CM | POA: Insufficient documentation

## 2023-08-26 DIAGNOSIS — Z90722 Acquired absence of ovaries, bilateral: Secondary | ICD-10-CM | POA: Diagnosis not present

## 2023-08-26 DIAGNOSIS — Z9049 Acquired absence of other specified parts of digestive tract: Secondary | ICD-10-CM | POA: Diagnosis not present

## 2023-08-26 HISTORY — PX: LAPAROSCOPY: SHX197

## 2023-08-26 LAB — COMPREHENSIVE METABOLIC PANEL
ALT: 19 U/L (ref 0–44)
AST: 25 U/L (ref 15–41)
Albumin: 3.6 g/dL (ref 3.5–5.0)
Alkaline Phosphatase: 51 U/L (ref 38–126)
Anion gap: 8 (ref 5–15)
BUN: 18 mg/dL (ref 6–20)
CO2: 24 mmol/L (ref 22–32)
Calcium: 8.5 mg/dL — ABNORMAL LOW (ref 8.9–10.3)
Chloride: 100 mmol/L (ref 98–111)
Creatinine, Ser: 0.68 mg/dL (ref 0.44–1.00)
GFR, Estimated: >60 mL/min (ref >60)
Glucose, Bld: 116 mg/dL — ABNORMAL HIGH (ref 70–99)
Potassium: 3.7 mmol/L (ref 3.5–5.1)
Sodium: 132 mmol/L — ABNORMAL LOW (ref 135–145)
Total Bilirubin: 0.6 mg/dL (ref 0.0–1.2)
Total Protein: 6.2 g/dL — ABNORMAL LOW (ref 6.5–8.1)

## 2023-08-26 LAB — TYPE AND SCREEN
ABO/RH(D): O POS
Antibody Screen: NEGATIVE

## 2023-08-26 LAB — CBC
HCT: 35.8 % — ABNORMAL LOW (ref 36.0–46.0)
Hemoglobin: 12 g/dL (ref 12.0–15.0)
MCH: 31 pg (ref 26.0–34.0)
MCHC: 33.5 g/dL (ref 30.0–36.0)
MCV: 92.5 fL (ref 80.0–100.0)
Platelets: 287 10*3/uL (ref 150–400)
RBC: 3.87 MIL/uL (ref 3.87–5.11)
RDW: 14.1 % (ref 11.5–15.5)
WBC: 7.3 10*3/uL (ref 4.0–10.5)
nRBC: 0 % (ref 0.0–0.2)

## 2023-08-26 LAB — HEMOGLOBIN AND HEMATOCRIT, BLOOD
HCT: 32.6 % — ABNORMAL LOW (ref 36.0–46.0)
Hemoglobin: 10.9 g/dL — ABNORMAL LOW (ref 12.0–15.0)

## 2023-08-26 SURGERY — LAPAROSCOPY, DIAGNOSTIC
Anesthesia: General

## 2023-08-26 MED ORDER — FENTANYL CITRATE (PF) 100 MCG/2ML IJ SOLN
INTRAMUSCULAR | Status: AC
  Filled 2023-08-26: qty 2

## 2023-08-26 MED ORDER — HYDROMORPHONE HCL 1 MG/ML IJ SOLN
0.5000 mg | INTRAMUSCULAR | Status: DC | PRN
  Administered 2023-08-26 (×2): 0.5 mg via INTRAVENOUS

## 2023-08-26 MED ORDER — SUCCINYLCHOLINE CHLORIDE 200 MG/10ML IV SOSY
PREFILLED_SYRINGE | INTRAVENOUS | Status: DC | PRN
  Administered 2023-08-26: 100 mg via INTRAVENOUS

## 2023-08-26 MED ORDER — OXYCODONE HCL 5 MG/5ML PO SOLN: 5.0000 mg | Freq: Once | ORAL | Status: AC | PRN

## 2023-08-26 MED ORDER — LIDOCAINE HCL (PF) 2 % IJ SOLN
INTRAMUSCULAR | Status: AC
  Filled 2023-08-26: qty 5

## 2023-08-26 MED ORDER — PHENYLEPHRINE 80 MCG/ML (10ML) SYRINGE FOR IV PUSH (FOR BLOOD PRESSURE SUPPORT)
PREFILLED_SYRINGE | INTRAVENOUS | Status: DC | PRN
  Administered 2023-08-26 (×5): 80 ug via INTRAVENOUS

## 2023-08-26 MED ORDER — MIDAZOLAM HCL 2 MG/2ML IJ SOLN
INTRAMUSCULAR | Status: DC | PRN
  Administered 2023-08-26: 2 mg via INTRAVENOUS

## 2023-08-26 MED ORDER — MORPHINE SULFATE (PF) 4 MG/ML IV SOLN
4.0000 mg | Freq: Once | INTRAVENOUS | Status: AC
  Administered 2023-08-26: 4 mg via INTRAVENOUS
  Filled 2023-08-26: qty 1

## 2023-08-26 MED ORDER — TRANEXAMIC ACID-NACL 1000-0.7 MG/100ML-% IV SOLN
1000.0000 mg | Freq: Once | INTRAVENOUS | Status: DC
  Filled 2023-08-26: qty 100

## 2023-08-26 MED ORDER — IOHEXOL 300 MG/ML  SOLN
100.0000 mL | Freq: Once | INTRAMUSCULAR | Status: AC | PRN
  Administered 2023-08-26: 100 mL via INTRAVENOUS

## 2023-08-26 MED ORDER — HYDROMORPHONE HCL 1 MG/ML IJ SOLN: 1.0000 mg | Freq: Once | INTRAMUSCULAR | Status: DC

## 2023-08-26 MED ORDER — OXYCODONE HCL 5 MG PO TABS
5.0000 mg | ORAL_TABLET | Freq: Once | ORAL | Status: AC | PRN
  Administered 2023-08-26: 5 mg via ORAL

## 2023-08-26 MED ORDER — MIDAZOLAM HCL 2 MG/2ML IJ SOLN
INTRAMUSCULAR | Status: AC
  Filled 2023-08-26: qty 2

## 2023-08-26 MED ORDER — METRONIDAZOLE 500 MG PO TABS: 500.0000 mg | ORAL_TABLET | Freq: Two times a day (BID) | ORAL | 0 refills | Status: AC

## 2023-08-26 MED ORDER — 0.9 % SODIUM CHLORIDE (POUR BTL) OPTIME
TOPICAL | Status: DC | PRN
  Administered 2023-08-26: 500 mL

## 2023-08-26 MED ORDER — ONDANSETRON HCL 4 MG/2ML IJ SOLN
4.0000 mg | Freq: Once | INTRAMUSCULAR | Status: AC
  Administered 2023-08-26: 4 mg via INTRAVENOUS

## 2023-08-26 MED ORDER — FENTANYL CITRATE (PF) 100 MCG/2ML IJ SOLN
25.0000 ug | INTRAMUSCULAR | Status: DC | PRN
  Administered 2023-08-26 (×3): 50 ug via INTRAVENOUS

## 2023-08-26 MED ORDER — HYDROMORPHONE HCL 1 MG/ML IJ SOLN
0.5000 mg | Freq: Once | INTRAMUSCULAR | Status: AC
  Administered 2023-08-26: 0.5 mg via INTRAVENOUS
  Filled 2023-08-26: qty 0.5

## 2023-08-26 MED ORDER — PROPOFOL 10 MG/ML IV BOLUS
INTRAVENOUS | Status: DC | PRN
  Administered 2023-08-26: 150 mg via INTRAVENOUS

## 2023-08-26 MED ORDER — SODIUM CHLORIDE 0.9 % IV SOLN: INTRAVENOUS | Status: DC | PRN

## 2023-08-26 MED ORDER — SUGAMMADEX SODIUM 200 MG/2ML IV SOLN
INTRAVENOUS | Status: AC
  Filled 2023-08-26: qty 2

## 2023-08-26 MED ORDER — HEMOSTATIC AGENTS (NO CHARGE) OPTIME
TOPICAL | Status: DC | PRN
  Administered 2023-08-26: 1 via TOPICAL

## 2023-08-26 MED ORDER — BUPIVACAINE LIPOSOME 1.3 % IJ SUSP
INTRAMUSCULAR | Status: DC | PRN
  Administered 2023-08-26: 40 mL via SUBCUTANEOUS

## 2023-08-26 MED ORDER — DIPHENHYDRAMINE HCL 50 MG/ML IJ SOLN
INTRAMUSCULAR | Status: DC | PRN
  Administered 2023-08-26: 25 mg via INTRAVENOUS

## 2023-08-26 MED ORDER — SODIUM CHLORIDE 0.9 % IV BOLUS
1000.0000 mL | Freq: Once | INTRAVENOUS | Status: AC
  Administered 2023-08-26: 1000 mL via INTRAVENOUS

## 2023-08-26 MED ORDER — ONDANSETRON 4 MG PO TBDP
4.0000 mg | ORAL_TABLET | Freq: Once | ORAL | Status: AC
  Administered 2023-08-26: 4 mg via ORAL
  Filled 2023-08-26: qty 1

## 2023-08-26 MED ORDER — HYDROMORPHONE HCL 1 MG/ML IJ SOLN
INTRAMUSCULAR | Status: AC
  Filled 2023-08-26: qty 1

## 2023-08-26 MED ORDER — ACETAMINOPHEN 500 MG PO TABS: 1000.0000 mg | ORAL_TABLET | ORAL | Status: DC

## 2023-08-26 MED ORDER — GABAPENTIN 300 MG PO CAPS: 300.0000 mg | ORAL_CAPSULE | ORAL | Status: DC

## 2023-08-26 MED ORDER — DEXAMETHASONE SODIUM PHOSPHATE 10 MG/ML IJ SOLN
INTRAMUSCULAR | Status: AC
  Filled 2023-08-26: qty 1

## 2023-08-26 MED ORDER — SUGAMMADEX SODIUM 200 MG/2ML IV SOLN
INTRAVENOUS | Status: DC | PRN
  Administered 2023-08-26: 200 mg via INTRAVENOUS

## 2023-08-26 MED ORDER — FENTANYL CITRATE (PF) 100 MCG/2ML IJ SOLN
50.0000 ug | Freq: Once | INTRAMUSCULAR | Status: AC
  Administered 2023-08-26: 50 ug via INTRAVENOUS

## 2023-08-26 MED ORDER — DIPHENHYDRAMINE HCL 50 MG/ML IJ SOLN
INTRAMUSCULAR | Status: AC
  Filled 2023-08-26: qty 1

## 2023-08-26 MED ORDER — LACTATED RINGERS IV SOLN: INTRAVENOUS | Status: DC

## 2023-08-26 MED ORDER — OXYCODONE HCL 5 MG PO TABS
ORAL_TABLET | ORAL | Status: AC
  Filled 2023-08-26: qty 1

## 2023-08-26 MED ORDER — BUPIVACAINE LIPOSOME 1.3 % IJ SUSP
INTRAMUSCULAR | Status: AC
  Filled 2023-08-26: qty 20

## 2023-08-26 MED ORDER — BUPIVACAINE HCL (PF) 0.5 % IJ SOLN
INTRAMUSCULAR | Status: AC
  Filled 2023-08-26: qty 30

## 2023-08-26 MED ORDER — FENTANYL CITRATE (PF) 100 MCG/2ML IJ SOLN
INTRAMUSCULAR | Status: DC | PRN
  Administered 2023-08-26 (×2): 50 ug via INTRAVENOUS

## 2023-08-26 MED ORDER — EPHEDRINE SULFATE-NACL 50-0.9 MG/10ML-% IV SOSY
PREFILLED_SYRINGE | INTRAVENOUS | Status: DC | PRN
  Administered 2023-08-26: 10 mg via INTRAVENOUS
  Administered 2023-08-26: 5 mg via INTRAVENOUS
  Administered 2023-08-26: 10 mg via INTRAVENOUS

## 2023-08-26 MED ORDER — ONDANSETRON HCL 4 MG/2ML IJ SOLN
4.0000 mg | Freq: Once | INTRAMUSCULAR | Status: AC
  Administered 2023-08-26: 4 mg via INTRAVENOUS
  Filled 2023-08-26: qty 2

## 2023-08-26 MED ORDER — OXYCODONE HCL 5 MG PO TABS: 5.0000 mg | ORAL_TABLET | Freq: Four times a day (QID) | ORAL | 0 refills | Status: DC | PRN

## 2023-08-26 MED ORDER — KETAMINE HCL 50 MG/5ML IJ SOSY
PREFILLED_SYRINGE | INTRAMUSCULAR | Status: AC
  Filled 2023-08-26: qty 5

## 2023-08-26 MED ORDER — DEXMEDETOMIDINE HCL IN NACL 80 MCG/20ML IV SOLN
INTRAVENOUS | Status: DC | PRN
  Administered 2023-08-26: 12 ug via INTRAVENOUS
  Administered 2023-08-26: 8 ug via INTRAVENOUS

## 2023-08-26 MED ORDER — DEXAMETHASONE SODIUM PHOSPHATE 10 MG/ML IJ SOLN
INTRAMUSCULAR | Status: DC | PRN
  Administered 2023-08-26: 10 mg via INTRAVENOUS

## 2023-08-26 MED ORDER — KETAMINE HCL 10 MG/ML IJ SOLN
INTRAMUSCULAR | Status: DC | PRN
  Administered 2023-08-26: 20 mg via INTRAVENOUS

## 2023-08-26 MED ORDER — SEVOFLURANE IN SOLN
RESPIRATORY_TRACT | Status: AC
  Filled 2023-08-26: qty 250

## 2023-08-26 MED ORDER — ROCURONIUM BROMIDE 100 MG/10ML IV SOLN
INTRAVENOUS | Status: DC | PRN
  Administered 2023-08-26: 20 mg via INTRAVENOUS
  Administered 2023-08-26: 40 mg via INTRAVENOUS

## 2023-08-26 MED ORDER — ONDANSETRON HCL 4 MG/2ML IJ SOLN
INTRAMUSCULAR | Status: DC
Start: 2023-08-26 — End: 2023-08-27
  Filled 2023-08-26: qty 2

## 2023-08-26 SURGICAL SUPPLY — 48 items
APPLICATOR ARISTA FLEXITIP XL (MISCELLANEOUS) IMPLANT
BLADE SURG SZ11 CARB STEEL (BLADE) ×2 IMPLANT
CATH ROBINSON RED A/P 16FR (CATHETERS) ×2 IMPLANT
CHLORAPREP W/TINT 26 (MISCELLANEOUS) ×2 IMPLANT
CORD MONOPOLAR M/FML 12FT (MISCELLANEOUS) IMPLANT
DERMABOND ADVANCED .7 DNX12 (GAUZE/BANDAGES/DRESSINGS) ×2 IMPLANT
DRSG TELFA 3X4 N-ADH STERILE (GAUZE/BANDAGES/DRESSINGS) IMPLANT
DRSG TELFA 3X8 NADH STRL (GAUZE/BANDAGES/DRESSINGS) IMPLANT
ELECT REM PT RETURN 9FT ADLT (ELECTROSURGICAL) ×1 IMPLANT
ELECTRODE REM PT RTRN 9FT ADLT (ELECTROSURGICAL) ×2 IMPLANT
GAUZE 4X4 16PLY ~~LOC~~+RFID DBL (SPONGE) ×4 IMPLANT
GLOVE BIO SURGEON STRL SZ 6.5 (GLOVE) ×4 IMPLANT
GLOVE INDICATOR 7.0 STRL GRN (GLOVE) ×2 IMPLANT
GLOVE PI ORTHO PRO STRL 7.5 (GLOVE) ×2 IMPLANT
GOWN STRL REUS W/ TWL LRG LVL3 (GOWN DISPOSABLE) ×6 IMPLANT
GOWN STRL REUS W/TWL XL LVL4 (GOWN DISPOSABLE) ×2 IMPLANT
GRASPER SUT TROCAR 14GX15 (MISCELLANEOUS) IMPLANT
HEMOSTAT ARISTA ABSORB 1G (HEMOSTASIS) IMPLANT
IRRIGATION STRYKERFLOW (MISCELLANEOUS) ×2 IMPLANT
IRRIGATOR STRYKERFLOW (MISCELLANEOUS) ×1 IMPLANT
IV LACTATED RINGERS 1000ML (IV SOLUTION) ×2 IMPLANT
KIT PINK PAD W/HEAD ARE REST (MISCELLANEOUS) ×1 IMPLANT
KIT PINK PAD W/HEAD ARM REST (MISCELLANEOUS) ×2 IMPLANT
KIT TURNOVER CYSTO (KITS) ×2 IMPLANT
MANIFOLD NEPTUNE II (INSTRUMENTS) ×2 IMPLANT
NDL INSUFFLATION 14GA 120MM (NEEDLE) IMPLANT
NEEDLE INSUFFLATION 14GA 120MM (NEEDLE) ×1 IMPLANT
NS IRRIG 500ML POUR BTL (IV SOLUTION) ×2 IMPLANT
PACK DNC HYST (MISCELLANEOUS) ×2 IMPLANT
PACK GYN LAPAROSCOPIC (MISCELLANEOUS) ×2 IMPLANT
PAD OB MATERNITY 11 LF (PERSONAL CARE ITEMS) ×2 IMPLANT
PAD PREP OB/GYN DISP 24X41 (PERSONAL CARE ITEMS) ×2 IMPLANT
SCISSORS METZENBAUM CVD 33 (INSTRUMENTS) IMPLANT
SCRUB CHG 4% DYNA-HEX 4OZ (MISCELLANEOUS) ×2 IMPLANT
SET CYSTO W/LG BORE CLAMP LF (SET/KITS/TRAYS/PACK) IMPLANT
SET TUBE SMOKE EVAC HIGH FLOW (TUBING) ×2 IMPLANT
SHEARS HARMONIC 36 ACE (MISCELLANEOUS) IMPLANT
SLEEVE Z-THREAD 5X100MM (TROCAR) ×2 IMPLANT
SUT VIC AB 3-0 SH 27X BRD (SUTURE) IMPLANT
SUT VICRYL 0 UR6 27IN ABS (SUTURE) ×2 IMPLANT
SYS BAG RETRIEVAL 10MM (BASKET) IMPLANT
SYSTEM BAG RETRIEVAL 10MM (BASKET) IMPLANT
TOWEL OR 17X26 4PK STRL BLUE (TOWEL DISPOSABLE) ×2 IMPLANT
TRAP FLUID SMOKE EVACUATOR (MISCELLANEOUS) ×2 IMPLANT
TROCAR XCEL UNIV SLVE 11M 100M (ENDOMECHANICALS) IMPLANT
TROCAR Z-THRD FIOS HNDL 11X100 (TROCAR) IMPLANT
TROCAR Z-THREAD FIOS 5X100MM (TROCAR) ×2 IMPLANT
WATER STERILE IRR 500ML POUR (IV SOLUTION) ×2 IMPLANT
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
## 2023-08-26 NOTE — Telephone Encounter (Signed)
 Ok thank you

## 2023-08-26 NOTE — Anesthesia Procedure Notes (Signed)
 Procedure Name: Intubation Date/Time: 08/26/2023 5:39 PM  Performed by: Omer Jack, CRNAPre-anesthesia Checklist: Patient identified, Patient being monitored, Timeout performed, Emergency Drugs available and Suction available Patient Re-evaluated:Patient Re-evaluated prior to induction Oxygen Delivery Method: Circle system utilized Preoxygenation: Pre-oxygenation with 100% oxygen Induction Type: IV induction and Rapid sequence Ventilation: Mask ventilation without difficulty Laryngoscope Size: 3 and McGrath Grade View: Grade I Tube type: Oral Tube size: 7.0 mm Number of attempts: 1 Airway Equipment and Method: Stylet and Video-laryngoscopy Placement Confirmation: ETT inserted through vocal cords under direct vision, positive ETCO2 and breath sounds checked- equal and bilateral Secured at: 21 cm Tube secured with: Tape Dental Injury: Teeth and Oropharynx as per pre-operative assessment

## 2023-08-26 NOTE — ED Notes (Signed)
 Pt went to the bathroom, notable amount of blood in toilet. New brief placed and pt back into bed.

## 2023-08-26 NOTE — Anesthesia Postprocedure Evaluation (Signed)
 Anesthesia Post Note  Patient: Suzanne Wilcox  Procedure(s) Performed: LAPAROSCOPY DIAGNOSTIC EXAM UNDER ANESTHESIA  Patient location during evaluation: PACU Anesthesia Type: General Level of consciousness: awake and alert Pain management: pain level controlled Vital Signs Assessment: post-procedure vital signs reviewed and stable Respiratory status: spontaneous breathing, nonlabored ventilation, respiratory function stable and patient connected to nasal cannula oxygen Cardiovascular status: blood pressure returned to baseline and stable Postop Assessment: no apparent nausea or vomiting Anesthetic complications: no  No notable events documented.   Last Vitals:  Vitals:   08/26/23 2011 08/26/23 2015  BP:    Pulse: 74 69  Resp: 15 17  Temp:    SpO2: 100% 98%    Last Pain:  Vitals:   08/26/23 2011  TempSrc:   PainSc: 7                  Stephanie Coup

## 2023-08-26 NOTE — ED Notes (Signed)
 Urine sent with "save tube" label to lab. Visible clotting in urine.

## 2023-08-26 NOTE — Telephone Encounter (Signed)
 She needs to be seen. Suzanne Wilcox has added her to my schedule for later this morning.

## 2023-08-26 NOTE — ED Notes (Signed)
 Rainbow of labs and nasal swab sent down with Pt sabe tube labels.

## 2023-08-26 NOTE — Discharge Instructions (Addendum)
General Gynecological Post-Operative Instructions °You may expect to feel dizzy, weak, and drowsy for as long as 24 hours after receiving the medicine that made you sleep (anesthetic).  °Do not drive a car, ride a bicycle, participate in physical activities, or take public transportation until you are done taking narcotic pain medicines or as directed by your doctor.  °Do not drink alcohol or take tranquilizers.  °Do not take medicine that has not been prescribed by your doctor.  °Do not sign important papers or make important decisions while on narcotic pain medicines.  °Have a responsible person with you.  °CARE OF INCISION  °Keep incision clean and dry. °Take showers instead of baths until your doctor gives you permission to take baths.  °Avoid heavy lifting (more than 10 pounds/4.5 kilograms), pushing, or pulling.  °Avoid activities that may risk injury to your surgical site.  °No sexual intercourse or placement of anything in the vagina for 6 weeks or as instructed by your doctor. °If you have tubes coming from the wound site, check with your doctor regarding appropriate care of the tubes. °Only take prescription or over-the-counter medicines  for pain, discomfort, or fever as directed by your doctor. Do not take aspirin. It can make you bleed. Take medicines (antibiotics) that kill germs if they are prescribed for you.  °Call the office or go to the Emergency Room if:  °You feel sick to your stomach (nauseous).  °You start to throw up (vomit).  °You have trouble eating or drinking.  °You have an oral temperature above 101.  °You have constipation that is not helped by adjusting diet or increasing fluid intake. Pain medicines are a common cause of constipation.  °You have any other concerns. °SEEK IMMEDIATE MEDICAL CARE IF:  °You have persistent dizziness.  °You have difficulty breathing or a congested sounding (croupy) cough.  °You have an oral temperature above 102.5, not controlled by medicine.  °There is  increasing pain or tenderness near or in the surgical site.  ° ° °

## 2023-08-26 NOTE — ED Triage Notes (Signed)
 Patient to ED via POV for vaginal bleeding- started last night. Had hysterectomy on 2/12. Patient states she is having to change pads multiple times an hour. Pt noted to be bleeding through clothes. States pressure in her lower back.

## 2023-08-26 NOTE — ED Notes (Signed)
 Pt has soaked 2 pads and (1) brief.

## 2023-08-26 NOTE — Anesthesia Preprocedure Evaluation (Signed)
 Anesthesia Evaluation  Patient identified by MRN, date of birth, ID band Patient awake    Reviewed: Allergy & Precautions, NPO status , Patient's Chart, lab work & pertinent test results  History of Anesthesia Complications (+) PONV and history of anesthetic complications  Airway Mallampati: III  TM Distance: >3 FB Neck ROM: full    Dental  (+) Chipped   Pulmonary former smoker   Pulmonary exam normal        Cardiovascular hypertension, negative cardio ROS Normal cardiovascular exam     Neuro/Psych  PSYCHIATRIC DISORDERS Anxiety Depression Bipolar Disorder   negative neurological ROS     GI/Hepatic Neg liver ROS,GERD  Medicated and Poorly Controlled,,  Endo/Other  negative endocrine ROS    Renal/GU      Musculoskeletal   Abdominal   Peds  Hematology  (+) Blood dyscrasia, anemia   Anesthesia Other Findings Patient with a PMH of vaginally bleeding seen in the ER. Patient's hemoglobin has dropped 1 pt in a few hours. Will proceed with emergent surgery. Patient has been NPO for 8+ hours but states she is very nauseous.   Past Medical History: No date: Allergy No date: Anemia No date: Anxiety No date: Arthritis     Comment:  both knees No date: Bipolar disorder (HCC) No date: Bipolar disorder (HCC) No date: Complication of anesthesia No date: Depression No date: Family history of adverse reaction to anesthesia     Comment:  possible MOM slow to wake up No date: Generalized headaches No date: GERD (gastroesophageal reflux disease) No date: History of hiatal hernia     Comment:  small No date: Hx of varicella No date: Hyperlipidemia No date: Hypertension No date: Lymphedema No date: Missed abortions     Comment:  X 6 04/29/2012: Obesity No date: Pneumonia     Comment:  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 03/28/2016: Postoperative retention of urine 03/27/2016: Postpartum care following cesarean delivery  (9/29) No date: Sleep apnea 03/22/2004: Termination of pregnancy (fetus)     Comment:  X 1 No date: Vaginal Pap smear, abnormal  Past Surgical History: 06/29/2009: CESAREAN SECTION     Comment:  X 1 [facility] - [place] 05/22/2014: CESAREAN SECTION; N/A     Comment:  Procedure: Repeat CESAREAN SECTION;  Surgeon: Lenoard Aden, MD;  Location: [facility];  Service: Obstetrics;                Laterality: N/A;  EDD: 05/31/14 03/27/2016: CESAREAN SECTION WITH BILATERAL TUBAL LIGATION; Bilateral     Comment:  Procedure: Repeat CESAREAN SECTION WITH BILATERAL TUBAL               LIGATION;  Surgeon: Olivia Mackie, MD;  Location: [facility]IR[facility] SU[facility];  Service: Obstetrics;  Laterality:               Bilateral;  EDD: 04/01/16 04/29/2017: CHOLECYSTECTOMY; N/A     Comment:  Procedure: LAPAROSCOPIC CHOLECYSTECTOMY WITH               INTRAOPERATIVE CHOLANGIOGRAM;  Surgeon: Ovidio Kin,               MD;  Location: [facility];  Service: General;  Laterality:               N/A;  ERAS PATHWAY 09/04/2022: COLONOSCOPY WITH PROPOFOL; N/A  Comment:  Procedure: COLONOSCOPY WITH PROPOFOL;  Surgeon: Toney Reil, MD;  Location: [facility] ENDOSCOPY;  Service:               Gastroenterology;  Laterality: N/A;  2005, 2010: DILATION AND CURETTAGE OF UTERUS     Comment:    x2 MAB 01/11/2017: ESOPHAGOGASTRODUODENOSCOPY; N/A     Comment:  Procedure: ESOPHAGOGASTRODUODENOSCOPY (EGD);  Surgeon:               Kerin Salen, MD;  Location: [facility];  Service:               Gastroenterology;  Laterality: N/A; 03/2022: GASTRIC BYPASS 03/26/2016: IR GENERIC HISTORICAL     Comment:  IR FLUORO GUIDE CV LINE RIGHT 03/26/2016 [facility]-INTERV RAD 03/26/2016: IR GENERIC HISTORICAL     Comment:  IR US GUIDE VASC ACCESS RIGHT 03/26/2016 [facility]-INTER[facility] 08/11/2023: TOTAL LAPAROSCOPIC HYSTERECTOMY WITH BILATERAL SALPINGO  OOPHORECTOMY; N/A     Comment:  Procedure: TOTAL LAPAROSCOPIC HYSTERECTOMY WITH                BILATERAL SALPINGO OOPHORECTOMY, WASHINGS;  Surgeon:               Leida Lauth, MD;  Location: [facility] ORS;  Service:               Gynecology;  Laterality: N/A; No date: WISDOM TOOTH EXTRACTION  BMI    Body Mass Index: 38.37 kg/m      Reproductive/Obstetrics negative OB ROS                             Anesthesia Physical Anesthesia Plan  ASA: 4 and emergent  Anesthesia Plan: General ETT   Post-op Pain Management:    Induction: Intravenous and Rapid sequence  PONV Risk Score and Plan: Ondansetron and Dexamethasone  Airway Management Planned: Oral ETT  Additional Equipment:   Intra-op Plan:   Post-operative Plan: Extubation in OR  Informed Consent: I have reviewed the patients History and Physical, chart, labs and discussed the procedure including the risks, benefits and alternatives for the proposed anesthesia with the patient or authorized representative who has indicated his/her understanding and acceptance.     Dental Advisory Given  Plan Discussed with: Anesthesiologist, CRNA and Surgeon  Anesthesia Plan Comments: (Patient consented for risks of anesthesia including but not limited to:  - adverse reactions to medications - damage to eyes, teeth, lips or other oral mucosa - nerve damage due to positioning  - sore throat or hoarseness - Damage to heart, brain, nerves, lungs, other parts of body or loss of life  Patient voiced understanding and assent.)       Anesthesia Quick Evaluation

## 2023-08-26 NOTE — Telephone Encounter (Signed)
 Does she need to be seen or keep monitoring? Hysterectomy was 2/12

## 2023-08-26 NOTE — ED Provider Notes (Signed)
 [facility] Provider Note    Event Date/Time   First MD Initiated Contact with Patient 08/26/23 1114     (approximate)   History   Vaginal Bleeding   HPI  Suzanne Wilcox is a 48 year old female with history of BRCA2 gene mutation, HTN presenting to the emergency department for evaluation of vaginal bleeding.  She is status post total hysterectomy on 08/11/2023.  Initially does had spotting following her surgery, but yesterday had onset of worsening vaginal bleeding.  Did have increased exertion yesterday.  Today, has had brisk vaginal bleeding having to change her pad approximately every hour.  Does report passage of clots.  Additionally reports some lower abdominal cramping.    Physical Exam   Triage Vital Signs: ED Triage Vitals  Encounter Vitals Group     BP 08/26/23 1109 (!) 133/96     Systolic BP Percentile --      Diastolic BP Percentile --      Pulse Rate 08/26/23 1109 (!) 101     Resp 08/26/23 1109 18     Temp 08/26/23 1108 97.7 F (36.5 C)     Temp Source 08/26/23 1108 Oral     SpO2 08/26/23 1109 99 %     Weight 08/26/23 1109 245 lb (111.1 kg)     Height 08/26/23 1109 5\' 7"  (1.702 m)     Head Circumference --      Peak Flow --      Pain Score 08/26/23 1108 6     Pain Loc --      Pain Education --      Exclude from Growth Chart --     Most recent vital signs: Vitals:   08/26/23 1305 08/26/23 1600  BP: 130/68 136/76  Pulse: 82 85  Resp: 18 12  Temp:    SpO2: 100% 100%     General: Awake, interactive  CV:  Regular rate, good peripheral perfusion.  Resp:  Unlabored respirations, lungs clear to auscultation Abd:  Nondistended, soft, mild lower abdominal tenderness GU:  Small blood clot noted at external vagina, thin red blood noted in diaper, internal exam deferred given recent surgery Neuro:  Symmetric facial movement, fluid speech   ED Results / Procedures / Treatments   Labs (all labs ordered are listed, but only abnormal  results are displayed) Labs Reviewed  COMPREHENSIVE METABOLIC PANEL - Abnormal; Notable for the following components:      Result Value   Sodium 132 (*)    Glucose, Bld 116 (*)    Calcium 8.5 (*)    Total Protein 6.2 (*)    All other components within normal limits  CBC - Abnormal; Notable for the following components:   HCT 35.8 (*)    All other components within normal limits  HEMOGLOBIN AND HEMATOCRIT, BLOOD  TYPE AND SCREEN     EKG EKG independently reviewed interpreted by myself (ER attending) demonstrates:  EKG demonstrates sinus rhythm rate 88, PR 146, QRS 81, QTc 429, no acute ST changes  RADIOLOGY Imaging independently reviewed and interpreted by myself demonstrates:  CT abdomen pelvis without appreciable free air, material noted posterior to the bladder, formal radiology read pending  PROCEDURES:  Critical Care performed: No  Procedures   MEDICATIONS ORDERED IN ED: Medications  ondansetron (ZOFRAN-ODT) disintegrating tablet 4 mg (4 mg Oral Given 08/26/23 1133)  morphine (PF) 4 MG/ML injection 4 mg (4 mg Intravenous Given 08/26/23 1309)  iohexol (OMNIPAQUE) 300 MG/ML solution 100 mL (100 mLs  Intravenous Contrast Given 08/26/23 1355)  morphine (PF) 4 MG/ML injection 4 mg (4 mg Intravenous Given 08/26/23 1503)  HYDROmorphone (DILAUDID) injection 0.5 mg (0.5 mg Intravenous Given 08/26/23 1601)     IMPRESSION / MDM / ASSESSMENT AND PLAN / ED COURSE  I reviewed the triage vital signs and the nursing notes.  Differential diagnosis includes, but is not limited to, cuff dehiscence, intra-abdominal abscess, other acute intra-abdominal process  Patient's presentation is most consistent with acute presentation with potential threat to life or bodily function.  48 year old female presenting with increasing vaginal bleeding after recent hysterectomy.  Mild tachycardia on presentation, improved on reevaluation.   Clinical Course as of 08/26/23 1604  Thu Aug 26, 2023  1232  CNM G[place] with [facility] paged x2. Dr. Logan Bores paged and requested the CNM continued be attempted to contact. Secure chat sent.  [NR]  1352 CT abdomen pelvis ordered while awaiting callback.  GYN midwife repaged. [NR]  1410 Notified by secretary that incorrect CNM listed on call, correct CNM paged.  [NR]  1422 Received callback from nurse midwife. She was sitting with Dr. Valentino Saxon, so case was reviewed with Dr. Valentino Saxon who will come perform a bedside evaluation. [NR]  1555 Patient reassessed.  She has been packed by Dr. Valentino Saxon who recommended Foley placement, discharged with plans for reevaluation in clinic tomorrow.  Foley catheter placed and patient ordered for pain medication.  Initial plan for discharge, but notified by RN that patient's packing was saturated, oncoming physician will contact GYN team to clarify discharge plan.  [NR]    Clinical Course User Index [NR] Trinna Post, MD     FINAL CLINICAL IMPRESSION(S) / ED DIAGNOSES   Final diagnoses:  Postoperative vaginal bleeding following genitourinary procedure     Rx / DC Orders   ED Discharge Orders     None        Note:  This document was prepared using Dragon voice recognition software and may include unintentional dictation errors.   Trinna Post, MD 08/26/23 [phone]

## 2023-08-26 NOTE — Transfer of Care (Signed)
 Immediate Anesthesia Transfer of Care Note  Patient: Suzanne Wilcox  Procedure(s) Performed: LAPAROSCOPY DIAGNOSTIC EXAM UNDER ANESTHESIA  Patient Location: PACU  Anesthesia Type:General  Level of Consciousness: awake, alert , and oriented  Airway & Oxygen Therapy: Patient Spontanous Breathing  Post-op Assessment: Report given to RN and Post -op Vital signs reviewed and stable  Post vital signs: Reviewed and stable  Last Vitals:  Vitals Value Taken Time  BP 120/81 08/26/23 1930  Temp 36 C 08/26/23 1922  Pulse 69 08/26/23 1934  Resp 21 08/26/23 1934  SpO2 100 % 08/26/23 1934  Vitals shown include unfiled device data.  Last Pain:  Vitals:   08/26/23 1922  TempSrc:   PainSc: Asleep         Complications: No notable events documented.

## 2023-08-26 NOTE — Op Note (Signed)
 Procedure(s): LAPAROSCOPY DIAGNOSTIC EXAM UNDER ANESTHESIA Procedure Note  Suzanne Wilcox female 48 y.o. 08/26/2023  Indications: The patient is a 48 y.o. [id number] female who is 2 weeks s/p total laparoscopic hysterectomy with bilateral salpingo-oophorectomy who presented to the ER secondary to heavy vaginal bleeding.   Attempted evacuation of vaginal clots with subsequent vaginal packing which was unsuccessful due to persistent bleeding. Recent CT scan noting 8 x 6 hematoma.   Pre-operative Diagnosis: Post-operative pelvic hematoma 2 weeks s/p hysterectomy  Post-operative Diagnosis: Same, with resolved hematoma  Surgeon: Hildred Laser, MD  Assistants:  Surgical scrub tech.   Anesthesia: General endotracheal anesthesia  Findings: - Exam under anesthesia noting moderate vaginal clots. After evacuation of clots, further exam noted intact vaginal cuff with visible sutures. No active bleeding.  - Laparoscopy with few adhesions of omentum to the vaginal cuff. No obvious hematoma visualized in the pelvis.    Procedure Details: The patient was seen in the Holding Room. The risks, benefits, complications, treatment options, and expected outcomes were discussed with the patient.  The patient concurred with the proposed plan, giving informed consent.  The site of surgery properly noted/marked. The patient was taken to the Operating Room, identified as Suzanne Wilcox and the procedure verified as Procedure(s) (LRB): LAPAROSCOPY DIAGNOSTIC (N/A), EXAM UNDER ANESTHESIA (N/A). A Time Out was held and the above information confirmed.  She was then placed under general anesthesia without difficulty. She was placed in the dorsal lithotomy position, and was prepped and draped in a sterile manner. A foley catheter was already previously in place from ER evaluation.   After an adequate timeout was performed, a sterile speculum was inserted into the vagina and a moderate amount of clots and blood were  evacuated.  On further evaluation, the vaginal cuff was examined and appeared to be intact with stitches visualized.  The speculum was then removed and a sponge stick was placed into the vagina for cuff manipulation.  attention was turned to the abdomen where the previous supraumbilical incision was re-entered with the scalpel. The Veress needle was passed and a pneumoperitoneum was established.  The Veress needle was then removed and an 11- mm port was placed supraumbilically under direct visualization into the abdomen. Bilateral  5-mm lower quadrant ports were then placed under direct visualization.  A survey of the patient's pelvis and abdomen revealed the findings as above.  No evidence of hematoma noted in the pelvis. Few thick adhesions of the omentum to the vaginal cuff. These adhesions were lysed using the Harmonic scalpel.  Initially no obvious source of bleeding noted, however after longer observation, very slow oozing noted at right edge of vaginal cuff near recent adhesiolysis. This area was cauterized.  Arista hemostatic agent was then placed over the vaginal cuff. Good hemostasis was observed throughout.  Normal upper abdomen visualized. The abdomen was desufflated and all trochars were removed under direct visualization. The fascia of the 11-mm incision was then grasped with Alice clamps and approximated using 0-Vicryl in a figure-of-eight fashion.  All other skin incisions were closed with 4-0 Vicryl subcuticular stitches. The incisions were injected with a total of 40 ml of Exparel solution (30 ml of 0.5% Marcine mixed with 20 ml of Exparel).  The patient tolerated the procedures well.  All instruments, needles, and sponge counts were correct x 2. The patient was taken to the recovery room awake, extubated and in stable condition.    Estimated Blood Loss:  ~ 200 ml in blood clots evacuated  from vagina. No other active blood loss encountered.       Drains: foley catheterization in place with   300 ml of clear urine         Total IV Fluids:   740 ml  Specimens: None         Implants: None         Complications:  None; patient tolerated the procedure well.         Disposition: PACU - hemodynamically stable.         Condition: stable   Hildred Laser, MD [facility]

## 2023-08-26 NOTE — ED Provider Notes (Signed)
-----------------------------------------   4:52 PM on 08/26/2023 ----------------------------------------- Patient care assumed from Dr. Rosalia Hammers.  Shortly after I assumed care around 4 PM patient was complaining of increased bleeding sensation after her gauze packing by Dr. Valentino Saxon.  I evaluated the patient she had blood soaked gauze within the vaginal canal which was left in place there was a decent amount of blood also on the adult brief which was replaced.  A repeat H&H was ordered by myself.  Patient began feeling lightheaded and dizzy I evaluated the patient she had moderate diaphoresis and felt nauseated blood pressure dropped to around 100 systolic from around 130.  Suspect more of a vagal event.  Repeat hemoglobin does show a mild drop to 10.9 from 12.0.  Given the ongoing bleeding I discussed with Dr. Valentino Saxon.  Radiology has now read the CT scan as an 8 x 6 cm hematoma with signs of active extravasation.  Dr. Valentino Saxon will be taking to the OR.  Patient agreeable to plan.  She has remained n.p.o. in the emergency department.  I ordered a liter of fluid in addition to the Zofran.  Patient's blood pressure is already improving back to 128 systolic.   Minna Antis, MD 08/26/23 1655

## 2023-08-27 ENCOUNTER — Ambulatory Visit: Payer: 59 | Admitting: Obstetrics and Gynecology

## 2023-08-27 ENCOUNTER — Other Ambulatory Visit: Payer: Self-pay

## 2023-08-27 ENCOUNTER — Encounter: Payer: Self-pay | Admitting: Obstetrics and Gynecology

## 2023-08-27 ENCOUNTER — Telehealth: Payer: Self-pay

## 2023-08-27 NOTE — Telephone Encounter (Signed)
 Pt called reporting she had surgery yesterday. Her resting heart rate is 100 and BP is 106/57. Pt advised to drink plenty of fluids and rest. This could be due to pain medicine and not enough fluids or it could be related to blood loss. Pt aware if it does not get better with rest and hydration then go to the ER. Per Shanda Bumps CNM give it at least 2 hours.

## 2023-09-02 NOTE — Progress Notes (Signed)
    OBSTETRICS/GYNECOLOGY POST-OPERATIVE CLINIC VISIT  Subjective:     Suzanne Wilcox is a 48 y.o. female who presents to the clinic 1 weeks status post LAPAROSCOPY DIAGNOSTIC EXAM UNDER ANESTHESIA  for  Post-operative vaginal bleeding and Pelvic hematoma . She had previously undergone laparoscopic TLH with BSO and pelvic washings 3 weeks ago.  Eating a regular diet with mild difficulty, has decreased appetite.  Bowel movements are abnormal with constipation . Pain is controlled with current analgesics. Medications being used: acetaminophen, ibuprofen (OTC), and narcotic analgesics including oxycodone/acetaminophen (Percocet, Tylox). She has been having some elevated blood pressure readings. She feels dizzy and nauseous. She has some spotting as well.   The following portions of the patient's history were reviewed and updated as appropriate: allergies, current medications, past family history, past medical history, past social history, past surgical history, and problem list.  Review of Systems Pertinent items noted in HPI and remainder of comprehensive ROS otherwise negative.   Objective:   BP 132/69   Pulse 91   Resp 16   Ht 5' 7.5" (1.715 m)   Wt 249 lb 11.2 oz (113.3 kg)   BMI 38.53 kg/m  Body mass index is 38.53 kg/m.  General:  alert and no distress  Abdomen: soft, bowel sounds active, non-tender  Incision:   healing well, no drainage, no erythema, no hernia, no seroma, no swelling, no dehiscence, incision well approximated    Pathology:  No new pathology   Labs:  Lab Results  Component Value Date   WBC 7.3 08/26/2023   HGB 10.9 (L) 08/26/2023   HCT 32.6 (L) 08/26/2023   MCV 92.5 08/26/2023   PLT 287 08/26/2023     Assessment:   Patient s/p LAPAROSCOPY DIAGNOSTIC EXAM UNDER ANESTHESIA   Recently s/p Laparoscopic TLH with BSO and pelvic washings   Plan:   1. Continue any current medications as instructed by provider. Can utilize topical lidocaine gel/patch to  supraumbilical incision site which seems to be causing the most discomfort. Advisedon Colace/Miralax for constipation if needed.  2. Wound care discussed. 3. Advised on small meals/snacks if needed instead of attempting larger meals. Also continue to encourage adequate hydration as patient notes she has not been drinking much.  4.Will check Hgb in light of patient's recent bleeding episode and symptoms of nausea and dizziness. 5. Activity restrictions: no bending, stooping, or squatting, no lifting more than 15 pounds, no overhead lifting, and pelvic rest 6. Anticipated return to work:  3-4 weeks . 7.  Follow up: 3 weeks with GYN Oncology.     Hildred Laser, MD [facility]

## 2023-09-03 ENCOUNTER — Ambulatory Visit (INDEPENDENT_AMBULATORY_CARE_PROVIDER_SITE_OTHER): Payer: 59 | Admitting: Obstetrics and Gynecology

## 2023-09-03 ENCOUNTER — Encounter: Payer: Self-pay | Admitting: Obstetrics and Gynecology

## 2023-09-03 VITALS — BP 132/69 | HR 91 | Resp 16 | Ht 67.5 in | Wt 249.7 lb

## 2023-09-03 DIAGNOSIS — Z4889 Encounter for other specified surgical aftercare: Secondary | ICD-10-CM | POA: Diagnosis not present

## 2023-09-03 DIAGNOSIS — D62 Acute posthemorrhagic anemia: Secondary | ICD-10-CM

## 2023-09-04 ENCOUNTER — Encounter: Payer: Self-pay | Admitting: Obstetrics and Gynecology

## 2023-09-04 LAB — HEMOGLOBIN AND HEMATOCRIT, BLOOD
Hematocrit: 31 % — ABNORMAL LOW (ref 34.0–46.6)
Hemoglobin: 9.9 g/dL — ABNORMAL LOW (ref 11.1–15.9)

## 2023-09-07 ENCOUNTER — Ambulatory Visit: Payer: 59 | Admitting: Plastic Surgery

## 2023-09-07 ENCOUNTER — Telehealth: Payer: Self-pay | Admitting: Plastic Surgery

## 2023-09-07 DIAGNOSIS — Z7189 Other specified counseling: Secondary | ICD-10-CM

## 2023-09-07 DIAGNOSIS — Z1501 Genetic susceptibility to malignant neoplasm of breast: Secondary | ICD-10-CM

## 2023-09-07 DIAGNOSIS — Z1509 Genetic susceptibility to other malignant neoplasm: Secondary | ICD-10-CM

## 2023-09-07 NOTE — Progress Notes (Signed)
   Subjective:    Patient ID: Suzanne Wilcox, female    DOB: 07/22/1975, 48 y.o.   MRN: [id number]  The patient is a 48 year old female joining me by phone for further discussion about her breast reconstruction.  About a month ago she had GYN surgery and had a hemorrhaging episode 2 weeks later.  She had emergency surgery for the repair.  She is doing better now but is a little shaken from the experience.  She is 5 feet 7 inches tall weighs around 248 pounds which is down from 396 pounds.  As a reminder she quit smoking 15 years ago and is a J/H bra size.  She was found to have BRCA2 positive gene.  She is clear right now with a clear MRI.  She does see Dr. Orlie Dakin and she does want to have bilateral mastectomies.  Her past medical history is positive for anxiety, bipolar, depression, headaches, reflux, sleep apnea, hyperlipidemia and hypertension.  Besides a gastric bypass she is also had a cholecystectomy, hysterectomy and wisdom teeth removed.      Review of Systems  Constitutional: Negative.   Eyes: Negative.   Respiratory: Negative.    Cardiovascular: Negative.        Objective:   Physical Exam      Assessment & Plan:     ICD-10-CM   1. BRCA2 gene mutation positive  Z15.01    Z15.09       I connected with  Suzanne Wilcox on 09/07/23 by phone and verified that I am speaking with the correct person using two identifiers. We spent 5 minutes in discussion.  The patient was at home and I was at the office.   I discussed the limitations of evaluation and management by telemedicine. The patient expressed understanding and agreed to proceed.  I have requested the staff to send her information to [facility] again and let see if we can get her a consultation.

## 2023-09-07 NOTE — Telephone Encounter (Signed)
 Delivery History  Faxed to [phone]  [email or web address]  Mikki Harbor on 09/07/2023 1525 - delivered at 09/07/2023 1525    Faxed to [phone]  [email or web address]  Mikki Harbor on 08/23/2023 1244 - delivered at 08/23/2023 1245

## 2023-09-16 ENCOUNTER — Encounter: Payer: Self-pay | Admitting: Surgery

## 2023-09-16 ENCOUNTER — Encounter: Payer: Self-pay | Admitting: Obstetrics and Gynecology

## 2023-09-16 ENCOUNTER — Ambulatory Visit (INDEPENDENT_AMBULATORY_CARE_PROVIDER_SITE_OTHER): Admitting: Surgery

## 2023-09-16 VITALS — BP 142/86 | HR 96 | Temp 98.5°F | Ht 67.5 in | Wt 241.6 lb

## 2023-09-16 DIAGNOSIS — Z9049 Acquired absence of other specified parts of digestive tract: Secondary | ICD-10-CM

## 2023-09-16 DIAGNOSIS — Z1501 Genetic susceptibility to malignant neoplasm of breast: Secondary | ICD-10-CM | POA: Diagnosis not present

## 2023-09-16 DIAGNOSIS — Z4001 Encounter for prophylactic removal of breast: Secondary | ICD-10-CM

## 2023-09-16 NOTE — Telephone Encounter (Signed)
 Spoke with patient to offer same day incision check. At this time she states after using neosporin she has noticed improvements near the incision. Reports she is going to see a general surgeon today and will ask if they can check it as well and patient also was post-op follow-up next week. Advised to call if she notices anything worsening, discharge or fevers.

## 2023-09-16 NOTE — Progress Notes (Signed)
 Patient ID: Suzanne Wilcox, female   DOB: March 08, 1976, 48 y.o.   MRN: [id number]  Chief Complaint: BRCA2 positivity, desiring prophylactic mastectomies  History of Present Illness Suzanne Wilcox is a 48 y.o. female with an interest in prophylactic mastectomies, initially sent by oncology to plastic surgeon Dr. Ulice Bold.  She reports she has an interest in avoiding implants and desiring tissue transfer.  And a referral has been made to the [facility] plastic surgeons.  About a month ago she had GYN surgery and had a hemorrhaging episode later. She had emergency surgery for that additional repair. She is doing better now but is a little shaken from the experience.  She was found to have BRCA2 positive gene. She is clear right now with a negative breast MRI. She does see Dr. Orlie Dakin and she does want to have bilateral nonnipple sparing mastectomies.   Her past medical history is positive for anxiety, bipolar, depression, headaches, reflux, sleep apnea, hyperlipidemia and hypertension. Besides a gastric bypass she is also had a cholecystectomy, hysterectomy and wisdom teeth removed.  She has had no prior breast biopsies, she took birth control, she has had a recent hysterectomy.  She denies any family history of breast cancer.  She began menstruating at the age of 67.  She is gravida 10 para 3 with 6 miscarriages and 1 abortion.  She is 48 years old at the her first pregnancy, which did not come to term.  She has children aged 66, 55, 36 at this time. She has lost a significant amount of weight following her gastric bypass, and has had no breast issues.  She does have a tattoo in her upper medial left breast.  She denies any other skin changes or breast pain, as well  any nipple discharge, or palpable masses or nodularity.   Past Medical History Past Medical History:  Diagnosis Date   Allergy    Anemia    Anxiety    Arthritis    both knees   Bipolar disorder (HCC)    Bipolar disorder (HCC)    Complication  of anesthesia    Depression    Family history of adverse reaction to anesthesia    possible MOM slow to wake up   Generalized headaches    GERD (gastroesophageal reflux disease)    History of hiatal hernia    small   Hx of varicella    Hyperlipidemia    Hypertension    Lymphedema    Missed abortions    X 6   Obesity 04/29/2012   Pneumonia    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   Postoperative retention of urine 03/28/2016   Postpartum care following cesarean delivery (9/29) 03/27/2016   Sleep apnea    Termination of pregnancy (fetus) 03/22/2004   X 1   Vaginal Pap smear, abnormal       Past Surgical History:  Procedure Laterality Date   CESAREAN SECTION  06/29/2009   X 1 [facility] [place]   CESAREAN SECTION N/A 05/22/2014   Procedure: Repeat CESAREAN SECTION;  Surgeon: Lenoard Aden, MD;  Location: [facility] ORS;  Service: Obstetrics;  Laterality: N/A;  EDD: 05/31/14   CESAREAN SECTION WITH BILATERAL TUBAL LIGATION Bilateral 03/27/2016   Procedure: Repeat CESAREAN SECTION WITH BILATERAL TUBAL LIGATION;  Surgeon: Olivia Mackie, MD;  Location: [facility]IR[facility] SU[facility];  Service: Obstetrics;  Laterality: Bilateral;  EDD: 04/01/16   CHOLECYSTECTOMY N/A 04/29/2017   Procedure: LAPAROSCOPIC CHOLECYSTECTOMY WITH INTRAOPERATIVE CHOLANGIOGRAM;  Surgeon: Ovidio Kin, MD;  Location: [facility]  ORS;  Service: General;  Laterality: N/A;  ERAS PATHWAY   COLONOSCOPY WITH PROPOFOL N/A 09/04/2022   Procedure: COLONOSCOPY WITH PROPOFOL;  Surgeon: Toney Reil, MD;  Location: [facility] ENDOSCOPY;  Service: Gastroenterology;  Laterality: N/A;   DILATION AND CURETTAGE OF UTERUS   2005, 2010     x2 M[facility]   ESOPHAGOGASTRODUODENOSCOPY N/A 01/11/2017   Procedure: ESOPHAGOGASTRODUODENOSCOPY (EGD);  Surgeon: Kerin Salen, MD;  Location: [facility]COPY;  Service: Gastroenterology;  Laterality: N/A;   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   IR GENERIC HISTORICAL  03/26/2016   IR FLUORO GUIDE CV LINE RIGHT 03/26/2016 [facility]INTER[facility]   IR GENERIC  HISTORICAL  03/26/2016   IR US GUIDE VASC ACCESS RIGHT 03/26/2016 [facility]   LAPAROSCOPY N/A 08/26/2023   Procedure: LAPAROSCOPY DIAGNOSTIC;  Surgeon: Hildred Laser, MD;  Location: [facility] ORS;  Service: Gynecology;  Laterality: N/A;   TOTAL LAPAROSCOPIC HYSTERECTOMY WITH BILATERAL SALPINGO OOPHORECTOMY N/A 08/11/2023   Procedure: TOTAL LAPAROSCOPIC HYSTERECTOMY WITH BILATERAL SALPINGO OOPHORECTOMY, WASHINGS;  Surgeon: Leida Lauth, MD;  Location: [facility] ORS;  Service: Gynecology;  Laterality: N/A;   WISDOM TOOTH EXTRACTION      Allergies  Allergen Reactions   Wound Dressing Adhesive Itching, Other (See Comments) and Swelling   Wound Dressings Itching, Other (See Comments) and Swelling   Adhesive [Tape] Itching and Swelling    If left on for prolonged periods of time    Lyrica [Pregabalin] Photosensitivity    Current Outpatient Medications  Medication Sig Dispense Refill   acetaminophen (TYLENOL) 500 MG tablet Take 1 tablet (500 mg total) by mouth every 6 (six) hours as needed. 30 tablet 0   acetaminophen-caffeine (EXCEDRIN TENSION HEADACHE) 500-65 MG TABS per tablet Take 2 tablets by mouth daily. May take another 2 tablet dose throughout the day as needed for a headache     ALPRAZolam (XANAX) 1 MG tablet Take 1 mg by mouth 3 (three) times daily as needed for anxiety.     amLODipine (NORVASC) 5 MG tablet TAKE 1 TABLET(5 MG) BY MOUTH DAILY 90 tablet 1   amphetamine-dextroamphetamine (ADDERALL) 30 MG tablet Take 30 mg by mouth daily. May take a second 30 mg dose in the afternoon as needed for attention     buPROPion (WELLBUTRIN) 100 MG tablet Take 100-200 mg by mouth See admin instructions. Take 200 mg in the morning and 100 mg in the evening     Calcium Citrate-Vitamin D (CALCIUM CITRATE CHEWY BITE PO) Take 1 tablet by mouth 3 (three) times daily.     CAPLYTA 42 MG capsule Take 42 mg by mouth at bedtime.     Coenzyme Q10 (COQ-10 PO) Take 1 tablet by mouth daily.     docusate sodium  (COLACE) 100 MG capsule Take 1 capsule (100 mg total) by mouth 2 (two) times daily as needed. 30 capsule 2   estradiol (CLIMARA) 0.05 mg/24hr patch Place 1 patch (0.05 mg total) onto the skin once a week. 12 patch 3   Iron-Vitamin C (VITRON-C) 65-125 MG TABS Take 1 tablet by mouth 2 (two) times daily.     magic mouthwash w/lidocaine SOLN Swish, gargle, and spit one to two teaspoonfuls every six hours as needed. Shake well before using. 120 mL 0   MAGNESIUM-POTASSIUM PO Take 1 tablet by mouth daily.     Multiple Vitamins-Minerals (BARIATRIC MULTIVITAMINS/IRON) CAPS Take 1 capsule by mouth daily.     nystatin (MYCOSTATIN/NYSTOP) powder Apply 1 Application topically 2 (two) times daily as needed (rash).  nystatin cream (MYCOSTATIN) Apply 1 Application topically 2 (two) times daily as needed (rash).     Omega-3 Fatty Acids (FISH OIL PO) Take 1 capsule by mouth daily.     ondansetron (ZOFRAN) 8 MG tablet Take 8 mg by mouth every 8 (eight) hours as needed for nausea or vomiting. Pt has not started medication yet     oxyCODONE (ROXICODONE) 5 MG immediate release tablet Take 1-2 tablets (5-10 mg total) by mouth every 6 (six) hours as needed for severe pain (pain score 7-10). 20 tablet 0   pantoprazole (PROTONIX) 40 MG tablet Take 1 tablet (40 mg total) by mouth 2 (two) times daily before a meal. 60 tablet 2   Phenylephrine-Acetaminophen (TYLENOL SINUS CONGESTION/PAIN PO) Take 2 tablets by mouth daily as needed (congestion).     No current facility-administered medications for this visit.    Family History Family History  Problem Relation Age of Onset   Depression Mother    Skin cancer Mother    HIV/AIDS Father    Skin cancer Sister    Depression Paternal Grandmother    Breast cancer Neg Hx       Social History Social History   Tobacco Use   Smoking status: Former    Current packs/day: 0.00    Average packs/day: 1 pack/day for 18.0 years (18.0 ttl pk-yrs)    Types: Cigarettes    Start  date: 04/30/1991    Quit date: 04/29/2009    Years since quitting: 14.3   Smokeless tobacco: Never  Vaping Use   Vaping status: Never Used  Substance Use Topics   Alcohol use: Yes    Comment: OCCASIONALLY   Drug use: No        Review of Systems  Constitutional:  Positive for malaise/fatigue.  HENT:  Positive for tinnitus.   Eyes: Negative.   Respiratory: Negative.    Cardiovascular:  Negative for chest pain and palpitations.  Gastrointestinal:  Positive for constipation.  Genitourinary: Negative.   Skin: Negative.   Neurological:  Positive for dizziness and headaches.  Psychiatric/Behavioral: Negative.       Physical Exam Blood pressure (!) 142/86, pulse 96, temperature 98.5 F (36.9 C), temperature source Oral, height 5' 7.5" (1.715 m), weight 241 lb 9.6 oz (109.6 kg), SpO2 99%. Last Weight  Most recent update: 09/16/2023 11:25 AM    Weight  109.6 kg (241 lb 9.6 oz)             CONSTITUTIONAL: Well developed, and nourished, appropriately responsive and aware without distress.   EYES: Sclera non-icteric.   EARS, NOSE, MOUTH AND THROAT: The oropharynx is clear. Oral mucosa is pink and moist.    Hearing is intact to voice.  NECK: Trachea is midline, and there is no jugular venous distension.  LYMPH NODES:  Lymph nodes in the neck are not appreciated. RESPIRATORY:   Normal respiratory effort without pathologic use of accessory muscles. CARDIOVASCULAR:  Well perfused.  GI: The abdomen is  soft, nontender, and nondistended.  She has a slightly tender periumbilical incision which has no erythema, it is clean dry and intact.  There were no other palpable masses.  GU: Marylene Land present as chaperone.  Pendulous ptotic breasts bilaterally, without suspicious masses, nodularity or remarkable asymmetry.  There is a sizable tattoo in the upper medial left breast. MUSCULOSKELETAL:  Symmetrical muscle tone appreciated in all four extremities.    SKIN: Skin turgor is normal. No pathologic  skin lesions appreciated.  NEUROLOGIC:  Motor and sensation appear  grossly normal.  Cranial nerves are grossly without defect. PSYCH:  Alert and oriented to person, place and time. Affect is appropriate for situation.  Data Reviewed I have personally reviewed what is currently available of the patient's imaging, recent labs and medical records.   Labs:     Latest Ref Rng & Units 09/03/2023   11:51 AM 08/26/2023    4:08 PM 08/26/2023   11:12 AM  CBC  WBC 4.0 - 10.5 K/uL   7.3   Hemoglobin 11.1 - 15.9 g/dL 9.9  65.7  84.6   Hematocrit 34.0 - 46.6 % 31.0  32.6  35.8   Platelets 150 - 400 K/uL   287       Latest Ref Rng & Units 08/26/2023   11:12 AM 07/02/2023    9:14 AM 01/04/2023   10:55 AM  CMP  Glucose 70 - 99 mg/dL 962  90  952   BUN 6 - 20 mg/dL 18  15  17    Creatinine 0.44 - 1.00 mg/dL 8.41  3.24  4.01   Sodium 135 - 145 mmol/L 132  141  139   Potassium 3.5 - 5.1 mmol/L 3.7  4.7  4.2   Chloride 98 - 111 mmol/L 100  105  104   CO2 22 - 32 mmol/L 24  28  29    Calcium 8.9 - 10.3 mg/dL 8.5  9.2  9.5   Total Protein 6.5 - 8.1 g/dL 6.2  6.2    Total Bilirubin 0.0 - 1.2 mg/dL 0.6  0.4    Alkaline Phos 38 - 126 U/L 51     AST 15 - 41 U/L 25  18    ALT 0 - 44 U/L 19  17     Imaging: Radiological images reviewed:  CLINICAL DATA:  High-risk screening breast MRI; history of BRCA 2 gene mutation.   Baseline breast MRI. Most recent screening mammogram was performed 01/13/2022 and was negative.   Technologist indicates best positioning/images possible.   EXAM: BILATERAL BREAST MRI WITH AND WITHOUT CONTRAST   TECHNIQUE: Multiplanar, multisequence MR images of both breasts were obtained prior to and following the intravenous administration of 10 ml of Gadavist   Three-dimensional MR images were rendered by post-processing of the original MR data on an independent workstation. The three-dimensional MR images were interpreted, and findings are reported in the following complete MRI  report for this study. Three dimensional images were evaluated at the independent interpreting workstation using the DynaCAD thin client.   COMPARISON:  Previous exam(s).   FINDINGS: Breast composition: b. Scattered fibroglandular tissue.   Background parenchymal enhancement: Minimal   Right breast: No suspicious mass or abnormal enhancement.   Left breast: No suspicious mass or abnormal enhancement.   Lymph nodes: No abnormal appearing lymph nodes.   Ancillary findings:  None.   IMPRESSION: No MRI findings of malignancy.   RECOMMENDATION: Annual screening mammogram, due now.   Annual screening breast MRI in 1 year, per high risk protocol.   BI-RADS CATEGORY  1: Negative.     Electronically Signed   By: Sherron Ales M.D.   On: 06/29/2023 11:16 Within last 24 hrs: No results found.  Assessment    Desire for prophylactic mastectomy Patient Active Problem List   Diagnosis Date Noted   BRCA2 gene mutation positive 06/11/2023   Chronic neck pain 04/02/2023   Lymphedema 03/02/2023   Varicose veins of leg with pain, right 03/02/2023   Encounter for screening colonoscopy 09/04/2022   Essential  hypertension 10/29/2021   Bilateral lower extremity edema 10/29/2021   Class 3 severe obesity due to excess calories without serious comorbidity with body mass index (BMI) of 50.0 to 59.9 in adult (HCC) 10/29/2021   OSA (obstructive sleep apnea) 10/29/2021   Attention deficit hyperactivity disorder (ADHD), combined type 10/29/2021   GAD (generalized anxiety disorder) 10/29/2021   Excessive daytime sleepiness 12/16/2020   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   Herniated cervical intervertebral disc 06/28/2017   Fatty (change of) liver, not elsewhere classified 12/27/2016   Gallbladder disease 04/30/2015   Bipolar 1 disorder (HCC) 05/11/2012   OCD (obsessive compulsive disorder) 05/11/2012   Substance abuse (HCC) 05/11/2012    Plan    Although I do not have privileges at  [facility], and I suspect the physicians that do tissue transfer breast reconstruction at [facility] are not in the habit of coming to [facility], I will remain readily available to assist this patient with bilateral mastectomies when the reconstruction plan has been established.  I anticipate seeing her again then as needed.  Happy to discuss with her future breast reconstructive surgeon.    Face-to-face time spent with the patient and accompanying care providers(if present) was 40 minutes, spent counseling, educating, and coordinating care of the patient.    These notes generated with voice recognition software. I apologize for typographical errors.  Campbell Lerner M.D., FACS 09/16/2023, 12:09 PM

## 2023-09-16 NOTE — Patient Instructions (Addendum)
 Total or Modified Radical Mastectomy A total mastectomy and a modified radical mastectomy are surgeries that are done as part of treatment for breast cancer. Both types involve removing a breast. In a total mastectomy (simple mastectomy), all breast tissue including the nipple will be removed. In a modified radical mastectomy, lymph nodes under the arm will be removed along with the breast and nipple. Some of the lining over the muscle tissues under the breast may also be removed. These procedures may also be used to help prevent breast cancer. A preventive (prophylactic) mastectomy may be done if you are at an increased risk of breast cancer due to harmful changes (mutations) in certain genes, such as the BRCA genes. In that case, the procedure involves removing both of your breasts. This can reduce your risk of developing breast cancer in the future. For a transgender person, a total mastectomy may be done as part of a surgical transition from female to female. Let your health care provider know about: Any allergies you have. All medicines you are taking, including vitamins, herbs, eye drops, creams, and over-the-counter medicines. Any problems you or family members have had with anesthetic medicines. Any bleeding problems you have. Any surgeries you have had. Any medical conditions you have. Whether you are pregnant or may be pregnant. What are the risks? Generally, this is a safe procedure. However, problems may occur, including: Infection. Bleeding. Allergic reactions to medicines. Scar tissue. Chest numbness, sensation of throbbing, or tingling on the side of the surgery. Fluid buildup under the skin flaps where your breast was removed (seroma). Stress or sadness from losing your breast. If you have the lymph nodes under your arm removed, you may have arm swelling, weakness, or numbness on the same side of your body as your surgery. What happens before the procedure? When to stop eating  and drinking Follow instructions from your health care provider about what you may eat and drink before your procedure. These may include: 8 hours before your procedure Stop eating most foods. Do not eat meat, fried foods, or fatty foods. Eat only light foods, such as toast or crackers. All liquids are okay except energy drinks and alcohol. 6 hours before your procedure Stop eating. Drink only clear liquids, such as water, clear fruit juice, black coffee, plain tea, and sports drinks. Do not drink energy drinks or alcohol. 2 hours before your procedure Stop drinking all liquids. You may be allowed to take medicines with small sips of water. If you do not follow your health care provider's instructions, your procedure may be delayed or canceled. Medicines Ask your health care provider about: Changing or stopping your regular medicines. This is especially important if you are taking diabetes medicines or blood thinners. Taking medicines such as aspirin and ibuprofen. These medicines can thin your blood. Do not take these medicines unless your health care provider tells you to take them. Taking over-the-counter medicines, vitamins, herbs, and supplements. General instructions You may be checked for extra fluid around your lymph nodes (lymphedema). Do not use any products that contain nicotine or tobacco before the procedure. These products include cigarettes, chewing tobacco, and vaping devices, such as e-cigarettes. If you need help quitting, ask your health care provider. Ask your health care provider about: How your surgery site will be marked. What steps will be taken to help prevent infection. These steps may include: Removing hair at the surgery site. Washing skin with a germ-killing soap. Taking antibiotic medicine. What happens during the procedure? An  IV will be inserted into one of your veins. You will be given: A medicine to help you relax (sedative). A medicine to make you  fall asleep (general anesthetic). A wide incision will be made around your nipple. The skin of the breast and the nipple inside the incision will be removed along with all breast tissue. Lymph nodes under the arm on the side of the tumor will be checked to see if the cancer has spread. If you are having a modified radical mastectomy: The lining over your chest muscles will be removed. The incision may be extended to reach the lymph nodes under your arm, or a second incision may be made. Lymph nodes will be removed. Breast tissue and lymph nodes that are removed will be sent to the lab for testing. You may have a drainage tube inserted into your incision to collect fluid that builds up after surgery. This tube will be connected to a suction bulb on the outside of your body to remove the fluid. Your incision or incisions will be closed with stitches (sutures), skin glue, or adhesive strips. A bandage (dressing) will be placed over your breast area. If lymph nodes were removed, a dressing will also be placed under your arm. The procedure may vary among health care providers and hospitals. What happens after the procedure? Your blood pressure, heart rate, breathing rate, and blood oxygen level will be monitored until you leave the hospital or clinic. You will be given pain medicine as needed. Your IV can be removed when you are able to eat and drink. You may have a drainage tube in place for 2-3 days to prevent a collection of blood (hematoma) from developing in the breast area. You will be given instructions about caring for the drain before you go home. A pressure bandage may be applied for 1-2 days to prevent bleeding or swelling. Ask your health care provider how to care for your pressure bandage at home. Summary In a total mastectomy (simple mastectomy), all breast tissue including the nipple will be removed. In a modified radical mastectomy, lymph nodes under the arm will be removed along with  the breast and nipple, and the chest wall lining. Before the procedure, follow instructions from your health care provider about eating and drinking, and ask about changing or stopping your regular medicines. You may have a drainage tube inserted into your incision to collect fluid that builds up after surgery. This tube will be connected to a suction bulb on the outside of your body to remove the fluid. This information is not intended to replace advice given to you by your health care provider. Make sure you discuss any questions you have with your health care provider. Document Revised: 03/16/2021 Document Reviewed: 03/16/2021 Elsevier Patient Education  2024 [employer].

## 2023-09-20 ENCOUNTER — Other Ambulatory Visit: Payer: Self-pay

## 2023-09-20 ENCOUNTER — Encounter: Payer: Self-pay | Admitting: Family Medicine

## 2023-09-20 MED ORDER — PANTOPRAZOLE SODIUM 40 MG PO TBEC: 40.0000 mg | DELAYED_RELEASE_TABLET | Freq: Two times a day (BID) | ORAL | 2 refills | Status: DC

## 2023-09-22 ENCOUNTER — Encounter: Payer: Self-pay | Admitting: Obstetrics and Gynecology

## 2023-09-22 ENCOUNTER — Inpatient Hospital Stay: Payer: 59 | Attending: Oncology | Admitting: Obstetrics and Gynecology

## 2023-09-22 ENCOUNTER — Ambulatory Visit: Payer: 59

## 2023-09-22 VITALS — BP 131/78 | HR 84 | Temp 97.2°F | Wt 245.4 lb

## 2023-09-22 DIAGNOSIS — Z1509 Genetic susceptibility to other malignant neoplasm: Secondary | ICD-10-CM

## 2023-09-22 DIAGNOSIS — Z1501 Genetic susceptibility to malignant neoplasm of breast: Secondary | ICD-10-CM
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
## 2023-09-27 ENCOUNTER — Encounter: Payer: Self-pay | Admitting: Family Medicine

## 2023-09-27 DIAGNOSIS — M5020 Other cervical disc displacement, unspecified cervical region: Secondary | ICD-10-CM

## 2023-09-27 DIAGNOSIS — M5412 Radiculopathy, cervical region: Secondary | ICD-10-CM

## 2023-09-27 DIAGNOSIS — G8929 Other chronic pain: Secondary | ICD-10-CM

## 2023-09-28 MED ORDER — OXYCODONE-ACETAMINOPHEN 5-325 MG PO TABS: 1.0000 | ORAL_TABLET | ORAL | 0 refills | Status: DC | PRN

## 2023-09-29 ENCOUNTER — Encounter: Payer: Self-pay | Admitting: Obstetrics and Gynecology

## 2023-10-21 ENCOUNTER — Encounter: Payer: Self-pay | Admitting: Obstetrics and Gynecology

## 2023-10-26 ENCOUNTER — Telehealth: Payer: Self-pay

## 2023-10-26 DIAGNOSIS — F319 Bipolar disorder, unspecified: Secondary | ICD-10-CM

## 2023-10-26 DIAGNOSIS — F428 Other obsessive-compulsive disorder: Secondary | ICD-10-CM

## 2023-10-26 DIAGNOSIS — F902 Attention-deficit hyperactivity disorder, combined type: Secondary | ICD-10-CM

## 2023-10-26 MED ORDER — ESTRADIOL 0.06 MG/24HR TD PTWK: 1.0000 | MEDICATED_PATCH | TRANSDERMAL | 3 refills | Status: DC

## 2023-10-26 NOTE — Telephone Encounter (Signed)
 Personal aid services are usually not medically covered. Often these services are out of pocket cost to patients.  Home Health agency may have social worker she can link with that can recommend services to her or assist with this. Otherwise if she prefers to go through our [facility] team, I can place orders for referral to our Social Worker / Case Management and see if they have recommendations on any in home personal care services, again it may be out of pocket cost, or if there is anything she could qualify for they can assist.  Also can she clarify her Disability diagnosis so we have confirmation? We would need the precise diagnosis to match up if we place a referral. Thank you  Domingo Friend, DO [facility] 10/26/2023, 1:24 PM

## 2023-10-26 NOTE — Telephone Encounter (Signed)
 Copied from CRM [id number]. Topic: Referral - Request for Referral >> Oct 26, 2023 12:33 PM Baldemar Lev wrote: Pt called reporting that she was evaluated today by home health and they recommend that she gets personal aid to help her at home since she is disabled. Please advise  Can we respond through MyChart? Yes.

## 2023-10-26 NOTE — Telephone Encounter (Signed)
 This encounter was created in error - please disregard.

## 2023-10-28 ENCOUNTER — Encounter: Payer: Self-pay | Admitting: Family Medicine

## 2023-11-01 ENCOUNTER — Encounter: Payer: Self-pay | Admitting: Family Medicine

## 2023-11-01 ENCOUNTER — Telehealth (INDEPENDENT_AMBULATORY_CARE_PROVIDER_SITE_OTHER): Admitting: Family Medicine

## 2023-11-01 DIAGNOSIS — M4802 Spinal stenosis, cervical region: Secondary | ICD-10-CM

## 2023-11-01 DIAGNOSIS — M47816 Spondylosis without myelopathy or radiculopathy, lumbar region: Secondary | ICD-10-CM

## 2023-11-01 DIAGNOSIS — G8929 Other chronic pain: Secondary | ICD-10-CM | POA: Diagnosis not present

## 2023-11-01 DIAGNOSIS — M542 Cervicalgia: Secondary | ICD-10-CM

## 2023-11-01 MED ORDER — CYCLOBENZAPRINE HCL 10 MG PO TABS: 10.0000 mg | ORAL_TABLET | Freq: Three times a day (TID) | ORAL | 2 refills | Status: AC | PRN

## 2023-11-01 NOTE — Progress Notes (Signed)
 Subjective:    Patient ID: Suzanne Wilcox, female    DOB: February 06, 1976, 48 y.o.   MRN: [id number]  Suzanne Wilcox is a 48 y.o. female presenting on 11/01/2023 for Neck Pain   Virtual / Telehealth Encounter - Video Visit via MyChart The purpose of this virtual visit is to provide medical care while limiting exposure to the novel coronavirus (COVID19) for both patient and office staff.  Consent was obtained for remote visit:  Yes.   Answered questions that patient had about telehealth interaction:  Yes.   I discussed the limitations, risks, security and privacy concerns of performing an evaluation and management service by video/telephone. I also discussed with the patient that there may be a patient responsible charge related to this service. The patient expressed understanding and agreed to proceed.  Patient Location: Home Provider Location: [facility] (Office)  Participants in virtual visit: - Patient: Suzanne Wilcox - CMA: Nolberto Batty CMA - Provider: Dr Romeo Co   HPI  Discussed the use of AI scribe software for clinical note transcription with the patient, who gave verbal consent to proceed.  History of Present Illness   Suzanne Wilcox is a 48 year old female with a history of foraminal stenosis and herniated disc who presents with concerns about muscle relaxant efficacy and medication management for neck and lower back pain.  She experiences ongoing neck and lower back pain, which is associated with headaches occurring every morning due to neck pain. She has been taking Excedrin, which contains acetaminophen  and caffeine, to manage the headaches. She has reduced her Excedrin intake to one tablet in the morning to minimize acetaminophen  consumption due to concerns about potential liver damage.  She has a history of foraminal stenosis and a herniated disc, confirmed by an MRI in 2023, which causes nerve pinching. She has tried various muscle relaxants in the past,  including tizanidine , baclofen , and methocarbamol , but reports that methocarbamol  750 mg is not effective. She has also used Flexeril previously with some success.  She is currently prescribed oxycodone  5 mg/325 mg acetaminophen  but is trying to avoid daily use. She is exploring the use of muscle relaxants at night to alleviate morning headaches and neck pain.  Her estradiol  patch dosage was recently increased from 0.5 to 0.6 mg due to mood fluctuations experienced towards the end of the patch cycle. She changes the patch every Thursday and noticed mood plummeting from Wednesday to Friday, prompting the dosage adjustment by her OB.           08/06/2023    8:59 AM 06/04/2023   10:58 AM 05/11/2023    9:23 AM  Depression screen PHQ 2/9  Decreased Interest 0 1 3  Down, Depressed, Hopeless 0 1 2  PHQ - 2 Score 0 2 5  Altered sleeping 0 1 2  Tired, decreased energy 0 2 3  Change in appetite 0 2 1  Feeling bad or failure about yourself  0 2 3  Trouble concentrating 0 3 3  Moving slowly or fidgety/restless 0 0 0  Suicidal thoughts 0 0 0  PHQ-9 Score 0 12 17  Difficult doing work/chores Not difficult at all Very difficult Extremely dIfficult       06/04/2023   10:58 AM 05/11/2023    9:23 AM 05/03/2023    9:24 AM 04/02/2023    9:07 AM  GAD 7 : Generalized Anxiety Score  Nervous, Anxious, on Edge 3 3 3 3   Control/stop worrying 2  3 2 1   Worry too much - different things 2 3 2 2   Trouble relaxing 2 3 2 3   Restless 1 1 2 2   Easily annoyed or irritable 2 3 3 2   Afraid - awful might happen 2 3 2 1   Total GAD 7 Score 14 19 16 14     Social History   Tobacco Use   Smoking status: Former    Current packs/day: 0.00    Average packs/day: 1 pack/day for 18.0 years (18.0 ttl pk-yrs)    Types: Cigarettes    Start date: 04/30/1991    Quit date: 04/29/2009    Years since quitting: 14.5   Smokeless tobacco: Never  Vaping Use   Vaping status: Never Used  Substance Use Topics   Alcohol use: Yes     Comment: OCCASIONALLY   Drug use: No    Review of Systems Per HPI unless specifically indicated above     Objective:    There were no vitals taken for this visit.  Wt Readings from Last 3 Encounters:  09/22/23 245 lb 6.4 oz (111.3 kg)  09/16/23 241 lb 9.6 oz (109.6 kg)  09/03/23 249 lb 11.2 oz (113.3 kg)     Physical Exam  Note examination was completely remotely via video observation objective data only  Gen - well-appearing, no acute distress or apparent pain, comfortable HEENT - eyes appear clear without discharge or redness Heart/Lungs - cannot examine virtually - observed no evidence of coughing or labored breathing. Abd - cannot examine virtually  Skin - face visible today- no rash Neuro - awake, alert, oriented Psych - not anxious appearing   Results for orders placed or performed in visit on 09/03/23  Hemoglobin and hematocrit, blood   Collection Time: 09/03/23 11:51 AM  Result Value Ref Range   Hemoglobin 9.9 (L) 11.1 - 15.9 g/dL   Hematocrit 62.9 (L) 52.8 - 46.6 %      Assessment & Plan:   Problem List Items Addressed This Visit     Chronic neck pain - Primary   Relevant Medications   cyclobenzaprine (FLEXERIL) 10 MG tablet   Spondylosis of lumbar joint   Relevant Medications   cyclobenzaprine (FLEXERIL) 10 MG tablet   Other Visit Diagnoses       Cervical spinal stenosis       Relevant Medications   cyclobenzaprine (FLEXERIL) 10 MG tablet        Chronic neck pain Cervical Foraminal Stenosis / Radiculopathy Prior MRI Chronic neck pain with headaches likely due to cervical foraminal stenosis. Methocarbamol  ineffective, trying to limit usage of daily Percocet.  Note failed baclofen  tizanidine  in past Flexeril effective previously.  Also chronic low back pain  - Order Flexeril 10 mg, instruct to take up to three times daily as needed, option to split for 5 mg if sedation occurs. - Send prescription to [facility] in [place].  Headache due  to neck pain Headaches secondary to chronic neck pain and cervical foraminal stenosis. Reducing acetaminophen  by decreasing Excedrin. - Monitor headache frequency and severity with new Flexeril regimen.    No orders of the defined types were placed in this encounter.   Meds ordered this encounter  Medications   cyclobenzaprine (FLEXERIL) 10 MG tablet    Sig: Take 1 tablet (10 mg total) by mouth 3 (three) times daily as needed for muscle spasms.    Dispense:  90 tablet    Refill:  2    Follow up plan: Return if symptoms worsen or  fail to improve.   Patient verbalizes understanding with the above medical recommendations including the limitation of remote medical advice.  Specific follow-up and call-back criteria were given for patient to follow-up or seek medical care more urgently if needed.  Total duration of direct patient care provided via video conference: 9 minutes   Domingo Friend, DO [facility] 11/01/2023, 10:38 AM

## 2023-11-01 NOTE — Patient Instructions (Addendum)
  Start Cyclobenzapine (Flexeril) 10mg  tablets (muscle relaxant) - start with half (cut) to one whole pill at night for muscle relaxant - may make you sedated or sleepy (be careful driving or working on this) if tolerated you can take half to whole tab 2 to 3 times daily or every 8 hours as needed   Please schedule a Follow-up Appointment to: Return if symptoms worsen or fail to improve.  If you have any other questions or concerns, please feel free to call the office or send a message through MyChart. You may also schedule an earlier appointment if necessary.  Additionally, you may be receiving a survey about your experience at our office within a few days to 1 week by e-mail or mail. We value your feedback.  Domingo Friend, DO [facility], [place]

## 2023-11-02 ENCOUNTER — Telehealth: Payer: Self-pay

## 2023-11-02 NOTE — Progress Notes (Signed)
 Complex Care Management Note  Care Guide Note 11/02/2023 Name: Suzanne Wilcox MRN: [id number] DOB: 1975/10/27  Suzanne Wilcox is a 48 y.o. year old female who sees Raina Bunting, DO for primary care. I reached out to Suzanne Wilcox by phone today to offer complex care management services.  [name] was given information about Complex Care Management services today including:   The Complex Care Management services include support from the care team which includes your Nurse Care Manager, Clinical Social Worker, or Pharmacist.  The Complex Care Management team is here to help remove barriers to the health concerns and goals most important to you. Complex Care Management services are voluntary, and the patient may decline or stop services at any time by request to their care team member.   Complex Care Management Consent Status: Patient agreed to services and verbal consent obtained.   Follow up plan:  Telephone appointment with complex care management team member scheduled for:  [facility] 11/08/2023 AND LCSW 11/23/2023  Encounter Outcome:  Patient Scheduled  Lenton Rail , RMA     [place]  [facility], [facility] Guide  Direct Dial: [phone]  Website: [email or web address]

## 2023-11-08 ENCOUNTER — Telehealth: Payer: Self-pay

## 2023-11-08 ENCOUNTER — Other Ambulatory Visit: Payer: Self-pay

## 2023-11-08 NOTE — Telephone Encounter (Signed)
 Triage Voicemail: Patient of Dr. Ranny Bye left a message stating her Estradiol  was increased at her visit 4/29 and she reports her heart has been racing and she's been experiencing palpitations since increasing the dosage.  She says she can look down and see her heart beating through her breastbone.   Please advise.  Thanks, Harley Lies. RN

## 2023-11-08 NOTE — Patient Outreach (Signed)
 Complex Care Management   Visit Note  11/08/2023  Name:  Suzanne Wilcox MRN: [id number] DOB: 24-Feb-1976  Situation: Referral received for Complex Care Management related to BiPolar, in-home care needs. I obtained verbal consent from Patient.  Visit completed with patient  on the phone  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   Postoperative retention of urine 03/28/2016   Postpartum care following cesarean delivery (9/29) 03/27/2016   Sleep apnea    Termination of pregnancy (fetus) 03/22/2004   X 1   Vaginal Pap smear, abnormal     Assessment: Patient Reported Symptoms:  Cognitive Cognitive Status: Alert and oriented to person, place, and time, Difficulties with attention and concentration Cognitive/Intellectual Conditions Management [RPT]: Autism Spectrum Disorder, Behavior Disorders Behavior Disorders: ADHD, Bipolar 1 Disorder, OCD   Health Maintenance Behaviors: Annual physical exam  Neurological Neurological Review of Symptoms: No symptoms reported    HEENT HEENT Symptoms Reported: No symptoms reported      Cardiovascular Cardiovascular Symptoms Reported: No symptoms reported (Takes furosemide  40mg  as needed for leg edema, uses automatic compression devices as needed.) Cardiovascular Conditions: Hypertension Cardiovascular Management Strategies: Medication therapy  Respiratory Respiratory Symptoms Reported: No symptoms  reported    Endocrine Patient reports the following symptoms related to hypoglycemia or hyperglycemia : No symptoms reported Is patient diabetic?: No    Gastrointestinal Gastrointestinal Symptoms Reported: Constipation (Takes Miralax daily, has stool softeners as needed.  Constipation has been a chronic problem.) Gastrointestinal Conditions: Constipation    Genitourinary Genitourinary Symptoms Reported: Pain with urination, Other Other Genitourinary Symptoms: cloudy urine Additional Genitourinary Details: she is taking "left over amoxicillin " and AZO to treat, reports symptoms of cloudy urine are getting better.. Genitourinary Management Strategies: Medication therapy  Integumentary Integumentary Symptoms Reported: No symptoms reported    Musculoskeletal Musculoskelatal Symptoms Reviewed: Other (Reports chronic back pain with certain actions such as lifting arms above her head, folding laundry, moderate activity) Musculoskeletal Conditions: Back pain Musculoskeletal Management Strategies: Medication therapy      Psychosocial   Behavioral Health Conditions: Bipolar affective disorder, Autism, ADHD/ADD Behavioral Management Strategies: Medication therapy   Do you feel physically threatened by others?: No      11/08/2023   10:37 AM  Depression screen PHQ 2/9  Decreased Interest 1  Down, Depressed, Hopeless 1  PHQ - 2 Score 2  Altered sleeping 0  Tired, decreased energy 1  Change in appetite 0  Feeling bad or failure about yourself  3  Trouble concentrating 3  Moving slowly or fidgety/restless 0  Suicidal thoughts 0  PHQ-9 Score 9  Difficult doing work/chores Very difficult    There were no vitals filed for this visit.  Medications Reviewed Today     Reviewed by Isadore Marble,  RN (Registered Nurse) on 11/08/23 at 1025  Med List Status: <None>   Medication Order Taking? Sig Documenting Provider Last Dose Status Informant  acetaminophen  (TYLENOL ) 500 MG tablet [id number]  Yes Take 1 tablet (500 mg total) by mouth every 6 (six) hours as needed. Teresa Fender, MD Taking Active   acetaminophen -caffeine (EXCEDRIN TENSION HEADACHE) 500-65 MG TABS per tablet [id number] Yes Take 1 tablet by mouth daily. May take another 2 tablet dose throughout the day as needed for a headache [provider] Taking Active Self  ALPRAZolam (XANAX) 1 MG tablet [id number] Yes Take 1 mg by mouth 3 (three) times daily as needed for anxiety. [provider] Taking Active Self  amLODipine  (NORVASC ) 5 MG tablet [id number] Yes TAKE 1 TABLET(5 MG) BY MOUTH DAILY Karamalegos, Kayleen Party, DO Taking Active   amphetamine-dextroamphetamine (ADDERALL) 30 MG tablet [id number] Yes Take 30 mg by mouth daily. May take a second 30 mg dose in the afternoon as needed for attention [provider] Taking Active Self  buPROPion  (WELLBUTRIN ) 100 MG tablet [id number] Yes Take 100-200 mg by mouth See admin instructions. Take 200 mg in the morning and 100 mg in the evening [provider] Taking Active Self  Calcium Citrate-Vitamin D (CALCIUM CITRATE CHEWY BITE PO) [id number] Yes Take 1 tablet by mouth 3 (three) times daily. [provider] Taking Active Self  CAPLYTA 42 MG capsule [id number] Yes Take 42 mg by mouth at bedtime. [provider] Taking Active Self  Coenzyme Q10 (COQ-10 PO) [id number] Yes Take 1 tablet by mouth daily. [provider] Taking Active Self  cyclobenzaprine  (FLEXERIL ) 10 MG tablet [id number] Yes Take 1 tablet (10 mg total) by mouth 3 (three) times daily as needed for muscle spasms. Raina Bunting, DO Taking Active   docusate sodium  (COLACE) 100 MG capsule [id number] Yes Take 1 capsule (100 mg total) by mouth 2 (two) times daily as needed. Teresa Fender, MD Taking Active   estradiol  (CLIMARA ) 0.06 MG/24HR [id number] Yes Place 1 patch onto the skin once a week. Teresa Fender, MD Taking Active   Iron-Vitamin C (VITRON-C) 65-125 MG TABS  [id number] Yes Take 1 tablet by mouth 2 (two) times daily. [provider] Taking Active Self  magic mouthwash w/lidocaine  SOLN [id number] Yes Swish, gargle, and spit one to two teaspoonfuls every six hours as needed. Shake well before using. Raina Bunting, DO Taking Active Self           Med Note Shann Darnel, KYLE A   Thu Aug 05, 2023  8:43 AM) Hasn't started  MAGNESIUM-POTASSIUM PO [id number] No Take 1 tablet by mouth daily.  Patient not taking: Reported on 09/22/2023   [provider] Not Taking Active Self  Multiple Vitamins-Minerals (BARIATRIC MULTIVITAMINS/IRON) CAPS [id number] Yes Take 1 capsule by mouth daily. [provider] Taking Active Self  nystatin (MYCOSTATIN/NYSTOP) powder [id number] Yes Apply 1 Application topically 2 (two) times daily as needed (rash). [provider] Taking Active Self  nystatin cream (MYCOSTATIN) [id number] Yes Apply 1 Application topically 2 (two) times daily as needed (rash). [provider] Taking Active Self  Omega-3 Fatty Acids (FISH OIL PO) [id number] Yes Take 1 capsule by mouth daily. [provider] Taking Active Self  ondansetron  (ZOFRAN ) 8 MG tablet [id number] Yes Take 8 mg by mouth every 8 (eight) hours as needed for nausea or vomiting. Pt has not started medication yet [provider] Taking Active Self           Med Note Tommas Fragmin,  [facility]   Wed Apr 28, 2023  9:18 AM)    oxyCODONE -acetaminophen  (PERCOCET/ROXICET) 5-325 MG tablet [id number] Yes Take 1 tablet by mouth every 4 (four) hours as needed for severe pain (pain score 7-10). Raina Bunting, DO Taking Active   pantoprazole  (PROTONIX ) 40 MG tablet [id number] Yes Take 1 tablet (40 mg total) by mouth 2 (two) times daily before a meal. Romeo Co, Kayleen Party, DO Taking Active   Phenylephrine -Acetaminophen  (TYLENOL  SINUS CONGESTION/PAIN PO) [id number] Yes Take 2 tablets by mouth daily as needed (congestion). [provider]  Taking Active Self            Recommendation:   -Referral to: LCSW for needs relating to finding Behavioral Health counselor that takes her insurance, phone appointment already scheduled with Chrystal Land/LCSW -Patient is requesting order for personal care aide for help in her home performing light house cleaning.  She will call [facility] Customer service number and request agencies that partner with her [facility] plan.    Follow Up Plan:   Telephone consult with Chrystal Land, LCSW on 11/23/2023 Telephone follow-up Wednesday, June 11th at 10:00am  Jurline Olmsted BSN, CCM [place]  [facility] Population Health RN Care Manager Direct Dial: [phone]  Fax: [phone]

## 2023-11-08 NOTE — Patient Instructions (Signed)
 Visit Information  Thank you for taking time to visit with me today. Please don't hesitate to contact me if I can be of assistance to you before our next scheduled appointment.  REMEMBER: CALL [employer] customer service number on back of your card to inquire of agencies that partner with [facility] for light housekeeping, in-home care. Then, call Jurline Olmsted, RNCM to inform of which agencies we can request order.   Our next appointment is by telephone on Wednesday, June 11th at 10:00AM.  Please call the care guide team at [phone] if you need to cancel or reschedule your appointment.   Following is a copy of your care plan:   Goals Addressed             This Visit's Progress    DIET - EAT MORE FRUITS AND VEGETABLES       DIET - INCREASE WATER INTAKE       VBCI RN Care Plan       Problems:  Chronic Disease Management support and education needs related to Bipolar Disorder and Depression  Goal: Over the next 2 months the Patient will continue to work with RN Care Manager and/or Social Worker to address care management and care coordination needs related to Bipolar Disorder and Depression as evidenced by adherence to care management team scheduled appointments      Interventions:   Evaluation of current treatment plan related to Bipolar Disorder and Depression, Financial constraints related to finding another licensed psychologist/counselor and inability to self-pay with current [facility] counselor/counselor not available at this time,  self-management and patient's adherence to plan as established by provider. Discussed plans with patient for ongoing care management follow up and provided patient with direct contact information for care management team Provided patient with Managing BiPolar Disorder educational materials related to BiPolar I.  Social Work referral for Licensed Clinical profession for evaluation, already scheduled for 11/23/23 Discussed plans with patient for ongoing care management  follow up and provided patient with direct contact information for care management team Screening for signs and symptoms of depression related to chronic disease state  Assessed social determinant of health barriers On 11/08/2023, PHQ9=9, mild depression  Patient Self-Care Activities:  Attend all scheduled provider appointments Call provider office for new concerns or questions  Work with the social worker to address care coordination needs and will continue to work with the clinical team to address health care and disease management related needs  Plan:  Telephone follow up appointment with care management team member scheduled for:  Wednesday, June 11th at 10:00am with [facility] RNCM.              Please call the Suicide and Crisis Lifeline: 988 if you are experiencing a Mental Health or Behavioral Health Crisis or need someone to talk to.  Patient verbalizes understanding of instructions and care plan provided today and agrees to view in MyChart. Active MyChart status and patient understanding of how to access instructions and care plan via MyChart confirmed with patient.     Jurline Olmsted BSN, CCM [place]  [facility] Population Health RN Care Manager Direct Dial: [phone]  Fax: [phone]

## 2023-11-11 ENCOUNTER — Encounter: Payer: Self-pay | Admitting: Plastic Surgery

## 2023-11-12 ENCOUNTER — Encounter: Payer: Self-pay | Admitting: Family Medicine

## 2023-11-12 ENCOUNTER — Telehealth: Admitting: Family Medicine

## 2023-11-12 DIAGNOSIS — N3000 Acute cystitis without hematuria: Secondary | ICD-10-CM

## 2023-11-12 MED ORDER — FLUCONAZOLE 150 MG PO TABS: 150.0000 mg | ORAL_TABLET | Freq: Every day | ORAL | 0 refills | Status: AC

## 2023-11-12 MED ORDER — CEPHALEXIN 500 MG PO CAPS: 500.0000 mg | ORAL_CAPSULE | Freq: Two times a day (BID) | ORAL | 0 refills | Status: AC

## 2023-11-12 NOTE — Patient Instructions (Signed)

## 2023-11-12 NOTE — Progress Notes (Signed)
 Virtual Visit Consent   Suzanne Wilcox, you are scheduled for a virtual visit with a [facility] provider today. Just as with appointments in the office, your consent must be obtained to participate. Your consent will be active for this visit and any virtual visit you may have with one of our providers in the next 365 days. If you have a MyChart account, a copy of this consent can be sent to you electronically.  As this is a virtual visit, video technology does not allow for your provider to perform a traditional examination. This may limit your provider's ability to fully assess your condition. If your provider identifies any concerns that need to be evaluated in person or the need to arrange testing (such as labs, EKG, etc.), we will make arrangements to do so. Although advances in technology are sophisticated, we cannot ensure that it will always work on either your end or our end. If the connection with a video visit is poor, the visit may have to be switched to a telephone visit. With either a video or telephone visit, we are not always able to ensure that we have a secure connection.  By engaging in this virtual visit, you consent to the provision of healthcare and authorize for your insurance to be billed (if applicable) for the services provided during this visit. Depending on your insurance coverage, you may receive a charge related to this service.  I need to obtain your verbal consent now. Are you willing to proceed with your visit today? Suzanne Wilcox has provided verbal consent on 11/12/2023 for a virtual visit (video or telephone). Albertha Huger, FNP  Date: 11/12/2023 3:02 PM   Virtual Visit via Video Note   I, Albertha Huger, connected with  Suzanne Wilcox  ([id number], [birth year]) on 11/12/23 at  3:00 PM EDT by a video-enabled telemedicine application and verified that I am speaking with the correct person using two identifiers.  Location: Patient: Virtual Visit Location Patient:  Home Provider: Virtual Visit Location Provider: Home Office   I discussed the limitations of evaluation and management by telemedicine and the availability of in person appointments. The patient expressed understanding and agreed to proceed.    History of Present Illness: Suzanne Wilcox is a 48 y.o. who identifies as a female who was assigned female at birth, and is being seen today for burning and frequency with urination. No fever or abd pain. Aaron Aas  HPI: HPI  Problems:  Patient Active Problem List   Diagnosis Date Noted   Status post laparoscopic cholecystectomy 09/16/2023   BRCA2 gene mutation positive 06/11/2023   Chronic neck pain 04/02/2023   Lymphedema 03/02/2023   Varicose veins of leg with pain, right 03/02/2023   Encounter for screening colonoscopy 09/04/2022   Essential hypertension 10/29/2021   Bilateral lower extremity edema 10/29/2021   Class 3 severe obesity due to excess calories without serious comorbidity with body mass index (BMI) of 50.0 to 59.9 in adult 10/29/2021   OSA (obstructive sleep apnea) 10/29/2021   Attention deficit hyperactivity disorder (ADHD), combined type 10/29/2021   GAD (generalized anxiety disorder) 10/29/2021   Excessive daytime sleepiness 12/16/2020   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   Herniated cervical intervertebral disc 06/28/2017   Fatty (change of) liver, not elsewhere classified 12/27/2016   Bipolar 1 disorder (HCC) 05/11/2012   OCD (obsessive compulsive disorder) 05/11/2012   Substance abuse (HCC) 05/11/2012    Allergies:  Allergies  Allergen Reactions   Wound Dressing Adhesive Itching,  Other (See Comments) and Swelling   Wound Dressings Itching, Other (See Comments) and Swelling   Adhesive [Tape] Itching and Swelling    If left on for prolonged periods of time    Lyrica  [Pregabalin ] Photosensitivity   Medications:  Current Outpatient Medications:    cephALEXin  (KEFLEX ) 500 MG capsule, Take 1 capsule (500 mg total) by  mouth 2 (two) times daily for 7 days., Disp: 14 capsule, Rfl: 0   fluconazole  (DIFLUCAN ) 150 MG tablet, Take 1 tablet (150 mg total) by mouth daily for 2 days., Disp: 2 tablet, Rfl: 0   acetaminophen  (TYLENOL ) 500 MG tablet, Take 1 tablet (500 mg total) by mouth every 6 (six) hours as needed., Disp: 30 tablet, Rfl: 0   acetaminophen -caffeine (EXCEDRIN TENSION HEADACHE) 500-65 MG TABS per tablet, Take 1 tablet by mouth daily. May take another 2 tablet dose throughout the day as needed for a headache, Disp: , Rfl:    ALPRAZolam (XANAX) 1 MG tablet, Take 1 mg by mouth 3 (three) times daily as needed for anxiety., Disp: , Rfl:    amLODipine  (NORVASC ) 5 MG tablet, TAKE 1 TABLET(5 MG) BY MOUTH DAILY, Disp: 90 tablet, Rfl: 1   amphetamine-dextroamphetamine (ADDERALL) 30 MG tablet, Take 30 mg by mouth daily. May take a second 30 mg dose in the afternoon as needed for attention, Disp: , Rfl:    buPROPion  (WELLBUTRIN ) 100 MG tablet, Take 100-200 mg by mouth See admin instructions. Take 200 mg in the morning and 100 mg in the evening, Disp: , Rfl:    Calcium Citrate-Vitamin D (CALCIUM CITRATE CHEWY BITE PO), Take 1 tablet by mouth 3 (three) times daily., Disp: , Rfl:    CAPLYTA 42 MG capsule, Take 42 mg by mouth at bedtime., Disp: , Rfl:    Coenzyme Q10 (COQ-10 PO), Take 1 tablet by mouth daily., Disp: , Rfl:    cyclobenzaprine  (FLEXERIL ) 10 MG tablet, Take 1 tablet (10 mg total) by mouth 3 (three) times daily as needed for muscle spasms., Disp: 90 tablet, Rfl: 2   docusate sodium  (COLACE) 100 MG capsule, Take 1 capsule (100 mg total) by mouth 2 (two) times daily as needed., Disp: 30 capsule, Rfl: 2   estradiol  (CLIMARA ) 0.06 MG/24HR, Place 1 patch onto the skin once a week., Disp: 12 patch, Rfl: 3   furosemide  (LASIX ) 40 MG tablet, Take 40 mg by mouth as needed for edema (Takles as needed for swelling in legs/ankles/feet)., Disp: , Rfl:    Iron-Vitamin C (VITRON-C) 65-125 MG TABS, Take 1 tablet by mouth 2 (two)  times daily., Disp: , Rfl:    magic mouthwash w/lidocaine  SOLN, Swish, gargle, and spit one to two teaspoonfuls every six hours as needed. Shake well before using., Disp: 120 mL, Rfl: 0   MAGNESIUM-POTASSIUM PO, Take 1 tablet by mouth daily. (Patient not taking: Reported on 09/22/2023), Disp: , Rfl:    Multiple Vitamins-Minerals (BARIATRIC MULTIVITAMINS/IRON) CAPS, Take 1 capsule by mouth daily., Disp: , Rfl:    nystatin (MYCOSTATIN/NYSTOP) powder, Apply 1 Application topically 2 (two) times daily as needed (rash)., Disp: , Rfl:    nystatin cream (MYCOSTATIN), Apply 1 Application topically 2 (two) times daily as needed (rash)., Disp: , Rfl:    Omega-3 Fatty Acids (FISH OIL PO), Take 1 capsule by mouth daily., Disp: , Rfl:    ondansetron  (ZOFRAN ) 8 MG tablet, Take 8 mg by mouth every 8 (eight) hours as needed for nausea or vomiting. Pt has not started medication yet, Disp: , Rfl:  oxyCODONE -acetaminophen  (PERCOCET/ROXICET) 5-325 MG tablet, Take 1 tablet by mouth every 4 (four) hours as needed for severe pain (pain score 7-10)., Disp: 60 tablet, Rfl: 0   pantoprazole  (PROTONIX ) 40 MG tablet, Take 1 tablet (40 mg total) by mouth 2 (two) times daily before a meal., Disp: 60 tablet, Rfl: 2   Phenylephrine -Acetaminophen  (TYLENOL  SINUS CONGESTION/PAIN PO), Take 2 tablets by mouth daily as needed (congestion)., Disp: , Rfl:   Observations/Objective: Patient is well-developed, well-nourished in no acute distress.  Resting comfortably  at home.  Head is normocephalic, atraumatic.  No labored breathing.  Speech is clear and coherent with logical content.  Patient is alert and oriented at baseline.    Assessment and Plan: 1. Acute cystitis without hematuria (Primary)  Increase fluids, prevention discussed, UC if sx persist or worsen.   Follow Up Instructions: I discussed the assessment and treatment plan with the patient. The patient was provided an opportunity to ask questions and all were answered.  The patient agreed with the plan and demonstrated an understanding of the instructions.  A copy of instructions were sent to the patient via MyChart unless otherwise noted below.     The patient was advised to call back or seek an in-person evaluation if the symptoms worsen or if the condition fails to improve as anticipated.    Azalya Galyon, FNP

## 2023-11-12 NOTE — Progress Notes (Deleted)
    GYNECOLOGY PROGRESS NOTE  Subjective:  PCP: Raina Bunting, DO  Patient ID: Cephus Collin, female    DOB: [birth year], 48 y.o.   MRN: [id number]  HPI  Patient is a 48 y.o. [id number] female who presents for   {Common ambulatory SmartLinks:19316}  Review of Systems {ros; complete:30496}   Objective:   There were no vitals taken for this visit. There is no height or weight on file to calculate BMI.  General appearance: {general exam:16600} Abdomen: {abdominal exam:16834} Pelvic: {pelvic exam:16852::"cervix normal in appearance","external genitalia normal","no adnexal masses or tenderness","no cervical motion tenderness","rectovaginal septum normal","uterus normal size, shape, and consistency","vagina normal without discharge"} Extremities: {extremity exam:5109} Neurologic: {neuro exam:17854}   Assessment/Plan:   No diagnosis found.   There are no diagnoses linked to this encounter.     Sofia Dunn, DO  [facility]

## 2023-11-16 ENCOUNTER — Encounter (INDEPENDENT_AMBULATORY_CARE_PROVIDER_SITE_OTHER): Payer: Self-pay

## 2023-11-23 ENCOUNTER — Ambulatory Visit: Admitting: Obstetrics

## 2023-11-23 ENCOUNTER — Other Ambulatory Visit: Payer: Self-pay | Admitting: *Deleted

## 2023-11-23 ENCOUNTER — Encounter: Payer: Self-pay | Admitting: Family Medicine

## 2023-11-24 NOTE — Patient Outreach (Signed)
 Complex Care Management   Visit Note  11/24/2023 late entry  Name:  Suzanne Wilcox MRN: [id number] DOB: 1975/08/12  Situation: Referral received for Complex Care Management related to Financial strain and need for in home aid support due to Chronic neck pain Cervical Foraminal Stenosis I obtained verbal consent from Patient.  Visit completed with patient  on the phone on 11/23/22  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   Postoperative retention of urine 03/28/2016   Postpartum care following cesarean delivery (9/29) 03/27/2016   Sleep apnea    Termination of pregnancy (fetus) 03/22/2004   X 1   Vaginal Pap smear, abnormal     Assessment: Patient Reported Symptoms:  Cognitive Cognitive Status: Alert and oriented to person, place, and time, Normal speech and language skills, Difficulties with attention and concentration Cognitive/Intellectual Conditions Management [RPT]: Autism Spectrum Disorder, Behavior Disorders Behavior Disorders: ADHD, Bipolar 1, OCD   Health Maintenance Behaviors: Annual physical exam Healing Pattern: Average Health Facilitated by: Rest  Neurological Neurological Review of Symptoms: No symptoms reported    HEENT HEENT Symptoms Reported: No symptoms reported      Cardiovascular Cardiovascular Symptoms Reported: No symptoms reported    Respiratory Respiratory Symptoms Reported: No symptoms reported     Endocrine Patient reports the following symptoms related to hypoglycemia or hyperglycemia : No symptoms reported    Gastrointestinal Gastrointestinal Symptoms Reported: Constipation Additional Gastrointestinal Details: Takes miralax daily Gastrointestinal Conditions: Constipation    Genitourinary Genitourinary Symptoms Reported: No symptoms reported    Integumentary Integumentary Symptoms Reported: No symptoms reported    Musculoskeletal Musculoskelatal Symptoms Reviewed: Difficulty walking, Other Other Musculoskeletal Symptoms: during increased back pain cannot walk or bend down(small moves will cause a flair up) Musculoskeletal Conditions: Back pain Musculoskeletal Management Strategies: Medication therapy Musculoskeletal Self-Management Outcome: 4 (good) Falls in the past year?: No Number of falls in past year: 1 or less Was there an injury with Fall?: No Fall Risk Category Calculator: 0 Patient Fall Risk Level: Low Fall Risk    Psychosocial Psychosocial Symptoms Reported: Depression - if selected complete PHQ 2-9 Behavioral Health Conditions: Bipolar affective disorder, Autism, Post traumatic stress Behavioral Management Strategies: Medication therapy Major Change/Loss/Stressor/Fears (CP): Medical condition, self Behaviors When Feeling Stressed/Fearful: masking, floating "I need to see at therapist" Techniques to Cope with Loss/Stress/Change: Diversional activities, Medication Quality of Family Relationships: supportive Do you feel physically threatened by others?: No      11/23/2023   10:22 AM  Depression screen PHQ 2/9  Decreased Interest 1  Down, Depressed, Hopeless 1  PHQ - 2 Score 2  Altered sleeping 0  Tired, decreased energy 1  Change in appetite 0  Feeling bad or failure about yourself  3  Trouble concentrating 3  Moving slowly or fidgety/restless 0  Suicidal thoughts 0  PHQ-9 Score 9    There were no vitals filed for this visit.  Medications Reviewed  Today     Reviewed by Ave Leisure, LCSW (Social Worker) on 11/23/23 at 1021  Med List Status: <None>   Medication Order Taking? Sig Documenting Provider Last Dose Status Informant  acetaminophen  (TYLENOL ) 500 MG tablet [id number] Yes Take 1 tablet (500 mg total) by mouth every 6 (six) hours as needed. Teresa Fender, MD Taking Active   acetaminophen -caffeine (EXCEDRIN TENSION HEADACHE) 500-65 MG TABS per tablet [id number] Yes Take 1 tablet by mouth daily. May take another 2 tablet dose throughout the day as needed for a headache [provider] Taking Active Self  ALPRAZolam (XANAX) 1 MG tablet [id number] Yes Take 1 mg by mouth 3 (three) times daily as needed for anxiety. [provider] Taking Active Self  amLODipine  (NORVASC ) 5 MG tablet [id number] Yes TAKE 1 TABLET(5 MG) BY MOUTH DAILY Karamalegos, Kayleen Party, DO Taking Active   amphetamine-dextroamphetamine (ADDERALL) 30 MG tablet [id number] Yes Take 30 mg by mouth daily. May take a second 30 mg dose in the afternoon as needed for attention [provider] Taking Active Self  buPROPion  (WELLBUTRIN ) 100 MG tablet [id number] Yes Take 100-200 mg by mouth See admin instructions. Take 200 mg in the morning and 100 mg in the evening [provider] Taking Active Self  Calcium Citrate-Vitamin D (CALCIUM CITRATE CHEWY BITE PO) [id number] Yes Take 1 tablet by mouth 3 (three) times daily. [provider] Taking Active Self  CAPLYTA 42 MG capsule [id number] Yes Take 42 mg by mouth at bedtime. [provider] Taking Active Self  Coenzyme Q10 (COQ-10 PO) [id number] Yes Take 1 tablet by mouth daily. [provider] Taking Active Self  cyclobenzaprine  (FLEXERIL ) 10 MG tablet [id number] Yes Take 1 tablet (10 mg total) by mouth 3 (three) times daily as needed for muscle spasms. Raina Bunting, DO Taking Active   docusate sodium  (COLACE) 100 MG capsule [id number] Yes Take 1 capsule (100 mg total) by  mouth 2 (two) times daily as needed. Teresa Fender, MD Taking Active   estradiol  (CLIMARA ) 0.06 MG/24HR [id number] Yes Place 1 patch onto the skin once a week. Teresa Fender, MD Taking Active   furosemide  (LASIX ) 40 MG tablet [id number] Yes Take 40 mg by mouth as needed for edema (Takles as needed for swelling in legs/ankles/feet). [provider] Taking Active   Iron-Vitamin C (VITRON-C) 65-125 MG TABS [id number] Yes Take 1 tablet by mouth 2 (two) times daily. [provider] Taking Active Self  magic mouthwash w/lidocaine  SOLN [id number] Yes Swish, gargle, and spit one to two teaspoonfuls every six hours as needed. Shake well before using. Raina Bunting, DO Taking Active Self           Med Note Shann Darnel, KYLE A   Thu Aug 05, 2023  8:43 AM) Hasn't started  MAGNESIUM-POTASSIUM PO [id number] No Take 1 tablet by mouth daily.  Patient not taking: Reported on 09/22/2023   [provider] Not Taking Active Self  Multiple Vitamins-Minerals (BARIATRIC MULTIVITAMINS/IRON) CAPS [id number] Yes Take 1 capsule by mouth daily. [provider] Taking Active Self  nystatin (MYCOSTATIN/NYSTOP) powder [id number] Yes Apply 1 Application topically 2 (two) times daily as needed (rash). [provider] Taking Active Self  nystatin cream (MYCOSTATIN) [id number] Yes Apply 1 Application topically 2 (two) times daily as needed (rash). [provider] Taking Active Self  Omega-3  Fatty Acids (FISH OIL PO) [id number] Yes Take 1 capsule by mouth daily. [provider] Taking Active Self  ondansetron  (ZOFRAN ) 8 MG tablet [id number] Yes Take 8 mg by mouth every 8 (eight) hours as needed for nausea or vomiting. Pt has not started medication yet [provider] Taking Active Self           Med Note Elissa Guise   Wed Apr 28, 2023  9:18 AM)    oxyCODONE -acetaminophen  (PERCOCET/ROXICET) 5-325 MG tablet [id number] Yes Take 1 tablet by mouth every 4 (four) hours as  needed for severe pain (pain score 7-10). Raina Bunting, DO Taking Active   pantoprazole  (PROTONIX ) 40 MG tablet [id number] Yes Take 1 tablet (40 mg total) by mouth 2 (two) times daily before a meal. Romeo Co, Kayleen Party, DO Taking Active   Phenylephrine -Acetaminophen  (TYLENOL  SINUS CONGESTION/PAIN PO) [id number] Yes Take 2 tablets by mouth daily as needed (congestion). [provider] Taking Active Self            Recommendation:   PCP Follow-up Specialty provider follow-up as scheduled  Follow Up Plan:   Telephone follow-up 12/10/23 11am  Cyera Balboni, LCSW [place]  [facility], [facility] Licensed Clinical Social Worker Care Coordinator  Direct Dial: [phone]

## 2023-11-24 NOTE — Patient Instructions (Signed)
 Visit Information  Thank you for taking time to visit with me today. Please don't hesitate to contact me if I can be of assistance to you before our next scheduled appointment.  Our next appointment is by telephone on 12/10/23 at 11am Please call the care guide team at [phone] if you need to cancel or reschedule your appointment.   Following is a copy of your care plan:   Goals Addressed             This Visit's Progress    VBCI Social Work Care Plan-LCSW       Problems:   Lacks knowledge of how to connect to resources related to insurance coverage status  CSW Clinical Goal(s):   Over the next 90 days the Patient will attend all scheduled medical appointments as evidenced by patient report and care team review of appointment completion in electronic MEDICAL RECORD NUMBER  explore community resource options for unmet needs related to insurance coverage concerns and additional in home care support.  Interventions:  Social Determinants of Health in Patient with Chronic neck pain Cervical Foraminal Stenosis: SDOH assessments completed: Financial Strain  and challenges with confirming insurance coverage Evaluation of current treatment plan related to unmet needs Collaboration phone call to Medicaid-confirmed that patient will have full Medicaid coverage through 11/27/23. On 11/28/23 patient will have family planning only(confirmed that personal care services is not covered under the [facility] waiver Confirmed plan to contact Suzanne Wilcox-Medicaid worker to confirm [phone] Confirmed with patient that once Medicaid coverage is confirmed referral for personal care services can be discussed Confirmed that patient is active with Psychiatrist-Dr Deborra Falter for medication management-last appointment 11/23/23 will provide resources for ongoing mental health counseling once coverage is confirmed  Patient Goals/Self-Care Activities:  Follow up with Suzanne Wilcox- Department of Social  Services [phone] to confirm insurance coverage  Plan:   Telephone follow up appointment with care management team member scheduled for:  12/10/22 11am        Please call the Suicide and Crisis Lifeline: 988 if you are experiencing a Mental Health or Behavioral Health Crisis or need someone to talk to.  Patient verbalizes understanding of instructions and care plan provided today and agrees to view in MyChart. Active MyChart status and patient understanding of how to access instructions and care plan via MyChart confirmed with patient.     Suzanne Stranahan, LCSW [place]  [facility], [facility] Licensed Clinical Social Worker Care Coordinator  Direct Dial: [phone]

## 2023-11-30 ENCOUNTER — Telehealth: Payer: Self-pay

## 2023-11-30 DIAGNOSIS — Z1239 Encounter for other screening for malignant neoplasm of breast: Secondary | ICD-10-CM

## 2023-11-30 DIAGNOSIS — Z1501 Genetic susceptibility to malignant neoplasm of breast: Secondary | ICD-10-CM

## 2023-11-30 NOTE — Telephone Encounter (Signed)
 Ordered Breast MRI for screening. Thank you!  Domingo Friend, DO [facility] 11/30/2023, 11:37 AM

## 2023-11-30 NOTE — Addendum Note (Signed)
 Addended by: Raina Bunting on: 11/30/2023 11:37 AM   Modules accepted: Orders

## 2023-11-30 NOTE — Telephone Encounter (Signed)
 Copied from CRM [id number]. Topic: Clinical - Request for Lab/Test Order >> Nov 30, 2023  9:43 AM Crispin Dolphin wrote: Reason for CRM: Patient called states she needs order put int Bilateral Breast MRI - has to alternate between mammogram and this mri - due again. Thank You

## 2023-12-06 ENCOUNTER — Ambulatory Visit (INDEPENDENT_AMBULATORY_CARE_PROVIDER_SITE_OTHER): Admitting: Obstetrics & Gynecology

## 2023-12-06 ENCOUNTER — Telehealth: Payer: Self-pay

## 2023-12-06 VITALS — BP 139/80 | HR 95 | Ht 67.0 in | Wt 238.1 lb

## 2023-12-06 DIAGNOSIS — N951 Menopausal and female climacteric states: Secondary | ICD-10-CM | POA: Diagnosis not present

## 2023-12-06 DIAGNOSIS — M5430 Sciatica, unspecified side: Secondary | ICD-10-CM

## 2023-12-06 DIAGNOSIS — G8929 Other chronic pain: Secondary | ICD-10-CM

## 2023-12-06 DIAGNOSIS — M47816 Spondylosis without myelopathy or radiculopathy, lumbar region: Secondary | ICD-10-CM

## 2023-12-06 MED ORDER — ESTRADIOL 0.1 MG/24HR TD PTWK: 0.1000 mg | MEDICATED_PATCH | TRANSDERMAL | 12 refills | Status: AC

## 2023-12-06 MED ORDER — PREDNISONE 20 MG PO TABS
ORAL_TABLET | ORAL | 0 refills | Status: AC
Start: 2023-12-06 — End: ?

## 2023-12-06 NOTE — Telephone Encounter (Signed)
 Copied from CRM [id number]. Topic: Clinical - Medication Question >> Dec 06, 2023 10:55 AM Suzanne Wilcox wrote: Reason for CRM: Patient is stating she is having issues with her sciatica, it has been bothering her for the last 2 weeks. She has been taking OTC pain meds with little relief. Patient is wanting to know if Dr. Linnell Richardson can call in an oral steroid for her. Soonest appointment is for 06/11 and patient declined scheduling. Please advise, [phone]

## 2023-12-06 NOTE — Telephone Encounter (Signed)
 Please notify her that I will agree to send one Prednisone  taper x 7 days to her pharmacy this time. Since I we last saw her 1 month ago for same issue. No apt required at this time, but if anything else is needed we can follow-up  Domingo Friend, DO [facility] 12/06/2023, 12:46 PM

## 2023-12-06 NOTE — Progress Notes (Signed)
    GYNECOLOGY PROGRESS NOTE  Subjective:    Patient ID: Suzanne Wilcox, female    DOB: Nov 25, 1975, 48 y.o.   MRN: [id number]  HPI  Patient is a 48 y.o. divorced [id number] (7, 71, 57 yo kids) here today to discuss ERT. She had a TLH/BSO 08/11/2023 (for BRCA2 and DUB/adenomyosis). She was started on climara  0.05 mg right after surgery. She reports that on the day prior to her patch change her mood "plummets" including sobbing, worsening depressison. She denies SI/HI. This is a change for her otherwise stable mental health. So she increased her dose to 0.06 and this helped minimally. She would like to increase the dose again.  The following portions of the patient's history were reviewed and updated as appropriate: allergies, current medications, past family history, past medical history, past social history, past surgical history, and problem list.  Review of Systems Pertinent items are noted in HPI.  She has been monogamous for about 5 years. She has been sexually active in the last few weeks. She has to use lubricant and some discomfort.  Objective:   Blood pressure 139/80, pulse 95, height 5\' 7"  (1.702 m), weight 238 lb 1.6 oz (108 kg), last menstrual period 07/12/2023. Body mass index is 37.29 kg/m. Well nourished, well hydrated White female, no apparent distress She is ambulating and conversing normally.    Assessment:  Postmenopausal, surgical with symptoms of hypoestrogen  Plan:  Increase climara  patch to 0.1 mg

## 2023-12-06 NOTE — Addendum Note (Signed)
 Addended by: Raina Bunting on: 12/06/2023 12:46 PM   Modules accepted: Orders

## 2023-12-06 NOTE — Telephone Encounter (Signed)
 Patient notified prednisone  taper sent to pharmacy.

## 2023-12-07 DIAGNOSIS — N6481 Ptosis of breast: Secondary | ICD-10-CM | POA: Diagnosis not present

## 2023-12-08 ENCOUNTER — Other Ambulatory Visit: Payer: Self-pay

## 2023-12-08 ENCOUNTER — Other Ambulatory Visit

## 2023-12-08 NOTE — Patient Instructions (Addendum)
 Visit Information Thank you for allowing the Complex Care Management team to participate in your care. It was great speaking with you.  Your next care management appointment is by telephone on January 11, 2024 at 2:30pm Please do not hesitate to contact me if you require assistance prior to our next outreach or if you receive notification that you were approved to for [facility] Coverage.   If you are experiencing a Mental Health or Behavioral Health Crisis or need someone to talk to. Please  -Call the Suicide and Crisis Lifeline: 988 -Call 1-800-273-TALK (toll free, 24 hour hotline) -Call 911    Roxie Cord [facility] RN Care Manager Direct Dial: [phone]  Fax: [phone] Website: [email or web address]

## 2023-12-08 NOTE — Patient Outreach (Unsigned)
 Complex Care Management   Visit Note  12/08/2023  Name:  Suzanne Wilcox MRN: [id number] DOB: 05/27/1976  Situation: Referral received for Complex Care Management related to HTN, HLD, Depression and Bipolar Disorder. I obtained verbal consent from Patient.  Visit completed with Suzanne Wilcox via telephone.  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   Postoperative retention of urine 03/28/2016   Postpartum care following cesarean delivery (9/29) 03/27/2016   Sleep apnea    Termination of pregnancy (fetus) 03/22/2004   X 1   Vaginal Pap smear, abnormal        Assessment: Patient Reported Symptoms:  Cognitive Cognitive Status: Alert and oriented to person, place, and time Behavior Disorders: Reports episodes of difficulty concentrating due to ADHD Health Maintenance Behaviors: Annual physical exam, Stress management Healing Pattern: Average Health Facilitated by: Rest, Stress management  Neurological Neurological Review of Symptoms: No symptoms reported  HEENT HEENT Symptoms Reported: No symptoms reported  Cardiovascular Cardiovascular Symptoms Reported: No symptoms reported  Respiratory Respiratory Symptoms Reported: No symptoms reported  Endocrine Patient reports the following symptoms related to hypoglycemia or hyperglycemia : No symptoms reported  Gastrointestinal Gastrointestinal Symptoms Reported: Other Other  Gastrointestinal Symptoms: Chronic Constipation/Reports managed with stool softner  Genitourinary Genitourinary Symptoms Reported: No symptoms reported  Integumentary Integumentary Symptoms Reported: No symptoms reported  Musculoskeletal Musculoskelatal Symptoms Reviewed: Difficulty walking Other Musculoskeletal Symptoms: Reports difficulty ambulating which has been a chronic issue. Reports chronic neck, joint and back pain.  Psychosocial Psychosocial Symptoms Reported: Depression - if selected complete PHQ 2-9 Behavioral Health Conditions: Bipolar affective disorder, Post traumatic stress Behavioral Management Strategies: Coping strategies, Medication therapy Behavioral Health Self-Management Outcome: 3 (uncertain) Behavioral Health Comment: Patient reports currently managing with medication and Psychiatry Major Change/Loss/Stressor/Fears (CP): Medical condition, self Techniques to Cope with Loss/Stress/Change: Diversional activities, Meditation Quality of Family Relationships: supportive Do you feel physically threatened by others?: No      12/08/2023   10:21 AM  Depression screen PHQ 2/9  Decreased Interest 1  Down, Depressed, Hopeless 1  PHQ - 2 Score 2  Altered sleeping 0  Tired, decreased energy 1  Change in appetite 0  Feeling bad or failure about yourself  1  Trouble concentrating 3  Moving slowly or fidgety/restless 0  Suicidal thoughts 0  PHQ-9 Score 7  Difficult doing work/chores Very difficult    There were no vitals filed for this visit.  Medications Reviewed Today     Reviewed by Roxie Cord, RN (Registered Nurse) on 12/08/23 at [phone]  Med List Status: <None>   Medication Order Taking? Sig Documenting Provider Last Dose Status Informant  acetaminophen  (TYLENOL ) 500 MG tablet [id number]  Take 1 tablet (500 mg total) by mouth every 6 (six) hours as needed.  Teresa Fender, MD  Active   acetaminophen -caffeine (EXCEDRIN TENSION HEADACHE) 500-65 MG TABS per tablet  [id number]  Take 1 tablet by mouth daily. May take another 2 tablet dose throughout the day as needed for a headache [provider]  Active Self  ALPRAZolam (XANAX) 1 MG tablet [id number] Yes Take 1 mg by mouth 3 (three) times daily as needed for anxiety. [provider] Taking Active Self  amLODipine  (NORVASC ) 5 MG tablet [id number] Yes TAKE 1 TABLET(5 MG) BY MOUTH DAILY Karamalegos, Kayleen Party, DO Taking Active   amphetamine-dextroamphetamine (ADDERALL) 30 MG tablet [id number] Yes Take 30 mg by mouth daily. May take a second 30 mg dose in the afternoon as needed for attention [provider] Taking Active Self  buPROPion  (WELLBUTRIN ) 100 MG tablet [id number] Yes Take 100-200 mg by mouth See admin instructions. Take 200 mg in the morning and 100 mg in the evening [provider] Taking Active Self  Calcium Citrate-Vitamin D (CALCIUM CITRATE CHEWY BITE PO) [id number]  Take 1 tablet by mouth 3 (three) times daily. [provider]  Active Self  CAPLYTA 42 MG capsule [id number] Yes Take 42 mg by mouth at bedtime. [provider] Taking Active Self  Coenzyme Q10 (COQ-10 PO) [id number]  Take 1 tablet by mouth daily. [provider]  Active Self  cyclobenzaprine  (FLEXERIL ) 10 MG tablet [id number] Yes Take 1 tablet (10 mg total) by mouth 3 (three) times daily as needed for muscle spasms. Raina Bunting, DO Taking Active   docusate sodium  (COLACE) 100 MG capsule [id number] Yes Take 1 capsule (100 mg total) by mouth 2 (two) times daily as needed. Teresa Fender, MD Taking Active   estradiol  (CLIMARA ) 0.1 mg/24hr patch [id number] Yes Place 1 patch (0.1 mg total) onto the skin once a week. Ana Balling, MD Taking Active   furosemide  (LASIX ) 40 MG tablet [id number] Yes Take 40 mg by mouth as needed for edema (Takles as needed for swelling in legs/ankles/feet). [provider] Taking Active   Iron-Vitamin C (VITRON-C) 65-125 MG TABS [id number] Yes  Take 1 tablet by mouth 2 (two) times daily. [provider] Taking Active Self  magic mouthwash w/lidocaine  SOLN [id number] No Swish, gargle, and spit one to two teaspoonfuls every six hours as needed. Shake well before using.  Patient not taking: Reported on 12/08/2023   Raina Bunting, DO Not Taking Active Self           Med Note Shann Darnel, [place] A   Thu Aug 05, 2023  8:43 AM) Hasn't started  MAGNESIUM-POTASSIUM PO [id number] No Take 1 tablet by mouth daily.  Patient not taking: Reported on 09/22/2023   [provider] Not Taking Active Self  Multiple Vitamins-Minerals (BARIATRIC MULTIVITAMINS/IRON) CAPS [id number] Yes Take 1 capsule by mouth daily. [provider] Taking Active Self  nystatin (MYCOSTATIN/NYSTOP) powder [id number]  Apply 1 Application topically 2 (two) times daily as needed (rash). [provider]  Active Self  nystatin cream (MYCOSTATIN) [id number]  Apply 1 Application topically 2 (two) times daily as needed (rash). [provider]  Active Self  Omega-3 Fatty Acids (FISH OIL PO) [id number]  Take 1 capsule by mouth daily. [provider]  Active Self  ondansetron  (ZOFRAN ) 8 MG tablet [id number] Yes Take 8 mg by mouth every 8 (eight) hours as needed for nausea or vomiting. Pt has not started medication yet [provider] Taking Active Self           Med Note Tommas Fragmin, [facility]  Wed Apr 28, 2023  9:18 AM)    oxyCODONE -acetaminophen  (PERCOCET/ROXICET) 5-325 MG tablet [id number] Yes Take 1 tablet by mouth every 4 (four) hours as needed for severe pain (pain score 7-10). Raina Bunting, DO Taking Active   pantoprazole  (PROTONIX ) 40 MG tablet [id number] Yes Take 1 tablet (40 mg total) by mouth 2 (two) times daily before a meal. Romeo Co, Kayleen Party, DO Taking Active   Phenylephrine -Acetaminophen  (TYLENOL  SINUS CONGESTION/PAIN PO) [id number]  Take 2 tablets by mouth daily as needed (congestion). [provider]  Active Self  predniSONE  (DELTASONE ) 20 MG tablet [id number] Yes Take daily with food. Start with 60mg  (3 pills) x 2 days, then reduce to 40mg  (2 pills) x 2 days, then 20mg  (1 pill) x 3 days Raina Bunting, DO Taking Active             Recommendation:   Continue Current Plan of Care  Follow Up Plan:   Telephone follow up  with Nurse Case Manager on January 10, 2024   Roxie Cord [facility] RN Care Manager Direct Dial: [phone]  Fax: [phone] Website: [email or web address]

## 2023-12-10 ENCOUNTER — Other Ambulatory Visit: Payer: Self-pay | Admitting: *Deleted

## 2023-12-12 ENCOUNTER — Encounter: Payer: Self-pay | Admitting: Family Medicine

## 2023-12-12 DIAGNOSIS — M5020 Other cervical disc displacement, unspecified cervical region: Secondary | ICD-10-CM

## 2023-12-12 DIAGNOSIS — M5412 Radiculopathy, cervical region: Secondary | ICD-10-CM

## 2023-12-12 DIAGNOSIS — G8929 Other chronic pain: Secondary | ICD-10-CM

## 2023-12-13 MED ORDER — OXYCODONE-ACETAMINOPHEN 5-325 MG PO TABS: 1.0000 | ORAL_TABLET | ORAL | 0 refills | Status: DC | PRN

## 2023-12-13 NOTE — Patient Instructions (Signed)
 Visit Information  Thank you for taking time to visit with me today. Please don't hesitate to contact me if I can be of assistance to you before our next scheduled appointment.  Your next care management appointment is by telephone on 12/27/23 at 9am  Telephone follow-up 2 weeks  Please call the care guide team at [phone] if you need to cancel, schedule, or reschedule an appointment.   Please call the Suicide and Crisis Lifeline: 988 if you are experiencing a Mental Health or Behavioral Health Crisis or need someone to talk to.  Lonette Stevison, LCSW [place]  [facility], [facility] Licensed Clinical Social Worker  Direct Dial: [phone]

## 2023-12-13 NOTE — Patient Outreach (Addendum)
 Complex Care Management   Visit Note  12/13/2023  Name:  Suzanne Wilcox MRN: [id number] DOB: Nov 18, 1975  Situation: Referral received for Complex Care Management related to Financial strain and need for in home aid support due to Chronic neck pain Cervical Foraminal Stenosis I obtained verbal consent from Patient.  Visit completed with patient  on the phone on 12/10/23  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   Postoperative retention of urine 03/28/2016   Postpartum care following cesarean delivery (9/29) 03/27/2016   Sleep apnea    Termination of pregnancy (fetus) 03/22/2004   X 1   Vaginal Pap smear, abnormal     Assessment: Patient Reported Symptoms:  Cognitive Cognitive Status: Alert and oriented to person, place, and time, Insightful and able to interpret abstract concepts, Normal speech and language skills Cognitive/Intellectual Conditions Management [RPT]: Autism Spectrum Disorder   Health Maintenance Behaviors: Annual physical exam, Stress management Healing Pattern: Average Health Facilitated by: Rest, Stress management  Neurological Neurological Review of Symptoms: No symptoms reported    HEENT HEENT Symptoms Reported: No symptoms reported      Cardiovascular Cardiovascular Symptoms Reported: No symptoms reported    Respiratory Respiratory Symptoms Reported: No symptoms reported    Endocrine Patient reports the  following symptoms related to hypoglycemia or hyperglycemia : No symptoms reported    Gastrointestinal Gastrointestinal Symptoms Reported: Constipation Additional Gastrointestinal Details: continues to take miralax daily      Genitourinary Genitourinary Symptoms Reported: No symptoms reported    Integumentary Integumentary Symptoms Reported: No symptoms reported    Musculoskeletal Musculoskelatal Symptoms Reviewed: Difficulty walking Other Musculoskeletal Symptoms: due to increased pain cannot walk  or bend down Musculoskeletal Conditions: Back pain Musculoskeletal Management Strategies: Medication therapy      Psychosocial Psychosocial Symptoms Reported: Depression - if selected complete PHQ 2-9 Behavioral Health Conditions: Bipolar affective disorder, Post traumatic stress Behavioral Management Strategies: Medication therapy Behavioral Health Comment: Continues to be followed by Psychiatry Dr. Deborra Falter -last seen two weeks ago and will continue to follow. Patient will follow up with her insurance company for a list of in network providers Major Change/Loss/Stressor/Fears (CP): Medical condition, self Techniques to [employer] with Loss/Stress/Change: Diversional activities, Medication Quality of Family Relationships: supportive Do you feel physically threatened by others?: No      12/08/2023   10:21 AM  Depression screen PHQ 2/9  Decreased Interest 1  Down, Depressed, Hopeless 1  PHQ - 2 Score 2  Altered sleeping 0  Tired, decreased energy 1  Change in appetite 0  Feeling bad or failure about yourself  1  Trouble concentrating 3  Moving slowly or fidgety/restless 0  Suicidal thoughts 0  PHQ-9 Score 7  Difficult doing work/chores Very difficult    There  were no vitals filed for this visit.  Medications Reviewed Today     Reviewed by Ave Leisure, LCSW (Social Worker) on 12/10/23 at 1118  Med List Status: <None>   Medication Order Taking? Sig Documenting Provider Last Dose  Status Informant  acetaminophen  (TYLENOL ) 500 MG tablet [id number]  Take 1 tablet (500 mg total) by mouth every 6 (six) hours as needed. Teresa Fender, MD  Active   acetaminophen -caffeine (EXCEDRIN TENSION HEADACHE) 500-65 MG TABS per tablet [id number]  Take 1 tablet by mouth daily. May take another 2 tablet dose throughout the day as needed for a headache [provider]  Active Self  ALPRAZolam (XANAX) 1 MG tablet [id number]  Take 1 mg by mouth 3 (three) times daily as needed for anxiety. [provider]  Active Self  amLODipine  (NORVASC ) 5 MG tablet [id number]  TAKE 1 TABLET(5 MG) BY MOUTH DAILY Karamalegos, Kayleen Party, DO  Active   amphetamine-dextroamphetamine (ADDERALL) 30 MG tablet [id number]  Take 30 mg by mouth daily. May take a second 30 mg dose in the afternoon as needed for attention [provider]  Active Self  buPROPion  (WELLBUTRIN ) 100 MG tablet [id number]  Take 100-200 mg by mouth See admin instructions. Take 200 mg in the morning and 100 mg in the evening [provider]  Active Self  Calcium Citrate-Vitamin D (CALCIUM CITRATE CHEWY BITE PO) [id number]  Take 1 tablet by mouth 3 (three) times daily. [provider]  Active Self  CAPLYTA 42 MG capsule [id number]  Take 42 mg by mouth at bedtime. [provider]  Active Self  Coenzyme Q10 (COQ-10 PO) [id number]  Take 1 tablet by mouth daily. [provider]  Active Self  cyclobenzaprine  (FLEXERIL ) 10 MG tablet [id number]  Take 1 tablet (10 mg total) by mouth 3 (three) times daily as needed for muscle spasms. Raina Bunting, DO  Active   docusate sodium  (COLACE) 100 MG capsule [id number] Yes Take 1 capsule (100 mg total) by mouth 2 (two) times daily as needed. Teresa Fender, MD  Active   estradiol  (CLIMARA ) 0.1 mg/24hr patch [id number] Yes Place 1 patch (0.1 mg total) onto the skin once a week. Dove, Myra C, MD  Active   furosemide  (LASIX ) 40 MG tablet [id number] Yes Take 40 mg by  mouth as needed for edema (Takles as needed for swelling in legs/ankles/feet). [provider]  Active   Iron-Vitamin C (VITRON-C) 65-125 MG TABS [id number] Yes Take 1 tablet by mouth 2 (two) times daily. [provider]  Active Self  magic mouthwash w/lidocaine  SOLN [id number]  Swish, gargle, and spit one to two teaspoonfuls every six hours as needed. Shake well before using.  Patient not taking: Reported on 12/10/2023   Raina Bunting, DO  Active Self           Med Note Shann Darnel, [place] A   Thu Aug 05, 2023  8:43 AM) Hasn't started  MAGNESIUM-POTASSIUM PO [id number]  Take 1 tablet by mouth daily.  Patient not taking: Reported on 12/10/2023   [provider]  Active Self  Multiple Vitamins-Minerals (BARIATRIC MULTIVITAMINS/IRON) CAPS [id number] Yes Take 1 capsule by mouth daily. [provider]  Active Self  nystatin (MYCOSTATIN/NYSTOP) powder [id number] Yes Apply 1 Application topically 2 (two) times daily as needed (rash). [provider]  Active Self  nystatin cream (MYCOSTATIN) [id number] Yes Apply 1 Application topically 2 (two) times daily as needed (rash). [provider]  Active Self  Omega-3 Fatty Acids (FISH OIL  PO) [id number] Yes Take 1 capsule by mouth daily. [provider]  Active Self  ondansetron  (ZOFRAN ) 8 MG tablet [id number] Yes Take 8 mg by mouth every 8 (eight) hours as needed for nausea or vomiting. Pt has not started medication yet [provider]  Active Self           Med Note Elissa Guise   Wed Apr 28, 2023  9:18 AM)    oxyCODONE -acetaminophen  (PERCOCET/ROXICET) 5-325 MG tablet [id number] Yes Take 1 tablet by mouth every 4 (four) hours as needed for severe pain (pain score 7-10). Raina Bunting, DO  Active   pantoprazole  (PROTONIX ) 40 MG tablet [id number] Yes Take 1 tablet (40 mg total) by mouth 2 (two) times daily before a meal. Karamalegos, Kayleen Party, DO  Active    Phenylephrine -Acetaminophen  (TYLENOL  SINUS CONGESTION/PAIN PO) [id number] Yes Take 2 tablets by mouth daily as needed (congestion). [provider]  Active Self  predniSONE  (DELTASONE ) 20 MG tablet [id number] Yes Take daily with food. Start with 60mg  (3 pills) x 2 days, then reduce to 40mg  (2 pills) x 2 days, then 20mg  (1 pill) x 3 days Raina Bunting, DO  Active             Recommendation:   PCP Follow-up Specialty provider follow-up as scheduled CSW will contact provider to complete request form for an assessment for personal care services  Follow Up Plan:   Telephone follow up appointment date/time:  12/27/23  Michaelle Adolphus, LCSW [place]  [facility], [facility] Licensed Clinical Social Worker  Direct Dial: [phone]

## 2023-12-15 DIAGNOSIS — M546 Pain in thoracic spine: Secondary | ICD-10-CM | POA: Diagnosis not present

## 2023-12-15 DIAGNOSIS — M9902 Segmental and somatic dysfunction of thoracic region: Secondary | ICD-10-CM | POA: Diagnosis not present

## 2023-12-15 DIAGNOSIS — M5412 Radiculopathy, cervical region: Secondary | ICD-10-CM | POA: Diagnosis not present

## 2023-12-15 DIAGNOSIS — M9901 Segmental and somatic dysfunction of cervical region: Secondary | ICD-10-CM | POA: Diagnosis not present

## 2023-12-15 DIAGNOSIS — Z9189 Other specified personal risk factors, not elsewhere classified: Secondary | ICD-10-CM | POA: Diagnosis not present

## 2023-12-16 ENCOUNTER — Encounter: Payer: Self-pay | Admitting: Family Medicine

## 2023-12-16 DIAGNOSIS — M545 Low back pain, unspecified: Secondary | ICD-10-CM | POA: Insufficient documentation

## 2023-12-16 DIAGNOSIS — G8929 Other chronic pain: Secondary | ICD-10-CM | POA: Insufficient documentation

## 2023-12-17 DIAGNOSIS — M546 Pain in thoracic spine: Secondary | ICD-10-CM | POA: Diagnosis not present

## 2023-12-17 DIAGNOSIS — M9902 Segmental and somatic dysfunction of thoracic region: Secondary | ICD-10-CM | POA: Diagnosis not present

## 2023-12-17 DIAGNOSIS — M9901 Segmental and somatic dysfunction of cervical region: Secondary | ICD-10-CM | POA: Diagnosis not present

## 2023-12-17 DIAGNOSIS — M5412 Radiculopathy, cervical region: Secondary | ICD-10-CM | POA: Diagnosis not present

## 2023-12-20 DIAGNOSIS — M9902 Segmental and somatic dysfunction of thoracic region: Secondary | ICD-10-CM | POA: Diagnosis not present

## 2023-12-20 DIAGNOSIS — M546 Pain in thoracic spine: Secondary | ICD-10-CM | POA: Diagnosis not present

## 2023-12-20 DIAGNOSIS — M5412 Radiculopathy, cervical region: Secondary | ICD-10-CM | POA: Diagnosis not present

## 2023-12-20 DIAGNOSIS — M9901 Segmental and somatic dysfunction of cervical region: Secondary | ICD-10-CM | POA: Diagnosis not present

## 2023-12-22 DIAGNOSIS — M9901 Segmental and somatic dysfunction of cervical region: Secondary | ICD-10-CM | POA: Diagnosis not present

## 2023-12-22 DIAGNOSIS — M9902 Segmental and somatic dysfunction of thoracic region: Secondary | ICD-10-CM | POA: Diagnosis not present

## 2023-12-22 DIAGNOSIS — M546 Pain in thoracic spine: Secondary | ICD-10-CM | POA: Diagnosis not present

## 2023-12-22 DIAGNOSIS — M5412 Radiculopathy, cervical region: Secondary | ICD-10-CM | POA: Diagnosis not present

## 2023-12-24 DIAGNOSIS — M5412 Radiculopathy, cervical region: Secondary | ICD-10-CM | POA: Diagnosis not present

## 2023-12-24 DIAGNOSIS — M9902 Segmental and somatic dysfunction of thoracic region: Secondary | ICD-10-CM | POA: Diagnosis not present

## 2023-12-24 DIAGNOSIS — M546 Pain in thoracic spine: Secondary | ICD-10-CM | POA: Diagnosis not present

## 2023-12-24 DIAGNOSIS — M9901 Segmental and somatic dysfunction of cervical region: Secondary | ICD-10-CM | POA: Diagnosis not present

## 2023-12-27 ENCOUNTER — Other Ambulatory Visit: Payer: Self-pay | Admitting: *Deleted

## 2023-12-27 DIAGNOSIS — M5412 Radiculopathy, cervical region: Secondary | ICD-10-CM | POA: Diagnosis not present

## 2023-12-27 DIAGNOSIS — M9902 Segmental and somatic dysfunction of thoracic region: Secondary | ICD-10-CM | POA: Diagnosis not present

## 2023-12-27 DIAGNOSIS — M546 Pain in thoracic spine: Secondary | ICD-10-CM | POA: Diagnosis not present

## 2023-12-27 DIAGNOSIS — M9901 Segmental and somatic dysfunction of cervical region: Secondary | ICD-10-CM | POA: Diagnosis not present

## 2023-12-27 NOTE — Patient Instructions (Signed)
 Visit Information  Thank you for taking time to visit with me today. Please don't hesitate to contact me if I can be of assistance to you before our next scheduled appointment.  Your next care management appointment is by telephone on 01/31/24 at 10am  Telephone follow-up 01/31/24  Please call the care guide team at [phone] if you need to cancel, schedule, or reschedule an appointment.   Please call 911 if you are experiencing a Mental Health or Behavioral Health Crisis or need someone to talk to.  Telia Amundson, LCSW [place]  [facility], [facility] Licensed Clinical Social Worker  Direct Dial: [phone]

## 2023-12-27 NOTE — Patient Outreach (Signed)
 Complex Care Management   Visit Note  12/27/2023  Name:  Suzanne Wilcox MRN: [id number] DOB: 16-Apr-1976  Situation: Referral received for Complex Care Management related to Financial strain and need for in home aid support due to Chronic neck pain Cervical Foraminal Stenosis I obtained verbal consent from Patient. 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   Postoperative retention of urine 03/28/2016   Postpartum care following cesarean delivery (9/29) 03/27/2016   Sleep apnea    Termination of pregnancy (fetus) 03/22/2004   X 1   Vaginal Pap smear, abnormal     Assessment: Patient Reported Symptoms:  Cognitive Cognitive Status: Alert and oriented to person, place, and time, Insightful and able to interpret abstract concepts, Normal speech and language skills Cognitive/Intellectual Conditions Management [RPT]: Autism Spectrum Disorder   Health Maintenance Behaviors: Annual physical exam, Stress management Healing Pattern: Average Health Facilitated by: Rest, Stress management  Neurological Neurological Review of Symptoms: No symptoms reported    HEENT HEENT Symptoms Reported: No symptoms reported      Cardiovascular Cardiovascular Symptoms Reported: No symptoms reported    Respiratory      Endocrine Endocrine Symptoms Reported: No symptoms reported    Gastrointestinal  Gastrointestinal Symptoms Reported: Constipation Other Gastrointestinal Symptoms: Constipation-uses miralax daily      Genitourinary Genitourinary Symptoms Reported: No symptoms reported    Integumentary Integumentary Symptoms Reported: No symptoms reported    Musculoskeletal Musculoskelatal Symptoms Reviewed: Difficulty walking Other Musculoskeletal Symptoms: reports having pain in back and neck currently being followed by a chiropractor Musculoskeletal Management Strategies: Medication therapy, Routine screening Musculoskeletal Comment: currently followed by a chiropractor   Patient at Risk for Falls Due to: Impaired mobility Fall risk Follow up: Falls prevention discussed  Psychosocial Psychosocial Symptoms Reported: Depression - if selected complete PHQ 2-9 Behavioral Management Strategies: Medication therapy Behavioral Health Comment: Continues to be followed by Psychiatry-Dr. Vincente Major Change/Loss/Stressor/Fears (CP): Medical condition, self Techniques to Cope with Loss/Stress/Change: Diversional activities, Medication Quality of Family Relationships: supportive Do you feel physically threatened by others?: No      12/08/2023   10:21 AM  Depression screen PHQ 2/9  Decreased Interest 1  Down, Depressed, Hopeless 1  PHQ - 2 Score 2  Altered sleeping 0  Tired, decreased energy 1  Change in appetite 0  Feeling bad or failure about yourself  1  Trouble concentrating 3  Moving slowly or fidgety/restless 0  Suicidal thoughts 0  PHQ-9 Score 7  Difficult doing work/chores Very difficult    There were no vitals filed for this visit.  Medications Reviewed Today   Medications were not reviewed in this encounter     Recommendation:   PCP Follow-up Specialty provider  follow-up as scheduled  Follow Up Plan:   Telephone follow-up in 1 month   Jamieka Royle, LCSW [facility]  [employer] [facility], [facility] Licensed Clinical Social Worker  Direct Dial:  [phone]

## 2023-12-29 DIAGNOSIS — M546 Pain in thoracic spine: Secondary | ICD-10-CM | POA: Diagnosis not present

## 2023-12-29 DIAGNOSIS — M9901 Segmental and somatic dysfunction of cervical region: Secondary | ICD-10-CM | POA: Diagnosis not present

## 2023-12-29 DIAGNOSIS — M9902 Segmental and somatic dysfunction of thoracic region: Secondary | ICD-10-CM | POA: Diagnosis not present

## 2023-12-29 DIAGNOSIS — M5412 Radiculopathy, cervical region: Secondary | ICD-10-CM | POA: Diagnosis not present

## 2024-01-03 DIAGNOSIS — M9901 Segmental and somatic dysfunction of cervical region: Secondary | ICD-10-CM | POA: Diagnosis not present

## 2024-01-03 DIAGNOSIS — M5412 Radiculopathy, cervical region: Secondary | ICD-10-CM | POA: Diagnosis not present

## 2024-01-03 DIAGNOSIS — M9902 Segmental and somatic dysfunction of thoracic region: Secondary | ICD-10-CM | POA: Diagnosis not present

## 2024-01-03 DIAGNOSIS — M546 Pain in thoracic spine: Secondary | ICD-10-CM | POA: Diagnosis not present

## 2024-01-05 DIAGNOSIS — M9901 Segmental and somatic dysfunction of cervical region: Secondary | ICD-10-CM | POA: Diagnosis not present

## 2024-01-05 DIAGNOSIS — M5412 Radiculopathy, cervical region: Secondary | ICD-10-CM | POA: Diagnosis not present

## 2024-01-05 DIAGNOSIS — M546 Pain in thoracic spine: Secondary | ICD-10-CM | POA: Diagnosis not present

## 2024-01-05 DIAGNOSIS — M9902 Segmental and somatic dysfunction of thoracic region: Secondary | ICD-10-CM | POA: Diagnosis not present

## 2024-01-07 DIAGNOSIS — M5412 Radiculopathy, cervical region: Secondary | ICD-10-CM | POA: Diagnosis not present

## 2024-01-07 DIAGNOSIS — M9902 Segmental and somatic dysfunction of thoracic region: Secondary | ICD-10-CM | POA: Diagnosis not present

## 2024-01-07 DIAGNOSIS — M546 Pain in thoracic spine: Secondary | ICD-10-CM | POA: Diagnosis not present

## 2024-01-07 DIAGNOSIS — M9901 Segmental and somatic dysfunction of cervical region: Secondary | ICD-10-CM | POA: Diagnosis not present

## 2024-01-10 ENCOUNTER — Other Ambulatory Visit: Payer: Self-pay

## 2024-01-10 DIAGNOSIS — M546 Pain in thoracic spine: Secondary | ICD-10-CM | POA: Diagnosis not present

## 2024-01-10 DIAGNOSIS — M5412 Radiculopathy, cervical region: Secondary | ICD-10-CM | POA: Diagnosis not present

## 2024-01-10 DIAGNOSIS — M9901 Segmental and somatic dysfunction of cervical region: Secondary | ICD-10-CM | POA: Diagnosis not present

## 2024-01-10 DIAGNOSIS — M9902 Segmental and somatic dysfunction of thoracic region: Secondary | ICD-10-CM | POA: Diagnosis not present

## 2024-01-10 NOTE — Patient Instructions (Signed)
Thank you for allowing the Chronic Care Management team to participate in your care.  

## 2024-01-10 NOTE — Patient Outreach (Unsigned)
 Complex Care Management   Visit Note  01/10/2024  Name:  Suzanne Wilcox MRN: [id number] DOB: 03/08/1976  Situation: Referral received for Complex Care Management related to {Criteria:32550} I obtained verbal consent from {CHL AMB Patient/Caregiver:28184}.  Visit completed with ***  {VISIT LOCATION:32553}  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   Postoperative retention of urine 03/28/2016   Postpartum care following cesarean delivery (9/29) 03/27/2016   Sleep apnea    Termination of pregnancy (fetus) 03/22/2004   X 1   Vaginal Pap smear, abnormal     Assessment: Patient Reported Symptoms:  Cognitive        Neurological      HEENT        Cardiovascular      Respiratory      Endocrine      Gastrointestinal        Genitourinary      Integumentary      Musculoskeletal          Psychosocial              12/08/2023   10:21 AM  Depression screen PHQ 2/9  Decreased Interest 1  Down, Depressed, Hopeless 1  PHQ - 2 Score 2  Altered sleeping 0  Tired, decreased energy 1  Change in appetite 0  Feeling bad or failure about yourself  1  Trouble concentrating 3  Moving slowly or fidgety/restless 0  Suicidal thoughts 0  PHQ-9 Score 7  Difficult doing work/chores Very difficult    There were no vitals filed for this visit.  Medications Reviewed Today     Reviewed by Karoline Lima, RN (Registered  Nurse) on 01/10/24 at 2314  Med List Status: <None>   Medication Order Taking? Sig Documenting Provider Last Dose Status Informant  acetaminophen  (TYLENOL ) 500 MG tablet [id number] No Take 1 tablet (500 mg total) by mouth every 6 (six) hours as needed. Connell Davies, MD Taking Active   acetaminophen -caffeine (EXCEDRIN TENSION HEADACHE) 500-65 MG TABS per tablet [id number] No Take 1 tablet by mouth daily. May take another 2 tablet dose throughout the day as needed for a headache [provider] Taking Active Self  ALPRAZolam (XANAX) 1 MG tablet [id number] No Take 1 mg by mouth 3 (three) times daily as needed for anxiety. [provider] Taking Active Self  amLODipine  (NORVASC ) 5 MG tablet [id number] No TAKE 1 TABLET(5 MG) BY MOUTH DAILY Karamalegos, Marsa PARAS, DO Taking Active   amphetamine-dextroamphetamine (ADDERALL) 30 MG tablet [id number] No Take 30 mg by mouth daily. May take a second 30 mg dose in the afternoon as needed for attention [provider] Taking Active Self  buPROPion  (WELLBUTRIN ) 100 MG tablet [id number] No Take 100-200 mg by mouth See admin instructions.  Take 200 mg in the morning and 100 mg in the evening [provider] Taking Active Self  Calcium Citrate-Vitamin D (CALCIUM CITRATE CHEWY BITE PO) [id number] No Take 1 tablet by mouth 3 (three) times daily. [provider] Taking Active Self  CAPLYTA 42 MG capsule [id number] No Take 42 mg by mouth at bedtime. [provider] Taking Active Self  Coenzyme Q10 (COQ-10 PO) [id number] No Take 1 tablet by mouth daily. [provider] Taking Active Self  cyclobenzaprine  (FLEXERIL ) 10 MG tablet [id number] No Take 1 tablet (10 mg total) by mouth 3 (three) times daily as needed for muscle spasms. Edman Marsa PARAS, DO Taking Active   docusate sodium  (COLACE) 100 MG capsule [id number]  Take 1 capsule (100 mg total) by mouth 2 (two) times daily as needed. Connell Davies, MD  Active    estradiol  (CLIMARA ) 0.1 mg/24hr patch [id number]  Place 1 patch (0.1 mg total) onto the skin once a week. Starla Harland BROCKS, MD  Active   furosemide  (LASIX ) 40 MG tablet [id number]  Take 40 mg by mouth as needed for edema ([place] as needed for swelling in legs/ankles/feet). [provider]  Active   Iron-Vitamin C (VITRON-C) 65-125 MG TABS [id number]  Take 1 tablet by mouth 2 (two) times daily. [provider]  Active Self  magic mouthwash w/lidocaine  SOLN [id number]  Swish, gargle, and spit one to two teaspoonfuls every six hours as needed. Shake well before using.  Patient not taking: Reported on 12/10/2023   Edman Marsa PARAS, DO  Active Self           Med Note JERALYN, FLORIDA A   Thu Aug 05, 2023  8:43 AM) Hasn't started  MAGNESIUM-POTASSIUM PO [id number]  Take 1 tablet by mouth daily.  Patient not taking: Reported on 12/10/2023   [provider]  Active Self  Multiple Vitamins-Minerals (BARIATRIC MULTIVITAMINS/IRON) CAPS [id number]  Take 1 capsule by mouth daily. [provider]  Active Self  nystatin (MYCOSTATIN/NYSTOP) powder [id number]  Apply 1 Application topically 2 (two) times daily as needed (rash). [provider]  Active Self  nystatin cream (MYCOSTATIN) [id number]  Apply 1 Application topically 2 (two) times daily as needed (rash). [provider]  Active Self  Omega-3 Fatty Acids (FISH OIL PO) [id number]  Take 1 capsule by mouth daily. [provider]  Active Self  ondansetron  (ZOFRAN ) 8 MG tablet [id number]  Take 8 mg by mouth every 8 (eight) hours as needed for nausea or vomiting. Pt has not started medication yet [provider]  Active Self           Med Note NORVEL KIRSCH   Wed Apr 28, 2023  9:18 AM)    oxyCODONE -acetaminophen  (PERCOCET/ROXICET) 5-325 MG tablet [id number]  Take 1 tablet by mouth every 4 (four) hours as needed for severe pain (pain score 7-10). Edman Marsa PARAS, DO  Active   pantoprazole   (PROTONIX ) 40 MG tablet [id number]  Take 1 tablet (40 mg total) by mouth 2 (two) times daily before a meal. Karamalegos, Marsa PARAS, DO  Active   Phenylephrine -Acetaminophen  (TYLENOL  SINUS CONGESTION/PAIN PO) [id number]  Take 2 tablets by mouth daily as needed (congestion). [provider]  Active Self            Recommendation:   {RECOMMENDATONS:32554}  Follow Up Plan:   {FOLLOWUP:32559}  SIG ***

## 2024-01-12 DIAGNOSIS — M9901 Segmental and somatic dysfunction of cervical region: Secondary | ICD-10-CM | POA: Diagnosis not present

## 2024-01-12 DIAGNOSIS — M9902 Segmental and somatic dysfunction of thoracic region: Secondary | ICD-10-CM | POA: Diagnosis not present

## 2024-01-12 DIAGNOSIS — M546 Pain in thoracic spine: Secondary | ICD-10-CM | POA: Diagnosis not present

## 2024-01-12 DIAGNOSIS — N6481 Ptosis of breast: Secondary | ICD-10-CM | POA: Diagnosis not present

## 2024-01-12 DIAGNOSIS — M5412 Radiculopathy, cervical region: Secondary | ICD-10-CM | POA: Diagnosis not present

## 2024-01-17 DIAGNOSIS — M5412 Radiculopathy, cervical region: Secondary | ICD-10-CM | POA: Diagnosis not present

## 2024-01-17 DIAGNOSIS — M9902 Segmental and somatic dysfunction of thoracic region: Secondary | ICD-10-CM | POA: Diagnosis not present

## 2024-01-17 DIAGNOSIS — M546 Pain in thoracic spine: Secondary | ICD-10-CM | POA: Diagnosis not present

## 2024-01-17 DIAGNOSIS — M9901 Segmental and somatic dysfunction of cervical region: Secondary | ICD-10-CM | POA: Diagnosis not present

## 2024-01-19 DIAGNOSIS — N6342 Unspecified lump in left breast, subareolar: Secondary | ICD-10-CM | POA: Diagnosis not present

## 2024-01-19 DIAGNOSIS — M9901 Segmental and somatic dysfunction of cervical region: Secondary | ICD-10-CM | POA: Diagnosis not present

## 2024-01-19 DIAGNOSIS — M9902 Segmental and somatic dysfunction of thoracic region: Secondary | ICD-10-CM | POA: Diagnosis not present

## 2024-01-19 DIAGNOSIS — M546 Pain in thoracic spine: Secondary | ICD-10-CM | POA: Diagnosis not present

## 2024-01-19 DIAGNOSIS — M5412 Radiculopathy, cervical region: Secondary | ICD-10-CM | POA: Diagnosis not present

## 2024-01-31 ENCOUNTER — Telehealth: Admitting: *Deleted

## 2024-01-31 DIAGNOSIS — M9901 Segmental and somatic dysfunction of cervical region: Secondary | ICD-10-CM | POA: Diagnosis not present

## 2024-01-31 DIAGNOSIS — M9902 Segmental and somatic dysfunction of thoracic region: Secondary | ICD-10-CM | POA: Diagnosis not present

## 2024-01-31 DIAGNOSIS — M546 Pain in thoracic spine: Secondary | ICD-10-CM | POA: Diagnosis not present

## 2024-01-31 DIAGNOSIS — M5412 Radiculopathy, cervical region: Secondary | ICD-10-CM | POA: Diagnosis not present

## 2024-02-01 ENCOUNTER — Other Ambulatory Visit: Admitting: *Deleted

## 2024-02-01 NOTE — Patient Instructions (Signed)
 Visit Information  Thank you for taking time to visit with me today. Please don't hesitate to contact me if I can be of assistance to you before our next scheduled appointment.  Your next care management appointment is {NEXTVISITTYPE:26617} on *** at ***  {VBCIFOLLOWUP:32484}  Please call the care guide team at [phone] if you need to cancel, schedule, or reschedule an appointment.   Please {MHBHCRISISCONTACTS:26616} if you are experiencing a Mental Health or Behavioral Health Crisis or need someone to talk to.  SIGNATURE***   7

## 2024-02-02 ENCOUNTER — Ambulatory Visit: Payer: Self-pay

## 2024-02-02 DIAGNOSIS — M546 Pain in thoracic spine: Secondary | ICD-10-CM | POA: Diagnosis not present

## 2024-02-02 DIAGNOSIS — M9902 Segmental and somatic dysfunction of thoracic region: Secondary | ICD-10-CM | POA: Diagnosis not present

## 2024-02-02 DIAGNOSIS — M5412 Radiculopathy, cervical region: Secondary | ICD-10-CM | POA: Diagnosis not present

## 2024-02-02 DIAGNOSIS — M9901 Segmental and somatic dysfunction of cervical region: Secondary | ICD-10-CM | POA: Diagnosis not present

## 2024-02-02 NOTE — Telephone Encounter (Signed)
 FYI Only or Action Required?: FYI only for provider. Also please note the patient is scheduled for a double mastectomy on 02/04/24, advised her to speak with PCP tomorrow if she needs to contact anesthesia or surgeon's office regarding symptoms.  Patient was last seen in primary care on 11/12/2023 by Blair, Diane W, FNP.  Called Nurse Triage reporting Facial Pain and nasal discharge.  Symptoms began last week she visited [place]  and had flare up of allergies (sneezing then), additional symptoms started yesterday.  Interventions attempted: OTC medications: Xylitol nasal spray, Zyrtec.  Symptoms are: post nasal drip, sneezing, pain above right eye and down right side of face into right side of jaw, swollen lymph nodes along right side of jaw, gradually worsening.  Triage Disposition: See Physician Within 24 Hours  Patient/caregiver understands and will follow disposition?: Yes           Copied from CRM #[id number]. Topic: Clinical - Red Word Triage >> Feb 02, 2024  3:40 PM Essie A wrote: Red Word that prompted transfer to Nurse Triage: Patient has a surgery scheduled for 02/04/24.  Not feeling well with sinus pain, post nasal drip, sneezing and swollen sore gland on the right side. Reason for Disposition  [1] Tender node in the neck AND [2] also has a sore throat AND [3] minimal/no runny nose or cough  Answer Assessment - Initial Assessment Questions Patient states she is scheduled for a bilateral mastectomy on 02/04/24.  1. LOCATION: Where does it hurt?      Above right eye, down the right side of face and into jaw.  2. ONSET: When did the sinus pain start?  (e.g., hours, days)      Last night/this morning.  3. SEVERITY: How bad is the pain?   (Scale 0-10; or none, mild, moderate or severe)     4/10.  4. RECURRENT SYMPTOM: Have you ever had sinus problems before? If Yes, ask: When was the last time? and What happened that time?      She states she has issues with  allergies. She states last week she was in [place]  and felt like her allergies flared up with lots of sneezing.  5. NASAL CONGESTION: Is the nose blocked? If Yes, ask: Can you open it or must you breathe through your mouth?     No.  6. NASAL DISCHARGE: Do you have discharge from your nose? If so ask, What color?     Yes, not much is coming out and it is clear.  7. FEVER: Do you have a fever? If Yes, ask: What is it, how was it measured, and when did it start?      No.  8. OTHER SYMPTOMS: Do you have any other symptoms? (e.g., sore throat, cough, earache, difficulty breathing)     Post nasal drip, sneezing, swollen glands near jaw (right side), Denies chest pain, sore throat, ear aches, difficulty breathing, difficulty swallowing.  9. PREGNANCY: Is there any chance you are pregnant? When was your last menstrual period?     Hysterectomy.  Protocols used: Sinus Pain or Congestion-A-AH, Lymph Nodes - Swollen-A-AH

## 2024-02-02 NOTE — Patient Outreach (Signed)
 Complex Care Management   Visit Note  02/02/2024  Name:  Suzanne Wilcox MRN: [id number] DOB: 05/12/1976  Situation: Referral received for Complex Care Management related to Financial strain and need for in home aid support due to Chronic neck pain Cervical Foraminal Stenosis I obtained verbal consent from Patient. Visit completed with patient on the phone on 02/01/24.  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   Postoperative retention of urine 03/28/2016   Postpartum care following cesarean delivery (9/29) 03/27/2016   Sleep apnea    Termination of pregnancy (fetus) 03/22/2004   X 1   Vaginal Pap smear, abnormal     Assessment: Patient Reported Symptoms:  Cognitive Cognitive Status: Alert and oriented to person, place, and time, Normal speech and language skills Cognitive/Intellectual Conditions Management [RPT]: Autism Spectrum Disorder   Health Maintenance Behaviors: Annual physical exam, Stress management Health Facilitated by: Rest, Stress management  Neurological Neurological Review of Symptoms: No symptoms reported    HEENT HEENT Symptoms Reported: No symptoms reported      Cardiovascular Cardiovascular Symptoms Reported: No symptoms reported    Respiratory Respiratory Symptoms Reported: No symptoms reported    Endocrine Endocrine Symptoms Reported: No symptoms reported    Gastrointestinal Gastrointestinal Symptoms  Reported: Constipation Other Gastrointestinal Symptoms: constipation-continue to use miralax Gastrointestinal Management Strategies: Medication therapy, Coping strategies Gastrointestinal Self-Management Outcome: 4 (good)    Genitourinary Genitourinary Symptoms Reported: No symptoms reported    Integumentary      Musculoskeletal Musculoskelatal Symptoms Reviewed: Difficulty walking, Limited mobility Other Musculoskeletal Symptoms: neck and back pain getting better, not taking as much pain medication as before Musculoskeletal Management Strategies: Medication therapy, Routine screening, Coping strategies Musculoskeletal Comment: currently followed by a chiropractor      Psychosocial Psychosocial Symptoms Reported: Anxiety - if selected complete GAD Behavioral Management Strategies: Medication therapy, Coping strategies, Support group Behavioral Health Self-Management Outcome: 4 (good) Behavioral Health Comment: continues to manage well-continued follow up with Psychiatrist as scheduled-next appt 11/25-discussed pending bilateral mastectomy-processed feelings related to upcoming surgery Major Change/Loss/Stressor/Fears (CP): Medical condition, self Behaviors When Feeling Stressed/Fearful: Reports continue plan to put head down and push foward continues to remember her why will rely on support from sinificant other, sister and [employer] worker. currently approved for 20 hours per month.through committed to caring -contact tammy watkins [phone] Techniques to Cope with Loss/Stress/Change: Medication Quality of Family Relationships: supportive Do you feel physically threatened by others?: No      01/10/2024    3:06 PM  Depression screen PHQ 2/9  Decreased Interest 0  Down, Depressed, Hopeless 1  PHQ - 2 Score 1    There were no vitals filed for this visit.  Medications Reviewed Today   Medications were not reviewed in this  encounter     Recommendation:   PCP Follow-up-as  scheduled Specialty provider follow-up Vascular 03/03/24  Follow Up Plan:   Telephone follow up appointment date/time:  02/22/24  Lenn Mean, LCSW [facility] Licensed Clinical Social Worker  Direct Dial: [phone]

## 2024-02-03 ENCOUNTER — Encounter: Payer: Self-pay | Admitting: Family Medicine

## 2024-02-03 ENCOUNTER — Ambulatory Visit (INDEPENDENT_AMBULATORY_CARE_PROVIDER_SITE_OTHER): Admitting: Family Medicine

## 2024-02-03 VITALS — BP 132/84 | HR 86 | Temp 97.8°F | Ht 67.0 in | Wt 239.2 lb

## 2024-02-03 DIAGNOSIS — J069 Acute upper respiratory infection, unspecified: Secondary | ICD-10-CM

## 2024-02-03 NOTE — Patient Instructions (Addendum)
 Thank you for coming to the office today.  No sign of fevers or other concern. No sign of obvious bacterial or other significant infection today Reassurance overall Okay to proceed w/ surgery tomorrow as planned For post nasal drainage, okay to use Atrovent  today / tonight. Pause on day of surgery. Lymph node is localized and mild reactive to the respiratory illness, can be mild viral or other non specific vs allergic cause.  Please schedule a Follow-up Appointment to: Return if symptoms worsen or fail to improve.  If you have any other questions or concerns, please feel free to call the office or send a message through MyChart. You may also schedule an earlier appointment if necessary.  Additionally, you may be receiving a survey about your experience at our office within a few days to 1 week by e-mail or mail. We value your feedback.  Marsa Officer, DO [facility], [place]

## 2024-02-03 NOTE — Progress Notes (Signed)
 Subjective:    Patient ID: Suzanne Wilcox, female    DOB: 08-24-1975, 48 y.o.   MRN: [id number]  Suzanne Wilcox is a 48 y.o. female presenting on 02/03/2024 for Sore Throat (Sore throat, sinus pain couple of days )  Patient presents for a same day appointment.   HPI   Discussed the use of AI scribe software for clinical note transcription with the patient, who gave verbal consent to proceed.  History of Present Illness   Suzanne Wilcox is a 48 year old female who presents with sinus symptoms and concerns about swollen glands prior to her scheduled bilateral mastectomy.  Upper respiratory symptoms - Sinus symptoms including sneezing, sinus pain, pressure, and swelling - Postnasal drip present the previous day - Today improved. - No significant coughing - No sore throat - No fever - Mild itching in one ear - She has existing nasal spray. Atrovent  nasal spray provides relief of sinus pressure Admits 1 lymph node swollen R neck anterior  She has surgery tomorrow bilateral mastectomy     01/10/2024    3:06 PM 12/08/2023   10:21 AM 11/23/2023   10:22 AM  Depression screen PHQ 2/9  Decreased Interest 0 1 1  Down, Depressed, Hopeless 1 1 1   PHQ - 2 Score 1 2 2   Altered sleeping  0 0  Tired, decreased energy  1 1  Change in appetite  0 0  Feeling bad or failure about yourself   1 3  Trouble concentrating  3 3  Moving slowly or fidgety/restless  0 0  Suicidal thoughts  0 0  PHQ-9 Score  7 9  Difficult doing work/chores  Very difficult        01/10/2024    3:07 PM 12/08/2023   10:24 AM 11/23/2023   10:33 AM 06/04/2023   10:58 AM  GAD 7 : Generalized Anxiety Score  Nervous, Anxious, on Edge 2 2 3 3   Control/stop worrying 2 2 2 2   Worry too much - different things 2 2 3 2   Trouble relaxing 1 1 2 2   Restless 1 2 3 1   Easily annoyed or irritable 1 2 3 2   Afraid - awful might happen 1 2 1 2   Total GAD 7 Score 10 13 17 14   Anxiety Difficulty Very difficult Very difficult Very  difficult     Social History   Tobacco Use   Smoking status: Former    Current packs/day: 0.00    Average packs/day: 1 pack/day for 18.0 years (18.0 ttl pk-yrs)    Types: Cigarettes    Start date: 04/30/1991    Quit date: 04/29/2009    Years since quitting: 14.7   Smokeless tobacco: Never  Vaping Use   Vaping status: Never Used  Substance Use Topics   Alcohol use: Yes    Comment: OCCASIONALLY   Drug use: No    Review of Systems Per HPI unless specifically indicated above     Objective:    BP 132/84 (BP Location: Left Arm, Patient Position: Sitting, Cuff Size: Large)   Pulse 86   Temp 97.8 F (36.6 C) (Oral)   Ht 5' 7 (1.702 m)   Wt 239 lb 4 oz (108.5 kg)   LMP 07/12/2023   SpO2 98%   BMI 37.47 kg/m   Wt Readings from Last 3 Encounters:  02/03/24 239 lb 4 oz (108.5 kg)  12/06/23 238 lb 1.6 oz (108 kg)  09/22/23 245 lb 6.4 oz (111.3 kg)  Physical Exam Vitals and nursing note reviewed.  Constitutional:      General: She is not in acute distress.    Appearance: She is well-developed. She is not diaphoretic.     Comments: Well-appearing, comfortable, cooperative  HENT:     Head: Normocephalic and atraumatic.     Right Ear: Tympanic membrane, ear canal and external ear normal. There is no impacted cerumen.     Left Ear: Tympanic membrane, ear canal and external ear normal. There is no impacted cerumen.     Nose: Congestion present. No rhinorrhea.     Mouth/Throat:     Mouth: Mucous membranes are moist.     Pharynx: Oropharynx is clear. No oropharyngeal exudate or posterior oropharyngeal erythema.  Eyes:     General:        Right eye: No discharge.        Left eye: No discharge.     Conjunctiva/sclera: Conjunctivae normal.  Neck:     Thyroid : No thyromegaly.  Cardiovascular:     Rate and Rhythm: Normal rate and regular rhythm.     Heart sounds: Normal heart sounds. No murmur heard. Pulmonary:     Effort: Pulmonary effort is normal. No respiratory  distress.     Breath sounds: Normal breath sounds. No wheezing or rales.  Musculoskeletal:        General: Normal range of motion.     Cervical back: Normal range of motion and neck supple.  Lymphadenopathy:     Cervical: Cervical adenopathy (isolated localized mild R lymphadenopathy single node, non tender.) present.  Skin:    General: Skin is warm and dry.     Findings: No erythema or rash.  Neurological:     Mental Status: She is alert and oriented to person, place, and time.  Psychiatric:        Behavior: Behavior normal.     Comments: Well groomed, good eye contact, normal speech and thoughts     Results for orders placed or performed in visit on 09/03/23  Hemoglobin and hematocrit, blood   Collection Time: 09/03/23 11:51 AM  Result Value Ref Range   Hemoglobin 9.9 (L) 11.1 - 15.9 g/dL   Hematocrit 68.9 (L) 65.9 - 46.6 %      Assessment & Plan:   Problem List Items Addressed This Visit   None Visit Diagnoses       Upper respiratory tract infection, unspecified type    -  Primary        Acute upper respiratory symptoms with right cervical lymphadenopathy Mild sinus symptoms with reactive right isolated cervical lymphadenopathy likely due to viral or allergic trigger. No bacterial infection signs. Symptoms not expected to interfere with planned surgery. - Use Atrovent  nasal spray as needed for congestion. - Clean Atrovent  spray tip with alcohol wipes before use. - Increase fluid intake to reduce lymph node swelling. - Monitor for fever or worsening symptoms and contact if changes occur. - No antibiotics indicated - Proceed with surgery as planned unless fever develops.        No orders of the defined types were placed in this encounter.   No orders of the defined types were placed in this encounter.   Follow up plan: Return if symptoms worsen or fail to improve.   Marsa Officer, DO [facility] 02/03/2024,  8:11 AM

## 2024-02-04 DIAGNOSIS — D241 Benign neoplasm of right breast: Secondary | ICD-10-CM | POA: Diagnosis not present

## 2024-02-04 DIAGNOSIS — C50512 Malignant neoplasm of lower-outer quadrant of left female breast: Secondary | ICD-10-CM | POA: Diagnosis not present

## 2024-02-04 DIAGNOSIS — C50912 Malignant neoplasm of unspecified site of left female breast: Secondary | ICD-10-CM | POA: Diagnosis not present

## 2024-02-04 DIAGNOSIS — N6041 Mammary duct ectasia of right breast: Secondary | ICD-10-CM | POA: Diagnosis not present

## 2024-02-04 DIAGNOSIS — N6081 Other benign mammary dysplasias of right breast: Secondary | ICD-10-CM | POA: Diagnosis not present

## 2024-02-04 DIAGNOSIS — K219 Gastro-esophageal reflux disease without esophagitis: Secondary | ICD-10-CM | POA: Diagnosis not present

## 2024-02-04 DIAGNOSIS — I10 Essential (primary) hypertension: Secondary | ICD-10-CM | POA: Diagnosis not present

## 2024-02-04 DIAGNOSIS — Z7982 Long term (current) use of aspirin: Secondary | ICD-10-CM | POA: Diagnosis not present

## 2024-02-04 DIAGNOSIS — Z9013 Acquired absence of bilateral breasts and nipples: Secondary | ICD-10-CM | POA: Diagnosis not present

## 2024-02-04 DIAGNOSIS — N6011 Diffuse cystic mastopathy of right breast: Secondary | ICD-10-CM | POA: Diagnosis not present

## 2024-02-04 DIAGNOSIS — Z4001 Encounter for prophylactic removal of breast: Secondary | ICD-10-CM | POA: Diagnosis not present

## 2024-02-04 DIAGNOSIS — Z45812 Encounter for adjustment or removal of left breast implant: Secondary | ICD-10-CM | POA: Diagnosis not present

## 2024-02-04 DIAGNOSIS — Z45811 Encounter for adjustment or removal of right breast implant: Secondary | ICD-10-CM | POA: Diagnosis not present

## 2024-02-05 DIAGNOSIS — Z4001 Encounter for prophylactic removal of breast: Secondary | ICD-10-CM | POA: Diagnosis not present

## 2024-02-05 DIAGNOSIS — Z7982 Long term (current) use of aspirin: Secondary | ICD-10-CM | POA: Diagnosis not present

## 2024-02-05 DIAGNOSIS — I10 Essential (primary) hypertension: Secondary | ICD-10-CM | POA: Diagnosis not present

## 2024-02-05 DIAGNOSIS — N6041 Mammary duct ectasia of right breast: Secondary | ICD-10-CM | POA: Diagnosis not present

## 2024-02-05 DIAGNOSIS — K219 Gastro-esophageal reflux disease without esophagitis: Secondary | ICD-10-CM | POA: Diagnosis not present

## 2024-02-05 DIAGNOSIS — N6011 Diffuse cystic mastopathy of right breast: Secondary | ICD-10-CM | POA: Diagnosis not present

## 2024-02-05 DIAGNOSIS — N6081 Other benign mammary dysplasias of right breast: Secondary | ICD-10-CM | POA: Diagnosis not present

## 2024-02-07 ENCOUNTER — Other Ambulatory Visit: Payer: Self-pay

## 2024-02-09 NOTE — Patient Outreach (Signed)
 Complex Care Management   Visit Note    Name:  Suzanne Wilcox MRN: [id number] DOB: [birth year]  Situation: Referral received for Complex Care Management related to Anxiety, Hypertension and recent Bilateral Mastectomy.  I obtained verbal consent from Patient.  Visit completed with Ms. Zuba via telephone.  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   Postoperative retention of urine 03/28/2016   Postpartum care following cesarean delivery (9/29) 03/27/2016   Sleep apnea    Termination of pregnancy (fetus) 03/22/2004   X 1   Vaginal Pap smear, abnormal     Assessment: Patient Reported Symptoms: Cognitive Cognitive Status: Alert and oriented to person, place, and time, Normal speech and language skills Cognitive/Intellectual Conditions Management [RPT]: Autism Spectrum Disorder Behavior Disorders: Reports episodes of difficulty concentrating due to ADHD Health Maintenance Behaviors: Annual physical exam, Stress management Healing Pattern: Average Health Facilitated by: Rest, Stress management  Neurological Neurological Review of Symptoms: No symptoms reported Neurological Management Strategies: Routine screening Neurological Self-Management Outcome: 4 (good)  HEENT HEENT Symptoms Reported: No symptoms reported HEENT Management Strategies: Routine screening HEENT Self-Management Outcome: 4 (good)  Cardiovascular Cardiovascular  Symptoms Reported: No symptoms reported Does patient have uncontrolled Hypertension?: No  Respiratory Respiratory Symptoms Reported: No symptoms reported Respiratory Management Strategies: Routine screening Respiratory Self-Management Outcome: 4 (good)  Endocrine Endocrine Symptoms Reported: No symptoms reported Is patient diabetic?: No Endocrine Self-Management Outcome: 4 (good)  Gastrointestinal Gastrointestinal Symptoms Reported: No symptoms reported Other Gastrointestinal Symptoms: Denies symptoms today. She experiences episodes of constipation which has been a chronic problem. Uses miralax for relief as needed  Genitourinary Genitourinary Symptoms Reported: No symptoms reported  Integumentary Integumentary Symptoms Reported: Other Other Integumentary Symptoms: Completed double mastectomy on February 04, 2024. Reports wearing compression device. Reports 4 jp drains to chest/2 to each side. She will follow up with the Plastic surgery team on February 11, 2024. Skin Management Strategies: Routine screening Skin Self-Management Outcome: 4 (good)  Musculoskeletal Musculoskelatal Symptoms Reviewed: Other Other Musculoskeletal Symptoms: Activity is currently limited due to post double mastectomy. Reports previous difficulty ambulating due to chronic joint and back pain. Musculoskeletal Management Strategies: Medical device, Medication therapy, Routine screening, Coping strategies Musculoskeletal Self-Management Outcome: 4 (good)  Psychosocial Psychosocial Symptoms Reported: Other Other Psychosocial Conditions: Denies symptoms at time of call. Experiences anxiety and has history of  ADHD. Reports pending feedback regarding plan for long term counseling services. Quality of Family Relationships: supportive, involved, helpful Do you feel physically threatened by others?: No      02/07/2024    3:01 PM  Depression screen PHQ 2/9  Decreased Interest 0  Down, Depressed, Hopeless 0  PHQ - 2 Score 0     There were no vitals filed for this visit.  Medications  Reviewed Today     Reviewed by Karoline Lima, RN (Registered Nurse) on 02/09/24 at 1335  Med List Status: <None>   Medication Order Taking? Sig Documenting Provider Last Dose Status Informant  acetaminophen  (TYLENOL ) 500 MG tablet [id number]  Take 1 tablet (500 mg total) by mouth every 6 (six) hours as needed. Connell Davies, MD  Active   acetaminophen -caffeine (EXCEDRIN TENSION HEADACHE) 500-65 MG TABS per tablet [id number]  Take 1 tablet by mouth daily. May take another 2 tablet dose throughout the day as needed for a headache [provider]  Active Self  ALPRAZolam (XANAX) 1 MG tablet [id number]  Take 1 mg by mouth 3 (three) times daily as needed for anxiety. [provider]  Active Self  amLODipine  (NORVASC ) 5 MG tablet [id number]  TAKE 1 TABLET(5 MG) BY MOUTH DAILY Karamalegos, Marsa PARAS, DO  Active   amphetamine-dextroamphetamine (ADDERALL) 30 MG tablet [id number]  Take 30 mg by mouth daily. May take a second 30 mg dose in the afternoon as needed for attention [provider]  Active Self  buPROPion  (WELLBUTRIN ) 100 MG tablet [id number]  Take 100-200 mg by mouth See admin instructions. Take 200 mg in the morning and 100 mg in the evening [provider]  Active Self  Calcium Citrate-Vitamin D (CALCIUM CITRATE CHEWY BITE PO) [id number]  Take 1 tablet by mouth 3 (three) times daily. [provider]  Active Self  CAPLYTA 42 MG capsule [id number]  Take 42 mg by mouth at bedtime. [provider]  Active Self  Coenzyme Q10 (COQ-10 PO) [id number]  Take 1 tablet by mouth daily. [provider]  Active Self  cyclobenzaprine  (FLEXERIL ) 10 MG tablet [id number]  Take 1 tablet (10 mg total) by mouth 3 (three) times daily as needed for muscle spasms. Karamalegos, Marsa PARAS, DO  Active   docusate sodium  (COLACE) 100 MG capsule [id number]  Take 1 capsule (100 mg total) by mouth 2 (two) times  daily as needed. Connell Davies, MD  Active   estradiol  (CLIMARA ) 0.1 mg/24hr patch [id number]  Place 1 patch (0.1 mg total) onto the skin once a week. Starla Harland BROCKS, MD  Active   furosemide  (LASIX ) 40 MG tablet [id number]  Take 40 mg by mouth as needed for edema ([place] as needed for swelling in legs/ankles/feet). [provider]  Active   Iron-Vitamin C (VITRON-C) 65-125 MG TABS [id number]  Take 1 tablet by mouth 2 (two) times daily. [provider]  Active Self  Multiple Vitamins-Minerals (BARIATRIC MULTIVITAMINS/IRON) CAPS [id number]  Take 1 capsule by mouth daily. [provider]  Active Self  nystatin (MYCOSTATIN/NYSTOP) powder [id number]  Apply 1 Application topically 2 (two) times daily as needed (rash). [provider]  Active Self  nystatin cream (MYCOSTATIN) [id number]  Apply 1 Application topically 2 (two) times daily as needed (rash). [provider]  Active Self  Omega-3 Fatty Acids (FISH OIL PO) [id number]  Take 1 capsule by mouth daily. [provider]  Active Self  ondansetron  (ZOFRAN ) 8 MG tablet [id number]  Take 8 mg by mouth every 8 (eight) hours as needed for nausea or vomiting. Pt has not started medication yet [provider]  Active Self           Med Note NORVEL KIRSCH   Wed Apr 28, 2023  9:18 AM)    oxyCODONE -acetaminophen  (PERCOCET/ROXICET) 5-325 MG tablet [id number]  Take 1 tablet by mouth every 4 (four) hours as needed for severe pain (pain score 7-10). Edman, Marsa PARAS,  DO  Active   pantoprazole  (PROTONIX ) 40 MG tablet [id number]  Take 1 tablet (40 mg total) by mouth 2 (two) times daily before a meal. Karamalegos, Marsa PARAS, DO  Active   Phenylephrine -Acetaminophen  (TYLENOL  SINUS CONGESTION/PAIN PO) [id number]  Take 2 tablets by mouth daily as needed (congestion). [provider]  Active Self            Recommendation:   Continue Current Plan of Care Complete outreach with Plastic  Surgery on  February 11, 2024 Complete outreach with Surgical Oncology on February 14, 2024  Follow Up Plan:   Telephone follow up appointment Nurse Case Manager on March 23, 2024   Jackson Acron [facility] RN Care Manager Direct Dial: [phone]  Fax: [phone] Website: [email or web address]

## 2024-02-09 NOTE — Patient Instructions (Signed)
 Thank you for allowing the Complex Care Management team to participate in your care. It was great speaking with you!  Reminders: Please attend your follow up with Plastic Surgery on February 11, 2024. Please attend your follow up with the Surgical Oncology team on February 14, 2024.  We will follow up on March 23, 2024 at 2:30 pm. Please do not hesitate to contact me if you require assistance prior to our next outreach.    Jackson Acron [facility] Population Health RN Care Manager Direct Dial: [phone]  Fax: [phone] Website: [email or web address]

## 2024-02-13 ENCOUNTER — Encounter (INDEPENDENT_AMBULATORY_CARE_PROVIDER_SITE_OTHER): Payer: Self-pay

## 2024-02-14 DIAGNOSIS — Z171 Estrogen receptor negative status [ER-]: Secondary | ICD-10-CM | POA: Diagnosis not present

## 2024-02-14 DIAGNOSIS — C50912 Malignant neoplasm of unspecified site of left female breast: Secondary | ICD-10-CM | POA: Diagnosis not present

## 2024-02-17 DIAGNOSIS — K219 Gastro-esophageal reflux disease without esophagitis: Secondary | ICD-10-CM | POA: Diagnosis not present

## 2024-02-17 DIAGNOSIS — Z79899 Other long term (current) drug therapy: Secondary | ICD-10-CM | POA: Diagnosis not present

## 2024-02-17 DIAGNOSIS — Z7982 Long term (current) use of aspirin: Secondary | ICD-10-CM | POA: Diagnosis not present

## 2024-02-17 DIAGNOSIS — C50912 Malignant neoplasm of unspecified site of left female breast: Secondary | ICD-10-CM | POA: Diagnosis not present

## 2024-02-17 DIAGNOSIS — I10 Essential (primary) hypertension: Secondary | ICD-10-CM | POA: Diagnosis not present

## 2024-02-17 DIAGNOSIS — Z853 Personal history of malignant neoplasm of breast: Secondary | ICD-10-CM | POA: Diagnosis not present

## 2024-02-17 DIAGNOSIS — Z888 Allergy status to other drugs, medicaments and biological substances status: Secondary | ICD-10-CM | POA: Diagnosis not present

## 2024-02-17 DIAGNOSIS — Z9012 Acquired absence of left breast and nipple: Secondary | ICD-10-CM | POA: Diagnosis not present

## 2024-02-17 DIAGNOSIS — E785 Hyperlipidemia, unspecified: Secondary | ICD-10-CM | POA: Diagnosis not present

## 2024-02-22 ENCOUNTER — Telehealth: Admitting: *Deleted

## 2024-02-23 ENCOUNTER — Encounter: Payer: Self-pay | Admitting: Family Medicine

## 2024-02-23 DIAGNOSIS — Z9013 Acquired absence of bilateral breasts and nipples: Secondary | ICD-10-CM

## 2024-02-23 DIAGNOSIS — M5020 Other cervical disc displacement, unspecified cervical region: Secondary | ICD-10-CM

## 2024-02-23 DIAGNOSIS — M47816 Spondylosis without myelopathy or radiculopathy, lumbar region: Secondary | ICD-10-CM

## 2024-02-24 ENCOUNTER — Other Ambulatory Visit: Payer: Self-pay | Admitting: *Deleted

## 2024-02-24 DIAGNOSIS — C50112 Malignant neoplasm of central portion of left female breast: Secondary | ICD-10-CM | POA: Diagnosis not present

## 2024-02-24 DIAGNOSIS — C50912 Malignant neoplasm of unspecified site of left female breast: Secondary | ICD-10-CM | POA: Diagnosis not present

## 2024-02-24 DIAGNOSIS — Z171 Estrogen receptor negative status [ER-]: Secondary | ICD-10-CM | POA: Diagnosis not present

## 2024-02-24 MED ORDER — OXYCODONE-ACETAMINOPHEN 7.5-325 MG PO TABS: 1.0000 | ORAL_TABLET | ORAL | 0 refills | Status: DC | PRN

## 2024-02-24 NOTE — Addendum Note (Signed)
 Addended by: EDMAN MARSA PARAS on: 02/24/2024 10:59 AM   Modules accepted: Orders

## 2024-02-25 NOTE — Patient Instructions (Signed)
 Visit Information  Thank you for taking time to visit with me today. Please don't hesitate to contact me if I can be of assistance to you before our next scheduled appointment.  Your next care management appointment is no further scheduled appointments.   Please call this social worker with any additional community resource needs   Please call the care guide team at [phone] if you need to cancel, schedule, or reschedule an appointment.   Please call the Suicide and Crisis Lifeline: 988 call the [place]  National Suicide Prevention Lifeline: 424-648-4286 or TTY: (262)538-0170 TTY 319-328-4792) to talk to a trained counselor call 1-800-273-TALK (toll free, 24 hour hotline) call 911 if you are experiencing a Mental Health or Behavioral Health Crisis or need someone to talk to.  Rowena Moilanen, LCSW [place]  [facility], [facility] Licensed Clinical Social Worker  Direct Dial: [phone]

## 2024-02-25 NOTE — Patient Outreach (Signed)
 Complex Care Management   Visit Note  02/25/2024  Name:  Suzanne Wilcox MRN: [id number] DOB: Jan 01, 1976  Situation: Referral received for Complex Care Management related to Financial strain and need for in home aid support due to Chronic neck pain Cervical Foraminal Stenosis I obtained verbal consent from Patient. Visit completed with patient on the phone on 02/24/24.    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   Postoperative retention of urine 03/28/2016   Postpartum care following cesarean delivery (9/29) 03/27/2016   Sleep apnea    Termination of pregnancy (fetus) 03/22/2004   X 1   Vaginal Pap smear, abnormal     Assessment: Patient Reported Symptoms:  Cognitive Cognitive Status: Alert and oriented to person, place, and time, Normal speech and language skills, Insightful and able to interpret abstract concepts Cognitive/Intellectual Conditions Management [RPT]: Autism Spectrum Disorder   Health Maintenance Behaviors: Annual physical exam, Stress management Health Facilitated by: Rest, Stress management  Neurological Neurological Review of Symptoms: No symptoms reported    HEENT HEENT Symptoms Reported: No symptoms reported      Cardiovascular Cardiovascular Symptoms Reported: No symptoms reported    Respiratory Respiratory Symptoms Reported: No symptoms reported    Endocrine Endocrine Symptoms Reported: No symptoms  reported    Gastrointestinal Gastrointestinal Symptoms Reported: No symptoms reported      Genitourinary Genitourinary Symptoms Reported: No symptoms reported    Integumentary Integumentary Symptoms Reported: Other Other Integumentary Symptoms: completed double mastectomy on 02/04/24-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 Loni up schedled for 11/25 Behavioral Management Strategies: Medication therapy, Coping strategies, Support group Behavioral Health Self-Management Outcome: 4 (good) Behavioral Health Comment: continues to  follow up with Psychiatrist on 11/25-no longer receivng PCS services however now receivng peer support services  through [facility], [facility]  and was able to receive additional support servicces ie assisted with taxes on the house, gas card Major Change/Loss/Stressor/Fears (CP): Medical condition, self Behaviors When Feeling Stressed/Fearful: Reports continue plan to put head down and push foward continues to rely on family support as well as light therapy Techniques to Cope with Loss/Stress/Change: Medication Quality of Family Relationships: supportive, involved, helpful Do you feel physically threatened by others?: No    02/25/2024    PHQ2-9 Depression Screening   Little interest or pleasure in doing  things    Feeling down, depressed, or hopeless    PHQ-2 -  Total Score    Trouble falling or staying asleep, or sleeping too much    Feeling tired or having little energy    Poor appetite or overeating     Feeling bad about yourself - or that you are a failure or have let yourself or your family down    Trouble concentrating on things, such as reading the newspaper or watching television    Moving or speaking so slowly that other people could have noticed.  Or the opposite - being so fidgety or restless that you have been moving around a lot more than usual    Thoughts that you would be better off dead, or hurting yourself in some way    PHQ2-9 Total Score    If you checked off any problems, how difficult have these problems made it for you to do your work, take care of things at home, or get along with other people    Depression Interventions/Treatment      There were no vitals filed for this visit.  Medications Reviewed Today   Medications were not reviewed in this encounter     Recommendation:   PCP Follow-up Specialty provider follow-up a scheduled Continue follow up with the Peer Support through [facility], Psychiatrist-Dr. Vincente 05/23/24 and [facility]  Follow Up Plan:   Patient has met all care management goals. Care Management case will be closed. Patient has been provided contact information should new needs arise.     Silvano Garofano, LCSW [place]  [facility], [facility] Licensed Clinical Social Worker  Direct Dial: [phone]

## 2024-03-03 ENCOUNTER — Ambulatory Visit (INDEPENDENT_AMBULATORY_CARE_PROVIDER_SITE_OTHER): Payer: 59 | Admitting: Vascular Surgery

## 2024-03-03 ENCOUNTER — Encounter (INDEPENDENT_AMBULATORY_CARE_PROVIDER_SITE_OTHER): Payer: Self-pay | Admitting: Vascular Surgery

## 2024-03-03 VITALS — BP 162/99 | HR 73 | Ht 67.0 in | Wt 244.0 lb

## 2024-03-03 DIAGNOSIS — I890 Lymphedema, not elsewhere classified: Secondary | ICD-10-CM | POA: Diagnosis not present

## 2024-03-03 DIAGNOSIS — I83811 Varicose veins of right lower extremities with pain: Secondary | ICD-10-CM | POA: Diagnosis not present

## 2024-03-03 DIAGNOSIS — I10 Essential (primary) hypertension: Secondary | ICD-10-CM | POA: Diagnosis not present

## 2024-03-03 NOTE — Assessment & Plan Note (Signed)
 With her recent major surgery and inactivity, she is going to get back to using her lymphedema pump and trying to get back to her compression socks as soon as she can.  We will reassess her in a few months to see how she is doing to determine if we need to recheck her reflux study or do anything more aggressive and invasive.

## 2024-03-03 NOTE — Assessment & Plan Note (Signed)
 blood pressure control important in reducing the progression of atherosclerotic disease. On appropriate oral medications.

## 2024-03-03 NOTE — Progress Notes (Signed)
 MRN : [id number]  Suzanne Wilcox is a 48 y.o. ([birth year]) female who presents with chief complaint of  Chief Complaint  Patient presents with   Follow-up    Having swelling in arms and legs , she has  surgery done notice this after having surgery   .  History of Present Illness: Patient returns today in follow up of leg swelling with lymphedema and mild venous disease.  Her legs have been doing very well until recently.  About a month ago, she underwent bilateral mastectomy for breast cancer.  She also had had a left axillary node dissection.  She has been having pain and swelling in the left arm.  In the last few days, she has noticed her legs having more puffiness and swelling.  Her most recent mastectomy drain just came out earlier this week.  She is still on restrictions and her activity level is reduced at this point.  No chest pain or shortness of breath.  Her arm feels like it has a deep bruise.  She has also noticed significant left arm swelling.  She has not been using her lymphedema pump.  She cannot currently get her compression socks on due to her activity restrictions.  Current Outpatient Medications  Medication Sig Dispense Refill   acetaminophen  (TYLENOL ) 500 MG tablet Take 1 tablet (500 mg total) by mouth every 6 (six) hours as needed. 30 tablet 0   acetaminophen -caffeine (EXCEDRIN TENSION HEADACHE) 500-65 MG TABS per tablet Take 1 tablet by mouth daily. May take another 2 tablet dose throughout the day as needed for a headache     ALPRAZolam (XANAX) 1 MG tablet Take 1 mg by mouth 3 (three) times daily as needed for anxiety.     amLODipine  (NORVASC ) 5 MG tablet TAKE 1 TABLET(5 MG) BY MOUTH DAILY 90 tablet 1   amphetamine-dextroamphetamine (ADDERALL) 30 MG tablet Take 30 mg by mouth daily. May take a second 30 mg dose in the afternoon as needed for attention     buPROPion  (WELLBUTRIN ) 100 MG tablet Take 100-200 mg by mouth See admin instructions. Take 200 mg in the morning and  100 mg in the evening     Calcium Citrate-Vitamin D (CALCIUM CITRATE CHEWY BITE PO) Take 1 tablet by mouth 3 (three) times daily.     CAPLYTA 42 MG capsule Take 42 mg by mouth at bedtime.     Coenzyme Q10 (COQ-10 PO) Take 1 tablet by mouth daily.     cyclobenzaprine  (FLEXERIL ) 10 MG tablet Take 1 tablet (10 mg total) by mouth 3 (three) times daily as needed for muscle spasms. 90 tablet 2   docusate sodium  (COLACE) 100 MG capsule Take 1 capsule (100 mg total) by mouth 2 (two) times daily as needed. 30 capsule 2   estradiol  (CLIMARA ) 0.1 mg/24hr patch Place 1 patch (0.1 mg total) onto the skin once a week. 4 patch 12   furosemide  (LASIX ) 40 MG tablet Take 40 mg by mouth as needed for edema (Takles as needed for swelling in legs/ankles/feet).     gabapentin  (NEURONTIN ) 300 MG capsule Take by mouth.     hydrOXYzine (VISTARIL) 25 MG capsule Take 25 mg by mouth 3 (three) times daily.     Iron-Vitamin C (VITRON-C) 65-125 MG TABS Take 1 tablet by mouth 2 (two) times daily.     Multiple Vitamins-Minerals (BARIATRIC MULTIVITAMINS/IRON) CAPS Take 1 capsule by mouth daily.     nystatin (MYCOSTATIN/NYSTOP) powder Apply 1 Application topically 2 (two) times  daily as needed (rash).     Omega-3 Fatty Acids (FISH OIL PO) Take 1 capsule by mouth daily.     ondansetron  (ZOFRAN ) 8 MG tablet Take 8 mg by mouth every 8 (eight) hours as needed for nausea or vomiting. Pt has not started medication yet     oxyCODONE -acetaminophen  (PERCOCET) 7.5-325 MG tablet Take 1 tablet by mouth every 4 (four) hours as needed for severe pain (pain score 7-10). 60 tablet 0   pantoprazole  (PROTONIX ) 40 MG tablet Take 1 tablet (40 mg total) by mouth 2 (two) times daily before a meal. 60 tablet 2   Phenylephrine -Acetaminophen  (TYLENOL  SINUS CONGESTION/PAIN PO) Take 2 tablets by mouth daily as needed (congestion). (Patient not taking: Reported on 03/03/2024)     No current facility-administered medications for this visit.    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   Postoperative retention of urine 03/28/2016   Postpartum care following cesarean delivery (9/29) 03/27/2016   Sleep apnea    Termination of pregnancy (fetus) 03/22/2004   X 1   Vaginal Pap smear, abnormal     Past Surgical History:  Procedure Laterality Date   CESAREAN SECTION  06/29/2009   X 1 [facility] [place]   CESAREAN SECTION N/A 05/22/2014   Procedure: Repeat CESAREAN SECTION;  Surgeon: Charlie JINNY Flowers, MD;  Location: [facility];  Service: Obstetrics;  Laterality: N/A;  EDD: 05/31/14   CESAREAN SECTION WITH BILATERAL TUBAL LIGATION Bilateral 03/27/2016   Procedure: Repeat CESAREAN SECTION WITH BILATERAL TUBAL LIGATION;  Surgeon: Charlie Flowers, MD;  Location: [facility];  Service: Obstetrics;  Laterality: Bilateral;  EDD: 04/01/16   CHOLECYSTECTOMY N/A 04/29/2017   Procedure: LAPAROSCOPIC CHOLECYSTECTOMY WITH INTRAOPERATIVE CHOLANGIOGRAM;  Surgeon: Ethyl Lenis, MD;  Location: [facility];  Service: General;  Laterality: N/A;  ERAS PATHWAY   COLONOSCOPY WITH PROPOFOL  N/A 09/04/2022   Procedure: COLONOSCOPY WITH PROPOFOL ;  Surgeon: Unk Corinn Skiff, MD;  Location: [facility] ENDOSCOPY;  Service: Gastroenterology;  Laterality: N/A;   DILATION AND CURETTAGE OF UTERUS   2005, 2010     x2 [facility]   ESOPHAGOGASTRODUODENOSCOPY N/A 01/11/2017   Procedure: ESOPHAGOGASTRODUODENOSCOPY (EGD);  Surgeon: Saintclair Jasper, MD;  Location: [facility];  Service: Gastroenterology;  Laterality: N/A;   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   IR GENERIC HISTORICAL  03/26/2016   IR FLUORO GUIDE CV LINE RIGHT 03/26/2016 [facility]   IR GENERIC HISTORICAL  03/26/2016   IR US  GUIDE VASC ACCESS RIGHT 03/26/2016 [facility]   LAPAROSCOPY N/A 08/26/2023   Procedure: LAPAROSCOPY DIAGNOSTIC;  Surgeon: Connell Davies, MD;  Location: [facility] ORS;  Service: Gynecology;  Laterality: N/A;   TOTAL LAPAROSCOPIC HYSTERECTOMY WITH BILATERAL SALPINGO OOPHORECTOMY N/A 08/11/2023   Procedure: TOTAL LAPAROSCOPIC HYSTERECTOMY WITH BILATERAL SALPINGO OOPHORECTOMY, WASHINGS;  Surgeon: Mancil Barter, MD;  Location: [facility] ORS;  Service: Gynecology;  Laterality: N/A;   WISDOM TOOTH EXTRACTION       Social History   Tobacco Use   Smoking status: Former    Current packs/day: 0.00    Average packs/day: 1 pack/day for 18.0 years (18.0 ttl pk-yrs)    Types: Cigarettes    Start date: 04/30/1991    Quit date: 04/29/2009    Years since quitting: 14.8   Smokeless tobacco: Never  Vaping Use   Vaping status: Never Used  Substance Use Topics   Alcohol use: Yes    Comment: OCCASIONALLY   Drug use: No      Family History  Problem Relation Age of Onset   Depression Mother    Skin cancer Mother    HIV/AIDS Father    Skin cancer Sister    Depression Paternal Grandmother    Breast cancer Neg Hx      Allergies  Allergen Reactions   Wound Dressing Adhesive Itching, Other (See Comments) and Swelling   Wound Dressings Itching, Other (See Comments) and Swelling   Adhesive [Tape] Itching and Swelling    If left on for prolonged periods of time    Lyrica  [Pregabalin ] Photosensitivity     REVIEW OF SYSTEMS (Negative unless checked)   Constitutional: [x] Weight loss  [] Fever  [] Chills Cardiac: [] Chest pain   [] Chest pressure   [] Palpitations   [] Shortness of breath when laying flat   [] Shortness of breath at rest   [] Shortness of breath with exertion. Vascular:  [] Pain  in legs with walking   [] Pain in legs at rest   [] Pain in legs when laying flat   [] Claudication   [] Pain in feet when walking  [] Pain in feet at rest  [] Pain in feet when laying flat   [] History of DVT   [] Phlebitis   [x] Swelling in legs   [x] Varicose veins   [] Non-healing ulcers Pulmonary:   [] Uses home oxygen   [] Productive cough   [] Hemoptysis   [] Wheeze  [] COPD   [] Asthma Neurologic:  [] Dizziness  [] Blackouts   [] Seizures   [] History of stroke   [] History of TIA  [] Aphasia   [] Temporary blindness   [] Dysphagia   [] Weakness or numbness in arms   [] Weakness or numbness in legs Musculoskeletal:  [] Arthritis   [] Joint swelling   [x] Joint pain   [] Low back pain Hematologic:  [] Easy bruising  [] Easy bleeding   [] Hypercoagulable state   [] Anemic   Gastrointestinal:  [] Blood in stool   [] Vomiting blood  [] Gastroesophageal reflux/heartburn   [] Abdominal pain Genitourinary:  [] Chronic kidney disease   [] Difficult urination  [] Frequent urination  [] Burning with urination   [] Hematuria Skin:  [] Rashes   [] Ulcers   [] Wounds Psychological:  [] History of anxiety   []  History of major depression.   Physical Examination  BP (!) 162/99   Pulse 73   Ht 5' 7 (1.702 m)   Wt 244 lb (110.7 kg)   LMP 07/12/2023   BMI 38.22 kg/m  Gen:  WD/WN, NAD Head: [place]/AT, No temporalis wasting. Ear/Nose/Throat: Hearing grossly intact, nares w/o erythema or drainage Eyes: Conjunctiva clear. Sclera non-icteric Neck: Supple.  Trachea midline Pulmonary:  Good air movement, no use of accessory muscles.  Cardiac: RRR, no JVD Vascular:  Vessel Right Left  Radial Palpable Palpable           Musculoskeletal: M/S 5/5 throughout.  No deformity or atrophy.  Trace to 1+ bilateral lower extremity edema.  Mild left upper  extremity edema. Neurologic: Sensation grossly intact in extremities.  Symmetrical.  Speech is fluent.  Psychiatric: Judgment intact, Mood & affect appropriate for pt's clinical situation. Dermatologic: No  rashes or ulcers noted.  No cellulitis or open wounds.      Labs No results found for this or any previous visit (from the past 2160 hours).  Radiology No results found.  Assessment/Plan  Essential hypertension blood pressure control important in reducing the progression of atherosclerotic disease. On appropriate oral medications.   Varicose veins of leg with pain, right With her recent major surgery and inactivity, she is going to get back to using her lymphedema pump and trying to get back to her compression socks as soon as she can.  We will reassess her in a few months to see how she is doing to determine if we need to recheck her reflux study or do anything more aggressive and invasive.  Lymphedema With her recent major surgery and inactivity, she is going to get back to using her lymphedema pump and trying to get back to her compression socks as soon as she can.  We will reassess her in a few months to see how she is doing to determine if we need to recheck her reflux study or do anything more aggressive and invasive.    Selinda Gu, MD  03/03/2024 10:20 AM    This note was created with Dragon medical transcription system.  Any errors from dictation are purely unintentional

## 2024-03-08 DIAGNOSIS — C50112 Malignant neoplasm of central portion of left female breast: Secondary | ICD-10-CM | POA: Diagnosis not present

## 2024-03-08 DIAGNOSIS — Z171 Estrogen receptor negative status [ER-]: Secondary | ICD-10-CM | POA: Diagnosis not present

## 2024-03-21 DIAGNOSIS — Z5111 Encounter for antineoplastic chemotherapy: Secondary | ICD-10-CM | POA: Diagnosis not present

## 2024-03-21 DIAGNOSIS — Z171 Estrogen receptor negative status [ER-]: Secondary | ICD-10-CM | POA: Diagnosis not present

## 2024-03-21 DIAGNOSIS — C50112 Malignant neoplasm of central portion of left female breast: Secondary | ICD-10-CM | POA: Diagnosis not present

## 2024-03-23 ENCOUNTER — Other Ambulatory Visit: Payer: Self-pay

## 2024-03-27 ENCOUNTER — Other Ambulatory Visit: Payer: Self-pay | Admitting: Family Medicine

## 2024-03-27 DIAGNOSIS — K219 Gastro-esophageal reflux disease without esophagitis: Secondary | ICD-10-CM

## 2024-03-27 MED ORDER — PANTOPRAZOLE SODIUM 40 MG PO TBEC: 40.0000 mg | DELAYED_RELEASE_TABLET | Freq: Two times a day (BID) | ORAL | 5 refills | Status: AC

## 2024-03-27 NOTE — Patient Outreach (Signed)
 Complex Care Management   Visit Note   Name:  Suzanne Wilcox MRN: [id number] DOB: 1975-09-11  Situation: Referral received for Complex Care Management related to Bipolar Disorder I obtained verbal consent from Patient.  Visit completed with Suzanne Wilcox via telephone.  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   Postoperative retention of urine 03/28/2016   Postpartum care following cesarean delivery (9/29) 03/27/2016   Sleep apnea    Termination of pregnancy (fetus) 03/22/2004   X 1   Vaginal Pap smear, abnormal     Assessment: Patient Reported Symptoms:  Cognitive Cognitive Status: Alert and oriented to person, place, and time, Normal speech and language skills Cognitive/Intellectual Conditions Management [RPT]: Other Behavior Disorders: Reports difficulty concentrating due to ADHD Health Maintenance Behaviors: Annual physical exam, Stress management Healing Pattern: Average Health Facilitated by: Prayer/meditation, Rest, Stress management  Neurological Neurological Review of Symptoms: No symptoms reported Neurological Management Strategies: Routine screening Neurological Self-Management Outcome: 4 (good)  HEENT HEENT Symptoms Reported: No symptoms reported HEENT Management Strategies: Routine screening HEENT Self-Management Outcome: 4 (good)  Cardiovascular Cardiovascular Symptoms Reported: No symptoms reported Does patient  have uncontrolled Hypertension?: No Cardiovascular Self-Management Outcome: 4 (good)  Respiratory Respiratory Symptoms Reported: No symptoms reported Respiratory Self-Management Outcome: 4 (good)  Endocrine Endocrine Symptoms Reported: No symptoms reported Is patient diabetic?: No Endocrine Self-Management Outcome: 4 (good)  Gastrointestinal Gastrointestinal Symptoms Reported: No symptoms reported  Genitourinary Genitourinary Symptoms Reported: No symptoms reported  Integumentary Integumentary Symptoms Reported: No symptoms reported  Musculoskeletal Musculoskelatal Symptoms Reviewed: No symptoms reported  Psychosocial Psychosocial Symptoms Reported: No symptoms reported Other Psychosocial Conditions: Reports doing well today. She has estblished care with [facility]. Scheduled for follow up on April 03, 2024. Quality of Family Relationships: supportive, involved, helpful Do you feel physically threatened by others?: No    03/27/2024    PHQ2-9 Depression Screening   Little interest or pleasure in doing things Not at all  Feeling down, depressed, or hopeless Not at all  PHQ-2 - Total Score 0    There were no vitals filed for this visit.  Medications Reviewed Today     Reviewed by Karoline Lima, RN (Registered Nurse) on 03/23/24 at 1455  Med List Status: <None>   Medication Order Taking? Sig Documenting Provider Last Dose Status Informant  acetaminophen  (TYLENOL ) 500 MG tablet [id number]  Take 1 tablet (500 mg total) by mouth every 6 (six) hours as needed. Connell Davies, MD  Active   acetaminophen -caffeine (EXCEDRIN TENSION HEADACHE) 500-65 MG TABS per tablet [id number]  Take 1 tablet by mouth daily. May take another 2 tablet dose throughout the day as needed for a headache [provider]  Active Self  ALPRAZolam (XANAX) 1 MG tablet [id number]  Take 1  mg by mouth 3 (three) times daily as needed for anxiety. [provider]  Active Self  amLODipine  (NORVASC ) 5 MG  tablet [id number]  TAKE 1 TABLET(5 MG) BY MOUTH DAILY Karamalegos, Marsa PARAS, DO  Active   amphetamine-dextroamphetamine (ADDERALL) 30 MG tablet [id number]  Take 30 mg by mouth daily. May take a second 30 mg dose in the afternoon as needed for attention [provider]  Active Self  buPROPion  (WELLBUTRIN ) 100 MG tablet [id number]  Take 100-200 mg by mouth See admin instructions. Take 200 mg in the morning and 100 mg in the evening [provider]  Active Self  Calcium Citrate-Vitamin D (CALCIUM CITRATE CHEWY BITE PO) [id number]  Take 1 tablet by mouth 3 (three) times daily. [provider]  Active Self  CAPLYTA 42 MG capsule [id number]  Take 42 mg by mouth at bedtime. [provider]  Active Self  Coenzyme Q10 (COQ-10 PO) [id number]  Take 1 tablet by mouth daily. [provider]  Active Self  cyclobenzaprine  (FLEXERIL ) 10 MG tablet [id number]  Take 1 tablet (10 mg total) by mouth 3 (three) times daily as needed for muscle spasms. Karamalegos, Marsa PARAS, DO  Active   docusate sodium  (COLACE) 100 MG capsule [id number]  Take 1 capsule (100 mg total) by mouth 2 (two) times daily as needed. Connell Davies, MD  Active   estradiol  (CLIMARA ) 0.1 mg/24hr patch [id number]  Place 1 patch (0.1 mg total) onto the skin once a week. Starla Harland BROCKS, MD  Active   furosemide  (LASIX ) 40 MG tablet [id number]  Take 40 mg by mouth as needed for edema (Takles as needed for swelling in legs/ankles/feet). [provider]  Active   gabapentin  (NEURONTIN ) 300 MG capsule [id number]  Take by mouth. [provider]  Active   hydrOXYzine (VISTARIL) 25 MG capsule [id number]  Take 25 mg by mouth 3 (three) times daily. [provider]  Active   Iron-Vitamin C (VITRON-C) 65-125 MG TABS [id number]  Take 1 tablet by mouth 2 (two) times daily. [provider]  Active Self  Multiple Vitamins-Minerals (BARIATRIC MULTIVITAMINS/IRON) CAPS [id number]  Take 1 capsule by mouth  daily. [provider]  Active Self  nystatin (MYCOSTATIN/NYSTOP) powder [id number]  Apply 1 Application topically 2 (two) times daily as needed (rash). [provider]  Active Self  Omega-3 Fatty Acids (FISH OIL PO) [id number]  Take 1 capsule by mouth daily. [provider]  Active Self  ondansetron  (ZOFRAN ) 8 MG tablet [id number]  Take 8 mg by mouth every 8 (eight) hours as needed for nausea or vomiting. Pt has not started medication yet [provider]  Active Self           Med Note NORVEL KIRSCH   Wed Apr 28, 2023  9:18 AM)    oxyCODONE -acetaminophen  (PERCOCET) 7.5-325 MG tablet [id number]  Take 1 tablet by mouth every 4 (four) hours as needed for severe pain (pain score 7-10). Edman Marsa PARAS, DO  Active   pantoprazole  (PROTONIX ) 40 MG tablet [id number]  Take 1 tablet (40 mg total) by mouth 2 (two) times daily before a meal. Edman, Marsa PARAS, DO  Active   Phenylephrine -Acetaminophen  (TYLENOL  SINUS CONGESTION/PAIN PO) [id number]  Take 2 tablets by mouth daily as needed (congestion).  Patient not taking: Reported on 03/03/2024   [provider]  Active Self            Recommendation:   Continue Current Plan of Care  Follow Up Plan:   Patient  has met all care management goals. Patient has been provided contact information should new needs arise.    Jackson Acron [facility] RN Care Manager Direct Dial: [phone]  Fax: [phone] Website: [email or web address]

## 2024-03-27 NOTE — Patient Instructions (Signed)
 Thank you for allowing the Complex Care Management team to participate in your care. It was great speaking with you!  Please do not hesitate to notify your primary care provider if you care needs change after you've completed treatments and additional outreach is required. The care management team will gladly assist.   Jackson Acron [facility] RN Care Manager Direct Dial: [phone]  Fax: [phone] Website: [email or web address]

## 2024-03-29 ENCOUNTER — Ambulatory Visit: Payer: Self-pay

## 2024-03-29 ENCOUNTER — Encounter: Payer: Self-pay | Admitting: Family Medicine

## 2024-03-29 ENCOUNTER — Ambulatory Visit (INDEPENDENT_AMBULATORY_CARE_PROVIDER_SITE_OTHER): Admitting: Family Medicine

## 2024-03-29 VITALS — BP 134/76 | HR 66 | Ht 67.0 in | Wt 240.0 lb

## 2024-03-29 DIAGNOSIS — M545 Low back pain, unspecified: Secondary | ICD-10-CM | POA: Diagnosis not present

## 2024-03-29 DIAGNOSIS — K2900 Acute gastritis without bleeding: Secondary | ICD-10-CM

## 2024-03-29 DIAGNOSIS — K219 Gastro-esophageal reflux disease without esophagitis: Secondary | ICD-10-CM | POA: Diagnosis not present

## 2024-03-29 DIAGNOSIS — G8929 Other chronic pain: Secondary | ICD-10-CM | POA: Diagnosis not present

## 2024-03-29 DIAGNOSIS — M542 Cervicalgia: Secondary | ICD-10-CM | POA: Diagnosis not present

## 2024-03-29 DIAGNOSIS — K625 Hemorrhage of anus and rectum: Secondary | ICD-10-CM

## 2024-03-29 DIAGNOSIS — M47816 Spondylosis without myelopathy or radiculopathy, lumbar region: Secondary | ICD-10-CM | POA: Diagnosis not present

## 2024-03-29 NOTE — Patient Instructions (Addendum)
 Thank you for coming to the office today.  STAT Labs today  Follow up results.  Hopefully Hemoglobin Platelets, stable  Chemistry also checked.  Keep on Pantopraztole  Monitor bleeding  If severe worsening persistent bleeding pain or anemia symptoms as discussed occur seek help at hospital ED   Please schedule a Follow-up Appointment to: Return if symptoms worsen or fail to improve.  If you have any other questions or concerns, please feel free to call the office or send a message through MyChart. You may also schedule an earlier appointment if necessary.  Additionally, you may be receiving a survey about your experience at our office within a few days to 1 week by e-mail or mail. We value your feedback.  Marsa Officer, DO [facility], [place]

## 2024-03-29 NOTE — Telephone Encounter (Signed)
 FYI Only or Action Required?: FYI only for provider.  Patient was last seen in primary care on 02/03/2024 by Edman Marsa PARAS, DO.  Called Nurse Triage reporting Rectal Bleeding.  Symptoms began several days ago.  Interventions attempted: Other: Stopped taking ibuprofen , taking zofran  for nausea.  Symptoms are: gradually improving.  Triage Disposition: See Physician Within 24 Hours  Patient/caregiver understands and will follow disposition?: Yes  Copied from CRM #[id number]. Topic: Clinical - Red Word Triage >> Mar 29, 2024 11:16 AM [name] wrote: [facility] that prompted transfer to Nurse Triage: Patient is having diarrhea with blood in stool. Patient reports she currently had first chemo treatment, and was advised by oncologist to see PCP provider as soon as possible for evaluation.  Reason for Disposition  MODERATE rectal bleeding (e.g., small blood clots, passing blood without stool, or toilet water turns red)    Undergoing chemo (initiated tx on 09/26), also recently has been taking miralax for constipation.  Answer Assessment - Initial Assessment Questions 1. APPEARANCE of BLOOD: What color is it? Is it passed separately, on the surface of the stool, or mixed in with the stool?      Bright red  single clot.  2. AMOUNT: How much blood was passed?      Moderate  3. FREQUENCY: How many times has blood been passed with the stools?      On and off since Sunday  4. ONSET: When was the blood first seen in the stools? (Days or weeks)      Sunday  5. DIARRHEA: Is there also some diarrhea? If Yes, ask: How many diarrhea stools in the past 24 hours?      Since Sunday. 4 episodes last 24 hours. None today.  6. CONSTIPATION: Do you have constipation? If Yes, ask: How bad is it?     Not currently. Had constipation per oncology note on 9/26, pt began taking miralax.   7. RECURRENT SYMPTOMS: Have you had blood in your stools before? If Yes, ask: When was the last  time? and What happened that time?      Yes, has small amount of blood in stool in the past when taking ibuprofen , resolves in 24 hours after stopping ibuprofen . Bone pain r/t recent chemo not responding to tylenol , pt reports taking ibuprofen  on Sunday to manage pain. Onset of blood on Sunday, stopped taking ibuprofen  soon after. 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  8. BLOOD THINNERS: Do you take any blood thinners? (e.g., aspirin, clopidogrel / Plavix, coumadin, heparin ). Notes: Other strong blood thinners include: Arixtra (fondaparinux), Eliquis (apixaban), Pradaxa (dabigatran), and Xarelto (rivaroxaban).     No  9. OTHER SYMPTOMS: Do you have any other symptoms?  (e.g., abdomen pain, vomiting, dizziness, fever)     None. Nausea r/t chemo controlled with zofran .  10. PREGNANCY: Is there any chance you are pregnant? When was your last menstrual period?       No.  Protocols used: Rectal Bleeding-A-AH

## 2024-03-29 NOTE — Progress Notes (Signed)
 Subjective:    Patient ID: Suzanne Wilcox, female    DOB: 1976-04-13, 48 y.o.   MRN: [id number]  Suzanne Wilcox is a 48 y.o. female presenting on 03/29/2024 for Rectal Bleeding (Sunday evening )  Patient presents for a same day appointment.   HPI  Discussed the use of AI scribe software for clinical note transcription with the patient, who gave verbal consent to proceed.  History of Present Illness   Suzanne Wilcox is a 48 year old female who presents with rectal bleeding and diarrhea.  Rectal bleeding - Onset of rectal bleeding began Sunday night after recent ibuprofen use for joint and bone pain post-chemotherapy - Bleeding worsened on Monday, with varying amounts of blood, sometimes presenting as small clots - By Tuesday night, blood was visible in the toilet and a small clot was present on tissue - No further rectal bleeding since the last episode after discontinuing ibuprofen Saturday evening - History of non-bleeding external hemorrhoids with occasional bright red bleeding with hard stools, but not clots  Diarrhea - Diarrhea began Sunday with burning stomach pain - Persisted throughout Monday, with episodes containing varying amounts of blood, including small clots - Last episode of diarrhea occurred either last night or early this morning, with no blood present - Diarrhea was darker on Monday but has since returned to a more normal color  Gastrointestinal symptoms - Burning stomach pain began on Sunday, coinciding with onset of diarrhea - No upper abdominal pain, nausea, or vomiting - No fever  Medication use - Recent use of ibuprofen (combination of Tylenol and Advil, as well as leftover 600 mg ibuprofen) approximately every other night, last dose on Saturday evening - Current medications include Protonix twice daily for gastric protection, oxycodone-acetaminophen 7.5-325 mg as needed for pain (60 pills lasting more than 30 days), and Tylenol up to 1000 mg three times a  day as advised by oncology team         03/29/2024    3:13 PM 03/23/2024    3:08 PM 02/07/2024    3:01 PM  Depression screen PHQ 2/9  Decreased Interest 2 0 0  Down, Depressed, Hopeless 0 0 0  PHQ - 2 Score 2 0 0  Altered sleeping 1    Tired, decreased energy 2    Change in appetite 2    Feeling bad or failure about yourself  2    Trouble concentrating 3    Moving slowly or fidgety/restless 0    Suicidal thoughts 0    PHQ-9 Score 12    Difficult doing work/chores Very difficult         10 /06/2023    3:14 PM 01/10/2024    3:07 PM 12/08/2023   10:24 AM 11/23/2023   10:33 AM  GAD 7 : Generalized Anxiety Score  Nervous, Anxious, on Edge 2 2 2 3   Control/stop worrying 1 2 2 2   Worry too much - different things 1 2 2 3   Trouble relaxing 3 1 1 2   Restless 1 1 2 3   Easily annoyed or irritable 2 1 2 3   Afraid - awful might happen 1 1 2 1   Total GAD 7 Score 11 10 13 17   Anxiety Difficulty Very difficult Very difficult Very difficult Very difficult    Social History   Tobacco Use   Smoking status: Former    Current packs/day: 0.00    Average packs/day: 1 pack/day for 18.0 years (18.0 ttl pk-yrs)    Types: Cigarettes  Start date: 04/30/1991    Quit date: 04/29/2009    Years since quitting: 14.9   Smokeless tobacco: Never  Vaping Use   Vaping status: Never Used  Substance Use Topics   Alcohol use: Yes    Comment: OCCASIONALLY   Drug use: No    Review of Systems Per HPI unless specifically indicated above     Objective:    BP 134/76 (BP Location: Right Arm, Patient Position: Sitting, Cuff Size: Large)   Pulse 66   Ht 5' 7 (1.702 m)   Wt 240 lb (108.9 kg)   LMP 07/12/2023   SpO2 94%   BMI 37.59 kg/m   Wt Readings from Last 3 Encounters:  03/29/24 240 lb (108.9 kg)  03/03/24 244 lb (110.7 kg)  02/03/24 239 lb 4 oz (108.5 kg)    Physical Exam Vitals and nursing note reviewed.  Constitutional:      General: She is not in acute distress.    Appearance: Normal  appearance. She is well-developed. She is not diaphoretic.     Comments: Well-appearing, comfortable, cooperative  HENT:     Head: Normocephalic and atraumatic.  Eyes:     General:        Right eye: No discharge.        Left eye: No discharge.     Conjunctiva/sclera: Conjunctivae normal.  Cardiovascular:     Rate and Rhythm: Normal rate.  Pulmonary:     Effort: Pulmonary effort is normal.  Abdominal:     General: Bowel sounds are normal. There is no distension.     Tenderness: There is no abdominal tenderness. There is no guarding or rebound.  Skin:    General: Skin is warm and dry.     Findings: No erythema or rash.  Neurological:     Mental Status: She is alert and oriented to person, place, and time.  Psychiatric:        Mood and Affect: Mood normal.        Behavior: Behavior normal.        Thought Content: Thought content normal.     Comments: Well groomed, good eye contact, normal speech and thoughts     Results for orders placed or performed in visit on 09/03/23  Hemoglobin and hematocrit, blood   Collection Time: 09/03/23 11:51 AM  Result Value Ref Range   Hemoglobin 9.9 (L) 11.1 - 15.9 g/dL   Hematocrit 68.9 (L) 65.9 - 46.6 %      Assessment & Plan:   Problem List Items Addressed This Visit     Chronic low back pain   Relevant Medications   dexamethasone  (DECADRON ) 4 MG tablet   Chronic neck pain   Relevant Medications   dexamethasone  (DECADRON ) 4 MG tablet   Spondylosis of lumbar joint   Other Visit Diagnoses       Rectal bleeding    -  Primary   Relevant Orders   CBC with Differential/Platelet   Basic metabolic panel with GFR     Gastroesophageal reflux disease without esophagitis         Acute gastritis, presence of bleeding unspecified, unspecified gastritis type       Relevant Orders   CBC with Differential/Platelet   Basic metabolic panel with GFR        Rectal bleeding and diarrhea Intermittent rectal bleeding and diarrhea post-ibuprofen   use, likely lower GI bleeding exacerbated by chemotherapy-induced thrombocytopenia. Upper GI bleeding less likely but cannot rule out gastritis with NSIAD use.  No infection signs. Unlikely diverticulitis, however has some diverticula on last colonoscopy 2024, other symptoms not characteristic. Unlikely hemorrhoid, she has history of ext hemorrhoid but that would not account for this bleeding. No internal hemorrhoid on last colonoscopy either. - Benign abdomen today on exam, no acute abdomen concerns.  - Order stat labs: hemoglobin, hematocrit, platelet count. She will route to her Oncology Team once resulted. - Advise to avoid ibuprofen , continue Protonix  BID. - Instruct to monitor for persistent/worsening bleeding, pain, anemia symptoms. - Advise ER visit if bleeding continues or symptomatic (fatigue, chest pain).  Chronic pain (low back and neck) Chronic low back and neck pain managed with oxycodone -acetaminophen . Some relief from chiropractic care, currently paused. - Continue oxycodone -acetaminophen  7.5-325 mg PRN, prescribe 60 pills for 30 days. - Document opioid treatment agreement. - Continue chiropractic care as tolerated.        Orders Placed This Encounter  Procedures   CBC with Differential/Platelet   Basic metabolic panel with GFR    No orders of the defined types were placed in this encounter.   Follow up plan: Return if symptoms worsen or fail to improve.   Marsa Officer, DO [facility] 03/29/2024, 3:16 PM

## 2024-03-30 ENCOUNTER — Ambulatory Visit: Payer: Self-pay | Admitting: Family Medicine

## 2024-03-30 DIAGNOSIS — Z9012 Acquired absence of left breast and nipple: Secondary | ICD-10-CM | POA: Diagnosis not present

## 2024-03-30 DIAGNOSIS — E785 Hyperlipidemia, unspecified: Secondary | ICD-10-CM | POA: Diagnosis not present

## 2024-03-30 DIAGNOSIS — K921 Melena: Secondary | ICD-10-CM | POA: Diagnosis not present

## 2024-03-30 DIAGNOSIS — K219 Gastro-esophageal reflux disease without esophagitis: Secondary | ICD-10-CM | POA: Diagnosis not present

## 2024-03-30 DIAGNOSIS — C50112 Malignant neoplasm of central portion of left female breast: Secondary | ICD-10-CM | POA: Diagnosis not present

## 2024-03-30 DIAGNOSIS — D62 Acute posthemorrhagic anemia: Secondary | ICD-10-CM | POA: Diagnosis not present

## 2024-03-30 DIAGNOSIS — I10 Essential (primary) hypertension: Secondary | ICD-10-CM | POA: Diagnosis not present

## 2024-03-30 DIAGNOSIS — K922 Gastrointestinal hemorrhage, unspecified: Secondary | ICD-10-CM | POA: Diagnosis not present

## 2024-03-30 DIAGNOSIS — Z171 Estrogen receptor negative status [ER-]: Secondary | ICD-10-CM | POA: Diagnosis not present

## 2024-03-30 DIAGNOSIS — G4730 Sleep apnea, unspecified: Secondary | ICD-10-CM | POA: Diagnosis not present

## 2024-03-30 DIAGNOSIS — M5417 Radiculopathy, lumbosacral region: Secondary | ICD-10-CM | POA: Diagnosis not present

## 2024-03-30 DIAGNOSIS — D649 Anemia, unspecified: Secondary | ICD-10-CM | POA: Diagnosis not present

## 2024-03-30 DIAGNOSIS — Z9221 Personal history of antineoplastic chemotherapy: Secondary | ICD-10-CM | POA: Diagnosis not present

## 2024-03-30 DIAGNOSIS — Z9884 Bariatric surgery status: Secondary | ICD-10-CM | POA: Diagnosis not present

## 2024-03-30 DIAGNOSIS — R197 Diarrhea, unspecified: Secondary | ICD-10-CM | POA: Diagnosis not present

## 2024-03-31 DIAGNOSIS — K921 Melena: Secondary | ICD-10-CM | POA: Diagnosis not present

## 2024-04-09 DIAGNOSIS — C259 Malignant neoplasm of pancreas, unspecified: Secondary | ICD-10-CM | POA: Diagnosis not present

## 2024-04-11 DIAGNOSIS — C50112 Malignant neoplasm of central portion of left female breast: Secondary | ICD-10-CM | POA: Diagnosis not present

## 2024-04-11 DIAGNOSIS — R110 Nausea: Secondary | ICD-10-CM | POA: Diagnosis not present

## 2024-04-11 DIAGNOSIS — Z171 Estrogen receptor negative status [ER-]: Secondary | ICD-10-CM | POA: Diagnosis not present

## 2024-04-11 DIAGNOSIS — R197 Diarrhea, unspecified: Secondary | ICD-10-CM | POA: Diagnosis not present

## 2024-04-11 DIAGNOSIS — Z5111 Encounter for antineoplastic chemotherapy: Secondary | ICD-10-CM | POA: Diagnosis not present

## 2024-04-11 DIAGNOSIS — K909 Intestinal malabsorption, unspecified: Secondary | ICD-10-CM | POA: Diagnosis not present

## 2024-04-18 DIAGNOSIS — D709 Neutropenia, unspecified: Secondary | ICD-10-CM | POA: Diagnosis not present

## 2024-04-18 DIAGNOSIS — D849 Immunodeficiency, unspecified: Secondary | ICD-10-CM | POA: Diagnosis not present

## 2024-04-18 DIAGNOSIS — C50919 Malignant neoplasm of unspecified site of unspecified female breast: Secondary | ICD-10-CM | POA: Diagnosis not present

## 2024-04-18 DIAGNOSIS — Z87891 Personal history of nicotine dependence: Secondary | ICD-10-CM | POA: Diagnosis not present

## 2024-04-18 DIAGNOSIS — Z9221 Personal history of antineoplastic chemotherapy: Secondary | ICD-10-CM | POA: Diagnosis not present

## 2024-04-18 DIAGNOSIS — E785 Hyperlipidemia, unspecified: Secondary | ICD-10-CM | POA: Diagnosis not present

## 2024-04-18 DIAGNOSIS — R509 Fever, unspecified: Secondary | ICD-10-CM | POA: Diagnosis not present

## 2024-04-18 DIAGNOSIS — I10 Essential (primary) hypertension: Secondary | ICD-10-CM | POA: Diagnosis not present

## 2024-04-18 DIAGNOSIS — R21 Rash and other nonspecific skin eruption: Secondary | ICD-10-CM | POA: Diagnosis not present

## 2024-04-18 DIAGNOSIS — Z79899 Other long term (current) drug therapy: Secondary | ICD-10-CM | POA: Diagnosis not present

## 2024-04-18 DIAGNOSIS — K219 Gastro-esophageal reflux disease without esophagitis: Secondary | ICD-10-CM | POA: Diagnosis not present

## 2024-04-18 DIAGNOSIS — Z7982 Long term (current) use of aspirin: Secondary | ICD-10-CM | POA: Diagnosis not present

## 2024-04-18 DIAGNOSIS — Z853 Personal history of malignant neoplasm of breast: Secondary | ICD-10-CM | POA: Diagnosis not present

## 2024-04-27 ENCOUNTER — Other Ambulatory Visit: Payer: Self-pay | Admitting: Family Medicine

## 2024-04-27 DIAGNOSIS — I10 Essential (primary) hypertension: Secondary | ICD-10-CM

## 2024-04-28 ENCOUNTER — Encounter: Payer: Self-pay | Admitting: Family Medicine

## 2024-04-28 DIAGNOSIS — Z9013 Acquired absence of bilateral breasts and nipples: Secondary | ICD-10-CM

## 2024-04-28 DIAGNOSIS — M5020 Other cervical disc displacement, unspecified cervical region: Secondary | ICD-10-CM

## 2024-04-28 DIAGNOSIS — M47816 Spondylosis without myelopathy or radiculopathy, lumbar region: Secondary | ICD-10-CM

## 2024-04-28 MED ORDER — OXYCODONE-ACETAMINOPHEN 7.5-325 MG PO TABS: 1.0000 | ORAL_TABLET | ORAL | 0 refills | Status: DC | PRN

## 2024-04-28 NOTE — Telephone Encounter (Signed)
 Requested Prescriptions  Pending Prescriptions Disp Refills   amLODipine  (NORVASC ) 5 MG tablet [Pharmacy Med Name: AMLODIPINE  BESYLATE 5MG  TABLETS] 90 tablet 1    Sig: TAKE 1 TABLET(5 MG) BY MOUTH DAILY     Cardiovascular: Calcium Channel Blockers 2 Passed - 04/28/2024  2:17 PM      Passed - Last BP in normal range    BP Readings from Last 1 Encounters:  03/29/24 134/76         Passed - Last Heart Rate in normal range    Pulse Readings from Last 1 Encounters:  03/29/24 66         Passed - Valid encounter within last 6 months    Recent Outpatient Visits           1 month ago Rectal bleeding   [place] [facility] [place] Marsa PARAS, DO   2 months ago Upper respiratory tract infection, unspecified type   [facility] [place] Marsa PARAS, DO   5 months ago Chronic neck pain   [place] [facility] [place], [place] PARAS, [place]

## 2024-05-28 LAB — CBC WITH DIFFERENTIAL/PLATELET
Absolute Lymphocytes: 2349 {cells}/uL (ref 850–3900)
Absolute Monocytes: 1784 {cells}/uL — ABNORMAL HIGH (ref 200–950)
Basophils Absolute: 25 {cells}/uL (ref 0–200)
Basophils Relative: 0.2 %
Eosinophils Absolute: 0 {cells}/uL — ABNORMAL LOW (ref 15–500)
Eosinophils Relative: 0 %
HCT: 38.1 % (ref 35.0–45.0)
Hemoglobin: 12.1 g/dL (ref 11.7–15.5)
MCH: 29.6 pg (ref 27.0–33.0)
MCHC: 31.8 g/dL — ABNORMAL LOW (ref 32.0–36.0)
MCV: 93.2 fL (ref 80.0–100.0)
MPV: 10.5 fL (ref 7.5–12.5)
Monocytes Relative: 14.5 %
Neutro Abs: 8143 {cells}/uL — ABNORMAL HIGH (ref 1500–7800)
Neutrophils Relative %: 66.2 %
Platelets: 249 Thousand/uL (ref 140–400)
RBC: 4.09 Million/uL (ref 3.80–5.10)
RDW: 12.6 % (ref 11.0–15.0)
Total Lymphocyte: 19.1 %
WBC: 12.3 Thousand/uL — ABNORMAL HIGH (ref 3.8–10.8)

## 2024-05-28 LAB — BASIC METABOLIC PANEL WITH GFR
BUN/Creatinine Ratio: 13 (calc) (ref 6–22)
BUN: 14 mg/dL (ref 7–25)
CO2: 31 mmol/L (ref 20–32)
Calcium: 9.1 mg/dL (ref 8.6–10.2)
Chloride: 105 mmol/L (ref 98–110)
Creat: 1.06 mg/dL — ABNORMAL HIGH (ref 0.50–0.99)
Glucose, Bld: 88 mg/dL (ref 65–99)
Potassium: 4.2 mmol/L (ref 3.5–5.3)
Sodium: 141 mmol/L (ref 135–146)
eGFR: 65 mL/min/1.73m2 (ref >=60)

## 2024-06-02 ENCOUNTER — Ambulatory Visit (INDEPENDENT_AMBULATORY_CARE_PROVIDER_SITE_OTHER): Admitting: Vascular Surgery

## 2024-06-16 ENCOUNTER — Ambulatory Visit (INDEPENDENT_AMBULATORY_CARE_PROVIDER_SITE_OTHER): Admitting: Vascular Surgery

## 2024-06-18 ENCOUNTER — Telehealth

## 2024-06-19 ENCOUNTER — Ambulatory Visit: Payer: Self-pay

## 2024-06-19 NOTE — Telephone Encounter (Signed)
 FYI Only or Action Required?: FYI only for provider: appointment scheduled on 06/20/24.  Patient was last seen in primary care on 03/29/2024 by [place] Marsa PARAS, DO.  Called Nurse Triage reporting Dental Pain and Facial Swelling.  Symptoms began yesterday.  Interventions attempted: OTC medications: Advil , RX. Amoxiclav.  Symptoms are: unchanged.  Triage Disposition: See HCP Within 4 Hours (Or PCP Triage)  Patient/caregiver understands and will follow disposition?: Yes       Copied from CRM [id number]. Topic: Clinical - Red Word Triage >> Jun 19, 2024  3:39 PM Fonda T wrote: Red Word that prompted transfer to Nurse Triage: Pt is calling, reports she is having increased right side facial swelling and pain.  Pt reports was seen at urgent care and prescribed an anabiotic, but swelling is not getting any better, it is worsening.  Pt also reports she just completed chemo about 2 weeks ago, and not sure if she ha an infection.  Pt requesting an appt. Reason for Disposition  Toothache  Answer Assessment - Initial Assessment Questions 1. ONSET: When did the swelling start? (e.g., minutes, hours, days)     X 1 day   2. LOCATION: What part of the face is swollen? (e.g., cheek, entire face, jaw joint area, under jaw)      Right side cheek, jaw area  3. SEVERITY: How swollen is it?     Swelling is moderate    5. PAIN: Is the swelling painful to touch? If Yes, ask: How painful is it?   (Scale 0-10; mild, moderate or severe)     Moderate   6. FEVER: Do you have a fever? If Yes, ask: What is it, how was it measured, and when did it start?      No   7. CAUSE: What do you think is causing the face swelling?     Unsure   8. NEW MEDICINES: Have there been any new medicines started recently?      Amoxiclav prescribed in ED yesterday  9. RECURRENT SYMPTOM: Have you had face swelling before? If Yes, ask: When was the last time? What happened that  time?     No    10. OTHER SYMPTOMS: Do you have any other symptoms? (e.g., leg swelling, toothache)       No     Patient called in to triage with complaints of Facial swelling related to possible sinus infection or dental pain, she is unsure. This has been ongoing for x 1 day. The patient stated she was seen in ED yesterday, and prescribed Amoxiclav for dental pain, patient is not sure if that is the cause of the swelling. She did mention she is having dental pain in her teeth, but that may be due to a possible sinus infection.  For home care, the patient is taking OTC Advil    Appointment scheduled for further evaluation at [facility] office; She agrees with the plan of care, and will reach out if symptoms worsen or persist.  Protocols used: Face Swelling-A-AH

## 2024-06-20 ENCOUNTER — Ambulatory Visit: Admitting: Student

## 2024-06-20 ENCOUNTER — Encounter: Payer: Self-pay | Admitting: Student

## 2024-06-20 VITALS — BP 128/84 | HR 98 | Temp 98.4°F | Ht 67.0 in | Wt 246.0 lb

## 2024-06-20 DIAGNOSIS — R220 Localized swelling, mass and lump, head: Secondary | ICD-10-CM | POA: Diagnosis not present

## 2024-06-20 NOTE — Progress Notes (Signed)
 "  Established Patient Office Visit  Subjective   Patient ID: Suzanne Wilcox, female    DOB: 1975/11/22  Age: 48 y.o. MRN: [id number]  Chief Complaint  Patient presents with   Facial Swelling    Suzanne Wilcox is a 48 y.o. person with medical hx listed below who presents today for right cheek swelling for the past 3 days. Initially started around the Right sinus with associated ear pressure and rhinorrhea and right sided tooth pain. Since then swelling has gradually moved laterally and downwards. Went to UC on 12/21 and told she had sinus infection. Was taking expired Augmentin . Then to ED 12/22(yesterday) for facial swelling discuss possible sinus infection vs dental infection. No signs of abscess were found per patient and did not get imaging. Was prescribed Augmentin . Feels swelling in improving a little and pain has improved. Cheek does not as hot today. Was given addition oxycodone  5 mg for breakthrough pain.  Denies fevers, chills,  sore throat, dyspnea, cough, drooling, hearing changing, gum swelling, or bleeding.  Patient Active Problem List   Diagnosis Date Noted   Chronic low back pain 12/16/2023   Status post laparoscopic cholecystectomy 09/16/2023   BRCA2 gene mutation positive 06/11/2023   Chronic neck pain 04/02/2023   Lymphedema 03/02/2023   Varicose veins of leg with pain, right 03/02/2023   Encounter for screening colonoscopy 09/04/2022   Essential hypertension 10/29/2021   Bilateral lower extremity edema 10/29/2021   Class 3 severe obesity due to excess calories without serious comorbidity with body mass index (BMI) of 50.0 to 59.9 in adult [facility]) 10/29/2021   OSA (obstructive sleep apnea) 10/29/2021   Attention deficit hyperactivity disorder (ADHD), combined type 10/29/2021   GAD (generalized anxiety disorder) 10/29/2021   Excessive daytime sleepiness 12/16/2020   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   Herniated cervical intervertebral disc 06/28/2017   Fatty (change  of) liver, not elsewhere classified 12/27/2016   Bipolar 1 disorder (HCC) 05/11/2012   OCD (obsessive compulsive disorder) 05/11/2012   Substance abuse (HCC) 05/11/2012      ROS Refer to HPI    Objective:     Outpatient Encounter Medications as of 06/20/2024  Medication Sig   acetaminophen  (TYLENOL ) 500 MG tablet Take 1 tablet (500 mg total) by mouth every 6 (six) hours as needed.   acetaminophen -caffeine (EXCEDRIN TENSION HEADACHE) 500-65 MG TABS per tablet Take 1 tablet by mouth daily. May take another 2 tablet dose throughout the day as needed for a headache   ALPRAZolam (XANAX) 1 MG tablet Take 1 mg by mouth 3 (three) times daily as needed for anxiety.   amLODipine  (NORVASC ) 5 MG tablet TAKE 1 TABLET(5 MG) BY MOUTH DAILY   amphetamine-dextroamphetamine (ADDERALL) 30 MG tablet Take 30 mg by mouth daily. May take a second 30 mg dose in the afternoon as needed for attention   buPROPion  (WELLBUTRIN ) 100 MG tablet Take 100-200 mg by mouth See admin instructions. Take 200 mg in the morning and 100 mg in the evening   Calcium Citrate-Vitamin D (CALCIUM CITRATE CHEWY BITE PO) Take 1 tablet by mouth 3 (three) times daily.   CAPLYTA 42 MG capsule Take 42 mg by mouth at bedtime.   Coenzyme Q10 (COQ-10 PO) Take 1 tablet by mouth daily.   cyclobenzaprine  (FLEXERIL ) 10 MG tablet Take 1 tablet (10 mg total) by mouth 3 (three) times daily as needed for muscle spasms.   dexamethasone  (DECADRON ) 4 MG tablet Take 8 mg by mouth daily.   docusate sodium  (  COLACE) 100 MG capsule Take 1 capsule (100 mg total) by mouth 2 (two) times daily as needed.   estradiol  (CLIMARA ) 0.1 mg/24hr patch Place 1 patch (0.1 mg total) onto the skin once a week.   furosemide  (LASIX ) 40 MG tablet Take 40 mg by mouth as needed for edema ([place] as needed for swelling in legs/ankles/feet).   gabapentin  (NEURONTIN ) 300 MG capsule Take by mouth.   hydrOXYzine (VISTARIL) 25 MG capsule Take 25 mg by mouth 3 (three) times daily.    Iron-Vitamin C (VITRON-C) 65-125 MG TABS Take 1 tablet by mouth 2 (two) times daily.   Multiple Vitamins-Minerals (BARIATRIC MULTIVITAMINS/IRON) CAPS Take 1 capsule by mouth daily.   nystatin (MYCOSTATIN/NYSTOP) powder Apply 1 Application topically 2 (two) times daily as needed (rash).   Omega-3 Fatty Acids (FISH OIL PO) Take 1 capsule by mouth daily.   ondansetron  (ZOFRAN ) 8 MG tablet Take 8 mg by mouth every 8 (eight) hours as needed for nausea or vomiting. Pt has not started medication yet   oxyCODONE -acetaminophen  (PERCOCET) 7.5-325 MG tablet Take 1 tablet by mouth every 4 (four) hours as needed for severe pain (pain score 7-10).   pantoprazole  (PROTONIX ) 40 MG tablet Take 1 tablet (40 mg total) by mouth 2 (two) times daily before a meal.   Phenylephrine -Acetaminophen  (TYLENOL  SINUS CONGESTION/PAIN PO) Take 2 tablets by mouth daily as needed (congestion).   prochlorperazine (COMPAZINE) 10 MG tablet Take 10 mg by mouth every 6 (six) hours as needed.   No facility-administered encounter medications on file as of 06/20/2024.    BP 128/84   Pulse 98   Temp 98.4 F (36.9 C) (Oral)   Ht 5' 7 (1.702 m)   Wt 246 lb (111.6 kg)   LMP 07/12/2023   SpO2 98%   BMI 38.53 kg/m  BP Readings from Last 3 Encounters:  06/20/24 128/84  03/29/24 134/76  03/03/24 (!) 162/99    Physical Exam Constitutional:      Appearance: Normal appearance.  HENT:     Head: Normocephalic and atraumatic.     Right Ear: Tympanic membrane and ear canal normal.     Left Ear: Tympanic membrane and ear canal normal.     Nose:     Right Turbinates: Swollen.     Left Turbinates: Not swollen.     Right Sinus: Maxillary sinus tenderness present. No frontal sinus tenderness.     Left Sinus: No maxillary sinus tenderness or frontal sinus tenderness.     Mouth/Throat:     Mouth: Mucous membranes are moist. No oral lesions.     Dentition: Dental caries present. No gingival swelling or dental abscesses.     Palate: No  mass.     Pharynx: Oropharynx is clear. No pharyngeal swelling or posterior oropharyngeal erythema.     Tonsils: No tonsillar exudate or tonsillar abscesses.  Cardiovascular:     Rate and Rhythm: Normal rate and regular rhythm.  Pulmonary:     Effort: Pulmonary effort is normal.     Breath sounds: No rhonchi or rales.  Abdominal:     General: Abdomen is flat. Bowel sounds are normal. There is no distension.     Palpations: Abdomen is soft.     Tenderness: There is no abdominal tenderness.  Musculoskeletal:        General: Normal range of motion.     Right lower leg: No edema.     Left lower leg: No edema.  Skin:    General: Skin is warm  and dry.     Capillary Refill: Capillary refill takes less than 2 seconds.  Neurological:     General: No focal deficit present.     Mental Status: She is alert and oriented to person, place, and time.  Psychiatric:        Mood and Affect: Mood normal.        Behavior: Behavior normal.        03/29/2024    3:13 PM 03/23/2024    3:08 PM 02/07/2024    3:01 PM  Depression screen PHQ 2/9  Decreased Interest 2 0 0  Down, Depressed, Hopeless 0 0 0  PHQ - 2 Score 2 0 0  Altered sleeping 1    Tired, decreased energy 2    Change in appetite 2    Feeling bad or failure about yourself  2    Trouble concentrating 3    Moving slowly or fidgety/restless 0    Suicidal thoughts 0    PHQ-9 Score 12     Difficult doing work/chores Very difficult       Data saved with a previous flowsheet row definition       03/29/2024    3:14 PM 01/10/2024    3:07 PM 12/08/2023   10:24 AM 11/23/2023   10:33 AM  GAD 7 : Generalized Anxiety Score  Nervous, Anxious, on Edge 2 2 2 3   Control/stop worrying 1 2 2 2   Worry too much - different things 1 2 2 3   Trouble relaxing 3 1 1 2   Restless 1 1 2 3   Easily annoyed or irritable 2 1 2 3   Afraid - awful might happen 1 1 2 1   Total GAD 7 Score 11 10 13 17   Anxiety Difficulty Very difficult Very difficult Very difficult  Very difficult    No results found for any visits on 06/20/24.    The 10-year ASCVD risk score (Arnett DK, et al., 2019) is: 0.7%    Assessment & Plan:  Facial swelling Patient with facial swelling for the past 3 days. Mild erythema of the right cheek with mild amount of swelling, seems to be moving dependently. On enlargement in salivary glands. No Tongue, lip or throat swelling or abscess on exam. She is afebrile and protecting airway, suspect dental infection. Continue Augmentin  as symptoms appear to be improving, will defer imaging for now.  ED precautions given for fever, dyspnea, swelling  of the tongue/throat/ lips, worsening pain. Recommend she make appointment with PCP for follow up in January if sx are not improving. She has dental appointment on 12/29.   Harlene Saddler, MD "

## 2024-06-21 NOTE — ED Provider Notes (Signed)
 Emergency Department Provider Note    ED Clinical Impression    Final diagnoses:  Dental infection (Primary)        Impression, Medical Decision Making, Progress Notes and Critical Care    Impression, Differential Diagnosis and Plan of Care  Medical Decision Making 48 year old female with a history of recent completion of chemotherapy for breast cancer and prior gastric bypass presented with acute right maxillary swelling and severe pain radiating to the ear, associated with dental tenderness but no significant nasal congestion, fever, or dysphagia. She has a history of chronic low back pain managed with oxycodone  and reported that her usual dose did not relieve her current pain. Examination revealed right maxillary swelling, tenderness of multiple teeth, no fluctuance, normal gum appearance, and a normal nasal exam. There was no evidence of airway compromise or abscess formation on exam.  Differential diagnosis includes, but is not limited to: - Acute dental infection (periapical abscess): Acute dental infection was favored due to localized swelling, severe pain, dental tenderness, and absence of significant sinus findings, with the likely source being a periapical abscess. - Sinusitis: Sinusitis was considered but is less likely given the normal nasal exam and lack of significant congestion or sinus tenderness. - Dental abscess with abscess formation: Progression to a frank abscess was considered but is less likely as there was no fluctuance or evidence of abscess on exam.  Acute dental infection with severe pain - Prescribed updated Augmentin  for dental infection - Administered 15 mg oxycodone  for pain management - Reassessed pain control after administration of pain medication - Advised against the use of Advil  due to gastric bypass history     Independent Interpretation of Studies  I have independently interpreted the following studies: EKG:  X-ray(s):  CT/MRI(s):   Ultrasound(s):   Discussion of Management with other Providers or Support Staff  I discussed the management of this patient with the: Admitting provider:  Consultant(s):  Radiologist:  ED Pharmacist: Case Management/Social Work:  Other:   Considerations Regarding Disposition/Escalation of Care and Critical Care  Indications for observation/admission (or consideration of observation/admission) and/or appropriateness for outpatient management:  Patient/Family/Caregiver Discussions: Prescription Drugs Provided or Considered But Not Given:  Social Determinants of Health which significantly affected care:  Social Drivers of Health   Food Insecurity: No Food Insecurity (02/14/2024)   Hunger Vital Sign    Worried About Running Out of Food in the Last Year: Never true    Ran Out of Food in the Last Year: Never true  Recent Concern: Food Insecurity - Food Insecurity Present (02/02/2024)   Received from [facility]   Hunger Vital Sign    Within the past 12 months, you worried that your food would run out before you got the money to buy more.: Sometimes true    Within the past 12 months, the food you bought just didn't last and you didn't have money to get more.: Never true  Tobacco Use: Medium Risk (06/18/2024)   Patient History    Smoking Tobacco Use: Former    Smokeless Tobacco Use: Never    Passive Exposure: Not on file  Transportation Needs: No Transportation Needs (02/14/2024)   PRAPARE - Administrator, Civil Service (Medical): No    Lack of Transportation (Non-Medical): No  Alcohol Use: Not At Risk (02/04/2024)   Alcohol Use    How often do you have a drink containing alcohol?: Never    How many drinks containing alcohol do you have on a typical day  when you are drinking?: 1 - 2    How often do you have 5 or more drinks on one occasion?: Never  Housing: Low Risk (02/14/2024)   Housing    Within the past 12 months, have you ever stayed: outside, in a car,  in a tent, in an overnight shelter, or temporarily in someone else's home (i.e. couch-surfing)?: No    Are you worried about losing your housing?: No  Physical Activity: Insufficiently Active (02/02/2024)   Received from [facility]   Exercise Vital Sign    On average, how many days per week do you engage in moderate to strenuous exercise (like a brisk walk)?: 1 day    On average, how many minutes do you engage in exercise at this level?: 10 min  Utilities: High Risk (02/14/2024)   Utilities    Within the past 12 months, have you been unable to get utilities (heat, electricity) when it was really needed?: Yes  Stress: Stress Concern Present (02/02/2024)   Received from [facility] - Occupational Stress Questionnaire    Do you feel stress - tense, restless, nervous, or anxious, or unable to sleep at night because your mind is troubled all the time - these days?: Very much  Interpersonal Safety: Not At Risk (06/18/2024)   Interpersonal Safety    Unsafe Where You Currently Live: No    Physically Hurt by Anyone: No    Abused by Anyone: No  Substance Use: Low Risk (02/04/2024)   Substance Use    In the past year, how often have you used prescription drugs for non-medical reasons?: Never    In the past year, how often have you used illegal drugs?: Never    In the past year, have you used any substance for non-medical reasons?: No  Intimate Partner Violence: Not At Risk (12/08/2023)   Received from [facility]   Humiliation, Afraid, Rape, and Kick questionnaire    Within the last year, have you been afraid of your partner or ex-partner?: No    Within the last year, have you been humiliated or emotionally abused in other ways by your partner or ex-partner?: No    Within the last year, have you been kicked, hit, slapped, or otherwise physically hurt by your partner or ex-partner?: No    Within the last year, have you been raped or forced to have  any kind of sexual activity by your partner or ex-partner?: No  Social Connections: Moderately Integrated (02/02/2024)   Received from [facility]   Social Connection and Isolation Panel    In a typical week, how many times do you talk on the phone with family, friends, or neighbors?: Three times a week    How often do you get together with friends or relatives?: Once a week    How often do you attend church or religious services?: 1 to 4 times per year    Do you belong to any clubs or organizations such as church groups, unions, fraternal or athletic groups, or school groups?: Yes    How often do you attend meetings of the clubs or organizations you belong to?: More than 4 times per year    Are you married, widowed, divorced, separated, never married, or living with a partner?: Divorced  Physicist, Medical Strain: High Risk (02/14/2024)   Overall Financial Resource Strain (CARDIA)    Difficulty of Paying Living Expenses: Hard  Health Literacy: Low Risk (02/14/2024)   Health  Literacy    : Never  Internet Connectivity: No Internet connectivity concern identified (02/14/2024)   Internet Connectivity    Do you have access to internet services: Yes    How do you connect to the internet: Personal Device at home    Is your internet connection strong enough for you to watch video on your device without major problems?: Yes    Do you have enough data to get through the month?: Yes    Does at least one of the devices have a camera that you can use for video chat?: Yes     Additional Progress Notes   Portions of this record have been created using Dragon dictation software. Dictation errors have been sought, but may not have been identified and corrected.  See chart and resident provider documentation for details.  ____________________________________________      History     Reason for Visit Sinus Pain   HPI  Suzanne Wilcox is a 48 y.o. female  History of Present  Illness Suzanne Wilcox is a 48 year old female who presents with facial swelling and dental pain.  She reports worsening facial swelling and severe facial pain since headaches started on Thursday, with swelling and pain developing by Saturday morning. Pain radiates to her ear, limits her ability to eat, and has no associated hearing changes. She denies congestion, difficulty swallowing, or fever.  She recently completed chemotherapy for breast cancer and is not on maintenance therapy. She started leftover amoxiclav at home. She has been taking Advil  for pain despite prior gastric bypass surgery. She also takes oxycodone  7.5 mg for back pain, which has not relieved her current dental pain even when taken every 4 hours as needed.     Outside Historian(s)    External Records Reviewed    Past Medical History[1]  Problem List[2]  Past Surgical History[3]  No current facility-administered medications for this encounter.  Current Outpatient Medications:    acetaminophen -caffeine (EXCEDRIN TENSION HEADACHE) 500-65 mg Tab, Take 1 tablet by mouth., Disp: , Rfl:    amlodipine  (NORVASC ) 5 MG tablet, Take 1 tablet (5 mg total) by mouth before bedtime. TAKE 1 TABLET(5 MG) BY MOUTH DAILY., Disp: , Rfl:    amoxicillin -clavulanate (AUGMENTIN ) 875-125 mg per tablet, Take 1 tablet by mouth two (2) times a day for 7 days., Disp: 14 tablet, Rfl: 0   buPROPion  (WELLBUTRIN ) 100 MG tablet, Take 1-2 tablets (100-200 mg total) by mouth. Takes 2 in am and 1 in pm, Disp: , Rfl:    CAPLYTA 42 mg cap, Take 1 capsule by mouth., Disp: , Rfl:    dexAMETHasone  (DECADRON ) 4 MG tablet, Take 2 tablets (8 mg total) by mouth daily. Take 8 mg with food the day before and the morning of chemotherapy., Disp: 24 tablet, Rfl: 0   dexAMETHasone  (DECADRON ) 4 MG tablet, Take 2 tablets (8 mg total) by mouth daily starting 05/05/24 for 3 days and then Take 2 tablets (8 mg total) by mouth daily in the morning on days 2, 3 and 4  after chemotherapy. Take with food., Disp: 12 tablet, Rfl: 0   dextroamphetamine-amphetamine (ADDERALL) 30 mg tablet, Take 1 tablet (30 mg total) by mouth., Disp: , Rfl:    diphenoxylate-atropine (LOMOTIL) 2.5-0.025 mg per tablet, Take 1 tablet by mouth four (4) times a day as needed for diarrhea., Disp: 30 tablet, Rfl: 1   docusate sodium  (COLACE) 100 MG capsule, Take 1 capsule (100 mg total) by mouth., Disp: , Rfl:  estradiol  (CLIMARA ) 0.1 mg/24 hr, Place 0.1 mg on the skin., Disp: , Rfl:    fish oil-omega-3 fatty acids 300-1,000 mg capsule, Take 1 capsule (1 g total) by mouth daily., Disp: , Rfl:    gabapentin  (NEURONTIN ) 300 MG capsule, Take 1 capsule (300 mg total) by mouth Three (3) times a day., Disp: 270 capsule, Rfl: 0   hydrocortisone 1 % cream, Apply topically two (2) times a day., Disp: 453.6 g, Rfl: 11   hydrOXYzine (VISTARIL) 25 MG capsule, Take 1 capsule (25 mg total) by mouth Three (3) times a day., Disp: , Rfl:    multivitamin (TAB-A-VITE/THERAGRAN) per tablet, Take 1 tablet by mouth before bedtime., Disp: , Rfl:    ondansetron  (ZOFRAN ) 8 MG tablet, Take 1 tablet (8 mg total) by mouth every eight (8) hours as needed for nausea. Take 1 tablet by mouth twice daily on days 2 and 3 of each chemotherapy cycle. May take up to 1 tablet every 8 hours as needed for nausea, but do not take on day 1 of cycles., Disp: 60 tablet, Rfl: 3   oxyCODONE  (ROXICODONE ) 5 MG immediate release tablet, Take 1 tablet (5 mg total) by mouth every eight (8) hours as needed for pain for up to 5 days., Disp: 6 tablet, Rfl: 0   pantoprazole  (PROTONIX ) 40 MG tablet, Take 1 tablet (40 mg total) by mouth., Disp: , Rfl:    pegfilgrastim-cbqv (UDENYCA) 6 mg/0.6 mL injection, Inject 0.6 mL (6 mg total) under the skin every twenty-one (21) days. 24-48 hr after chemotherapy, Disp: 0.6 mL, Rfl: 3   prochlorperazine (COMPAZINE) 10 MG tablet, Take 1 tablet (10 mg total) by mouth every six (6) hours as needed  for nausea., Disp: 30 tablet, Rfl: 3  Allergies Wound dressings, Adhesive, and Pregabalin   Family History[4]  Social History Short Social History[5]  Review of Systems  Constitutional: Negative for fever. Eyes: Negative for visual changes. ENT: Negative for sore throat. Cardiovascular: Negative for chest pain. Respiratory: Negative for shortness of breath. Gastrointestinal: Negative for abdominal pain, vomiting or diarrhea.    Physical Exam    ED Triage Vitals [06/18/24 2044]  Enc Vitals Group     BP (!) 158/88     Pulse 86     SpO2 Pulse      Resp 18     Temp 36.4 C (97.5 F)     Temp Source Temporal     SpO2 98 %     Weight (!) 108.9 kg (240 lb)     Height 1.715 m (5' 7.5)     Head Circumference      Peak Flow      Pain Score      Pain Loc      Pain Education      Exclude from Growth Chart     Constitutional: Alert and oriented. in no distress. Eyes: Conjunctivae are normal. ENT some swelling noted to upper right cheek overlying maxilla area, some poor dentition and tender to palpation molars, no drainage of the gum line of fluctance      Head: Normocephalic and atraumatic.      Nose: No congestion.      Mouth/Throat: Mucous membranes are moist.      Neck: No stridor. Hematological/Lymphatic/Immunilogical: No cervical lymphadenopathy. Cardiovascular: Normal rate, regular rhythm. Normal and symmetric distal pulses are present in all extremities. Respiratory: Normal respiratory effort. Breath sounds are normal. Gastrointestinal: Soft and nontender. There is no CVA tenderness. Musculoskeletal: Normal range of motion  in all extremities.      Right lower leg: No tenderness or edema.      Left lower leg: No tenderness or edema. Neurologic: Normal speech and language. No gross focal neurologic deficits are appreciated. Skin: Skin is warm, dry and intact. No rash noted. Psychiatric: Mood and affect are normal. Speech and behavior are normal.           [1] Past Medical History: Diagnosis Date   Allergies    Anemia    Anxiety    Arthritis    Autism spectrum disorder (HHS-HCC) 1985   Level 1   Bipolar disorder (CMS-HCC)    BRCA positive 06/09/24   BRCA2   Chronic pain disorder 2015   Sciatica and neck pain. Bulging discs   Depression    GERD (gastroesophageal reflux disease)    Hyperlipidemia    Hypertension    PTSD (post-traumatic stress disorder) 2011   Sleep apnea   [2] Patient Active Problem List Diagnosis   BRCA2 gene mutation positive in female   Malignant neoplasm of central portion of left breast in female, estrogen receptor negative    (CMS-HCC)   History of Roux-en-Y gastric bypass   Lumbosacral radiculopathy   Anxiety   Lower GI bleed   Acute blood loss anemia  [3] Past Surgical History: Procedure Laterality Date   CESAREAN SECTION  2011   CESAREAN SECTION  2015   CESAREAN SECTION  2017   CHOLECYSTECTOMY  04/2017   COLONOSCOPY  07/2022   All clear   DILATION AND CURETTAGE OF UTERUS  2010   DILATION AND EVACUATION  2005   GALLBLADDER SURGERY  04/2017   GASTRIC BYPASS  03/2023   HYSTERECTOMY  08/11/2023   MASTECTOMY  02/04/2024   Prophylactic bilateral   OOPHORECTOMY  08/11/23   OTHER SURGICAL HISTORY     3 x cesarean, gallbladder, gastric bypass, total hysterectomy, hematoma   PR BX/REMV,LYMPH NODE,DEEP AXILL Left 02/17/2024   Procedure: BX/EXC LYMPH NODE; OPEN, DEEP AXILRY NODE;  Surgeon: Iva Cay Murray, DO;  Location: OR [facility];  Service: Surgical Oncology Breast   PR IMPLNT BIO IMPLNT FOR SOFT TISSUE REINFORCEMENT Bilateral 02/04/2024   Procedure: IMPLANTATION BIOLOGIC IMPLANT(EG, ACELLULAR DERMAL MATRIX) FOR SOFT TISSUE REINFORCEMENT(EG, BREAST, TRUNK);  Surgeon: Vernida Pauline Pollock, MD;  Location: OR [facility];  Service: Plastics   PR INSERTION BREAST IMPLANT SAME DAY OF MASTECTOMY Bilateral 02/04/2024   Procedure: INSERTION OF BREAST IMPLANT ON  SAME DAY OF MASTECTOMY (IE, IMMEDIATE);  Surgeon: Vernida Pauline Pollock, MD;  Location: OR [facility];  Service: Plastics   PR INTRAOPERATIVE SENTINEL LYMPH NODE ID W DYE INJECTION Left 02/17/2024   Procedure: INTRAOPERATIVE IDENTIFICATION SENTINEL LYMPH NODE(S) INCLUDE INJECTION NON-RADIOACTIVE DYE, WHEN PERFORMED;  Surgeon: Iva Cay Murray, DO;  Location: OR [facility];  Service: Surgical Oncology Breast   PR MASTECTOMY, SIMPLE, COMPLETE Bilateral 02/04/2024   Procedure: BILATERAL MASTECTOMY, SIMPLE, COMPLETE;  Surgeon: Iva Cay Murray, DO;  Location: OR [facility];  Service: Surgical Oncology Breast   PR TISSUE EXPANDER PLACEMENT BREAST RECONSTRUCTION Bilateral 02/04/2024   Procedure: TISSUE EXPANDER PLACEMENT IN BREAST RECONSTRUCTION, INCLUDING SUBSEQUENT EXPANSION(S);  Surgeon: Vernida Pauline Pollock, MD;  Location: OR [facility];  Service: Plastics   UPPER GASTROINTESTINAL ENDOSCOPY  2023  [4] History reviewed. No pertinent family history. [5] Social History Tobacco Use   Smoking status: Former    Current packs/day: 0.00    Average packs/day: 1 pack/day for 16.0 years (16.0 ttl pk-yrs)    Types: Cigarettes  Start date: 33    Quit date: 2010    Years since quitting: 15.9   Smokeless tobacco: Never  Vaping Use   Vaping status: Never Used  Substance Use Topics   Alcohol use: Yes    Alcohol/week: 1.0 standard drink of alcohol    Types: 1 Shots of liquor per week    Comment: SOCIAL DRINKER   Drug use: Never   Gerrie Hussar, MD 06/21/24 2053

## 2024-06-23 ENCOUNTER — Other Ambulatory Visit: Payer: Self-pay | Admitting: Student

## 2024-06-23 ENCOUNTER — Encounter: Payer: Self-pay | Admitting: Family Medicine

## 2024-06-23 ENCOUNTER — Encounter: Payer: Self-pay | Admitting: Student

## 2024-06-23 DIAGNOSIS — Z9013 Acquired absence of bilateral breasts and nipples: Secondary | ICD-10-CM

## 2024-06-23 DIAGNOSIS — M47816 Spondylosis without myelopathy or radiculopathy, lumbar region: Secondary | ICD-10-CM

## 2024-06-23 DIAGNOSIS — M5020 Other cervical disc displacement, unspecified cervical region: Secondary | ICD-10-CM

## 2024-06-23 MED ORDER — OXYCODONE-ACETAMINOPHEN 7.5-325 MG PO TABS: 1.0000 | ORAL_TABLET | ORAL | 0 refills | Status: AC | PRN

## 2024-06-23 MED ORDER — OXYCODONE HCL 5 MG PO TABS: 5.0000 mg | ORAL_TABLET | ORAL | 0 refills | Status: AC | PRN

## 2024-06-23 NOTE — Telephone Encounter (Signed)
 Please review and advise patient.   JM

## 2024-06-27 ENCOUNTER — Encounter: Payer: Self-pay | Admitting: Family Medicine

## 2024-06-27 ENCOUNTER — Ambulatory Visit: Payer: Self-pay

## 2024-06-27 ENCOUNTER — Encounter (INDEPENDENT_AMBULATORY_CARE_PROVIDER_SITE_OTHER): Payer: Self-pay

## 2024-06-27 ENCOUNTER — Ambulatory Visit (INDEPENDENT_AMBULATORY_CARE_PROVIDER_SITE_OTHER): Admitting: Vascular Surgery

## 2024-06-27 NOTE — Telephone Encounter (Signed)
"  Spoke to patient, appointment scheduled.   "

## 2024-06-27 NOTE — Telephone Encounter (Signed)
 FYI Only or Action Required?: Action required by provider: request for appointment, clinical question for provider, and update on patient condition.  Patient was last seen in primary care on 06/20/2024 by Lemon Raisin, MD.  Called Nurse Triage reporting Facial Pain.  Symptoms began several weeks ago.  Interventions attempted: Prescription medications: augmentin ; roxicodone  and Rest, hydration, or home remedies.  Symptoms are: unchanged.  Triage Disposition: See Physician Within 24 Hours  Patient/caregiver understands and will follow disposition?: No, wishes to speak with PCP   Copied from CRM #[id number]. Topic: Appointments - Appointment Scheduling >> Jun 27, 2024  9:44 AM Suzanne Wilcox wrote: Pt is calling has pain due to sinus pt had a rx for antibiotic but is done the rx    Reason for Disposition  [1] Taking antibiotic > 72 hours (3 days) AND [2] sinus pain not improved  Answer Assessment - Initial Assessment Questions Pt called in to request another round of abx or xrays. Pt states that she has completed augmentin  and facial swelling has subsided but sinus and facial pain is persisting. Pt was seen by UC 12/21 for sinus pressure and provider denied visualization of fluid. ED 12/22 for a possible dental infection; rx roxicodone  5mg  and augmentin . 12/23 office visit for continued pain. 12/29 pt saw her dentist to r/o abscess; provider referred back to PCP for sinus eval. Pt reports she has an expired script of augmentin  from a previous visit and is unsure if she should take that. Discussed she should hold off on taking rx until she hears from PCP. Discussed no appts with PCP until 07/04/24; pt refused to schedule as she needs sooner appt. Reassured her I would send update to PCP. Pt can be contacted at [phone].     1. ANTIBIOTIC: What antibiotic are you taking? How many times a day?     Pt has completed augmentin  at this time; rx from 12/22 ED   2. ONSET: When was the  antibiotic started?     12/222  3. PAIN: How bad is the pain?   (Scale 0-10; or none, mild, moderate or severe)     Currently 4-5/10  4. FEVER: Do you have a fever? If Yes, ask: What is it, how was it measured, and when did it start?      No   5. SYMPTOMS: Are there any other symptoms you're concerned about? If Yes, ask: When did it start?     Sinus pressure and pain. Pt worried that swelling will return as it has since subsided but pt states pain feels similar to when originally flared 2 weeks ago  Protocols used: Sinus Infection on Antibiotic Follow-up Call-[facility]

## 2024-06-28 ENCOUNTER — Encounter: Payer: Self-pay | Admitting: Family Medicine

## 2024-06-28 ENCOUNTER — Ambulatory Visit (INDEPENDENT_AMBULATORY_CARE_PROVIDER_SITE_OTHER): Admitting: Family Medicine

## 2024-06-28 VITALS — BP 124/82 | HR 91 | Ht 67.0 in | Wt 231.1 lb

## 2024-06-28 DIAGNOSIS — R220 Localized swelling, mass and lump, head: Secondary | ICD-10-CM | POA: Diagnosis not present

## 2024-06-28 DIAGNOSIS — R519 Headache, unspecified: Secondary | ICD-10-CM

## 2024-06-28 DIAGNOSIS — K0889 Other specified disorders of teeth and supporting structures: Secondary | ICD-10-CM | POA: Diagnosis not present

## 2024-06-28 MED ORDER — PREDNISONE 20 MG PO TABS: ORAL_TABLET | ORAL | 0 refills | Status: AC

## 2024-06-28 MED ORDER — DOXYCYCLINE HYCLATE 100 MG PO TABS: 100.0000 mg | ORAL_TABLET | Freq: Two times a day (BID) | ORAL | 0 refills | Status: DC

## 2024-06-28 NOTE — Progress Notes (Signed)
 "  Subjective:    Patient ID: Suzanne Wilcox, female    DOB: [birth year], 48 y.o.   MRN: [id number]  Suzanne Wilcox is a 48 y.o. female presenting on 06/28/2024 for Sinus Problem  Patient presents for a same day appointment.  HPI  Discussed the use of AI scribe software for clinical note transcription with the patient, who gave verbal consent to proceed.  History of Present Illness   Suzanne Wilcox is a 48 year old female with a history of being immunocompromised who presents with facial swelling and pain.  Facial swelling and pain - Significant swelling and pain localized primarily to the right side of the face since June 18, 2024 - Initial onset at right sinus and face, described with sharp, intense pain - Swelling spread downward from initial site - Pain exacerbated by pressure on the area - Pain radiates to right eye and teeth mostly - Associated with sinus pressure and earaches - No fever during this period - Pain severe enough to interfere with eating and sleeping, causing significant distress  Disease trajectory and prior interventions - Visited urgent care on June 18, 2024 for worsening symptoms - Presented to emergency room on June 19, 2024 due to increased pain and swelling. No CT or imaging done. Given Augmentin  antibiotic and meds for pain. - Dental evaluation on June 26, 2024 with dental x-rays ruled out dental infection or abscess - Swelling and pain have persisted but subsided somewhat after restarting Augmentin   Today is improved, reduced swelling. Still discomfort.  Medication use and response - Restarted expired Augmentin  at onset of symptoms, with partial improvement - Currently taking Augmentin  (expired) - No known allergies to antibiotics - Has taken decongestants without relief - Previously used prednisone  for back pain, but not for current symptoms  Immunocompromised status - Immunocompromised  - Followed by [facility] for breast cancer, on  chemotherapy           03/29/2024    3:13 PM 03/23/2024    3:08 PM 02/07/2024    3:01 PM  Depression screen PHQ 2/9  Decreased Interest 2 0 0  Down, Depressed, Hopeless 0 0 0  PHQ - 2 Score 2 0 0  Altered sleeping 1    Tired, decreased energy 2    Change in appetite 2    Feeling bad or failure about yourself  2    Trouble concentrating 3    Moving slowly or fidgety/restless 0    Suicidal thoughts 0    PHQ-9 Score 12     Difficult doing work/chores Very difficult       Data saved with a previous flowsheet row definition       03/29/2024    3:14 PM 01/10/2024    3:07 PM 12/08/2023   10:24 AM 11/23/2023   10:33 AM  GAD 7 : Generalized Anxiety Score  Nervous, Anxious, on Edge 2 2 2 3   Control/stop worrying 1 2 2 2   Worry too much - different things 1 2 2 3   Trouble relaxing 3 1 1 2   Restless 1 1 2 3   Easily annoyed or irritable 2 1 2 3   Afraid - awful might happen 1 1 2 1   Total GAD 7 Score 11 10 13 17   Anxiety Difficulty Very difficult Very difficult Very difficult Very difficult    Social History[1]  Review of Systems Per HPI unless specifically indicated above     Objective:    BP 124/82 (BP Location: Right Arm,  Patient Position: Sitting, Cuff Size: Large)   Pulse 91   Ht 5' 7 (1.702 m)   Wt 231 lb 2 oz (104.8 kg)   LMP 07/12/2023   SpO2 96%   BMI 36.20 kg/m   Wt Readings from Last 3 Encounters:  06/28/24 231 lb 2 oz (104.8 kg)  06/20/24 246 lb (111.6 kg)  03/29/24 240 lb (108.9 kg)    Physical Exam Vitals and nursing note reviewed.  Constitutional:      General: She is not in acute distress.    Appearance: Normal appearance. She is well-developed. She is not diaphoretic.     Comments: Well-appearing, comfortable, cooperative  HENT:     Head: Normocephalic and atraumatic.     Comments: R side facial swelling cheek maxillary area into bridge of nose R side    Right Ear: Ear canal and external ear normal. There is no impacted cerumen.     Left Ear:  Tympanic membrane, ear canal and external ear normal. There is no impacted cerumen.     Ears:     Comments: R TM some clear effusion and fullness, no erythema.    Nose: No congestion.     Comments: Turbinate edema    Mouth/Throat:     Mouth: Mucous membranes are moist.     Pharynx: No oropharyngeal exudate.  Eyes:     General:        Right eye: No discharge.        Left eye: No discharge.     Conjunctiva/sclera: Conjunctivae normal.  Cardiovascular:     Rate and Rhythm: Normal rate.  Pulmonary:     Effort: Pulmonary effort is normal.  Skin:    General: Skin is warm and dry.     Findings: No erythema or rash.  Neurological:     Mental Status: She is alert and oriented to person, place, and time.  Psychiatric:        Mood and Affect: Mood normal.        Behavior: Behavior normal.        Thought Content: Thought content normal.     Comments: Well groomed, good eye contact, normal speech and thoughts     Results for orders placed or performed in visit on 03/29/24  CBC with Differential/Platelet   Collection Time: 03/29/24  3:44 PM  Result Value Ref Range   WBC 12.3 (H) 3.8 - 10.8 Thousand/uL   RBC 4.09 3.80 - 5.10 Million/uL   Hemoglobin 12.1 11.7 - 15.5 g/dL   HCT 61.8 64.9 - 54.9 %   MCV 93.2 80.0 - 100.0 fL   MCH 29.6 27.0 - 33.0 pg   MCHC 31.8 (L) 32.0 - 36.0 g/dL   RDW 87.3 88.9 - 84.9 %   Platelets 249 140 - 400 Thousand/uL   MPV 10.5 7.5 - 12.5 fL   Neutro Abs 8,143 (H) 1,500 - 7,800 cells/uL   Absolute Lymphocytes 2,349 850 - 3,900 cells/uL   Absolute Monocytes 1,784 (H) 200 - 950 cells/uL   Eosinophils Absolute 0 (L) 15 - 500 cells/uL   Basophils Absolute 25 0 - 200 cells/uL   Neutrophils Relative % 66.2 %   Total Lymphocyte 19.1 %   Monocytes Relative 14.5 %   Eosinophils Relative 0.0 %   Basophils Relative 0.2 %   Smear Review    Basic metabolic panel with GFR   Collection Time: 03/29/24  3:44 PM  Result Value Ref Range   Glucose, Bld 88 65 - 99  mg/dL    BUN 14 7 - 25 mg/dL   Creat 8.93 (H) 9.49 - 0.99 mg/dL   eGFR 65 > OR = 60 fO/fpw/8.26f7   BUN/Creatinine Ratio 13 6 - 22 (calc)   Sodium 141 135 - 146 mmol/L   Potassium 4.2 3.5 - 5.3 mmol/L   Chloride 105 98 - 110 mmol/L   CO2 31 20 - 32 mmol/L   Calcium 9.1 8.6 - 10.2 mg/dL      Assessment & Plan:   Problem List Items Addressed This Visit   None Visit Diagnoses       Facial swelling    -  Primary   Relevant Medications   predniSONE  (DELTASONE ) 20 MG tablet   doxycycline (VIBRA-TABS) 100 MG tablet   Other Relevant Orders   CT MAXILLOFACIAL W CONTRAST     Facial pain       Relevant Orders   CT MAXILLOFACIAL W CONTRAST     Pain, dental           Facial swelling and pain with dental and sinus involvement Facial swelling and pain on the right side with sinus involvement. Ongoing issue for past 10 days, has seen urgent care, ED and office visit follow-up at other location Sinusitis suspected; dental infection ruled out by Dentist already with X-rays. No fever. Sinus edematous. Pain mostly radiating into dental maxillary region likely from swelling and sinus pressure. No lymphadenopathy.  Note she is on chemotherapy w/ [facility] for breast cancer. Oncology suggested immune booster due to immunocompromised state.  Temporary relief on Augmentin  course. On pain medication currently  Today improved swelling but still concerned at this time. History not suggestive of focal abscess. No fluctuance identified.  - Prescribed doxycycline for 10 days, twice daily. - Prescribed prednisone  taper for 7 days. - Ordered CT scan of facial and sinus region. - Advised to stay upright after taking doxycycline and avoid dairy or calcium supplements within 2 hours of administration.  Immunocompromised state Due to previous chemotherapy. Oncology team consulted for potential immune booster to increase white blood cell count. - Coordinate with oncology team regarding potential immune booster.      Advised return precautions, if severe worsening symptoms or new concerns fever etc then she has been advised to seek care promptly at hospital ED for expedited imaging, as outpatient imaging CT will take 2-3+ days to arrange and schedule  due to New years holiday.   Orders Placed This Encounter  Procedures   CT MAXILLOFACIAL W CONTRAST    Standing Status:   Future    Expiration Date:   06/28/2025    Scheduling Instructions:     Okay to schedule within 1 week    If indicated for the ordered procedure, I authorize the administration of contrast media per Radiology protocol:   Yes    Is patient pregnant?:   No    Preferred imaging location?:   [facility]    Meds ordered this encounter  Medications   predniSONE  (DELTASONE ) 20 MG tablet    Sig: Take daily with food. Start with 60mg  (3 pills) x 2 days, then reduce to 40mg  (2 pills) x 2 days, then 20mg  (1 pill) x 3 days    Dispense:  13 tablet    Refill:  0   doxycycline (VIBRA-TABS) 100 MG tablet    Sig: Take 1 tablet (100 mg total) by mouth 2 (two) times daily. For 10 days. Take with full glass of water, stay upright 30 min after  taking.    Dispense:  20 tablet    Refill:  0    Follow up plan: Return if symptoms worsen or fail to improve.  Marsa Officer, DO [facility] 06/28/2024, 4:47 PM     [1]  Social History Tobacco Use   Smoking status: Former    Current packs/day: 0.00    Average packs/day: 1 pack/day for 18.0 years (18.0 ttl pk-yrs)    Types: Cigarettes    Start date: 04/30/1991    Quit date: 04/29/2009    Years since quitting: 15.1   Smokeless tobacco: Never  Vaping Use   Vaping status: Never Used  Substance Use Topics   Alcohol use: Yes    Comment: OCCASIONALLY   Drug use: No   "

## 2024-06-28 NOTE — Patient Instructions (Addendum)
 Thank you for coming to the office today.  Uncertain exact diagnosis still. At this point, seems mostly swelling without obvious infection.  Prednisone  taper  Start taking Doxycycline antibiotic 100mg  twice daily for 10 days. Take with full glass of water and stay upright for at least 30 min after taking, may be seated or standing, but should NOT lay down. This is just a safety precaution, if this medicine does not go all the way down throat well it could cause some burning discomfort to throat and esophagus. Also NOTE - do not take medicine within 2 hours (before or after) consuming dairy or foods / vitamins containing high calcium or iron.  CT Facial Sinus is ordered, likely Friday 1/2 or early next week. Stay tuned.  Please schedule a Follow-up Appointment to: Return if symptoms worsen or fail to improve.  If you have any other questions or concerns, please feel free to call the office or send a message through MyChart. You may also schedule an earlier appointment if necessary.  Additionally, you may be receiving a survey about your experience at our office within a few days to 1 week by e-mail or mail. We value your feedback.  Marsa Officer, DO [facility], [place]

## 2024-06-30 ENCOUNTER — Ambulatory Visit: Payer: Self-pay | Admitting: Internal Medicine

## 2024-06-30 ENCOUNTER — Encounter: Payer: Self-pay | Admitting: Family Medicine

## 2024-06-30 ENCOUNTER — Ambulatory Visit
Admission: RE | Admit: 2024-06-30 | Discharge: 2024-06-30 | Disposition: A | Discharge status: Home / Self Care | Source: Ambulatory Visit | Attending: Family Medicine | Admitting: Family Medicine

## 2024-06-30 DIAGNOSIS — R220 Localized swelling, mass and lump, head: Secondary | ICD-10-CM | POA: Diagnosis present

## 2024-06-30 DIAGNOSIS — R519 Headache, unspecified: Secondary | ICD-10-CM | POA: Insufficient documentation

## 2024-06-30 MED ORDER — CLINDAMYCIN HCL 300 MG PO CAPS: 300.0000 mg | ORAL_CAPSULE | Freq: Three times a day (TID) | ORAL | 0 refills | Status: DC

## 2024-06-30 MED ORDER — IOHEXOL 300 MG/ML  SOLN
75.0000 mL | Freq: Once | INTRAMUSCULAR | Status: AC | PRN
  Administered 2024-06-30: 75 mL via INTRAVENOUS

## 2024-06-30 NOTE — Addendum Note (Signed)
 Addended by: ANTONETTE ANGELINE ORN on: 06/30/2024 10:29 AM   Modules accepted: Orders

## 2024-07-21 ENCOUNTER — Telehealth: Payer: Self-pay

## 2024-07-21 ENCOUNTER — Encounter: Admitting: Family Medicine

## 2024-07-21 DIAGNOSIS — K0889 Other specified disorders of teeth and supporting structures: Secondary | ICD-10-CM | POA: Diagnosis not present

## 2024-07-21 DIAGNOSIS — K047 Periapical abscess without sinus: Secondary | ICD-10-CM

## 2024-07-21 DIAGNOSIS — R519 Headache, unspecified: Secondary | ICD-10-CM

## 2024-07-21 MED ORDER — OXYCODONE HCL 5 MG PO TABS: 5.0000 mg | ORAL_TABLET | Freq: Three times a day (TID) | ORAL | 0 refills | Status: AC | PRN

## 2024-07-21 MED ORDER — AMOXICILLIN-POT CLAVULANATE 875-125 MG PO TABS: 1.0000 | ORAL_TABLET | Freq: Two times a day (BID) | ORAL | 0 refills | Status: AC

## 2024-07-21 NOTE — Addendum Note (Signed)
 Addended by: EDMAN MARSA PARAS on: 07/21/2024 04:32 PM   Modules accepted: Orders

## 2024-07-21 NOTE — Telephone Encounter (Signed)
 See MyChart message. Rx has been sent. Closing encounter  Marsa Officer, DO [facility] 07/21/2024, 4:38 PM

## 2024-07-21 NOTE — Telephone Encounter (Signed)
 Copied from CRM #[id number]. Topic: Clinical - Medical Advice >> Jul 21, 2024 12:28 PM Hadassah PARAS wrote: Reason for CRM: Pt is f/u on req for antibiotics. Pt tooth is swelling and Dentist is not in office. Needing medication before storm. Please advise pt on #[phone]  or message back on myChart

## 2024-07-21 NOTE — Telephone Encounter (Signed)
Please see the MyChart message reply(ies) for my assessment and plan.    This patient gave consent for this Medical Advice Message and is aware that it may result in a bill to their insurance company, as well as the possibility of receiving a bill for a co-payment or deductible. They are an established patient, but are not seeking medical advice exclusively about a problem treated during an in person or video visit in the last seven days. I did not recommend an in person or video visit within seven days of my reply.    I spent a total of 10 minutes cumulative time within 7 days through MyChart messaging.  Hosea Hanawalt, DO   

## 2024-08-16 ENCOUNTER — Encounter
